# Patient Record
Sex: Male | Born: 1965
Health system: Southern US, Community
[De-identification: ages and names within clinical notes are randomized; demographics above are authoritative.]

## PROBLEM LIST (undated history)

## (undated) DIAGNOSIS — C7A8 Other malignant neuroendocrine tumors: Secondary | ICD-10-CM

## (undated) DIAGNOSIS — I219 Acute myocardial infarction, unspecified: Secondary | ICD-10-CM

## (undated) DIAGNOSIS — I2510 Atherosclerotic heart disease of native coronary artery without angina pectoris: Secondary | ICD-10-CM

## (undated) DIAGNOSIS — I10 Essential (primary) hypertension: Secondary | ICD-10-CM

## (undated) DIAGNOSIS — E785 Hyperlipidemia, unspecified: Secondary | ICD-10-CM

## (undated) HISTORY — PX: WISDOM TOOTH EXTRACTION: SHX21

## (undated) HISTORY — PX: TONSILLECTOMY: SUR1361

---
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
## 2007-06-05 HISTORY — PX: KNEE SURGERY: SHX244

## 2018-05-12 DIAGNOSIS — D485 Neoplasm of uncertain behavior of skin: Secondary | ICD-10-CM | POA: Diagnosis not present

## 2018-05-21 DIAGNOSIS — R198 Other specified symptoms and signs involving the digestive system and abdomen: Secondary | ICD-10-CM | POA: Diagnosis not present

## 2018-05-21 DIAGNOSIS — R140 Abdominal distension (gaseous): Secondary | ICD-10-CM | POA: Diagnosis not present

## 2018-06-05 ENCOUNTER — Emergency Department (HOSPITAL_COMMUNITY): Payer: Federal, State, Local not specified - PPO

## 2018-06-05 ENCOUNTER — Encounter (HOSPITAL_COMMUNITY): Payer: Self-pay | Admitting: Cardiology

## 2018-06-05 ENCOUNTER — Other Ambulatory Visit: Payer: Self-pay

## 2018-06-05 ENCOUNTER — Inpatient Hospital Stay (HOSPITAL_COMMUNITY)
Admission: EM | Admit: 2018-06-05 | Discharge: 2018-06-10 | DRG: 246 | Disposition: A | Discharge status: Home / Self Care | Payer: Federal, State, Local not specified - PPO | Attending: Cardiology | Admitting: Cardiology

## 2018-06-05 DIAGNOSIS — Z9861 Coronary angioplasty status: Secondary | ICD-10-CM

## 2018-06-05 DIAGNOSIS — I25118 Atherosclerotic heart disease of native coronary artery with other forms of angina pectoris: Secondary | ICD-10-CM | POA: Diagnosis present

## 2018-06-05 DIAGNOSIS — N183 Chronic kidney disease, stage 3 (moderate): Secondary | ICD-10-CM | POA: Diagnosis not present

## 2018-06-05 DIAGNOSIS — Z8249 Family history of ischemic heart disease and other diseases of the circulatory system: Secondary | ICD-10-CM | POA: Diagnosis not present

## 2018-06-05 DIAGNOSIS — Z833 Family history of diabetes mellitus: Secondary | ICD-10-CM

## 2018-06-05 DIAGNOSIS — E785 Hyperlipidemia, unspecified: Secondary | ICD-10-CM | POA: Diagnosis present

## 2018-06-05 DIAGNOSIS — I10 Essential (primary) hypertension: Secondary | ICD-10-CM | POA: Diagnosis present

## 2018-06-05 DIAGNOSIS — I214 Non-ST elevation (NSTEMI) myocardial infarction: Secondary | ICD-10-CM

## 2018-06-05 DIAGNOSIS — Z880 Allergy status to penicillin: Secondary | ICD-10-CM | POA: Diagnosis not present

## 2018-06-05 DIAGNOSIS — Z79899 Other long term (current) drug therapy: Secondary | ICD-10-CM

## 2018-06-05 DIAGNOSIS — R740 Nonspecific elevation of levels of transaminase and lactic acid dehydrogenase [LDH]: Secondary | ICD-10-CM | POA: Diagnosis not present

## 2018-06-05 DIAGNOSIS — I129 Hypertensive chronic kidney disease with stage 1 through stage 4 chronic kidney disease, or unspecified chronic kidney disease: Secondary | ICD-10-CM | POA: Diagnosis present

## 2018-06-05 DIAGNOSIS — N179 Acute kidney failure, unspecified: Secondary | ICD-10-CM | POA: Diagnosis not present

## 2018-06-05 DIAGNOSIS — R748 Abnormal levels of other serum enzymes: Secondary | ICD-10-CM | POA: Diagnosis not present

## 2018-06-05 DIAGNOSIS — Z955 Presence of coronary angioplasty implant and graft: Secondary | ICD-10-CM

## 2018-06-05 DIAGNOSIS — R945 Abnormal results of liver function studies: Secondary | ICD-10-CM | POA: Diagnosis not present

## 2018-06-05 DIAGNOSIS — I2510 Atherosclerotic heart disease of native coronary artery without angina pectoris: Secondary | ICD-10-CM | POA: Diagnosis not present

## 2018-06-05 DIAGNOSIS — R918 Other nonspecific abnormal finding of lung field: Secondary | ICD-10-CM | POA: Diagnosis not present

## 2018-06-05 HISTORY — DX: Acute myocardial infarction, unspecified: I21.9

## 2018-06-05 HISTORY — DX: Hyperlipidemia, unspecified: E78.5

## 2018-06-05 HISTORY — DX: Essential (primary) hypertension: I10

## 2018-06-05 HISTORY — DX: Atherosclerotic heart disease of native coronary artery without angina pectoris: I25.10

## 2018-06-05 LAB — CBC
HCT: 45.7 % (ref 39.0–52.0)
Hemoglobin: 15.7 g/dL (ref 13.0–17.0)
MCH: 31.2 pg (ref 26.0–34.0)
MCHC: 34.4 g/dL (ref 30.0–36.0)
MCV: 90.9 fL (ref 80.0–100.0)
Platelets: 237 10*3/uL (ref 150–400)
RBC: 5.03 MIL/uL (ref 4.22–5.81)
RDW: 12.6 % (ref 11.5–15.5)
WBC: 8.3 10*3/uL (ref 4.0–10.5)
nRBC: 0 % (ref 0.0–0.2)

## 2018-06-05 LAB — HEPATIC FUNCTION PANEL
ALT: 48 U/L — ABNORMAL HIGH (ref 0–44)
AST: 62 U/L — ABNORMAL HIGH (ref 15–41)
Albumin: 4.7 g/dL (ref 3.5–5.0)
Alkaline Phosphatase: 44 U/L (ref 38–126)
Bilirubin, Direct: 0.2 mg/dL (ref 0.0–0.2)
Indirect Bilirubin: 0.5 mg/dL (ref 0.3–0.9)
Total Bilirubin: 0.7 mg/dL (ref 0.3–1.2)
Total Protein: 8.3 g/dL — ABNORMAL HIGH (ref 6.5–8.1)

## 2018-06-05 LAB — BASIC METABOLIC PANEL
Anion gap: 12 (ref 5–15)
BUN: 19 mg/dL (ref 6–20)
CO2: 24 mmol/L (ref 22–32)
Calcium: 9.6 mg/dL (ref 8.9–10.3)
Chloride: 101 mmol/L (ref 98–111)
Creatinine, Ser: 1.51 mg/dL — ABNORMAL HIGH (ref 0.61–1.24)
GFR calc Af Amer: 60 mL/min — ABNORMAL LOW (ref >60)
GFR calc non Af Amer: 52 mL/min — ABNORMAL LOW (ref >60)
Glucose, Bld: 96 mg/dL (ref 70–99)
Potassium: 4.1 mmol/L (ref 3.5–5.1)
SODIUM: 137 mmol/L (ref 135–145)

## 2018-06-05 LAB — APTT: aPTT: 35 seconds (ref 24–36)

## 2018-06-05 LAB — LIPASE, BLOOD: Lipase: 64 U/L — ABNORMAL HIGH (ref 11–51)

## 2018-06-05 LAB — I-STAT TROPONIN, ED: Troponin i, poc: 0.11 ng/mL (ref 0.00–0.08)

## 2018-06-05 LAB — PROTIME-INR
INR: 0.84
Prothrombin Time: 11.4 seconds (ref 11.4–15.2)

## 2018-06-05 LAB — HEPARIN LEVEL (UNFRACTIONATED): Heparin Unfractionated: 0.32 IU/mL (ref 0.30–0.70)

## 2018-06-05 IMAGING — CR DG CHEST 2V
2 series · 2 of 2 positions shown · non-contrast
Comparison: None.

CLINICAL DATA: Onset of mid sternal chest discomfort 5 6 days ago
associated with decreased oral intake. Symptoms are made worse with
exertion. There is no shortness of breath.

EXAM:
CHEST - 2 VIEW

[w chest pa]
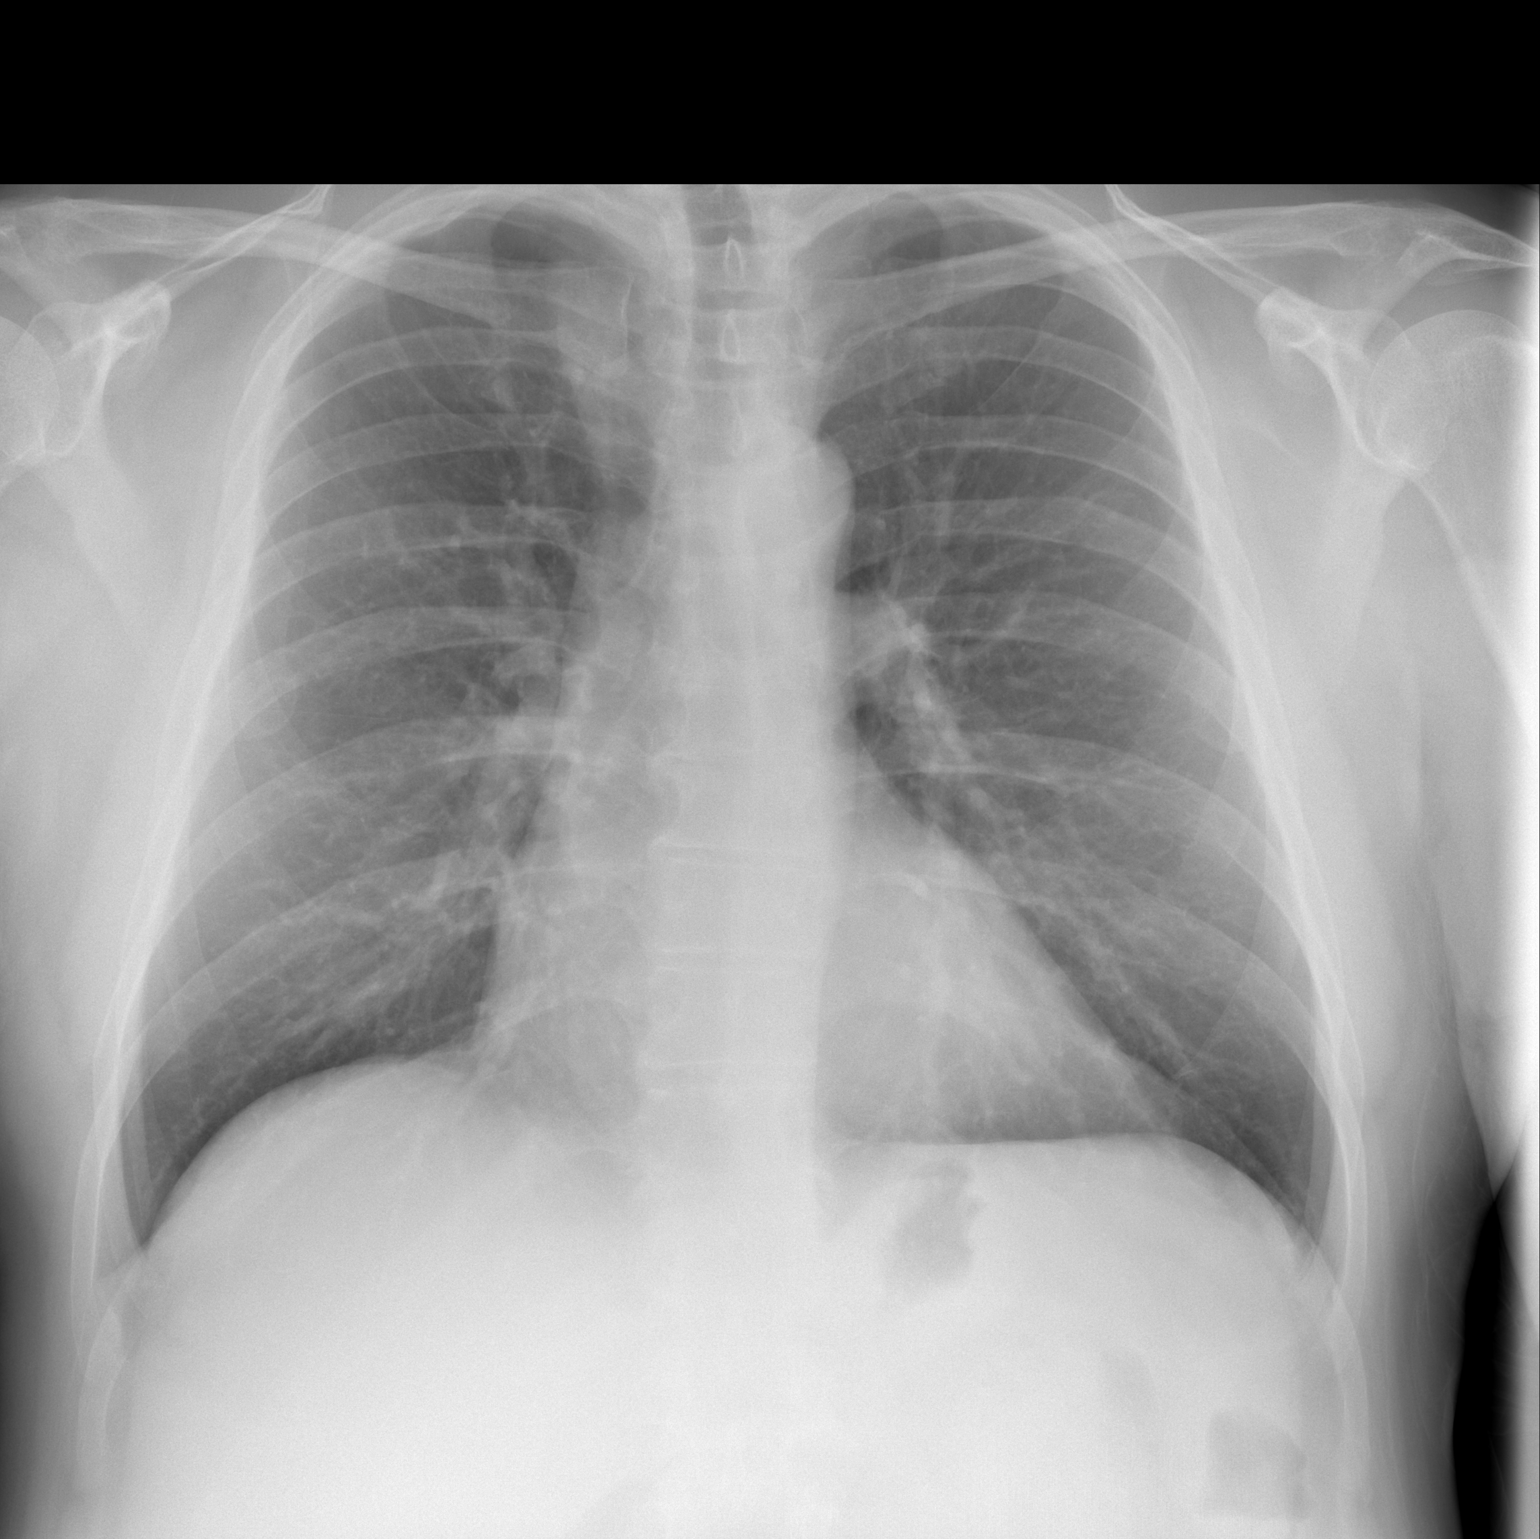

[w chest lat]
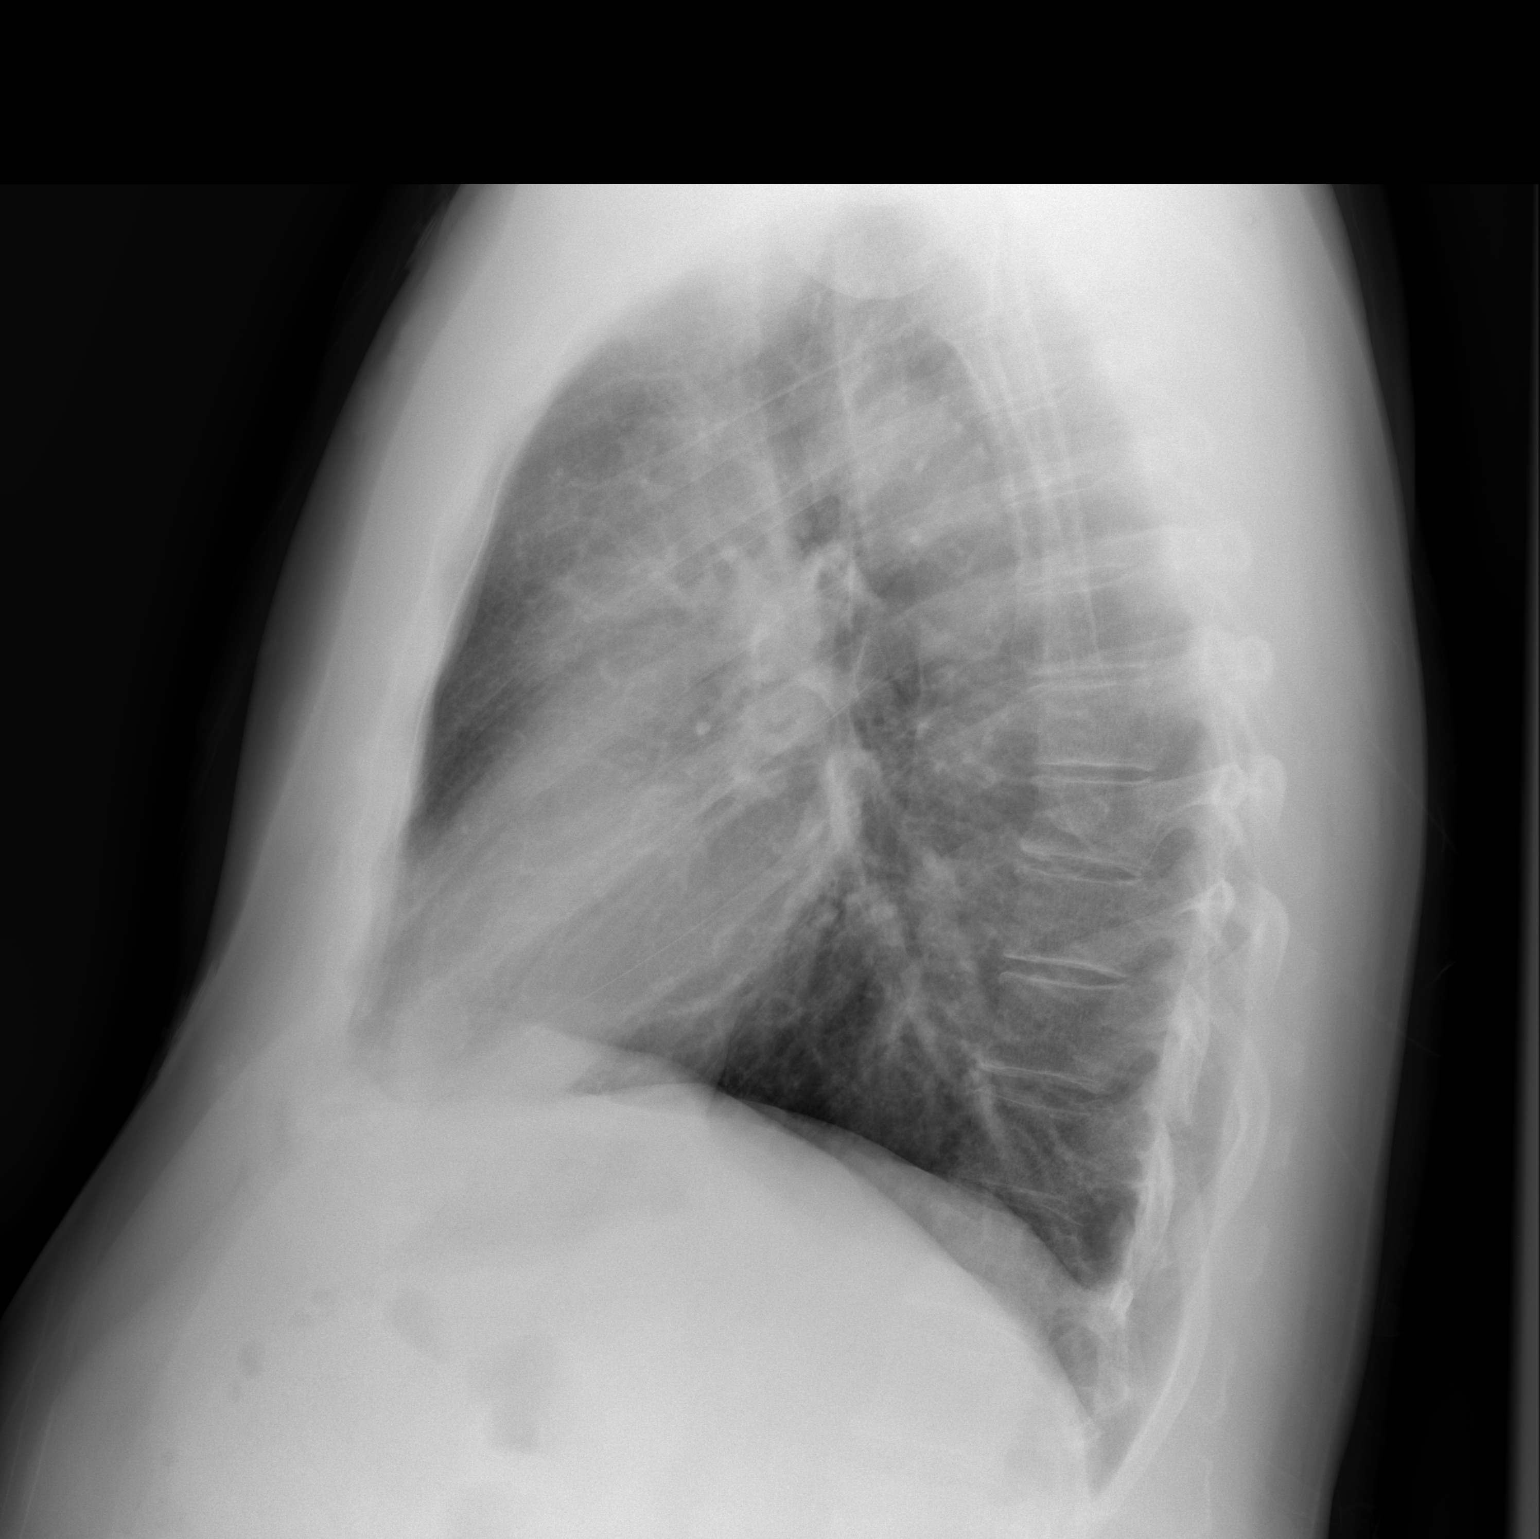

[2 of 2 positions shown; findings below may reference images not displayed]

FINDINGS: The lungs are well-expanded. There is no focal infiltrate. There is
no pleural effusion. The heart and pulmonary vascularity are normal.
The mediastinum is normal in width. The bony thorax exhibits no
acute abnormality.
IMPRESSION: Mild hyperinflation consistent with reactive airway disease or COPD.
No alveolar pneumonia nor CHF.

## 2018-06-05 MED ORDER — TICAGRELOR 90 MG PO TABS
180.0000 mg | ORAL_TABLET | Freq: Once | ORAL | Status: AC
  Administered 2018-06-05: 180 mg via ORAL
  Filled 2018-06-05: qty 2

## 2018-06-05 MED ORDER — SODIUM CHLORIDE 0.9 % IV SOLN: 250.0000 mL | INTRAVENOUS | Status: DC | PRN

## 2018-06-05 MED ORDER — HEPARIN (PORCINE) 25000 UT/250ML-% IV SOLN
1200.0000 [IU]/h | INTRAVENOUS | Status: DC
  Administered 2018-06-05 – 2018-06-06 (×2): 1200 [IU]/h via INTRAVENOUS
  Filled 2018-06-05 (×2): qty 250

## 2018-06-05 MED ORDER — HEPARIN BOLUS VIA INFUSION
4000.0000 [IU] | Freq: Once | INTRAVENOUS | Status: AC
  Administered 2018-06-05: 4000 [IU] via INTRAVENOUS
  Filled 2018-06-05: qty 4000

## 2018-06-05 MED ORDER — TICAGRELOR 90 MG PO TABS
90.0000 mg | ORAL_TABLET | Freq: Two times a day (BID) | ORAL | Status: DC
  Administered 2018-06-06 – 2018-06-10 (×8): 90 mg via ORAL
  Filled 2018-06-05 (×8): qty 1

## 2018-06-05 MED ORDER — ASPIRIN 81 MG PO CHEW
81.0000 mg | CHEWABLE_TABLET | Freq: Every day | ORAL | Status: DC
  Administered 2018-06-06 – 2018-06-10 (×5): 81 mg via ORAL
  Filled 2018-06-05 (×5): qty 1

## 2018-06-05 MED ORDER — NITROGLYCERIN 0.4 MG SL SUBL
0.4000 mg | SUBLINGUAL_TABLET | SUBLINGUAL | Status: AC | PRN
  Administered 2018-06-05 (×3): 0.4 mg via SUBLINGUAL
  Filled 2018-06-05: qty 1

## 2018-06-05 MED ORDER — ONDANSETRON HCL 4 MG/2ML IJ SOLN: 4.0000 mg | Freq: Four times a day (QID) | INTRAMUSCULAR | Status: DC | PRN

## 2018-06-05 MED ORDER — ASPIRIN 81 MG PO CHEW
81.0000 mg | CHEWABLE_TABLET | ORAL | Status: AC
  Administered 2018-06-06: 81 mg via ORAL
  Filled 2018-06-05: qty 1

## 2018-06-05 MED ORDER — AMLODIPINE BESYLATE 5 MG PO TABS
5.0000 mg | ORAL_TABLET | Freq: Every day | ORAL | Status: DC
  Administered 2018-06-05 – 2018-06-10 (×6): 5 mg via ORAL
  Filled 2018-06-05 (×6): qty 1

## 2018-06-05 MED ORDER — NITROGLYCERIN 0.4 MG SL SUBL: 0.4000 mg | SUBLINGUAL_TABLET | SUBLINGUAL | Status: DC | PRN

## 2018-06-05 MED ORDER — METOPROLOL TARTRATE 25 MG PO TABS
25.0000 mg | ORAL_TABLET | Freq: Two times a day (BID) | ORAL | Status: DC
  Administered 2018-06-05 – 2018-06-09 (×9): 25 mg via ORAL
  Filled 2018-06-05 (×9): qty 1

## 2018-06-05 MED ORDER — SODIUM CHLORIDE 0.9 % IV SOLN: INTRAVENOUS | Status: DC

## 2018-06-05 MED ORDER — ASPIRIN 325 MG PO TABS
325.0000 mg | ORAL_TABLET | Freq: Once | ORAL | Status: AC
  Administered 2018-06-05: 325 mg via ORAL
  Filled 2018-06-05: qty 1

## 2018-06-05 MED ORDER — SODIUM CHLORIDE 0.9% FLUSH: 3.0000 mL | INTRAVENOUS | Status: DC | PRN

## 2018-06-05 MED ORDER — SODIUM CHLORIDE 0.9 % IV SOLN
INTRAVENOUS | Status: AC
  Administered 2018-06-05 – 2018-06-06 (×3): via INTRAVENOUS

## 2018-06-05 MED ORDER — ACETAMINOPHEN 325 MG PO TABS: 650.0000 mg | ORAL_TABLET | ORAL | Status: DC | PRN

## 2018-06-05 MED ORDER — ATORVASTATIN CALCIUM 40 MG PO TABS
80.0000 mg | ORAL_TABLET | Freq: Every day | ORAL | Status: DC
  Administered 2018-06-05 – 2018-06-09 (×4): 80 mg via ORAL
  Filled 2018-06-05 (×5): qty 2

## 2018-06-05 MED ORDER — SODIUM CHLORIDE 0.9% FLUSH
3.0000 mL | Freq: Two times a day (BID) | INTRAVENOUS | Status: DC
  Administered 2018-06-05 – 2018-06-06 (×2): 3 mL via INTRAVENOUS

## 2018-06-05 MED ORDER — TICAGRELOR 90 MG PO TABS
180.0000 mg | ORAL_TABLET | Freq: Once | ORAL | Status: DC
  Filled 2018-06-05: qty 2

## 2018-06-05 MED ORDER — METOPROLOL TARTRATE 25 MG PO TABS: 12.5000 mg | ORAL_TABLET | Freq: Two times a day (BID) | ORAL | Status: DC

## 2018-06-05 NOTE — H&P (Addendum)
William Hancock is an 53 y.o. male.   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  Patient has been having progressive exertional angina for loss few days, worsened today enough to present to the emergency room.  Pain improves with rest.  There is no associated shortness of breath, palpitations, presyncope or syncope.  Also denies orthopnea, PND, leg edema.  Patient is on amlodipine/benazepril for hypertension, not on any lipid-lowering therapy.  There is recent history of dyspepsia and diarrhea for which he was seen by GI and recommended a course of metronidazole.  GI symptoms are currently resolved.  No history of melena.  Patient used exercise regularly up until 8 months ago.  Patient recently moved from [facility] and has not been regularly exercising in the recent past.  He is here today with his wife and the.  He has 2 children 19, 22.   Past Medical History:  Diagnosis Date  . Hyperlipidemia   . Hypertension       Family History  Problem Relation Age of Onset  . Diabetes Father   . Coronary artery disease Paternal Grandfather        Died in 30s   Social History:  has no history on file for tobacco, alcohol, and drug.  Allergies:  Allergies  Allergen Reactions  . Penicillins     DID THE REACTION INVOLVE: Swelling of the face/tongue/throat, SOB, or low BP? N Sudden or severe rash/hives, skin peeling, or the inside of the mouth or nose? N Did it require medical treatment? N When did it last happen? If all above answers are "NO", may proceed with cephalosporin use.     Medications Prior to Admission  Medication Sig Dispense Refill  . amLODipine-benazepril (LOTREL) 5-10 MG capsule Take 1 capsule by mouth daily.      Results for orders placed or performed during the hospital encounter of 06/05/18 (from the  past 48 hour(s))  Basic metabolic panel     Status: Abnormal   Collection Time: 06/05/18  3:53 PM  Result Value Ref Range   Sodium 137 135 - 145 mmol/L   Potassium 4.1 3.5 - 5.1 mmol/L   Chloride 101 98 - 111 mmol/L   CO2 24 22 - 32 mmol/L   Glucose, Bld 96 70 - 99 mg/dL   BUN 19 6 - 20 mg/dL   Creatinine, Ser 1.51 (H) 0.61 - 1.24 mg/dL   Calcium 9.6 8.9 - 10.3 mg/dL   GFR calc non Af Amer 52 (L) >60 mL/min   GFR calc Af Amer 60 (L) >60 mL/min   Anion gap 12 5 - 15    Comment: Performed at [facility], [place] [address], [place], [place] [address]  CBC     Status: None   Collection Time: 06/05/18  3:53 PM  Result Value Ref Range   WBC 8.3 4.0 - 10.5 K/uL   RBC 5.03 4.22 - 5.81 MIL/uL   Hemoglobin 15.7 13.0 - 17.0 g/dL   HCT 45.7 39.0 - 52.0 %   MCV 90.9 80.0 - 100.0 fL   MCH 31.2 26.0 - 34.0 pg   MCHC 34.4 30.0 - 36.0 g/dL   RDW 12.6 11.5 - 15.5 %   Platelets 237 150 - 400 K/uL   nRBC 0.0 0.0 - 0.2 %    Comment: Performed at [facility], [place] [address]  [place], [place] [address]  Hepatic function panel     Status: Abnormal   Collection Time: 06/05/18  3:53 PM  Result Value Ref Range   Total Protein 8.3 (H) 6.5 - 8.1 g/dL   Albumin 4.7 3.5 - 5.0 g/dL   AST 62 (H) 15 - 41 U/L   ALT 48 (H) 0 - 44 U/L   Alkaline Phosphatase 44 38 - 126 U/L   Total Bilirubin 0.7 0.3 - 1.2 mg/dL   Bilirubin, Direct 0.2 0.0 - 0.2 mg/dL   Indirect Bilirubin 0.5 0.3 - 0.9 mg/dL    Comment: Performed at [facility], [place] [address] [place], [place] [address]  Lipase, blood     Status: Abnormal   Collection Time: 06/05/18  3:53 PM  Result Value Ref Range   Lipase 64 (H) 11 - 51 U/L    Comment: Performed at [facility], [place] [address] [place], [place] [address]  I-stat troponin, ED     Status: Abnormal   Collection Time: 06/05/18  3:58 PM  Result Value Ref Range   Troponin i, poc 0.11 (HH) 0.00 - 0.08 ng/mL   Comment  NOTIFIED PHYSICIAN    Comment 3            Comment: Due to the release kinetics of cTnI, a negative result within the first hours of the onset of symptoms does not rule out myocardial infarction with certainty. If myocardial infarction is still suspected, repeat the test at appropriate intervals.   APTT     Status: None   Collection Time: 06/05/18  5:00 PM  Result Value Ref Range   aPTT 35 24 - 36 seconds    Comment: Performed at [facility], [place] [address], [place], [place] [address]  Protime-INR     Status: None   Collection Time: 06/05/18  5:00 PM  Result Value Ref Range   Prothrombin Time 11.4 11.4 - 15.2 seconds   INR 0.84     Comment: Performed at [facility], [place] [address] [place], [place] [address]   Dg Chest 2 View  Result Date: 06/05/2018 CLINICAL DATA:  Onset of mid sternal chest discomfort 5 6 days ago associated with decreased oral intake. Symptoms are made worse with exertion. There is no shortness of breath. EXAM: CHEST - 2 VIEW COMPARISON:  None. FINDINGS: The lungs are well-expanded. There is no focal infiltrate. There is no pleural effusion. The heart and pulmonary vascularity are normal. The mediastinum is normal in width. The bony thorax exhibits no acute abnormality. IMPRESSION: Mild hyperinflation consistent with reactive airway disease or COPD. No alveolar pneumonia nor CHF. Electronically Signed   By: David  Martinique M.D.   On: 06/05/2018 16:30    Review of Systems  Constitutional: Negative.   Respiratory: Negative for shortness of breath.   Cardiovascular: Positive for chest pain. Negative for palpitations, orthopnea, claudication, leg swelling and PND.  Gastrointestinal: Negative for diarrhea, melena, nausea and vomiting.  Genitourinary: Negative.   Musculoskeletal: Negative.   Skin: Negative.   Neurological: Negative.  Negative for loss of consciousness.  Endo/Heme/Allergies: Does not bruise/bleed easily.   Psychiatric/Behavioral: Negative.   All other systems reviewed and are negative.   Blood pressure (!) 152/110, pulse 65, temperature 97.6 F (36.4 C), temperature source Oral, resp. rate (!) 9, height 5\' 11"  (1.803 m), weight 99.8 kg, SpO2 100 %. Physical Exam  Nursing note and vitals reviewed. Constitutional: He is oriented to person, place, and time.  He appears well-developed and well-nourished.  HENT:  Head: Normocephalic and atraumatic.  Eyes: Pupils are equal, round, and reactive to light. Conjunctivae are normal.  Neck: No JVD present.  Cardiovascular: Normal rate, regular rhythm, normal heart sounds and intact distal pulses.  No murmur heard. Respiratory: Effort normal and breath sounds normal. No respiratory distress. He has no wheezes.  GI: Soft. Bowel sounds are normal. He exhibits no distension. There is no abdominal tenderness.  Musculoskeletal:        General: No edema.  Lymphadenopathy:    He has no cervical adenopathy.  Neurological: He is alert and oriented to person, place, and time.  Skin: Skin is warm and dry.  Psychiatric: He has a normal mood and affect.    Cardiac studies: EKG 06/05/2018: Sinus rhythm 68 bpm.  Normal axis.  Normal conduction.  Nonspecific ST-T changes inferior leads.  Low-voltage precordial leads.  Poor R-wave progression.  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  Non-STEMI: No acute ischemic changes.  Hemodynamically and electrically stable.  Stat troponin 0.11 ng/mL Admitted to telemetry.  Recommend dual antiplatelet therapy with aspirin & Brilinta, IV heparin. Lipitor 80 mg, metoprolol 25 mg twice daily. Hold amlodipine-benazepril given CKD 3.  Recommend amlodipine 5 mg daily. Plan for cath 01/03 afternoon. Okay to eat breakfast, NPO after that.  Discussed revascularization options with the patient.   Patient would prefer percutaneous revascularization unless anatomy not suitable or high syntax score. Check lipid panel and hemoglobin A1C in am Echocardiogram in am.  ?AKI/CKD: Baseline creatinine not known.  Recommend overnight IV hydration.   Nigel Mormon, MD 06/05/2018, 9:29 PM  Moira Umholtz Esther Hardy, MD [facility]. [place] Pager: [phone] Office: [phone] If no answer Cell [phone]

## 2018-06-05 NOTE — ED Notes (Signed)
Bed: MV36 Expected date:  Expected time:  Means of arrival:  Comments: Hold for [facility]

## 2018-06-05 NOTE — ED Notes (Signed)
RN aware of pts elevated BP 

## 2018-06-05 NOTE — ED Triage Notes (Signed)
Onset of mid-sternal, non-radiating chest pain that started 5-6 days ago. + decrease in PO intake. Denies any shortness of breath, cough, n/v however states that the pain worsens with exertion. Pain currently 3/10.

## 2018-06-05 NOTE — Progress Notes (Signed)
ANTICOAGULATION CONSULT NOTE - Initial Consult  Pharmacy Consult for heparin Indication: chest pain/ACS  Allergies  Allergen Reactions  . Penicillins     DID THE REACTION INVOLVE: Swelling of the face/tongue/throat, SOB, or low BP? N Sudden or severe rash/hives, skin peeling, or the inside of the mouth or nose? N Did it require medical treatment? N When did it last happen? If all above answers are "NO", may proceed with cephalosporin use.     Patient Measurements: Height: 5\' 11"  (180.3 cm) Weight: 220 lb (99.8 kg) IBW/kg (Calculated) : 75.3 Heparin Dosing Weight: 95.8 kg  Vital Signs: Temp: 97.7 F (36.5 C) (01/02 1532) Temp Source: Oral (01/02 1532) BP: 149/110 (01/02 1709) Pulse Rate: 62 (01/02 1709)  Labs: Recent Labs    06/05/18 1553  HGB 15.7  HCT 45.7  PLT 237  CREATININE 1.51*    Estimated Creatinine Clearance: 67.3 mL/min (A) (by C-G formula based on SCr of 1.51 mg/dL (H)).   Medical History: No past medical history on file.  Medications:  Not on any anticoagulants PTA. Confirmed with patient.  Assessment: Pt is a 53 year old male presenting with chest pain, nausea. Pharmacy has been consulted to dose/monitor heparin drip for ACS.  Baseline Hgb: 15.7 Baseline Plt: 237  Today, 06/05/18  CBC - WNL  Baseline aPTT and protime/INR ordered STAT  SCr 1.5, CrCl ~67 mL/min  Troponin 0.11  Goal of Therapy:  Heparin level 0.3-0.7 units/ml Monitor platelets by anticoagulation protocol: Yes   Plan:   Give 4000 units bolus x 1  Start heparin infusion at 1200 units/hr (~12.5 units/kg/hr)  Check HL level in 6 hours  CBC and HL daily  Continue to monitor H&H and platelets  Monitor for any signs/symptoms of bleeding  Lenis Noon, PharmD Clinical Pharmacist 06/05/2018,5:12 PM

## 2018-06-05 NOTE — ED Provider Notes (Signed)
[facility] DEPT Provider Note   CSN: [id number] Arrival date & time: 06/05/18  1515     History   Chief Complaint Chief Complaint  Patient presents with  . Chest Pain    HPI William Hancock is a 53 y.o. male otherwise healthy here with chest pain, nausea, reflux symptoms. Patient states that he had reflux symptoms for several months. Saw Dr. Watt Climes from GI several weeks ago and finished a course of flagyl about a week ago.  Patient states that for the last week, he had worsening reflux symptoms.  He describes it as some chest pressure that is worsening when he exerts himself.  For example, when he walks the dog he feels very short of breath.  Today he was shopping at [facility] and felt very short of breath and has substernal chest pain so came to the ED for evaluation.  Patient has no known CAD.  States that several years ago he had some chest pain and went to a hospital in [place] and had a negative stress test.  He has no cardiologist currently.  States that he has a history of hypertension and is compliant with his blood pressure medicines.  The history is provided by the patient.    No past medical history on file.  Patient Active Problem List   Diagnosis Date Noted  . NSTEMI (non-ST elevated myocardial infarction) ([place] [facility]) 06/05/2018      Home Medications    Prior to Admission medications   Medication Sig Start Date End Date Taking? Authorizing Provider  amLODipine-benazepril (LOTREL) 5-10 MG capsule Take 1 capsule by mouth daily.   Yes [provider]    Family History No family history on file.  Social History Social History   Tobacco Use  . Smoking status: Not on file  Substance Use Topics  . Alcohol use: Not on file  . Drug use: Not on file     Allergies   Penicillins   Review of Systems Review of Systems  Cardiovascular: Positive for chest pain.  All other systems reviewed and are negative.    Physical  Exam Updated Vital Signs BP (!) 141/94   Pulse 80   Temp 97.7 F (36.5 C) (Oral)   Resp 13   Ht 5\' 11"  (1.803 m)   Wt 99.8 kg   SpO2 99%   BMI 30.68 kg/m   Physical Exam Vitals signs and nursing note reviewed.  Constitutional:      Appearance: He is well-developed.  HENT:     Head: Normocephalic.  Eyes:     Extraocular Movements: Extraocular movements intact.     Pupils: Pupils are equal, round, and reactive to light.  Neck:     Musculoskeletal: Normal range of motion and neck supple.  Cardiovascular:     Rate and Rhythm: Normal rate and regular rhythm.     Heart sounds: Normal heart sounds.  Pulmonary:     Breath sounds: Normal breath sounds.  Chest:     Chest wall: No deformity.  Abdominal:     General: Bowel sounds are normal.     Palpations: Abdomen is soft.  Musculoskeletal: Normal range of motion.  Skin:    General: Skin is warm.     Capillary Refill: Capillary refill takes less than 2 seconds.  Neurological:     General: No focal deficit present.     Mental Status: He is alert.  Psychiatric:        Mood and Affect: Mood normal.  Behavior: Behavior normal.      ED Treatments / Results  Labs (all labs ordered are listed, but only abnormal results are displayed) Labs Reviewed  BASIC METABOLIC PANEL - Abnormal; Notable for the following components:      Result Value   Creatinine, Ser 1.51 (*)    GFR calc non Af Amer 52 (*)    GFR calc Af Amer 60 (*)    All other components within normal limits  HEPATIC FUNCTION PANEL - Abnormal; Notable for the following components:   Total Protein 8.3 (*)    AST 62 (*)    ALT 48 (*)    All other components within normal limits  LIPASE, BLOOD - Abnormal; Notable for the following components:   Lipase 64 (*)    All other components within normal limits  I-STAT TROPONIN, ED - Abnormal; Notable for the following components:   Troponin i, poc 0.11 (*)    All other components within normal limits  CBC  APTT   PROTIME-INR  HEPARIN LEVEL (UNFRACTIONATED)  CBC    EKG EKG Interpretation  Date/Time:  Thursday June 05 2018 16:32:28 EST Ventricular Rate:  68 PR Interval:    QRS Duration: 100 QT Interval:  410 QTC Calculation: 436 R Axis:   24 Text Interpretation:  Sinus rhythm Low voltage, precordial leads No significant change since last tracing Confirmed by Wandra Arthurs [phone]) on 06/05/2018 4:35:13 PM   Radiology Dg Chest 2 View  Result Date: 06/05/2018 CLINICAL DATA:  Onset of mid sternal chest discomfort 5 6 days ago associated with decreased oral intake. Symptoms are made worse with exertion. There is no shortness of breath. EXAM: CHEST - 2 VIEW COMPARISON:  None. FINDINGS: The lungs are well-expanded. There is no focal infiltrate. There is no pleural effusion. The heart and pulmonary vascularity are normal. The mediastinum is normal in width. The bony thorax exhibits no acute abnormality. IMPRESSION: Mild hyperinflation consistent with reactive airway disease or COPD. No alveolar pneumonia nor CHF. Electronically Signed   By: Tishia Maestre  Martinique M.D.   On: 06/05/2018 16:30    Procedures Procedures (including critical care time)  CRITICAL CARE Performed by: Wandra Arthurs   Total critical care time: 30 minutes  Critical care time was exclusive of separately billable procedures and treating other patients.  Critical care was necessary to treat or prevent imminent or life-threatening deterioration.  Critical care was time spent personally by me on the following activities: development of treatment plan with patient and/or surrogate as well as nursing, discussions with consultants, evaluation of patient's response to treatment, examination of patient, obtaining history from patient or surrogate, ordering and performing treatments and interventions, ordering and review of laboratory studies, ordering and review of radiographic studies, pulse oximetry and re-evaluation of patient's  condition.   Medications Ordered in ED Medications  nitroGLYCERIN (NITROSTAT) SL tablet 0.4 mg (0.4 mg Sublingual Given 06/05/18 1729)  metoprolol tartrate (LOPRESSOR) tablet 12.5 mg (has no administration in time range)  heparin ADULT infusion 100 units/mL (25000 units/264mL sodium chloride 0.45%) (1,200 Units/hr Intravenous New Bag/Given 06/05/18 1747)  aspirin tablet 325 mg (325 mg Oral Given 06/05/18 1712)  heparin bolus via infusion 4,000 Units (4,000 Units Intravenous Bolus from Bag 06/05/18 1745)     Initial Impression / Assessment and Plan / ED Course  I have reviewed the triage vital signs and the nursing notes.  Pertinent labs & imaging results that were available during my care of the patient were reviewed by me  and considered in my medical decision making (see chart for details).     William Hancock is a 53 y.o. male here with chest pain. He states that it started as reflux symptoms but became exertional for the last week. He has Q waves already and I am concerned for possible NSTEMI. Will get labs, trop, CXR. I doubt PE or dissection.   5 pm Trop positive at 0.11. Given ASA, nitro, pain free. I talked to Dr. Virgina Jock, cardiologist on call. He recommend heparin drip, transfer to [facility]. He will see patient at [facility] and schedule for cardiac catheterization tomorrow. Will admit for NSTEMI.    Final Clinical Impressions(s) / ED Diagnoses   Final diagnoses:  NSTEMI (non-ST elevated myocardial infarction) [facility])    ED Discharge Orders    None       Drenda Freeze, MD 06/05/18 1757

## 2018-06-05 NOTE — ED Notes (Signed)
[facility] called for patient transport to [facility]. Report called to RN on [place]. Patient and wife updated on POC.

## 2018-06-06 ENCOUNTER — Inpatient Hospital Stay (HOSPITAL_COMMUNITY): Payer: Federal, State, Local not specified - PPO

## 2018-06-06 ENCOUNTER — Other Ambulatory Visit: Payer: Self-pay

## 2018-06-06 ENCOUNTER — Inpatient Hospital Stay (HOSPITAL_COMMUNITY)
Admission: EM | Disposition: A | Discharge status: Home / Self Care | Payer: Self-pay | Source: Home / Self Care | Attending: Cardiology

## 2018-06-06 HISTORY — PX: CORONARY ULTRASOUND/IVUS: CATH118244

## 2018-06-06 HISTORY — PX: CORONARY STENT INTERVENTION: CATH118234

## 2018-06-06 HISTORY — PX: LEFT HEART CATH AND CORONARY ANGIOGRAPHY: CATH118249

## 2018-06-06 LAB — BASIC METABOLIC PANEL
Anion gap: 11 (ref 5–15)
Anion gap: 7 (ref 5–15)
BUN: 16 mg/dL (ref 6–20)
BUN: 15 mg/dL (ref 6–20)
CHLORIDE: 108 mmol/L (ref 98–111)
CO2: 27 mmol/L (ref 22–32)
CO2: 24 mmol/L (ref 22–32)
Calcium: 9.7 mg/dL (ref 8.9–10.3)
Calcium: 8.9 mg/dL (ref 8.9–10.3)
Chloride: 100 mmol/L (ref 98–111)
Creatinine, Ser: 1.65 mg/dL — ABNORMAL HIGH (ref 0.61–1.24)
Creatinine, Ser: 1.56 mg/dL — ABNORMAL HIGH (ref 0.61–1.24)
GFR calc Af Amer: 54 mL/min — ABNORMAL LOW (ref >60)
GFR calc Af Amer: 58 mL/min — ABNORMAL LOW (ref >60)
GFR calc non Af Amer: 46 mL/min — ABNORMAL LOW (ref >60)
GFR calc non Af Amer: 50 mL/min — ABNORMAL LOW (ref >60)
Glucose, Bld: 109 mg/dL — ABNORMAL HIGH (ref 70–99)
Glucose, Bld: 108 mg/dL — ABNORMAL HIGH (ref 70–99)
Potassium: 3.9 mmol/L (ref 3.5–5.1)
Potassium: 3.7 mmol/L (ref 3.5–5.1)
SODIUM: 139 mmol/L (ref 135–145)
Sodium: 138 mmol/L (ref 135–145)

## 2018-06-06 LAB — ECHOCARDIOGRAM COMPLETE
Height: 71 in
Weight: 3486.4 oz

## 2018-06-06 LAB — HIV ANTIBODY (ROUTINE TESTING W REFLEX): HIV Screen 4th Generation wRfx: NONREACTIVE

## 2018-06-06 LAB — CBC
HEMATOCRIT: 43.5 % (ref 39.0–52.0)
Hemoglobin: 14.9 g/dL (ref 13.0–17.0)
MCH: 31.1 pg (ref 26.0–34.0)
MCHC: 34.3 g/dL (ref 30.0–36.0)
MCV: 90.8 fL (ref 80.0–100.0)
Platelets: 229 10*3/uL (ref 150–400)
RBC: 4.79 MIL/uL (ref 4.22–5.81)
RDW: 12.4 % (ref 11.5–15.5)
WBC: 8.2 10*3/uL (ref 4.0–10.5)
nRBC: 0 % (ref 0.0–0.2)

## 2018-06-06 LAB — LIPID PANEL
CHOL/HDL RATIO: 4.7 ratio
Cholesterol: 235 mg/dL — ABNORMAL HIGH (ref 0–200)
HDL: 50 mg/dL (ref >40)
LDL Cholesterol: 158 mg/dL — ABNORMAL HIGH (ref 0–99)
Triglycerides: 133 mg/dL (ref <150)
VLDL: 27 mg/dL (ref 0–40)

## 2018-06-06 LAB — POCT ACTIVATED CLOTTING TIME
ACTIVATED CLOTTING TIME: 241 s
ACTIVATED CLOTTING TIME: 362 s
Activated Clotting Time: 681 seconds
Activated Clotting Time: 285 seconds

## 2018-06-06 LAB — HEPARIN LEVEL (UNFRACTIONATED): Heparin Unfractionated: 0.45 IU/mL (ref 0.30–0.70)

## 2018-06-06 LAB — HEMOGLOBIN A1C
Hgb A1c MFr Bld: 5.1 % (ref 4.8–5.6)
Mean Plasma Glucose: 99.67 mg/dL

## 2018-06-06 LAB — TROPONIN I: Troponin I: 5.16 ng/mL (ref <0.03)

## 2018-06-06 LAB — MRSA PCR SCREENING: MRSA by PCR: NEGATIVE

## 2018-06-06 SURGERY — LEFT HEART CATH AND CORONARY ANGIOGRAPHY
Anesthesia: LOCAL

## 2018-06-06 MED ORDER — FENTANYL CITRATE (PF) 100 MCG/2ML IJ SOLN
INTRAMUSCULAR | Status: AC
  Filled 2018-06-06: qty 2

## 2018-06-06 MED ORDER — HYDRALAZINE HCL 20 MG/ML IJ SOLN: 5.0000 mg | INTRAMUSCULAR | Status: AC | PRN

## 2018-06-06 MED ORDER — THE SENSUOUS HEART BOOK
Freq: Once | Status: AC
  Administered 2018-06-06: 21:00:00 1
  Filled 2018-06-06: qty 1

## 2018-06-06 MED ORDER — SODIUM CHLORIDE 0.9 % IV SOLN: INTRAVENOUS | Status: AC

## 2018-06-06 MED ORDER — IOHEXOL 350 MG/ML SOLN
INTRAVENOUS | Status: DC | PRN
  Administered 2018-06-06: 140 mL via INTRACARDIAC

## 2018-06-06 MED ORDER — TIROFIBAN (AGGRASTAT) BOLUS VIA INFUSION
INTRAVENOUS | Status: DC | PRN
  Administered 2018-06-06: 2470 ug via INTRAVENOUS

## 2018-06-06 MED ORDER — HEPARIN (PORCINE) IN NACL 1000-0.9 UT/500ML-% IV SOLN
INTRAVENOUS | Status: AC
  Filled 2018-06-06: qty 1000

## 2018-06-06 MED ORDER — NITROGLYCERIN 1 MG/10 ML FOR IR/CATH LAB
INTRA_ARTERIAL | Status: DC | PRN
  Administered 2018-06-06: 200 ug via INTRACORONARY
  Administered 2018-06-06: 200 mL via INTRACORONARY
  Administered 2018-06-06: 200 ug via INTRACORONARY

## 2018-06-06 MED ORDER — SODIUM CHLORIDE 0.9% FLUSH: 3.0000 mL | INTRAVENOUS | Status: DC | PRN

## 2018-06-06 MED ORDER — TIROFIBAN HCL IN NACL 5-0.9 MG/100ML-% IV SOLN
INTRAVENOUS | Status: AC
  Filled 2018-06-06: qty 100

## 2018-06-06 MED ORDER — SODIUM CHLORIDE 0.9 % IV SOLN: 250.0000 mL | INTRAVENOUS | Status: DC | PRN

## 2018-06-06 MED ORDER — ACETAMINOPHEN 325 MG PO TABS: 650.0000 mg | ORAL_TABLET | ORAL | Status: DC | PRN

## 2018-06-06 MED ORDER — HEPARIN SODIUM (PORCINE) 1000 UNIT/ML IJ SOLN
INTRAMUSCULAR | Status: DC | PRN
  Administered 2018-06-06: 3000 [IU] via INTRAVENOUS
  Administered 2018-06-06: 5000 [IU] via INTRAVENOUS
  Administered 2018-06-06: 2000 [IU] via INTRAVENOUS
  Administered 2018-06-06: 5000 [IU] via INTRAVENOUS

## 2018-06-06 MED ORDER — VERAPAMIL HCL 2.5 MG/ML IV SOLN
INTRAVENOUS | Status: DC | PRN
  Administered 2018-06-06 (×2): 5 mL via INTRA_ARTERIAL

## 2018-06-06 MED ORDER — LABETALOL HCL 5 MG/ML IV SOLN: 10.0000 mg | INTRAVENOUS | Status: AC | PRN

## 2018-06-06 MED ORDER — TIROFIBAN HCL IN NACL 5-0.9 MG/100ML-% IV SOLN
INTRAVENOUS | Status: DC | PRN
  Administered 2018-06-06: 0.15 ug/kg/min via INTRAVENOUS

## 2018-06-06 MED ORDER — HEPARIN (PORCINE) IN NACL 1000-0.9 UT/500ML-% IV SOLN
INTRAVENOUS | Status: AC
  Filled 2018-06-06: qty 500

## 2018-06-06 MED ORDER — LIDOCAINE HCL (PF) 1 % IJ SOLN
INTRAMUSCULAR | Status: AC
  Filled 2018-06-06: qty 30

## 2018-06-06 MED ORDER — HEPARIN SODIUM (PORCINE) 1000 UNIT/ML IJ SOLN
INTRAMUSCULAR | Status: AC
  Filled 2018-06-06: qty 1

## 2018-06-06 MED ORDER — SODIUM CHLORIDE 0.9 % IV SOLN
INTRAVENOUS | Status: AC | PRN
  Administered 2018-06-06: 250 mL via INTRAVENOUS

## 2018-06-06 MED ORDER — SODIUM CHLORIDE 0.9% FLUSH
3.0000 mL | Freq: Two times a day (BID) | INTRAVENOUS | Status: DC
  Administered 2018-06-07 – 2018-06-08 (×3): 3 mL via INTRAVENOUS

## 2018-06-06 MED ORDER — ONDANSETRON HCL 4 MG/2ML IJ SOLN: 4.0000 mg | Freq: Four times a day (QID) | INTRAMUSCULAR | Status: DC | PRN

## 2018-06-06 MED ORDER — ANGIOPLASTY BOOK
Freq: Once | Status: AC
  Administered 2018-06-06: 1
  Filled 2018-06-06: qty 1

## 2018-06-06 MED ORDER — HEART ATTACK BOUNCING BOOK
Freq: Once | Status: AC
  Administered 2018-06-06: 1
  Filled 2018-06-06: qty 1

## 2018-06-06 MED ORDER — VERAPAMIL HCL 2.5 MG/ML IV SOLN
INTRAVENOUS | Status: AC
  Filled 2018-06-06: qty 2

## 2018-06-06 MED ORDER — MIDAZOLAM HCL 2 MG/2ML IJ SOLN
INTRAMUSCULAR | Status: DC | PRN
  Administered 2018-06-06 (×2): 1 mg via INTRAVENOUS

## 2018-06-06 MED ORDER — FENTANYL CITRATE (PF) 100 MCG/2ML IJ SOLN
INTRAMUSCULAR | Status: DC | PRN
  Administered 2018-06-06 (×2): 50 ug via INTRAVENOUS

## 2018-06-06 MED ORDER — MIDAZOLAM HCL 2 MG/2ML IJ SOLN
INTRAMUSCULAR | Status: AC
  Filled 2018-06-06: qty 2

## 2018-06-06 MED ORDER — LIDOCAINE HCL (PF) 1 % IJ SOLN
INTRAMUSCULAR | Status: DC | PRN
  Administered 2018-06-06: 2 mL

## 2018-06-06 MED ORDER — HEPARIN (PORCINE) IN NACL 1000-0.9 UT/500ML-% IV SOLN
INTRAVENOUS | Status: DC | PRN
  Administered 2018-06-06 (×2): 500 mL

## 2018-06-06 SURGICAL SUPPLY — 33 items
BALLN EMERGE MR 2.5X8 (BALLOONS) ×2
BALLN EUPHORA RX 1.5X6 (BALLOONS) ×2
BALLN SAPPHIRE 2.5X15 (BALLOONS) ×2
BALLN WOLVERINE 2.50X6 (BALLOONS) ×2
BALLN ~~LOC~~ EMERGE MR 3.5X6 (BALLOONS) ×2
BALLN ~~LOC~~ EMERGE MR 4.0X6 (BALLOONS) ×2
BALLOON EMERGE MR 2.5X8 (BALLOONS) ×1 IMPLANT
BALLOON EUPHORA RX 1.5X6 (BALLOONS) ×1 IMPLANT
BALLOON SAPPHIRE 2.5X15 (BALLOONS) ×1 IMPLANT
BALLOON WOLVERINE 2.50X6 (BALLOONS) ×1 IMPLANT
BALLOON ~~LOC~~ EMERGE MR 3.5X6 (BALLOONS) ×1 IMPLANT
BALLOON ~~LOC~~ EMERGE MR 4.0X6 (BALLOONS) ×1 IMPLANT
CATH 5FR JL3.5 JR4 ANG PIG MP (CATHETERS) ×2 IMPLANT
CATH LAUNCHER 6FR 3DRC (CATHETERS) ×1 IMPLANT
CATH LAUNCHER 6FR AL.75 (CATHETERS) ×2 IMPLANT
CATH OPTICROSS HD (CATHETERS) ×2 IMPLANT
CATHETER LAUNCHER 6FR 3DRC (CATHETERS) ×2
DEVICE RAD COMP TR BAND LRG (VASCULAR PRODUCTS) ×2 IMPLANT
GLIDESHEATH SLEND A-KIT 6F 22G (SHEATH) ×2 IMPLANT
GUIDELINER 6F (CATHETERS) ×2 IMPLANT
GUIDEWIRE INQWIRE 1.5J.035X260 (WIRE) ×1 IMPLANT
INQWIRE 1.5J .035X260CM (WIRE) ×2
KIT ENCORE 26 ADVANTAGE (KITS) ×2 IMPLANT
KIT HEART LEFT (KITS) ×2 IMPLANT
KIT HEMO VALVE WATCHDOG (MISCELLANEOUS) ×2 IMPLANT
PACK CARDIAC CATHETERIZATION (CUSTOM PROCEDURE TRAY) ×2 IMPLANT
SHEATH PROBE COVER 6X72 (BAG) ×2 IMPLANT
SLED PULL BACK IVUS (MISCELLANEOUS) ×2 IMPLANT
STENT ORSIRO 3.0X26 (Permanent Stent) ×2 IMPLANT
STENT ORSIRO 3.5X9 (Permanent Stent) ×2 IMPLANT
TRANSDUCER W/STOPCOCK (MISCELLANEOUS) ×2 IMPLANT
TUBING CIL FLEX 10 FLL-RA (TUBING) ×2 IMPLANT
WIRE MINAMO 190 (WIRE) ×2 IMPLANT

## 2018-06-06 NOTE — Progress Notes (Signed)
Subjective:  Chest pain-free.  Troponin 5.1.  Mildly elevated AST, ALT, lipase Creatinine increased to 1.65  Objective:  Vital Signs in the last 24 hours: Temp:  [97.6 F (36.4 C)-98.6 F (37 C)] 98.6 F (37 C) (01/03 0532) Pulse Rate:  [59-84] 59 (01/03 0532) Resp:  [9-22] 9 (01/02 1900) BP: (125-156)/(77-116) 132/95 (01/03 0532) SpO2:  [97 %-100 %] 98 % (01/03 0532) Weight:  [98.8 kg-99.8 kg] 98.8 kg (01/03 0532)  Intake/Output from previous day: 01/02 0701 - 01/03 0700 In: 122.8 [I.V.:122.8] Out: 425 [Urine:425] Intake/Output from this shift: Total I/O In: 240 [P.O.:240] Out: 200 [Urine:200]  Physical Exam: Nursing note and vitals reviewed. Constitutional: He is oriented to person, place, and time. He appears well-developed and well-nourished.  HENT:  Head: Normocephalic and atraumatic.  Eyes: Pupils are equal, round, and reactive to light. Conjunctivae are normal.  Neck: No JVD seen.  Cardiovascular: Normal rate, regular rhythm, normal heart sounds and intact distal pulses.  No murmur heard. Respiratory: Effort normal and breath sounds normal. No respiratory distress. He has no wheezes.  GI: Soft. Bowel sounds are normal. He exhibits no distension. There is no abdominal tenderness.  Musculoskeletal:        General: No edema.  Lymphadenopathy:    He has no cervical adenopathy.  Neurological: He is alert and oriented to person, place, and time.  Skin: Skin is warm and dry.  Psychiatric: He has a normal mood and affect.     Lab Results: Recent Labs    06/05/18 1553 06/06/18 0222  WBC 8.3 8.2  HGB 15.7 14.9  PLT 237 229   Recent Labs    06/05/18 1553 06/06/18 0222  NA 137 138  K 4.1 3.9  CL 101 100  CO2 24 27  GLUCOSE 96 109*  BUN 19 16  CREATININE 1.51* 1.65*   Recent Labs    06/06/18 0510  TROPONINI 5.16*   Hepatic Function Panel Recent Labs    06/05/18 1553  PROT 8.3*  ALBUMIN 4.7  AST 62*  ALT 48*  ALKPHOS 44  BILITOT 0.7  BILIDIR  0.2  IBILI 0.5   Recent Labs    06/06/18 0222  CHOL 235*   Cardiac studies: EKG 06/05/2018: Sinus rhythm 68 bpm.  Normal axis.  Normal conduction.  Nonspecific ST-T changes inferior leads.  Low-voltage precordial leads.  Poor R-wave progression.  Hospital echocardiogram 06/06/2018: - Left ventricle: The cavity size was normal. There was mild   concentric hypertrophy. Systolic function was normal. The   estimated ejection fraction was in the range of 55% to 60%. Wall   motion was normal; there were no regional wall motion   abnormalities. Doppler parameters are consistent with abnormal   left ventricular relaxation (grade 1 diastolic dysfunction). - No significant valvular abnormality.  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  Non-STEMI: No acute ischemic changes.  Hemodynamically and electrically stable.  Trop 5.1 ng/mL.Admitted to telemetry.   Continue aspirin, Proventil, IV heparin, Lipitor 80 mg, Toprol 25 mg twice daily, amlodipine 5 mg daily.  Plan for coronary angiography later this afternoon subject to improvement in creatinine.  Hyperlipidemia: Started on lipitor 80 mg daily. Will need monitoring of AST, ALT  ?AKI/CKD: Baseline creatinine not known.  Recommend aggressive IV hydration.  Elevated AST, ALT, lipase: No abdominal symptoms at this time.  I discussed this with [facility] gastroenterologist Dr. Therisa Doyne. Recommend outpatient follow up.  LOS: 1 day    [place] 06/06/2018, 9:55 AM  [place], MD [facility] Cardiovascular. [place] Pager: [phone] Office: [phone] If no answer Cell [phone]

## 2018-06-06 NOTE — Interval H&P Note (Signed)
History and Physical Interval Note:  06/06/2018 3:55 PM  William Hancock  has presented today for surgery, with the diagnosis of Nstemi  The various methods of treatment have been discussed with the patient and family. After consideration of risks, benefits and other options for treatment, the patient has consented to  Procedure(s): LEFT HEART CATH AND CORONARY ANGIOGRAPHY (N/A) as a surgical intervention .  The patient's history has been reviewed, patient examined, no change in status, stable for surgery.  I have reviewed the patient's chart and labs.  Questions were answered to the patient's satisfaction.    2016 Appropriate Use Criteria for Coronary Revascularization in Patients With Acute Coronary Syndrome NSTEMI/UA High Risk (TIMI Score 5-7) NSTEMI/Unstable angina, stabilized patient at high risk Link Here: sistemancia.com Indication:  Revascularization by PCI or CABG of 1 or more arteries in a patient with NSTEMI or unstable angina with Stabilization after presentation High risk for clinical events  A (7) Indication: 16; Score 7     [place]

## 2018-06-06 NOTE — Progress Notes (Signed)
CRITICAL VALUE ALERT  Critical Value:  Troponin 5.16  Date & Time Notied:  06/06/2018  6:32 am  Provider Notified: Vernell Leep, MD  Orders Received/Actions taken: No new orders received. May have breakfast this am. NPO at 10 am and [facility] Cath at 4 pm.

## 2018-06-06 NOTE — Progress Notes (Signed)
[place] for Heparin Indication: chest pain/ACS  Allergies  Allergen Reactions  . Penicillins     DID THE REACTION INVOLVE: Swelling of the face/tongue/throat, SOB, or low BP? N Sudden or severe rash/hives, skin peeling, or the inside of the mouth or nose? N Did it require medical treatment? N When did it last happen? If all above answers are "NO", may proceed with cephalosporin use.     Patient Measurements: Height: 5\' 11"  (180.3 cm) Weight: 218 lb 6.4 oz (99.1 kg) IBW/kg (Calculated) : 75.3 Heparin Dosing Weight: 95.8 kg  Vital Signs: Temp: 97.6 F (36.4 C) (01/02 2018) Temp Source: Oral (01/02 2018) BP: 152/110 (01/02 2018) Pulse Rate: 65 (01/02 2018)  Labs: Recent Labs    06/05/18 1553 06/05/18 1700 06/05/18 2303  HGB 15.7  --   --   HCT 45.7  --   --   PLT 237  --   --   APTT  --  35  --   LABPROT  --  11.4  --   INR  --  0.84  --   HEPARINUNFRC  --   --  0.32  CREATININE 1.51*  --   --     Estimated Creatinine Clearance: 67.1 mL/min (A) (by C-G formula based on SCr of 1.51 mg/dL (H)).   Medical History: Past Medical History:  Diagnosis Date  . Hyperlipidemia   . Hypertension     Medications:  Not on any anticoagulants PTA. Confirmed with patient.  Assessment: Pt is a 53 year old male presenting with chest pain, nausea. Pharmacy has been consulted to dose/monitor heparin drip for ACS.  Baseline Hgb: 15.7 Baseline Plt: 237  Today, 06/06/18  Initial heparin level is therapeutic   Goal of Therapy:  Heparin level 0.3-0.7 units/ml Monitor platelets by anticoagulation protocol: Yes   Plan:   Cont heparin at 1200 units/hr  Confirmatory heparin level with AM labs  Cath 1/3   Narda Bonds, PharmD, [place] Pharmacist Phone: [phone]

## 2018-06-06 NOTE — Progress Notes (Signed)
Hematoma forming proximal to TR band. Band repositioned x 2, with no further expansion of hematoma.  Spo2 98% as measured in right thumb, on arrival to 6c.

## 2018-06-06 NOTE — Progress Notes (Signed)
[place] for Heparin Indication: chest pain/ACS  Allergies  Allergen Reactions  . Penicillins     DID THE REACTION INVOLVE: Swelling of the face/tongue/throat, SOB, or low BP? N Sudden or severe rash/hives, skin peeling, or the inside of the mouth or nose? N Did it require medical treatment? N When did it last happen? If all above answers are "NO", may proceed with cephalosporin use.     Patient Measurements: Height: 5\' 11"  (180.3 cm) Weight: 217 lb 14.4 oz (98.8 kg) IBW/kg (Calculated) : 75.3 Heparin Dosing Weight: 95.8 kg  Vital Signs: Temp: 98.6 F (37 C) (01/03 0532) Temp Source: Oral (01/03 0532) BP: 132/95 (01/03 0532) Pulse Rate: 59 (01/03 0532)  Labs: Recent Labs    06/05/18 1553 06/05/18 1700 06/05/18 2303 06/06/18 0222 06/06/18 0510  HGB 15.7  --   --  14.9  --   HCT 45.7  --   --  43.5  --   PLT 237  --   --  229  --   APTT  --  35  --   --   --   LABPROT  --  11.4  --   --   --   INR  --  0.84  --   --   --   HEPARINUNFRC  --   --  0.32  --  0.45  CREATININE 1.51*  --   --  1.65*  --   TROPONINI  --   --   --   --  5.16*    Estimated Creatinine Clearance: 61.3 mL/min (A) (by C-G formula based on SCr of 1.65 mg/dL (H)).   Assessment: 53 year old male on heparin drip for ACS. Heparin level 0.45 (therapeutic) on gtt at 1200 units/hr. CBC stable. Plan for cath this afternoon.  Goal of Therapy:  Heparin level 0.3-0.7 units/ml Monitor platelets by anticoagulation protocol: Yes   Plan:   Cont heparin at 1200 units/hr  Daily CBC/HL  F/u post cath  Sherlon Handing, PharmD, BCPS Clinical pharmacist  **Pharmacist phone directory can now be found on [email or web address] ([email or web address]).  Listed under [place]. 06/06/2018 11:07 AM

## 2018-06-06 NOTE — Progress Notes (Signed)
Complex PCI. Prox 3.5 X 9 cm Orsero, DES, post dilatation 4 X 6 mm [place] Mid 3.0 X 26 mm Orsero DES, post dilatation 4 X 6 mm [place].  Full note to follow.  Nigel Mormon, MD [facility]. [place] Pager: [phone] Office: [phone] If no answer Cell [phone]

## 2018-06-06 NOTE — Progress Notes (Signed)
[place] for Heparin Indication: chest pain/ACS  Allergies  Allergen Reactions  . Penicillins     DID THE REACTION INVOLVE: Swelling of the face/tongue/throat, SOB, or low BP? N Sudden or severe rash/hives, skin peeling, or the inside of the mouth or nose? N Did it require medical treatment? N When did it last happen? If all above answers are "NO", may proceed with cephalosporin use.     Patient Measurements: Height: 5\' 11"  (180.3 cm) Weight: 218 lb 6.4 oz (99.1 kg) IBW/kg (Calculated) : 75.3 Heparin Dosing Weight: 95.8 kg  Vital Signs: Temp: 98.6 F (37 C) (01/03 0532) Temp Source: Oral (01/03 0532) BP: 132/95 (01/03 0532) Pulse Rate: 59 (01/03 0532)  Labs: Recent Labs    06/05/18 1553 06/05/18 1700 06/05/18 2303 06/06/18 0222 06/06/18 0510  HGB 15.7  --   --  14.9  --   HCT 45.7  --   --  43.5  --   PLT 237  --   --  229  --   APTT  --  35  --   --   --   LABPROT  --  11.4  --   --   --   INR  --  0.84  --   --   --   HEPARINUNFRC  --   --  0.32  --  0.45  CREATININE 1.51*  --   --  1.65*  --     Estimated Creatinine Clearance: 61.4 mL/min (A) (by C-G formula based on SCr of 1.65 mg/dL (H)).   Medical History: Past Medical History:  Diagnosis Date  . Hyperlipidemia   . Hypertension     Medications:  Not on any anticoagulants PTA. Confirmed with patient.  Assessment: Pt is a 53 year old male presenting with chest pain, nausea. Pharmacy has been consulted to dose/monitor heparin drip for ACS.  Baseline Hgb: 15.7 Baseline Plt: 237  Today, 06/06/18 6:03 AM  Heparin level therapeutic x 2  Goal of Therapy:  Heparin level 0.3-0.7 units/ml Monitor platelets by anticoagulation protocol: Yes   Plan:   Cont heparin at 1200 units/hr  Daily CBC/HL  Cath 1/3  Narda Bonds, PharmD, BCPS Clinical Pharmacist Phone: [phone]

## 2018-06-07 LAB — CBC
HCT: 40.8 % (ref 39.0–52.0)
Hemoglobin: 13.9 g/dL (ref 13.0–17.0)
MCH: 31.3 pg (ref 26.0–34.0)
MCHC: 34.1 g/dL (ref 30.0–36.0)
MCV: 91.9 fL (ref 80.0–100.0)
Platelets: 196 10*3/uL (ref 150–400)
RBC: 4.44 MIL/uL (ref 4.22–5.81)
RDW: 12.8 % (ref 11.5–15.5)
WBC: 6.2 10*3/uL (ref 4.0–10.5)
nRBC: 0 % (ref 0.0–0.2)

## 2018-06-07 LAB — BASIC METABOLIC PANEL
Anion gap: 8 (ref 5–15)
BUN: 13 mg/dL (ref 6–20)
CO2: 21 mmol/L — ABNORMAL LOW (ref 22–32)
Calcium: 8.5 mg/dL — ABNORMAL LOW (ref 8.9–10.3)
Chloride: 108 mmol/L (ref 98–111)
Creatinine, Ser: 1.49 mg/dL — ABNORMAL HIGH (ref 0.61–1.24)
GFR calc Af Amer: >60 mL/min (ref >60)
GFR calc non Af Amer: 52 mL/min — ABNORMAL LOW (ref >60)
Glucose, Bld: 84 mg/dL (ref 70–99)
Potassium: 3.6 mmol/L (ref 3.5–5.1)
Sodium: 137 mmol/L (ref 135–145)

## 2018-06-07 LAB — HEPARIN LEVEL (UNFRACTIONATED)

## 2018-06-07 NOTE — Progress Notes (Signed)
TR BAND REMOVAL  LOCATION:    right radial  DEFLATED PER PROTOCOL:    Yes.    TIME BAND OFF / DRESSING APPLIED:    23:45   SITE UPON ARRIVAL:    Level 1 (small hematoma)  SITE AFTER BAND REMOVAL:    Level 1 (Bruise)  CIRCULATION SENSATION AND MOVEMENT:    Within Normal Limits   Yes.    COMMENTS:   Small hematoma resolved. Post TR band instructions given. Pt tolerated well.

## 2018-06-07 NOTE — Progress Notes (Signed)
Subjective:  Chest pain-free.  S/p cath on 06/06/2017.  Objective:  Vital Signs in the last 24 hours: Temp:  [97.6 F (36.4 C)-98.3 F (36.8 C)] 97.6 F (36.4 C) (01/04 1012) Pulse Rate:  [54-77] 70 (01/04 1012) Resp:  [4-19] 12 (01/04 0448) BP: (111-149)/(84-106) 139/96 (01/04 1012) SpO2:  [0 %-100 %] 97 % (01/04 1012) Weight:  [98.8 kg] 98.8 kg (01/04 0448)  Intake/Output from previous day: 01/03 0701 - 01/04 0700 In: 4098 [P.O.:720; I.V.:750] Out: 1800 [Urine:1800] Intake/Output from this shift: Total I/O In: 240 [P.O.:240] Out: -   Physical Exam: Nursing note and vitals reviewed. Constitutional: He is oriented to person, place, and time. He appears well-developed and well-nourished.  HENT:  Head: Normocephalic and atraumatic.  Eyes: Pupils are equal, round, and reactive to light. Conjunctivae are normal.  Neck: No JVD seen.  Cardiovascular: Normal rate, regular rhythm, normal heart sounds and intact distal pulses.  No murmur heard. Respiratory: Effort normal and breath sounds normal. No respiratory distress. He has no wheezes.  GI: Soft. Bowel sounds are normal. He exhibits no distension. There is no abdominal tenderness.  Musculoskeletal:        General: No edema.  Lymphadenopathy:    He has no cervical adenopathy.  Neurological: He is alert and oriented to person, place, and time.  Skin: Skin is warm and dry.  Psychiatric: He has a normal mood and affect.     Lab Results: Recent Labs    06/06/18 0222 06/07/18 0445  WBC 8.2 6.2  HGB 14.9 13.9  PLT 229 196   Recent Labs    06/06/18 1156 06/07/18 0445  NA 139 137  K 3.7 3.6  CL 108 108  CO2 24 21*  GLUCOSE 108* 84  BUN 15 13  CREATININE 1.56* 1.49*   Recent Labs    06/06/18 0510  TROPONINI 5.16*   Hepatic Function Panel Recent Labs    06/05/18 1553  PROT 8.3*  ALBUMIN 4.7  AST 62*  ALT 48*  ALKPHOS 44  BILITOT 0.7  BILIDIR 0.2  IBILI 0.5   Recent Labs    06/06/18 0222  CHOL  235*   Cardiac studies: Cath 06/07/2018: LM: Normal LAD: Prox-mid 80% calcific stenosis-->Will have staged intervention Ramus: Prox 40% disease LCx; Mild luminal irregularities RCA: Large, severely calcified vessel, distally undefiled.        Severe mid 99% stenosis--->Orsero 3.5 X 26 mm Orsero DES        Post dilatation with 4.0 mm [place] balloon        Severe prox 80% stenosis--->Orsero 3.5 X 9 mm DES        Post dilatation with 4.0 mm  balloon        Diffuse distal RCA, PDA moderate-severe disease, best treated medically Normal LVEDP  Recommendation: DAPT for 1 year Aggressive risk factor modification Staged LAD PCI with IVUS use and possible atherectomy.  EKG 06/06/2018: Sinus rhythm. Normal axis.  Normal conduction. Inferolateral T wave inversions.  Hospital echocardiogram 06/06/2018: - Left ventricle: The cavity size was normal. There was mild   concentric hypertrophy. Systolic function was normal. The   estimated ejection fraction was in the range of 55% to 60%. Wall   motion was normal; there were no regional wall motion   abnormalities. Doppler parameters are consistent with abnormal   left ventricular relaxation (grade 1 diastolic dysfunction). - No significant valvular abnormality.  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  Non-STEMI: Severe multivessel CAD S/p Prox and mid RCA PCI 06/06/2018 Plan for staged PCI to mid LAD on 06/09/2018. Continue aspirin, Brilinta, ;ipitor 80 mg, metoprolol 25 mg twice daily, amlodipine 5 mg daily.  Cardiac rehab after discharge.  Hyperlipidemia: Started on lipitor 80 mg daily. Will need monitoring of AST, ALT.  ?AKI/CKD: Improving. Continue liberal oral hydration. Will need IV fluids on 01/05 to prepare for cath on 01/06.  Elevated AST, ALT, lipase: No abdominal symptoms at this  time.  I discussed this with [facility] gastroenterologist Dr. Therisa Doyne. Recommend outpatient follow up.     LOS: 2 days     J  06/07/2018, 1:37 PM   Esther Hardy, MD [facility]. [place] Pager: [phone] Office: [phone] If no answer Cell [phone]

## 2018-06-07 NOTE — Progress Notes (Signed)
CARDIAC REHAB PHASE I   PRE:  Rate/Rhythm: 66 SR  BP:  Supine:   Sitting: 131/88  Standing:    SaO2:   MODE:  Ambulation: 500 ft   POST:  Rate/Rhythm: 73 SR  BP:  Supine:   Sitting: 144/104, 129/97  Standing:    SaO2:  2080-2233 Pt walked 500 ft on RA with steady gait and tolerated well. No CP. MI education completed with pt who voiced understanding. Stressed importance of brililnta with stent. Will need brilinta card and to be seen by case manager prior to discharge. Reviewed MI restrictions, NTG use, heart healthy food choices, ex ed and CRP 2, risk factors. Referred to [place] CRP 2.   Graylon Good, RN BSN  06/07/2018 8:36 AM

## 2018-06-08 LAB — CBC
HCT: 42 % (ref 39.0–52.0)
Hemoglobin: 14.4 g/dL (ref 13.0–17.0)
MCH: 31.3 pg (ref 26.0–34.0)
MCHC: 34.3 g/dL (ref 30.0–36.0)
MCV: 91.3 fL (ref 80.0–100.0)
Platelets: 201 10*3/uL (ref 150–400)
RBC: 4.6 MIL/uL (ref 4.22–5.81)
RDW: 12.8 % (ref 11.5–15.5)
WBC: 7.3 10*3/uL (ref 4.0–10.5)
nRBC: 0 % (ref 0.0–0.2)

## 2018-06-08 LAB — BASIC METABOLIC PANEL
Anion gap: 6 (ref 5–15)
BUN: 15 mg/dL (ref 6–20)
CO2: 25 mmol/L (ref 22–32)
Calcium: 9.4 mg/dL (ref 8.9–10.3)
Chloride: 106 mmol/L (ref 98–111)
Creatinine, Ser: 1.49 mg/dL — ABNORMAL HIGH (ref 0.61–1.24)
GFR calc Af Amer: >60 mL/min (ref >60)
GFR calc non Af Amer: 52 mL/min — ABNORMAL LOW (ref >60)
Glucose, Bld: 87 mg/dL (ref 70–99)
Potassium: 4.1 mmol/L (ref 3.5–5.1)
Sodium: 137 mmol/L (ref 135–145)

## 2018-06-08 LAB — HEPARIN LEVEL (UNFRACTIONATED): Heparin Unfractionated: <0.1 IU/mL — ABNORMAL LOW (ref 0.30–0.70)

## 2018-06-08 MED ORDER — SODIUM CHLORIDE 0.9 % IV SOLN
INTRAVENOUS | Status: DC
  Administered 2018-06-08: 14:00:00 via INTRAVENOUS

## 2018-06-08 MED ORDER — SODIUM CHLORIDE 0.9% FLUSH: 3.0000 mL | INTRAVENOUS | Status: DC | PRN

## 2018-06-08 MED ORDER — SODIUM CHLORIDE 0.9% FLUSH
3.0000 mL | Freq: Two times a day (BID) | INTRAVENOUS | Status: DC
  Administered 2018-06-08 (×2): 3 mL via INTRAVENOUS

## 2018-06-08 MED ORDER — SODIUM CHLORIDE 0.9 % IV SOLN: 250.0000 mL | INTRAVENOUS | Status: DC | PRN

## 2018-06-08 NOTE — Progress Notes (Signed)
Subjective:  Chest pain-free.  S/p cath on 06/06/2017.  Objective:  Vital Signs in the last 24 hours: Temp:  [97.9 F (36.6 C)-98.7 F (37.1 C)] 97.9 F (36.6 C) (01/05 0600) Pulse Rate:  [61-75] 61 (01/05 0600) BP: (121)/(84-90) 121/84 (01/05 0600) SpO2:  [96 %-97 %] 97 % (01/05 0600) Weight:  [98.2 kg] 98.2 kg (01/05 0600)  Intake/Output from previous day: 01/04 0701 - 01/05 0700 In: 521.3 [P.O.:300; I.V.:221.3] Out: -  Intake/Output from this shift: Total I/O In: 60 [P.O.:60] Out: 800 [Urine:800]  Physical Exam: Nursing note and vitals reviewed. Constitutional: He is oriented to person, place, and time. He appears well-developed and well-nourished.  HENT:  Head: Normocephalic and atraumatic.  Eyes: Pupils are equal, round, and reactive to light. Conjunctivae are normal.  Neck: No JVD seen.  Cardiovascular: Normal rate, regular rhythm, normal heart sounds and intact distal pulses.  No murmur heard. Respiratory: Effort normal and breath sounds normal. No respiratory distress. He has no wheezes.  GI: Soft. Bowel sounds are normal. He exhibits no distension. There is no abdominal tenderness.  Musculoskeletal:        General: No edema.  Lymphadenopathy:    He has no cervical adenopathy.  Neurological: He is alert and oriented to person, place, and time.  Skin: Skin is warm and dry.  Psychiatric: He has a normal mood and affect.     Lab Results: Recent Labs    06/07/18 0445 06/08/18 0436  WBC 6.2 7.3  HGB 13.9 14.4  PLT 196 201   Recent Labs    06/07/18 0445 06/08/18 0942  NA 137 137  K 3.6 4.1  CL 108 106  CO2 21* 25  GLUCOSE 84 87  BUN 13 15  CREATININE 1.49* 1.49*   Recent Labs    06/06/18 0510  TROPONINI 5.16*   Hepatic Function Panel Recent Labs    06/05/18 1553  PROT 8.3*  ALBUMIN 4.7  AST 62*  ALT 48*  ALKPHOS 44  BILITOT 0.7  BILIDIR 0.2  IBILI 0.5   Recent Labs    06/06/18 0222  CHOL 235*   Cardiac studies: Cath  06/07/2018: LM: Normal LAD: Prox-mid 80% calcific stenosis-->Will have staged intervention Ramus: Prox 40% disease LCx; Mild luminal irregularities RCA: Large, severely calcified vessel, distally undefiled.        Severe mid 99% stenosis--->Orsero 3.5 X 26 mm Orsero DES        Post dilatation with 4.0 mm [place] balloon        Severe prox 80% stenosis--->Orsero 3.5 X 9 mm DES        Post dilatation with 4.0 mm [place] balloon        Diffuse distal RCA, PDA moderate-severe disease, best treated medically Normal LVEDP  Recommendation: DAPT for 1 year Aggressive risk factor modification Staged LAD PCI with IVUS use and possible atherectomy.  EKG 06/06/2018: Sinus rhythm. Normal axis.  Normal conduction. Inferolateral T wave inversions.  Hospital echocardiogram 06/06/2018: - Left ventricle: The cavity size was normal. There was mild   concentric hypertrophy. Systolic function was normal. The   estimated ejection fraction was in the range of 55% to 60%. Wall   motion was normal; there were no regional wall motion   abnormalities. Doppler parameters are consistent with abnormal   left ventricular relaxation (grade 1 diastolic dysfunction). - No significant valvular abnormality.  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  Non-STEMI: Severe multivessel CAD S/p Prox and mid RCA PCI 06/06/2018 Plan for staged PCI to mid LAD on 06/09/2018. Continue aspirin, Brilinta, Lipitor 80 mg, metoprolol 25 mg twice daily, amlodipine 5 mg daily.  Cardiac rehab after discharge.  Hyperlipidemia: Started on lipitor 80 mg daily. Will need monitoring of AST, ALT.  ?AKI/CKD: Resolved. Continue liberal oral hydration.  IV fluids starting today to prepare for cath on 01/06.  Elevated AST, ALT, lipase: No abdominal symptoms at this time.  I discussed this with [facility]  gastroenterologist Dr. Therisa Doyne. Recommend outpatient follow up.     LOS: 3 days     J  06/08/2018, 1:16 PM  [place], MD [facility]. [place] Pager: [phone] Office: [phone] If no answer Cell [phone]

## 2018-06-08 NOTE — H&P (View-Only) (Signed)
Subjective:  Chest pain-free.  S/p cath on 06/06/2017.  Objective:  Vital Signs in the last 24 hours: Temp:  [97.9 F (36.6 C)-98.7 F (37.1 C)] 97.9 F (36.6 C) (01/05 0600) Pulse Rate:  [61-75] 61 (01/05 0600) BP: (121)/(84-90) 121/84 (01/05 0600) SpO2:  [96 %-97 %] 97 % (01/05 0600) Weight:  [98.2 kg] 98.2 kg (01/05 0600)  Intake/Output from previous day: 01/04 0701 - 01/05 0700 In: 521.3 [P.O.:300; I.V.:221.3] Out: -  Intake/Output from this shift: Total I/O In: 60 [P.O.:60] Out: 800 [Urine:800]  Physical Exam: Nursing note and vitals reviewed. Constitutional: He is oriented to person, place, and time. He appears well-developed and well-nourished.  HENT:  Head: Normocephalic and atraumatic.  Eyes: Pupils are equal, round, and reactive to light. Conjunctivae are normal.  Neck: No JVD seen.  Cardiovascular: Normal rate, regular rhythm, normal heart sounds and intact distal pulses.  No murmur heard. Respiratory: Effort normal and breath sounds normal. No respiratory distress. He has no wheezes.  GI: Soft. Bowel sounds are normal. He exhibits no distension. There is no abdominal tenderness.  Musculoskeletal:        General: No edema.  Lymphadenopathy:    He has no cervical adenopathy.  Neurological: He is alert and oriented to person, place, and time.  Skin: Skin is warm and dry.  Psychiatric: He has a normal mood and affect.     Lab Results: Recent Labs    06/07/18 0445 06/08/18 0436  WBC 6.2 7.3  HGB 13.9 14.4  PLT 196 201   Recent Labs    06/07/18 0445 06/08/18 0942  NA 137 137  K 3.6 4.1  CL 108 106  CO2 21* 25  GLUCOSE 84 87  BUN 13 15  CREATININE 1.49* 1.49*   Recent Labs    06/06/18 0510  TROPONINI 5.16*   Hepatic Function Panel Recent Labs    06/05/18 1553  PROT 8.3*  ALBUMIN 4.7  AST 62*  ALT 48*  ALKPHOS 44  BILITOT 0.7  BILIDIR 0.2  IBILI 0.5   Recent Labs    06/06/18 0222  CHOL 235*   Cardiac studies: Cath  06/07/2018: LM: Normal LAD: Prox-mid 80% calcific stenosis-->Will have staged intervention Ramus: Prox 40% disease LCx; Mild luminal irregularities RCA: Large, severely calcified vessel, distally undefiled.        Severe mid 99% stenosis--->Orsero 3.5 X 26 mm Orsero DES        Post dilatation with 4.0 mm [place] balloon        Severe prox 80% stenosis--->Orsero 3.5 X 9 mm DES        Post dilatation with 4.0 mm [place] balloon        Diffuse distal RCA, PDA moderate-severe disease, best treated medically Normal LVEDP  Recommendation: DAPT for 1 year Aggressive risk factor modification Staged LAD PCI with IVUS use and possible atherectomy.  EKG 06/06/2018: Sinus rhythm. Normal axis.  Normal conduction. Inferolateral T wave inversions.  Hospital echocardiogram 06/06/2018: - Left ventricle: The cavity size was normal. There was mild   concentric hypertrophy. Systolic function was normal. The   estimated ejection fraction was in the range of 55% to 60%. Wall   motion was normal; there were no regional wall motion   abnormalities. Doppler parameters are consistent with abnormal   left ventricular relaxation (grade 1 diastolic dysfunction). - No significant valvular abnormality.  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  Non-STEMI: Severe multivessel CAD S/p Prox and mid RCA PCI 06/06/2018 Plan for staged PCI to mid LAD on 06/09/2018. Continue aspirin, Brilinta, Lipitor 80 mg, metoprolol 25 mg twice daily, amlodipine 5 mg daily.  Cardiac rehab after discharge.  Hyperlipidemia: Started on lipitor 80 mg daily. Will need monitoring of AST, ALT.  ?AKI/CKD: Resolved. Continue liberal oral hydration.  IV fluids starting today to prepare for cath on 01/06.  Elevated AST, ALT, lipase: No abdominal symptoms at this time.  I discussed this with [facility]  gastroenterologist Dr. Therisa Doyne. Recommend outpatient follow up.     LOS: 3 days    Dwayne Begay J [place] 06/08/2018, 1:16 PM  [place], MD [facility] [place] Pager: [phone] Office: [phone] If no answer Cell [phone]

## 2018-06-08 NOTE — Plan of Care (Signed)
  Problem: Education: Goal: Knowledge of General Education information will improve Description Including pain rating scale, medication(s)/side effects and non-pharmacologic comfort measures Outcome: Completed/Met   Problem: Activity: Goal: Ability to tolerate increased activity will improve Outcome: Completed/Met

## 2018-06-09 ENCOUNTER — Other Ambulatory Visit: Payer: Self-pay

## 2018-06-09 ENCOUNTER — Encounter (HOSPITAL_COMMUNITY): Payer: Self-pay | Admitting: Cardiology

## 2018-06-09 ENCOUNTER — Inpatient Hospital Stay (HOSPITAL_COMMUNITY)
Admission: EM | Disposition: A | Discharge status: Home / Self Care | Payer: Self-pay | Source: Home / Self Care | Attending: Cardiology

## 2018-06-09 DIAGNOSIS — N179 Acute kidney failure, unspecified: Secondary | ICD-10-CM | POA: Diagnosis not present

## 2018-06-09 DIAGNOSIS — I10 Essential (primary) hypertension: Secondary | ICD-10-CM | POA: Diagnosis present

## 2018-06-09 DIAGNOSIS — I129 Hypertensive chronic kidney disease with stage 1 through stage 4 chronic kidney disease, or unspecified chronic kidney disease: Secondary | ICD-10-CM | POA: Diagnosis not present

## 2018-06-09 DIAGNOSIS — R748 Abnormal levels of other serum enzymes: Secondary | ICD-10-CM

## 2018-06-09 DIAGNOSIS — I214 Non-ST elevation (NSTEMI) myocardial infarction: Secondary | ICD-10-CM | POA: Diagnosis not present

## 2018-06-09 DIAGNOSIS — E785 Hyperlipidemia, unspecified: Secondary | ICD-10-CM | POA: Diagnosis present

## 2018-06-09 DIAGNOSIS — I25118 Atherosclerotic heart disease of native coronary artery with other forms of angina pectoris: Secondary | ICD-10-CM | POA: Diagnosis not present

## 2018-06-09 HISTORY — PX: CORONARY STENT INTERVENTION: CATH118234

## 2018-06-09 HISTORY — PX: CORONARY ULTRASOUND/IVUS: CATH118244

## 2018-06-09 HISTORY — DX: Abnormal levels of other serum enzymes: R74.8

## 2018-06-09 HISTORY — PX: CORONARY ATHERECTOMY: CATH118238

## 2018-06-09 HISTORY — PX: CARDIAC CATHETERIZATION: SHX172

## 2018-06-09 LAB — COMPREHENSIVE METABOLIC PANEL
ALT: 40 U/L (ref 0–44)
AST: 44 U/L — ABNORMAL HIGH (ref 15–41)
Albumin: 3.7 g/dL (ref 3.5–5.0)
Alkaline Phosphatase: 45 U/L (ref 38–126)
Anion gap: 6 (ref 5–15)
BILIRUBIN TOTAL: 1.4 mg/dL — AB (ref 0.3–1.2)
BUN: 13 mg/dL (ref 6–20)
CO2: 20 mmol/L — ABNORMAL LOW (ref 22–32)
Calcium: 9 mg/dL (ref 8.9–10.3)
Chloride: 110 mmol/L (ref 98–111)
Creatinine, Ser: 1.41 mg/dL — ABNORMAL HIGH (ref 0.61–1.24)
GFR calc Af Amer: >60 mL/min (ref >60)
GFR calc non Af Amer: 56 mL/min — ABNORMAL LOW (ref >60)
Glucose, Bld: 87 mg/dL (ref 70–99)
Potassium: 3.9 mmol/L (ref 3.5–5.1)
Sodium: 136 mmol/L (ref 135–145)
Total Protein: 7.1 g/dL (ref 6.5–8.1)

## 2018-06-09 LAB — POCT I-STAT, CHEM 8
BUN: 15 mg/dL (ref 6–20)
Calcium, Ion: 1.24 mmol/L (ref 1.15–1.40)
Chloride: 107 mmol/L (ref 98–111)
Creatinine, Ser: 1.4 mg/dL — ABNORMAL HIGH (ref 0.61–1.24)
Glucose, Bld: 80 mg/dL (ref 70–99)
HCT: 42 % (ref 39.0–52.0)
HEMOGLOBIN: 14.3 g/dL (ref 13.0–17.0)
Potassium: 4.3 mmol/L (ref 3.5–5.1)
Sodium: 140 mmol/L (ref 135–145)
TCO2: 24 mmol/L (ref 22–32)

## 2018-06-09 LAB — CBC
HCT: 40.7 % (ref 39.0–52.0)
Hemoglobin: 14 g/dL (ref 13.0–17.0)
MCH: 31.1 pg (ref 26.0–34.0)
MCHC: 34.4 g/dL (ref 30.0–36.0)
MCV: 90.4 fL (ref 80.0–100.0)
NRBC: 0 % (ref 0.0–0.2)
PLATELETS: 187 10*3/uL (ref 150–400)
RBC: 4.5 MIL/uL (ref 4.22–5.81)
RDW: 12.5 % (ref 11.5–15.5)
WBC: 6.6 10*3/uL (ref 4.0–10.5)

## 2018-06-09 LAB — POCT ACTIVATED CLOTTING TIME: Activated Clotting Time: 367 seconds

## 2018-06-09 SURGERY — CORONARY STENT INTERVENTION
Anesthesia: LOCAL

## 2018-06-09 MED ORDER — FENTANYL CITRATE (PF) 100 MCG/2ML IJ SOLN
INTRAMUSCULAR | Status: DC | PRN
  Administered 2018-06-09: 50 ug via INTRAVENOUS

## 2018-06-09 MED ORDER — FENTANYL CITRATE (PF) 100 MCG/2ML IJ SOLN
INTRAMUSCULAR | Status: AC
  Filled 2018-06-09: qty 2

## 2018-06-09 MED ORDER — BIVALIRUDIN TRIFLUOROACETATE 250 MG IV SOLR
INTRAVENOUS | Status: AC
  Filled 2018-06-09: qty 250

## 2018-06-09 MED ORDER — MIDAZOLAM HCL 2 MG/2ML IJ SOLN
INTRAMUSCULAR | Status: DC | PRN
  Administered 2018-06-09: 2 mg via INTRAVENOUS

## 2018-06-09 MED ORDER — ALPRAZOLAM 0.25 MG PO TABS
0.2500 mg | ORAL_TABLET | Freq: Four times a day (QID) | ORAL | Status: DC | PRN
  Administered 2018-06-09: 15:00:00 0.25 mg via ORAL
  Filled 2018-06-09: qty 1

## 2018-06-09 MED ORDER — SODIUM CHLORIDE 0.9 % IV SOLN
INTRAVENOUS | Status: AC | PRN
  Administered 2018-06-09: 1.75 mg/kg/h via INTRAVENOUS
  Administered 2018-06-09: 1.75 mg/kg/h
  Administered 2018-06-09: 1.75 mg/kg/h via INTRAVENOUS

## 2018-06-09 MED ORDER — VERAPAMIL HCL 2.5 MG/ML IV SOLN
INTRAVENOUS | Status: DC | PRN
  Administered 2018-06-09: 10 mL via INTRA_ARTERIAL

## 2018-06-09 MED ORDER — MIDAZOLAM HCL 2 MG/2ML IJ SOLN
INTRAMUSCULAR | Status: AC
  Filled 2018-06-09: qty 2

## 2018-06-09 MED ORDER — IOHEXOL 350 MG/ML SOLN
INTRAVENOUS | Status: DC | PRN
  Administered 2018-06-09: 305 mL via INTRA_ARTERIAL

## 2018-06-09 MED ORDER — SODIUM CHLORIDE 0.9 % IV SOLN
INTRAVENOUS | Status: AC
  Administered 2018-06-09 (×2): via INTRAVENOUS

## 2018-06-09 MED ORDER — HEPARIN (PORCINE) IN NACL 1000-0.9 UT/500ML-% IV SOLN
INTRAVENOUS | Status: AC
  Filled 2018-06-09: qty 1000

## 2018-06-09 MED ORDER — LIDOCAINE HCL (PF) 1 % IJ SOLN
INTRAMUSCULAR | Status: DC | PRN
  Administered 2018-06-09: 2 mL

## 2018-06-09 MED ORDER — NITROGLYCERIN 1 MG/10 ML FOR IR/CATH LAB
INTRA_ARTERIAL | Status: AC
  Filled 2018-06-09: qty 10

## 2018-06-09 MED ORDER — HEPARIN (PORCINE) IN NACL 1000-0.9 UT/500ML-% IV SOLN
INTRAVENOUS | Status: DC | PRN
  Administered 2018-06-09 (×3): 500 mL

## 2018-06-09 MED ORDER — VERAPAMIL HCL 2.5 MG/ML IV SOLN
INTRAVENOUS | Status: AC
  Filled 2018-06-09: qty 2

## 2018-06-09 MED ORDER — TICAGRELOR 90 MG PO TABS
ORAL_TABLET | ORAL | Status: DC | PRN
  Administered 2018-06-09: 90 mg via ORAL

## 2018-06-09 MED ORDER — NITROGLYCERIN 1 MG/10 ML FOR IR/CATH LAB
INTRA_ARTERIAL | Status: DC | PRN
  Administered 2018-06-09 (×6): 200 ug via INTRACORONARY

## 2018-06-09 MED ORDER — TICAGRELOR 90 MG PO TABS
ORAL_TABLET | ORAL | Status: AC
  Filled 2018-06-09: qty 1

## 2018-06-09 MED ORDER — LIDOCAINE HCL (PF) 1 % IJ SOLN
INTRAMUSCULAR | Status: AC
  Filled 2018-06-09: qty 30

## 2018-06-09 MED ORDER — BIVALIRUDIN BOLUS VIA INFUSION - CUPID
INTRAVENOUS | Status: DC | PRN
  Administered 2018-06-09: 74.25 mg via INTRAVENOUS

## 2018-06-09 MED ORDER — NITROGLYCERIN 0.4 MG SL SUBL
SUBLINGUAL_TABLET | SUBLINGUAL | Status: AC
  Filled 2018-06-09: qty 1

## 2018-06-09 MED ORDER — HEPARIN (PORCINE) IN NACL 1000-0.9 UT/500ML-% IV SOLN
INTRAVENOUS | Status: AC
  Filled 2018-06-09: qty 500

## 2018-06-09 MED ORDER — NITROGLYCERIN IN D5W 200-5 MCG/ML-% IV SOLN
10.0000 ug/min | INTRAVENOUS | Status: DC
  Administered 2018-06-09: 15:00:00 10 ug/min via INTRAVENOUS
  Filled 2018-06-09: qty 250

## 2018-06-09 MED FILL — Heparin Sod (Porcine)-NaCl IV Soln 1000 Unit/500ML-0.9%: INTRAVENOUS | Qty: 500 | Status: AC

## 2018-06-09 SURGICAL SUPPLY — 34 items
BAG SNAP BAND KOVER 36X36 (MISCELLANEOUS) ×1 IMPLANT
BALLN EMERGE MR 2.75X12 (BALLOONS) ×2
BALLN SAPPHIRE 2.5X15 (BALLOONS) ×2
BALLN SAPPHIRE 3.0X15 (BALLOONS) ×2
BALLN SAPPHIRE ~~LOC~~ 2.75X12 (BALLOONS) ×1 IMPLANT
BALLN SAPPHIRE ~~LOC~~ 3.0X12 (BALLOONS) ×1 IMPLANT
BALLOON EMERGE MR 2.75X12 (BALLOONS) IMPLANT
BALLOON SAPPHIRE 2.5X15 (BALLOONS) IMPLANT
BALLOON SAPPHIRE 3.0X15 (BALLOONS) IMPLANT
CATH OPTICROSS 40MHZ (CATHETERS) ×1 IMPLANT
CATH TELEPORT (CATHETERS) ×1 IMPLANT
CATH VISTA GUIDE 6FR XBLAD3.5 (CATHETERS) ×1 IMPLANT
CROWN DIAMONDBACK CLASSIC 1.25 (BURR) ×1 IMPLANT
DEVICE RAD COMP TR BAND LRG (VASCULAR PRODUCTS) ×1 IMPLANT
ELECT DEFIB PAD ADLT CADENCE (PAD) ×1 IMPLANT
GLIDESHEATH SLEND A-KIT 6F 22G (SHEATH) ×1 IMPLANT
GLIDESHEATH SLEND SS 6F .021 (SHEATH) ×1 IMPLANT
GUIDEWIRE INQWIRE 1.5J.035X260 (WIRE) IMPLANT
INQWIRE 1.5J .035X260CM (WIRE) ×2
KIT ENCORE 26 ADVANTAGE (KITS) ×2 IMPLANT
KIT HEART LEFT (KITS) ×2 IMPLANT
KIT HEMO VALVE WATCHDOG (MISCELLANEOUS) ×1 IMPLANT
LUBRICANT VIPERSLIDE CORONARY (MISCELLANEOUS) ×1 IMPLANT
PACK CARDIAC CATHETERIZATION (CUSTOM PROCEDURE TRAY) ×2 IMPLANT
SHEATH PROBE COVER 6X72 (BAG) ×1 IMPLANT
SLED PULL BACK IVUS (MISCELLANEOUS) ×1 IMPLANT
STENT SYNERGY DES 2.75X16 (Permanent Stent) ×2 IMPLANT
STENT SYNERGY DES 3X38 (Permanent Stent) ×1 IMPLANT
TRANSDUCER W/STOPCOCK (MISCELLANEOUS) ×2 IMPLANT
TUBING CIL FLEX 10 FLL-RA (TUBING) ×2 IMPLANT
WIRE GUIDE ASAHI EXTENSION 165 (WIRE) ×1 IMPLANT
WIRE MINAMO 190 (WIRE) ×2 IMPLANT
WIRE SION BLUE 180 (WIRE) ×1 IMPLANT
WIRE VIPERWIRE COR FLEX .012 (WIRE) ×1 IMPLANT

## 2018-06-09 NOTE — CV Procedure (Signed)
Complex LAD/Diag bifurcation stenting. Full report to follow.  Nigel Mormon, MD [facility]. [place] Pager: [phone] Office: [phone] If no answer Cell [phone]

## 2018-06-09 NOTE — Progress Notes (Signed)
Subjective:  Doing well. Chest pain free. S/p staged PCI 06/09/2018.   Objective:  Vital Signs in the last 24 hours: Temp:  [98.1 F (36.7 C)-98.6 F (37 C)] 98.2 F (36.8 C) (01/06 2019) Pulse Rate:  [0-144] 73 (01/06 2019) Resp:  [10-64] 13 (01/06 2019) BP: (123-151)/(85-100) 134/92 (01/06 2019) SpO2:  [0 %-100 %] 97 % (01/06 2019) Weight:  [99 kg] 99 kg (01/06 0609)  Intake/Output from previous day: 01/05 0701 - 01/06 0700 In: 1869.1 [P.O.:540; I.V.:1329.1] Out: 1425 [Urine:1425] Intake/Output from this shift: Total I/O In: 240 [P.O.:240] Out: -   Physical Exam: Nursing note and vitals reviewed. Constitutional: He is oriented to person, place, and time. He appears well-developed and well-nourished.  HENT:  Head: Normocephalic and atraumatic.  Eyes: Pupils are equal, round, and reactive to light. Conjunctivae are normal.  Neck: No JVD seen.  Cardiovascular: Normal rate, regular rhythm, normal heart sounds and intact distal pulses. Small rt wrist hematoma.  No murmur heard. Respiratory: Effort normal and breath sounds normal. No respiratory distress. He has no wheezes.  GI: Soft. Bowel sounds are normal. He exhibits no distension. There is no abdominal tenderness.  Musculoskeletal:        General: No edema.  Lymphadenopathy:    He has no cervical adenopathy.  Neurological: He is alert and oriented to person, place, and time.  Skin: Skin is warm and dry.  Psychiatric: He has a normal mood and affect.     Lab Results: Recent Labs    06/08/18 0436 06/09/18 0722 06/09/18 0804  WBC 7.3  --  6.6  HGB 14.4 14.3 14.0  PLT 201  --  187   Recent Labs    06/08/18 0942 06/09/18 0722 06/09/18 0804  NA 137 140 136  K 4.1 4.3 3.9  CL 106 107 110  CO2 25  --  20*  GLUCOSE 87 80 87  BUN 15 15 13   CREATININE 1.49* 1.40* 1.41*   No results for input(s): TROPONINI in the last 72 hours.  Invalid input(s): CK, MB Hepatic Function Panel Recent Labs    06/09/18 0804   PROT 7.1  ALBUMIN 3.7  AST 44*  ALT 40  ALKPHOS 45  BILITOT 1.4*   No results for input(s): CHOL in the last 72 hours. Cardiac studies: Cath 06/07/2018: LM: Normal LAD: Prox-mid 80% calcific stenosis-->Will have staged intervention Ramus: Prox 40% disease LCx; Mild luminal irregularities RCA: Large, severely calcified vessel, distally undefiled.        Severe mid 99% stenosis--->Orsero 3.5 X 26 mm Orsero DES        Post dilatation with 4.0 mm [place] balloon        Severe prox 80% stenosis--->Orsero 3.5 X 9 mm DES        Post dilatation with 4.0 mm [place] balloon        Diffuse distal RCA, PDA moderate-severe disease, best treated medically Normal LVEDP  Recommendation: DAPT for 1 year Aggressive risk factor modification Staged LAD PCI with IVUS use and possible atherectomy.  EKG 06/06/2018: Sinus rhythm. Normal axis.  Normal conduction. Inferolateral T wave inversions.  Hospital echocardiogram 06/06/2018: - Left ventricle: The cavity size was normal. There was mild   concentric hypertrophy. Systolic function was normal. The   estimated ejection fraction was in the range of 55% to 60%. Wall   motion was normal; there were no regional wall motion   abnormalities. Doppler parameters are consistent with abnormal   left ventricular relaxation (grade 1 diastolic  dysfunction). - No significant valvular abnormality.  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  Non-STEMI: Severe multivessel CAD S/p Prox and mid RCA PCI 06/06/2018 & LAD?Diag bifurcation PCI 06/09/2018. Plan for staged PCI to mid LAD on 06/09/2018. Continue aspirin, Brilinta, Lipitor 80 mg, metoprolol 25 mg twice daily, amlodipine 5 mg daily.  Cardiac rehab after discharge.  Hyperlipidemia: Started on lipitor 80 mg daily. Will need monitoring of AST, ALT.  ?AKI/CKD: Resolved/  Continue overnight hydration in light of contrast use today.  Elevated AST, ALT, lipase: No abdominal symptoms at this time.  I discussed this with [facility] gastroenterologist Dr. Therisa Doyne. Recommend outpatient follow up.     LOS: 4 days    Arseniy Toomey J Krishana Lutze 06/09/2018, 8:21 PM  Oluwadara Gorman Esther Hardy, MD [facility]. [place] Pager: [phone] Office: [phone] If no answer Cell [phone]

## 2018-06-09 NOTE — Progress Notes (Signed)
TRBAND REMOVAL  LOCATION:    right radial  DEFLATED PER PROTOCOL:    Yes.    TIME BAND OFF / DRESSING APPLIED:    1530   SITE UPON ARRIVAL:    Level 0  SITE AFTER BAND REMOVAL:    Level 0  CIRCULATION SENSATION AND MOVEMENT:    Within Normal Limits   Yes.    COMMENTS:   Tolerated  Procedure well

## 2018-06-09 NOTE — Interval H&P Note (Signed)
History and Physical Interval Note:  06/09/2018 7:40 AM  William Hancock  has presented today for surgery, with the diagnosis of cad  The various methods of treatment have been discussed with the patient and family. After consideration of risks, benefits and other options for treatment, the patient has consented to  Procedure(s): CORONARY STENT INTERVENTION (N/A) as a surgical intervention .  The patient's history has been reviewed, patient examined, no change in status, stable for surgery.  I have reviewed the patient's chart and labs.  Questions were answered to the patient's satisfaction.    2016 Appropriate Use Criteria for Coronary Revascularization in Patients With Acute Coronary Syndrome NSTEMI/UA High Risk (TIMI Score 5-7) NSTEMI/Unstable angina, stabilized patient at high risk Link Here: sistemancia.com Indication:  Revascularization by PCI or CABG of 1 or more arteries in a patient with NSTEMI or unstable angina with Stabilization after presentation High risk for clinical events  A (7) Indication: 16; Score 7     [place]

## 2018-06-10 ENCOUNTER — Encounter (HOSPITAL_COMMUNITY): Payer: Self-pay | Admitting: Cardiology

## 2018-06-10 DIAGNOSIS — I214 Non-ST elevation (NSTEMI) myocardial infarction: Secondary | ICD-10-CM | POA: Diagnosis not present

## 2018-06-10 DIAGNOSIS — Z9861 Coronary angioplasty status: Secondary | ICD-10-CM

## 2018-06-10 DIAGNOSIS — I129 Hypertensive chronic kidney disease with stage 1 through stage 4 chronic kidney disease, or unspecified chronic kidney disease: Secondary | ICD-10-CM | POA: Diagnosis not present

## 2018-06-10 DIAGNOSIS — I25118 Atherosclerotic heart disease of native coronary artery with other forms of angina pectoris: Secondary | ICD-10-CM | POA: Diagnosis not present

## 2018-06-10 DIAGNOSIS — N179 Acute kidney failure, unspecified: Secondary | ICD-10-CM | POA: Diagnosis not present

## 2018-06-10 LAB — BASIC METABOLIC PANEL
Anion gap: 9 (ref 5–15)
BUN: 10 mg/dL (ref 6–20)
CHLORIDE: 107 mmol/L (ref 98–111)
CO2: 20 mmol/L — ABNORMAL LOW (ref 22–32)
Calcium: 9.1 mg/dL (ref 8.9–10.3)
Creatinine, Ser: 1.46 mg/dL — ABNORMAL HIGH (ref 0.61–1.24)
GFR calc Af Amer: >60 mL/min (ref >60)
GFR calc non Af Amer: 54 mL/min — ABNORMAL LOW (ref >60)
Glucose, Bld: 100 mg/dL — ABNORMAL HIGH (ref 70–99)
Potassium: 3.9 mmol/L (ref 3.5–5.1)
Sodium: 136 mmol/L (ref 135–145)

## 2018-06-10 MED ORDER — NITROGLYCERIN 0.4 MG SL SUBL: 0.4000 mg | SUBLINGUAL_TABLET | SUBLINGUAL | 0 refills | Status: DC | PRN

## 2018-06-10 MED ORDER — TICAGRELOR 90 MG PO TABS: 90.0000 mg | ORAL_TABLET | Freq: Two times a day (BID) | ORAL | 0 refills | Status: DC

## 2018-06-10 MED ORDER — METOPROLOL SUCCINATE ER 50 MG PO TB24: 50.0000 mg | ORAL_TABLET | Freq: Every day | ORAL | 0 refills | Status: DC

## 2018-06-10 MED ORDER — ASPIRIN 81 MG PO CHEW: 81.0000 mg | CHEWABLE_TABLET | Freq: Every day | ORAL | 0 refills | Status: DC

## 2018-06-10 MED ORDER — METOPROLOL SUCCINATE ER 50 MG PO TB24
50.0000 mg | ORAL_TABLET | Freq: Every day | ORAL | Status: DC
  Administered 2018-06-10: 08:00:00 50 mg via ORAL
  Filled 2018-06-10: qty 1

## 2018-06-10 MED ORDER — ATORVASTATIN CALCIUM 80 MG PO TABS: 80.0000 mg | ORAL_TABLET | Freq: Every day | ORAL | 3 refills | Status: DC

## 2018-06-10 MED ORDER — AMLODIPINE BESYLATE 5 MG PO TABS: 5.0000 mg | ORAL_TABLET | Freq: Every day | ORAL | 0 refills | Status: DC

## 2018-06-10 MED FILL — Nitroglycerin IV Soln 100 MCG/ML in D5W: INTRA_ARTERIAL | Qty: 10 | Status: AC

## 2018-06-10 MED FILL — ATORVASTATIN CALCIUM 80 MG: 80 | 90 days supply | Qty: 90 | Fill #0 | Status: TO

## 2018-06-10 MED FILL — METOPROLOL SUCCINATE ER 50: 50 | 30 days supply | Qty: 30 | Fill #0

## 2018-06-10 MED FILL — BRILINTA 90 MG TABLET: 90 | 30 days supply | Qty: 60 | Fill #0

## 2018-06-10 MED FILL — NITROGLYCERIN 0.4 MG TAB SL: 0.4 | 8 days supply | Qty: 25 | Fill #0

## 2018-06-10 MED FILL — AMLODIPINE BESYLATE 5 MG TA: 5 | 30 days supply | Qty: 30 | Fill #0

## 2018-06-10 MED FILL — ASPIRIN LOW DOSE 81 MG CHEW: 81 | 30 days supply | Qty: 30 | Fill #0

## 2018-06-10 NOTE — Progress Notes (Signed)
CARDIAC REHAB PHASE I   PRE:  Rate/Rhythm: 74 SR  BP:  Supine: 131/103  Sitting:   Standing:    SaO2:   MODE:  Ambulation: 600 ft   POST:  Rate/Rhythm: 78 SR  BP:  Supine:   Sitting: 142/105  Standing:    SaO2:  0830-0845 Pt walked 600 ft on RA. Tolerated well with no CP.  BP elevated this morning.   Graylon Good, RN BSN  06/10/2018 8:42 AM

## 2018-06-10 NOTE — Discharge Summary (Signed)
Physician Discharge Summary  Patient ID: William Hancock MRN: [id number] DOB/AGE: 1966-04-28 53 y.o.  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 EF preserved on echocardiogram. Patient udnerwent complex PCI to RCA on 06/06/2018 and LAD/Diag bifurcation on 06/10/2018. Patient ambulated without chest pain on 06/10/2018. He was discharged home on DAPT, lipitor, metoprolol succinate, and amlodipine. He was seen by cardiac rehab. He will be seen for transition of care visit on 06/16/2018.   Consults: None  Significant Diagnostic Studies:  Results for ALESSIO, BOGAN (MRN [id number]) as of 06/10/2018 11:18  Ref. Range 06/08/2018 09:42 06/09/2018 07:22 06/09/2018 08:04 06/10/2018 93:81  BASIC METABOLIC PANEL Unknown Rpt (A)   Rpt (A)  COMPREHENSIVE METABOLIC PANEL Unknown   Rpt (A)   Sodium Latest Ref Range: 135 - 145 mmol/L 137 140 136 136  Potassium Latest Ref Range: 3.5 - 5.1 mmol/L 4.1 4.3 3.9 3.9  Chloride Latest Ref Range: 98 - 111 mmol/L 106 107 110 107  CO2 Latest Ref Range: 22 - 32 mmol/L 25  20 (L) 20 (L)  Glucose Latest Ref Range: 70 - 99 mg/dL 87 80 87 100 (H)  BUN Latest Ref Range: 6 - 20 mg/dL 15 15 13 10   Creatinine Latest Ref Range: 0.61 - 1.24 mg/dL 1.49 (H) 1.40 (H) 1.41 (H) 1.46 (H)  Calcium Latest Ref Range: 8.9 - 10.3 mg/dL 9.4  9.0 9.1  Anion gap Latest Ref Range: 5 - 15  6  6 9   Calcium Ionized Latest Ref Range: 1.15 - 1.40 mmol/L  1.24    Alkaline Phosphatase Latest Ref Range: 38 - 126 U/L   45   Albumin Latest Ref Range: 3.5 - 5.0 g/dL   3.7   AST Latest Ref  Range: 15 - 41 U/L   44 (H)   ALT Latest Ref Range: 0 - 44 U/L   40   Total Protein Latest Ref Range: 6.5 - 8.1 g/dL   7.1   Total Bilirubin Latest Ref Range: 0.3 - 1.2 mg/dL   1.4 (H)   GFR, Est Non African American Latest Ref Range: >60 mL/min 52 (L)  56 (L) 54 (L)  GFR, Est African American Latest Ref Range: >60 mL/min >60  >60 >60   Results for GRANVEL, PROUDFOOT (MRN [id number]) as of 06/10/2018 11:18  Ref. Range 06/06/2018 05:10  Troponin I Latest Ref Range: <0.03 ng/mL 5.16 [facility])    Hospital echocardiogram 06/06/2018: - Left ventricle: The cavity size was normal. There was mild   concentric hypertrophy. Systolic function was normal. The   estimated ejection fraction was in the range of 55% to 60%. Wall   motion was normal; there were no regional wall motion   abnormalities. Doppler parameters are consistent with abnormal   left ventricular relaxation (grade 1 diastolic dysfunction). - No significant valvular abnormality.  Treatments: LM: Normal  LAD: Prox to mid LAD/Diag1 bifurcation severe calcific 80% stenosis        Diag 1 mid 75% stenosis        Atherectomy and prox LAD into Diag         Synergy DES 2.75 X 16 mm DES  Overlapping Stent into prox LAD Synergy DES 3.0 X 38 mm        Provisional stent mid LAD with minicrush technique Synergy DES 2.75 X 16 mm DES Ramus; 50 % proximal disease LCx: Mild luminal irregularities RCA: Not engaged today. Stenting performed 06/06/2018        Severe mid 99% stenosis--->Orsero 3.5 X 26 mm Orsero DES  Post dilatation with 4.0 mm [place] balloon Severe prox 80% stenosis--->Orsero 3.5 X 9 mm DES Post dilatation with 4.0 mm [place] balloon  Discharge Exam: Blood pressure 125/74, pulse (!) 58, temperature 97.6 F (36.4 C), temperature source Oral, resp. rate 16, height 5\' 11"  (1.803 m), weight 98 kg, SpO2 96 %. Nursing noteand vitalsreviewed. Constitutional: He isoriented to person, place, and time. He  appearswell-developedand well-nourished.  HENT:  Head:Normocephalicand atraumatic.  Eyes:Pupils are equal, round, and reactive to light.Conjunctivaeare normal.  Neck:No JVD seen.  Cardiovascular:Normal rate,regular rhythm,normal heart soundsand intact distal pulses.Small rt wrist hematoma.  No murmurheard. Respiratory:Effort normaland breath sounds normal. Norespiratory distress. He hasno wheezes.  KZ:SWFU.Bowel sounds are normal. He exhibitsno distension. There isno abdominal tenderness.  Musculoskeletal:  General: No edema.  Lymphadenopathy:  He has no cervical adenopathy.  Neurological: He isalertand oriented to person, place, and time.  Skin: Skin iswarmand dry.  Psychiatric: He has anormal mood and affect.   Disposition: Discharge disposition: 01-Home or Self Care       Discharge Instructions    AMB Referral to Cardiac Rehabilitation - Phase II   Complete by:  As directed    Diagnosis:  Coronary Stents   Amb Referral to Cardiac Rehabilitation   Complete by:  As directed    Diagnosis:   NSTEMI Coronary Stents     Diet - low sodium heart healthy   Complete by:  As directed    Increase activity slowly   Complete by:  As directed      Allergies as of 06/10/2018      Reactions   Penicillins    DID THE REACTION INVOLVE: Swelling of the face/tongue/throat, SOB, or low BP? N Sudden or severe rash/hives, skin peeling, or the inside of the mouth or nose? N Did it require medical treatment? N When did it last happen? If all above answers are "NO", may proceed with cephalosporin use.      Medication List    STOP taking these medications   LOTREL 5-10 MG capsule Generic drug:  amLODipine-benazepril     TAKE these medications   amLODipine 5 MG tablet Commonly known as:  NORVASC Take 1 tablet (5 mg total) by mouth daily.   aspirin 81 MG chewable tablet Chew 1 tablet (81 mg total) by mouth daily.   atorvastatin 80 MG  tablet Commonly known as:  LIPITOR Take 1 tablet (80 mg total) by mouth daily at 6 PM.   metoprolol succinate 50 MG 24 hr tablet Commonly known as:  TOPROL-XL Take 1 tablet (50 mg total) by mouth daily. Take with or immediately following a meal.   nitroGLYCERIN 0.4 MG SL tablet Commonly known as:  NITROSTAT Place 1 tablet (0.4 mg total) under the tongue every 5 (five) minutes x 3 doses as needed for chest pain.   ticagrelor 90 MG Tabs tablet Commonly known as:  BRILINTA Take 1 tablet (90 mg total) by mouth 2 (two) times daily.      Follow-up Information    Nigel Mormon, MD Follow up on 06/16/2018.   Specialty:  Cardiology Why:  10:15 AM Contact  information: [place] [address] [phone]           Signed: Nigel Mormon 06/10/2018, 11:10 AM  Nigel Mormon, MD [facility]. [place] Pager: [phone] Office: [phone] If no answer Cell [phone]

## 2018-06-16 DIAGNOSIS — I10 Essential (primary) hypertension: Secondary | ICD-10-CM | POA: Diagnosis not present

## 2018-06-16 DIAGNOSIS — I2510 Atherosclerotic heart disease of native coronary artery without angina pectoris: Secondary | ICD-10-CM | POA: Diagnosis not present

## 2018-06-16 DIAGNOSIS — E782 Mixed hyperlipidemia: Secondary | ICD-10-CM | POA: Diagnosis not present

## 2018-06-16 DIAGNOSIS — I252 Old myocardial infarction: Secondary | ICD-10-CM | POA: Diagnosis not present

## 2018-06-17 ENCOUNTER — Telehealth (HOSPITAL_COMMUNITY): Payer: Self-pay

## 2018-06-17 NOTE — Telephone Encounter (Signed)
Attempted to call patient in regards to Cardiac Rehab - LM on VM 

## 2018-06-17 NOTE — Telephone Encounter (Signed)
Pt insurance is active and benefits verified through [place]. Co-pay $0.00, DED $350.00/$350.00 met, out of pocket $5,000.00/$384.19 met, co-insurance 35%. No pre-authorization required. Passport, 06/17/2018 @ 10:42AM, REF# [id number]  Will contact patient to see if he is interested in the Cardiac Rehab Program. If interested, patient will need to complete follow up appt. Once completed, patient will be contacted for scheduling upon review by the RN Navigator

## 2018-06-23 NOTE — Progress Notes (Signed)
Transitions of Care Follow Up Call Note  William Hancock is an 53 y.o. male who presented to [facility] on 06/05/2018.  The patient had the following prescriptions filled at [facility]: Aspirin 81mg , amlodipine, atorvastatin, metoprolol, nitrostat, Brilinta  Patient was called by pharmacist and HIPAA identifiers were verified. The following questions were asked about the prescriptions filled at [facility]:  Has the patient been experiencing any side effects to the medications prescribed? no Understanding of regimen: excellent Understanding of indications: excellent Potential of compliance: excellent  Pharmacist comments: Patient doing well. Adherent to all medications.    [x]  Patient's prescriptions with refills filled at the [facility] Transitions of Care Pharmacy were transferred to the following pharmacy: Kristopher Oppenheim at [facility]  []  Patient unable to be reached after calling three times and prescriptions filled at the [facility] Transitions of Care Pharmacy were transferred to preferred pharmacy found within their chart.   ANNA K LOVE 06/23/2018, 6:08 PM Transitions of Care Pharmacy Hours: Monday - Friday 8:30am to 5:00 PM  Phone - [phone]

## 2018-06-24 ENCOUNTER — Telehealth (HOSPITAL_COMMUNITY): Payer: Self-pay

## 2018-06-24 NOTE — Telephone Encounter (Signed)
Attempted to call patient in regards to Cardiac Rehab - LM on VM 

## 2018-06-26 DIAGNOSIS — Z23 Encounter for immunization: Secondary | ICD-10-CM | POA: Diagnosis not present

## 2018-06-26 DIAGNOSIS — I10 Essential (primary) hypertension: Secondary | ICD-10-CM | POA: Diagnosis not present

## 2018-06-26 DIAGNOSIS — E7800 Pure hypercholesterolemia, unspecified: Secondary | ICD-10-CM | POA: Diagnosis not present

## 2018-06-26 DIAGNOSIS — I2510 Atherosclerotic heart disease of native coronary artery without angina pectoris: Secondary | ICD-10-CM | POA: Diagnosis not present

## 2018-07-07 NOTE — Telephone Encounter (Signed)
Pt returned CR phone call and stated he would like to participate in our program. Patient will come in for orientation on 08/05/2018 @ 730AM and will attend the 645AM exercise class. went over insurance, patient verbalized understanding.   Mailed homework package.

## 2018-07-09 DIAGNOSIS — R748 Abnormal levels of other serum enzymes: Secondary | ICD-10-CM | POA: Diagnosis not present

## 2018-07-09 DIAGNOSIS — R198 Other specified symptoms and signs involving the digestive system and abdomen: Secondary | ICD-10-CM | POA: Diagnosis not present

## 2018-07-29 ENCOUNTER — Telehealth (HOSPITAL_COMMUNITY): Payer: Self-pay | Admitting: Pharmacist

## 2018-07-31 NOTE — Telephone Encounter (Signed)
Cardiac Rehab - Pharmacy Resident Documentation   Patient unable to be reached after three call attempts. Please complete allergy verification and medication review during patient's cardiac rehab appointment.    Gwenlyn Found, Sherian Rein D PGY1 Pharmacy Resident  Phone [phone] 07/31/2018   1:42 PM

## 2018-08-01 NOTE — Progress Notes (Addendum)
William Hancock 53 y.o. male DOB [birth year] MRN [id number]       Nutrition Screen Note  No diagnosis found. Past Medical History:  Diagnosis Date  . Coronary artery disease   . Hyperlipidemia   . Hypertension   . Myocardial infarction [facility])    Meds reviewed.     Current Outpatient Medications (Cardiovascular):  .  amLODipine (NORVASC) 5 MG tablet, Take 1 tablet (5 mg total) by mouth daily. Marland Kitchen  atorvastatin (LIPITOR) 80 MG tablet, Take 1 tablet (80 mg total) by mouth daily at 6 PM. .  metoprolol succinate (TOPROL-XL) 50 MG 24 hr tablet, Take 1 tablet (50 mg total) by mouth daily. Take with or immediately following a meal. .  nitroGLYCERIN (NITROSTAT) 0.4 MG SL tablet, Place 1 tablet (0.4 mg total) under the tongue every 5 (five) minutes x 3 doses as needed for chest pain.   Current Outpatient Medications (Analgesics):  .  aspirin 81 MG chewable tablet, Chew 1 tablet (81 mg total) by mouth daily.  Current Outpatient Medications (Hematological):  .  ticagrelor (BRILINTA) 90 MG TABS tablet, Take 1 tablet (90 mg total) by mouth 2 (two) times daily.    HT: Ht Readings from Last 1 Encounters:  06/09/18 5\' 11"  (1.803 m)    WT: Wt Readings from Last 5 Encounters:  06/10/18 216 lb 0.8 oz (98 kg)     BMI = 30.13   (06/10/18)   Current tobacco use? No       Labs:  Lipid Panel     Component Value Date/Time   CHOL 235 (H) 06/06/2018 0222   TRIG 133 06/06/2018 0222   HDL 50 06/06/2018 0222   CHOLHDL 4.7 06/06/2018 0222   VLDL 27 06/06/2018 0222   LDLCALC 158 (H) 06/06/2018 0222    Lab Results  Component Value Date   HGBA1C 5.1 06/06/2018   CBG (last 3)  No results for input(s): GLUCAP in the last 72 hours.  Nutrition Diagnosis ? Food-and nutrition-related knowledge deficit related to lack of exposure to information as related to diagnosis of: ? CVD  ? Obese  I = 30-34.9 related to excessive energy intake as evidenced by a 30.13  Nutrition Goal(s):  ? Pt to build a  healthy plate, including non-starchy vegetables, fruit, lean protein, low fat dairy, and whole grains. ? Pt to weight and measure portions for accuracy   Plan:  Pt to attend nutrition classes ? Nutrition I ? Nutrition II ? Portion Distortion Will provide client-centered nutrition education as part of interdisciplinary care.   Monitor and evaluate progress toward nutrition goal with team.  Laurina Bustle, MS, RD, LDN 08/01/2018 11:32 AM

## 2018-08-05 ENCOUNTER — Encounter (HOSPITAL_COMMUNITY): Payer: Self-pay

## 2018-08-05 ENCOUNTER — Encounter (HOSPITAL_COMMUNITY)
Admission: RE | Admit: 2018-08-05 | Discharge: 2018-08-05 | Disposition: A | Discharge status: Home / Self Care | Payer: Federal, State, Local not specified - PPO | Source: Ambulatory Visit | Attending: Cardiology | Admitting: Cardiology

## 2018-08-05 VITALS — Ht 71.0 in | Wt 222.0 lb

## 2018-08-05 DIAGNOSIS — I214 Non-ST elevation (NSTEMI) myocardial infarction: Secondary | ICD-10-CM | POA: Insufficient documentation

## 2018-08-05 DIAGNOSIS — Z955 Presence of coronary angioplasty implant and graft: Secondary | ICD-10-CM | POA: Insufficient documentation

## 2018-08-05 NOTE — Progress Notes (Signed)
Cardiac Rehab Medication Review by a Registered Nurse   Does the patient  feel that his/her medications are working for him/her?  yes  Has the patient been experiencing any side effects to the medications prescribed?  no  Does the patient measure his/her own blood pressure or blood glucose at home?  yes   Does the patient have any problems obtaining medications due to transportation or finances?   no  Understanding of regimen: good Understanding of indications: good Potential of compliance: good    RN comments: pt demonstrates good compliance and understanding of medication regimen. Pt does express concern about feeling "brain fog" since starting new medications. Pt eager to start aerobic exercise program in hopes to improve this symptom. Pt encouraged to participate in Cardiac Drugs Pharmacy education offerings at cardiac rehab. Pt  Interested in attending.  Understanding verbalized. William Hence, RN, BSN Cardiac Pulmonary Rehab     [name] 08/05/2018 9:24 AM

## 2018-08-05 NOTE — Progress Notes (Signed)
Cardiac Individual Treatment Plan  Patient Details  Name: William Hancock MRN: [id number] Date of Birth: [birth year] Referring Provider:   Flowsheet Row CARDIAC REHAB PHASE II ORIENTATION from 08/05/2018 in [place]  Referring Provider  Dr. Virgina Jock      Initial Encounter Date:  Flowsheet Row CARDIAC REHAB PHASE II ORIENTATION from 08/05/2018 in [place]  Date  08/05/18      Visit Diagnosis: 06/05/18 NSTEMI  06/05/18 DES RCA, 06/09/18 DES LAD/Diag bifurcation  Patient's Home Medications on Admission:  Current Outpatient Medications:  .  amLODipine (NORVASC) 5 MG tablet, Take 1 tablet (5 mg total) by mouth daily., Disp: 30 tablet, Rfl: 0 .  aspirin 81 MG chewable tablet, Chew 1 tablet (81 mg total) by mouth daily., Disp: 30 tablet, Rfl: 0 .  atorvastatin (LIPITOR) 80 MG tablet, Take 1 tablet (80 mg total) by mouth daily at 6 PM., Disp: 90 tablet, Rfl: 3 .  metoprolol succinate (TOPROL-XL) 50 MG 24 hr tablet, Take 1 tablet (50 mg total) by mouth daily. Take with or immediately following a meal., Disp: 30 tablet, Rfl: 0 .  nitroGLYCERIN (NITROSTAT) 0.4 MG SL tablet, Place 1 tablet (0.4 mg total) under the tongue every 5 (five) minutes x 3 doses as needed for chest pain., Disp: 30 tablet, Rfl: 0 .  ticagrelor (BRILINTA) 90 MG TABS tablet, Take 1 tablet (90 mg total) by mouth 2 (two) times daily., Disp: 60 tablet, Rfl: 0  Past Medical History: Past Medical History:  Diagnosis Date  . Coronary artery disease   . Hyperlipidemia   . Hypertension   . Myocardial infarction ([place])     Tobacco Use: Social History   Tobacco Use  Smoking Status Former Smoker  . Last attempt to quit: 06/05/1998  . Years since quitting: 20.1  Smokeless Tobacco Never Used    Labs: Recent Review Scientist, physiological    Labs for ITP Cardiac and Pulmonary Rehab Latest Ref Rng & Units 06/06/2018 06/09/2018   Cholestrol 0 - 200 mg/dL 235(H) -   LDLCALC 0 - 99  mg/dL 158(H) -   HDL >40 mg/dL 50 -   Trlycerides <150 mg/dL 133 -   Hemoglobin A1c 4.8 - 5.6 % 5.1 -   TCO2 22 - 32 mmol/L - 24      Capillary Blood Glucose: No results found for: GLUCAP   Exercise Target Goals: Exercise Program Goal: Individual exercise prescription set using results from initial 6 min walk test and THRR while considering  patient's activity barriers and safety.   Exercise Prescription Goal: Initial exercise prescription builds to 30-45 minutes a day of aerobic activity, 2-3 days per week.  Home exercise guidelines will be given to patient during program as part of exercise prescription that the participant will acknowledge.  Activity Barriers & Risk Stratification: Activity Barriers & Cardiac Risk Stratification - 08/05/18 0856    Activity Barriers & Cardiac Risk Stratification          Activity Barriers  None    Cardiac Risk Stratification  High           6 Minute Walk: 6 Minute Walk    6 Minute Walk    Row Name 08/05/18 0743   Phase  Initial   Distance  1851 feet   Walk Time  6 minutes   # of Rest Breaks  0   MPH  3.51   METS  4.31   RPE  11   Perceived  Dyspnea   0   VO2 Peak  15.07   Symptoms  No   Resting HR  79 bpm   Resting BP  120/70   Resting Oxygen Saturation   98 %   Exercise Oxygen Saturation  during 6 min walk  98 %   Max Ex. HR  80 bpm   Max Ex. BP  128/64   2 Minute Post BP  130/80          Oxygen Initial Assessment:   Oxygen Re-Evaluation:   Oxygen Discharge (Final Oxygen Re-Evaluation):   Initial Exercise Prescription: Initial Exercise Prescription - 08/05/18 0900    Date of Initial Exercise RX and Referring Provider          Date  08/05/18    Referring Provider  Dr. Virgina Jock    Expected Discharge Date  11/10/18        Treadmill          MPH  4    Grade  0    Minutes  10        Bike          Level  1.6    Minutes  10    METs  4.02        Rower          Level  3    Watts  60    Minutes  10         Prescription Details          Frequency (times per week)  3    Duration  Progress to 30 minutes of continuous aerobic without signs/symptoms of physical distress        Intensity          THRR 40-80% of Max Heartrate  66-132    Ratings of Perceived Exertion  11-13        Progression          Progression  Continue to progress workloads to maintain intensity without signs/symptoms of physical distress.        Resistance Training          Training Prescription  Yes    Weight  6 lbs.     Reps  10-15           Perform Capillary Blood Glucose checks as needed.  Exercise Prescription Changes:   Exercise Comments:   Exercise Goals and Review: Exercise Goals    Exercise Goals    Row Name 08/05/18 0844   Increase Physical Activity  Yes   Intervention  Provide advice, education, support and counseling about physical activity/exercise needs.;Develop an individualized exercise prescription for aerobic and resistive training based on initial evaluation findings, risk stratification, comorbidities and participant's personal goals.   Expected Outcomes  Short Term: Attend rehab on a regular basis to increase amount of physical activity.;Long Term: Add in home exercise to make exercise part of routine and to increase amount of physical activity.;Long Term: Exercising regularly at least 3-5 days a week.   Increase Strength and Stamina  Yes   Intervention  Provide advice, education, support and counseling about physical activity/exercise needs.;Develop an individualized exercise prescription for aerobic and resistive training based on initial evaluation findings, risk stratification, comorbidities and participant's personal goals.   Expected Outcomes  Short Term: Increase workloads from initial exercise prescription for resistance, speed, and METs.;Long Term: Improve cardiorespiratory fitness, muscular endurance and strength as measured by increased METs and functional capacity  (6MWT)   Able to  understand and use rate of perceived exertion (RPE) scale  Yes   Intervention  Provide education and explanation on how to use RPE scale   Expected Outcomes  Short Term: Able to use RPE daily in rehab to express subjective intensity level;Long Term:  Able to use RPE to guide intensity level when exercising independently   Knowledge and understanding of Target Heart Rate Range (THRR)  Yes   Intervention  Provide education and explanation of THRR including how the numbers were predicted and where they are located for reference   Expected Outcomes  Short Term: Able to state/look up THRR;Long Term: Able to use THRR to govern intensity when exercising independently;Short Term: Able to use daily as guideline for intensity in rehab   Able to check pulse independently  Yes   Intervention  Provide education and demonstration on how to check pulse in carotid and radial arteries.;Review the importance of being able to check your own pulse for safety during independent exercise   Expected Outcomes  Short Term: Able to explain why pulse checking is important during independent exercise;Long Term: Able to check pulse independently and accurately   Understanding of Exercise Prescription  Yes   Intervention  Provide education, explanation, and written materials on patient's individual exercise prescription   Expected Outcomes  Short Term: Able to explain program exercise prescription;Long Term: Able to explain home exercise prescription to exercise independently          Exercise Goals Re-Evaluation :   Discharge Exercise Prescription (Final Exercise Prescription Changes):   Nutrition:  Target Goals: Understanding of nutrition guidelines, daily intake of sodium 1500mg , cholesterol 200mg , calories 30% from fat and 7% or less from saturated fats, daily to have 5 or more servings of fruits and vegetables.  Biometrics: Pre Biometrics - 08/05/18 0856    Pre Biometrics          Height  5'  11" (1.803 m)    Weight  100.7 kg    Waist Circumference  42 inches    Hip Circumference  42 inches    Waist to Hip Ratio  1 %    BMI (Calculated)  30.98    Triceps Skinfold  26 mm    % Body Fat  31.2 %    Grip Strength  44 kg    Flexibility  10.5 in    Single Leg Stand  17.93 seconds            Nutrition Therapy Plan and Nutrition Goals:   Nutrition Assessments:   Nutrition Goals Re-Evaluation:   Nutrition Goals Re-Evaluation:   Nutrition Goals Discharge (Final Nutrition Goals Re-Evaluation):   Psychosocial: Target Goals: Acknowledge presence or absence of significant depression and/or stress, maximize coping skills, provide positive support system. Participant is able to verbalize types and ability to use techniques and skills needed for reducing stress and depression.  Initial Review & Psychosocial Screening: Initial Psych Review & Screening - 08/05/18 0941    Initial Review          Current issues with  None Identified        Family Dynamics          Good Support System?  Yes   wife, children, family and friends        Barriers          Psychosocial barriers to participate in program  There are no identifiable barriers or psychosocial needs.        Screening Interventions  Interventions  Encouraged to exercise           Quality of Life Scores: Quality of Life - 08/05/18 0837    Quality of Life          Select  Quality of Life        Quality of Life Scores          Health/Function Pre  25.6 %    Socioeconomic Pre  28.29 %    Psych/Spiritual Pre  29.14 %    Family Pre  28.8 %    GLOBAL Pre  27.35 %          Scores of 19 and below usually indicate a poorer quality of life in these areas.  A difference of  2-3 points is a clinically meaningful difference.  A difference of 2-3 points in the total score of the Quality of Life Index has been associated with significant improvement in overall quality of life, self-image, physical  symptoms, and general health in studies assessing change in quality of life.  PHQ-9: Recent Review Flowsheet Data    There is no flowsheet data to display.     Interpretation of Total Score  Total Score Depression Severity:  1-4 = Minimal depression, 5-9 = Mild depression, 10-14 = Moderate depression, 15-19 = Moderately severe depression, 20-27 = Severe depression   Psychosocial Evaluation and Intervention:   Psychosocial Re-Evaluation:   Psychosocial Discharge (Final Psychosocial Re-Evaluation):   Vocational Rehabilitation: Provide vocational rehab assistance to qualifying candidates.   Vocational Rehab Evaluation & Intervention: Vocational Rehab - 08/05/18 0943    Initial Vocational Rehab Evaluation & Intervention          Assessment shows need for Vocational Rehabilitation  No   [employer] [place] [employer]          Education: Education Goals: Education classes will be provided on a weekly basis, covering required topics. Participant will state understanding/return demonstration of topics presented.  Learning Barriers/Preferences: Learning Barriers/Preferences - 08/05/18 0857    Learning Barriers/Preferences          Learning Barriers  Sight    Learning Preferences  Skilled Demonstration           Education Topics: Count Your Pulse:  -Group instruction provided by verbal instruction, demonstration, patient participation and written materials to support subject.  Instructors address importance of being able to find your pulse and how to count your pulse when at home without a heart monitor.  Patients get hands on experience counting their pulse with staff help and individually.   Heart Attack, Angina, and Risk Factor Modification:  -Group instruction provided by verbal instruction, video, and written materials to support subject.  Instructors address signs and symptoms of angina and heart attacks.    Also discuss risk factors for heart disease and how to make changes to  improve heart health risk factors.   Functional Fitness:  -Group instruction provided by verbal instruction, demonstration, patient participation, and written materials to support subject.  Instructors address safety measures for doing things around the house.  Discuss how to get up and down off the floor, how to pick things up properly, how to safely get out of a chair without assistance, and balance training.   Meditation and Mindfulness:  -Group instruction provided by verbal instruction, patient participation, and written materials to support subject.  Instructor addresses importance of mindfulness and meditation practice to help reduce stress and improve awareness.  Instructor also leads participants through a meditation exercise.  Stretching for Flexibility and Mobility:  -Group instruction provided by verbal instruction, patient participation, and written materials to support subject.  Instructors lead participants through series of stretches that are designed to increase flexibility thus improving mobility.  These stretches are additional exercise for major muscle groups that are typically performed during regular warm up and cool down.   Hands Only CPR:  -Group verbal, video, and participation provides a basic overview of AHA guidelines for community CPR. Role-play of emergencies allow participants the opportunity to practice calling for help and chest compression technique with discussion of AED use.   Hypertension: -Group verbal and written instruction that provides a basic overview of hypertension including the most recent diagnostic guidelines, risk factor reduction with self-care instructions and medication management.    Nutrition I class: Heart Healthy Eating:  -Group instruction provided by PowerPoint slides, verbal discussion, and written materials to support subject matter. The instructor gives an explanation and review of the Therapeutic Lifestyle Changes diet  recommendations, which includes a discussion on lipid goals, dietary fat, sodium, fiber, plant stanol/sterol esters, sugar, and the components of a well-balanced, healthy diet.   Nutrition II class: Lifestyle Skills:  -Group instruction provided by PowerPoint slides, verbal discussion, and written materials to support subject matter. The instructor gives an explanation and review of label reading, grocery shopping for heart health, heart healthy recipe modifications, and ways to make healthier choices when eating out.   Diabetes Question & Answer:  -Group instruction provided by PowerPoint slides, verbal discussion, and written materials to support subject matter. The instructor gives an explanation and review of diabetes co-morbidities, pre- and post-prandial blood glucose goals, pre-exercise blood glucose goals, signs, symptoms, and treatment of hypoglycemia and hyperglycemia, and foot care basics.   Diabetes Blitz:  -Group instruction provided by PowerPoint slides, verbal discussion, and written materials to support subject matter. The instructor gives an explanation and review of the physiology behind type 1 and type 2 diabetes, diabetes medications and rational behind using different medications, pre- and post-prandial blood glucose recommendations and Hemoglobin A1c goals, diabetes diet, and exercise including blood glucose guidelines for exercising safely.    Portion Distortion:  -Group instruction provided by PowerPoint slides, verbal discussion, written materials, and food models to support subject matter. The instructor gives an explanation of serving size versus portion size, changes in portions sizes over the last 20 years, and what consists of a serving from each food group.   Stress Management:  -Group instruction provided by verbal instruction, video, and written materials to support subject matter.  Instructors review role of stress in heart disease and how to cope with stress  positively.     Exercising on Your Own:  -Group instruction provided by verbal instruction, power point, and written materials to support subject.  Instructors discuss benefits of exercise, components of exercise, frequency and intensity of exercise, and end points for exercise.  Also discuss use of nitroglycerin and activating EMS.  Review options of places to exercise outside of rehab.  Review guidelines for sex with heart disease.   Cardiac Drugs I:  -Group instruction provided by verbal instruction and written materials to support subject.  Instructor reviews cardiac drug classes: antiplatelets, anticoagulants, beta blockers, and statins.  Instructor discusses reasons, side effects, and lifestyle considerations for each drug class.   Cardiac Drugs II:  -Group instruction provided by verbal instruction and written materials to support subject.  Instructor reviews cardiac drug classes: angiotensin converting enzyme inhibitors (ACE-I), angiotensin II receptor blockers (ARBs), nitrates,  and calcium channel blockers.  Instructor discusses reasons, side effects, and lifestyle considerations for each drug class.   Anatomy and Physiology of the Circulatory System:  Group verbal and written instruction and models provide basic cardiac anatomy and physiology, with the coronary electrical and arterial systems. Review of: AMI, Angina, Valve disease, Heart Failure, Peripheral Artery Disease, Cardiac Arrhythmia, Pacemakers, and the ICD.   Other Education:  -Group or individual verbal, written, or video instructions that support the educational goals of the cardiac rehab program.   Holiday Eating Survival Tips:  -Group instruction provided by PowerPoint slides, verbal discussion, and written materials to support subject matter. The instructor gives patients tips, tricks, and techniques to help them not only survive but enjoy the holidays despite the onslaught of food that accompanies the  holidays.   Knowledge Questionnaire Score: Knowledge Questionnaire Score - 08/05/18 0837    Knowledge Questionnaire Score          Pre Score  22/24           Core Components/Risk Factors/Patient Goals at Admission: Personal Goals and Risk Factors at Admission - 08/05/18 0843    Core Components/Risk Factors/Patient Goals on Admission           Weight Management  Yes;Obesity    Intervention  Weight Management: Develop a combined nutrition and exercise program designed to reach desired caloric intake, while maintaining appropriate intake of nutrient and fiber, sodium and fats, and appropriate energy expenditure required for the weight goal.;Weight Management: Provide education and appropriate resources to help participant work on and attain dietary goals.;Weight Management/Obesity: Establish reasonable short term and long term weight goals.;Obesity: Provide education and appropriate resources to help participant work on and attain dietary goals.    Admit Weight  222 lb 0.1 oz (100.7 kg)    Expected Outcomes  Short Term: Continue to assess and modify interventions until short term weight is achieved;Long Term: Adherence to nutrition and physical activity/exercise program aimed toward attainment of established weight goal;Weight Loss: Understanding of general recommendations for a balanced deficit meal plan, which promotes 1-2 lb weight loss per week and includes a negative energy balance of [id number] kcal/d;Understanding recommendations for meals to include 15-35% energy as protein, 25-35% energy from fat, 35-60% energy from carbohydrates, less than 200mg  of dietary cholesterol, 20-35 gm of total fiber daily;Understanding of distribution of calorie intake throughout the day with the consumption of 4-5 meals/snacks    Hypertension  Yes    Intervention  Provide education on lifestyle modifcations including regular physical activity/exercise, weight management, moderate sodium restriction and increased  consumption of fresh fruit, vegetables, and low fat dairy, alcohol moderation, and smoking cessation.;Monitor prescription use compliance.    Expected Outcomes  Short Term: Continued assessment and intervention until BP is < 140/18mm HG in hypertensive participants. < 130/62mm HG in hypertensive participants with diabetes, heart failure or chronic kidney disease.;Long Term: Maintenance of blood pressure at goal levels.    Lipids  Yes    Intervention  Provide education and support for participant on nutrition & aerobic/resistive exercise along with prescribed medications to achieve LDL 70mg , HDL >40mg .    Expected Outcomes  Short Term: Participant states understanding of desired cholesterol values and is compliant with medications prescribed. Participant is following exercise prescription and nutrition guidelines.;Long Term: Cholesterol controlled with medications as prescribed, with individualized exercise RX and with personalized nutrition plan. Value goals: LDL < 70mg , HDL > 40 mg.           Core Components/Risk Factors/Patient  Goals Review:    Core Components/Risk Factors/Patient Goals at Discharge (Final Review):    ITP Comments: ITP Comments    Row Name 08/05/18 0742   ITP Comments  Medical Director- Dr. Fransico Him, MD      Comments: Patient attended orientation from 0740 to [phone]  to review rules and guidelines for program. Completed 6 minute walk test, Intitial ITP, and exercise prescription.  VSS. Telemetry-sinus rhythm.  Asymptomatic.  Andi Hence, RN, BSN Cardiac Pulmonary Rehab 08/05/18 10:36 AM

## 2018-08-11 ENCOUNTER — Encounter (HOSPITAL_COMMUNITY)
Admission: RE | Admit: 2018-08-11 | Discharge: 2018-08-11 | Disposition: A | Discharge status: Home / Self Care | Payer: Federal, State, Local not specified - PPO | Source: Ambulatory Visit | Attending: Cardiology | Admitting: Cardiology

## 2018-08-11 DIAGNOSIS — Z955 Presence of coronary angioplasty implant and graft: Secondary | ICD-10-CM | POA: Diagnosis not present

## 2018-08-11 DIAGNOSIS — I214 Non-ST elevation (NSTEMI) myocardial infarction: Secondary | ICD-10-CM | POA: Diagnosis not present

## 2018-08-13 ENCOUNTER — Other Ambulatory Visit: Payer: Self-pay

## 2018-08-13 ENCOUNTER — Encounter (HOSPITAL_COMMUNITY)
Admission: RE | Admit: 2018-08-13 | Discharge: 2018-08-13 | Disposition: A | Discharge status: Home / Self Care | Payer: Federal, State, Local not specified - PPO | Source: Ambulatory Visit | Attending: Cardiology | Admitting: Cardiology

## 2018-08-13 DIAGNOSIS — I214 Non-ST elevation (NSTEMI) myocardial infarction: Secondary | ICD-10-CM | POA: Diagnosis not present

## 2018-08-13 DIAGNOSIS — Z955 Presence of coronary angioplasty implant and graft: Secondary | ICD-10-CM | POA: Diagnosis not present

## 2018-08-15 ENCOUNTER — Other Ambulatory Visit: Payer: Self-pay

## 2018-08-15 ENCOUNTER — Encounter (HOSPITAL_COMMUNITY)
Admission: RE | Admit: 2018-08-15 | Discharge: 2018-08-15 | Disposition: A | Discharge status: Home / Self Care | Payer: Federal, State, Local not specified - PPO | Source: Ambulatory Visit | Attending: Cardiology | Admitting: Cardiology

## 2018-08-15 DIAGNOSIS — I214 Non-ST elevation (NSTEMI) myocardial infarction: Secondary | ICD-10-CM | POA: Diagnosis not present

## 2018-08-15 DIAGNOSIS — Z955 Presence of coronary angioplasty implant and graft: Secondary | ICD-10-CM | POA: Diagnosis not present

## 2018-08-18 ENCOUNTER — Encounter (HOSPITAL_COMMUNITY): Payer: Federal, State, Local not specified - PPO

## 2018-08-18 ENCOUNTER — Telehealth (HOSPITAL_COMMUNITY): Payer: Self-pay | Admitting: *Deleted

## 2018-08-18 NOTE — Telephone Encounter (Signed)
Pt called about closure of cardiac rehab for two weeks.  No answer. VM left.

## 2018-08-20 ENCOUNTER — Encounter (HOSPITAL_COMMUNITY): Payer: Federal, State, Local not specified - PPO

## 2018-08-22 ENCOUNTER — Encounter (HOSPITAL_COMMUNITY): Payer: Federal, State, Local not specified - PPO

## 2018-08-25 ENCOUNTER — Encounter (HOSPITAL_COMMUNITY): Payer: Federal, State, Local not specified - PPO

## 2018-08-26 ENCOUNTER — Telehealth (HOSPITAL_COMMUNITY): Payer: Self-pay

## 2018-08-26 NOTE — Telephone Encounter (Signed)
Called pt to conduct nutrition education and counseling on heart healthy eating. Pt was not available. Left message with contact information and requested pt call dietitian back.

## 2018-08-26 NOTE — Telephone Encounter (Signed)
Called pt for nutrition education and counseling on heart healthy diet. Pt was not available, left message on voicemail requesting pt call the dietitian back. Distributed contact information.

## 2018-08-27 ENCOUNTER — Telehealth (HOSPITAL_COMMUNITY): Payer: Self-pay | Admitting: *Deleted

## 2018-08-27 ENCOUNTER — Encounter (HOSPITAL_COMMUNITY): Payer: Federal, State, Local not specified - PPO

## 2018-08-27 NOTE — Telephone Encounter (Signed)
Pt called and updated about extended closure of cardiac rehab for four weeks d/t CoVID-19.  Tentative reopen date of 09/22/2018.  No answer. VM left for patient.

## 2018-08-27 NOTE — Progress Notes (Signed)
Cardiac Individual Treatment Plan  Patient Details  Name: William Hancock MRN: [id number] Date of Birth: Jun 13, 1965 Referring Provider:     CARDIAC REHAB PHASE II ORIENTATION from 08/05/2018 in [facility]  Referring Provider  Dr. Virgina Jock      Initial Encounter Date:    CARDIAC REHAB PHASE II ORIENTATION from 08/05/2018 in [facility]  Date  08/05/18      Visit Diagnosis: 06/05/18 NSTEMI  06/05/18 DES RCA, 06/09/18 DES LAD/Diag bifurcation  Patient's Home Medications on Admission:  Current Outpatient Medications:  .  amLODipine (NORVASC) 5 MG tablet, Take 1 tablet (5 mg total) by mouth daily., Disp: 30 tablet, Rfl: 0 .  aspirin 81 MG chewable tablet, Chew 1 tablet (81 mg total) by mouth daily., Disp: 30 tablet, Rfl: 0 .  atorvastatin (LIPITOR) 80 MG tablet, Take 1 tablet (80 mg total) by mouth daily at 6 PM., Disp: 90 tablet, Rfl: 3 .  metoprolol succinate (TOPROL-XL) 50 MG 24 hr tablet, Take 1 tablet (50 mg total) by mouth daily. Take with or immediately following a meal., Disp: 30 tablet, Rfl: 0 .  nitroGLYCERIN (NITROSTAT) 0.4 MG SL tablet, Place 1 tablet (0.4 mg total) under the tongue every 5 (five) minutes x 3 doses as needed for chest pain., Disp: 30 tablet, Rfl: 0 .  ticagrelor (BRILINTA) 90 MG TABS tablet, Take 1 tablet (90 mg total) by mouth 2 (two) times daily., Disp: 60 tablet, Rfl: 0  Past Medical History: Past Medical History:  Diagnosis Date  . Coronary artery disease   . Hyperlipidemia   . Hypertension   . Myocardial infarction ([place])     Tobacco Use: Social History   Tobacco Use  Smoking Status Former Smoker  . Last attempt to quit: 06/05/1998  . Years since quitting: 20.2  Smokeless Tobacco Never Used    Labs: Recent Review Scientist, physiological    Labs for ITP Cardiac and Pulmonary Rehab Latest Ref Rng & Units 06/06/2018 06/09/2018   Cholestrol 0 - 200 mg/dL 235(H) -   LDLCALC 0 - 99 mg/dL 158(H) -   HDL  >40 mg/dL 50 -   Trlycerides <150 mg/dL 133 -   Hemoglobin A1c 4.8 - 5.6 % 5.1 -   TCO2 22 - 32 mmol/L - 24      Capillary Blood Glucose: No results found for: GLUCAP   Exercise Target Goals: Exercise Program Goal: Individual exercise prescription set using results from initial 6 min walk test and THRR while considering  patient's activity barriers and safety.   Exercise Prescription Goal: Initial exercise prescription builds to 30-45 minutes a day of aerobic activity, 2-3 days per week.  Home exercise guidelines will be given to patient during program as part of exercise prescription that the participant will acknowledge.  Activity Barriers & Risk Stratification: Activity Barriers & Cardiac Risk Stratification - 08/05/18 0856      Activity Barriers & Cardiac Risk Stratification   Activity Barriers  None    Cardiac Risk Stratification  High       6 Minute Walk: 6 Minute Walk    Row Name 08/05/18 0743         6 Minute Walk   Phase  Initial     Distance  1851 feet     Walk Time  6 minutes     # of Rest Breaks  0     MPH  3.51     METS  4.31  RPE  11     Perceived Dyspnea   0     VO2 Peak  15.07     Symptoms  No     Resting HR  79 bpm     Resting BP  120/70     Resting Oxygen Saturation   98 %     Exercise Oxygen Saturation  during 6 min walk  98 %     Max Ex. HR  80 bpm     Max Ex. BP  128/64     2 Minute Post BP  130/80        Oxygen Initial Assessment:   Oxygen Re-Evaluation:   Oxygen Discharge (Final Oxygen Re-Evaluation):   Initial Exercise Prescription: Initial Exercise Prescription - 08/05/18 0900      Date of Initial Exercise RX and Referring Provider   Date  08/05/18    Referring Provider  Dr. Virgina Jock    Expected Discharge Date  11/10/18      Treadmill   MPH  4    Grade  0    Minutes  10      Bike   Level  1.6    Minutes  10    METs  4.02      Rower   Level  3    Watts  60    Minutes  10      Prescription Details    Frequency (times per week)  3    Duration  Progress to 30 minutes of continuous aerobic without signs/symptoms of physical distress      Intensity   THRR 40-80% of Max Heartrate  66-132    Ratings of Perceived Exertion  11-13      Progression   Progression  Continue to progress workloads to maintain intensity without signs/symptoms of physical distress.      Resistance Training   Training Prescription  Yes    Weight  6 lbs.     Reps  10-15       Perform Capillary Blood Glucose checks as needed.  Exercise Prescription Changes: Exercise Prescription Changes    Row Name 08/11/18 0900 08/15/18 1409           Response to Exercise   Blood Pressure (Admit)  100/78  122/78      Blood Pressure (Exercise)  120/70  130/70      Blood Pressure (Exit)  102/60  104/60      Heart Rate (Admit)  73 bpm  78 bpm      Heart Rate (Exercise)  105 bpm  110 bpm      Heart Rate (Exit)  71 bpm  78 bpm      Rating of Perceived Exertion (Exercise)  12  12      Perceived Dyspnea (Exercise)  0  0      Symptoms  None  None      Comments  Pt oriented to exercise equipment  None      Duration  Progress to 30 minutes of  aerobic without signs/symptoms of physical distress  Progress to 30 minutes of  aerobic without signs/symptoms of physical distress      Intensity  THRR unchanged  THRR unchanged        Progression   Progression  Continue to progress workloads to maintain intensity without signs/symptoms of physical distress.  Continue to progress workloads to maintain intensity without signs/symptoms of physical distress.      Average METs  4.53  4.88  Resistance Training   Training Prescription  Yes  No      Weight  7lbs  -      Reps  10-15  -      Time  10 Minutes  -        Treadmill   MPH  4  3.8      Grade  0  0      Minutes  10  15      METs  4.06  4.06        Bike   Level  1.6  -      Minutes  10  -      METs  4.02  -        Rower   Level  4  4      Watts  55  60       Minutes  10  15      METs  5.5  5.7         Exercise Comments: Exercise Comments    Row Name 08/11/18 0931           Exercise Comments  Pt's first day of rehab. Pt responded well to exercise workloads. Talked with pt regarding goals of program. Will continue to monitor and progress pt as tolerated.           Exercise Goals and Review: Exercise Goals    Row Name 08/05/18 0844             Exercise Goals   Increase Physical Activity  Yes       Intervention  Provide advice, education, support and counseling about physical activity/exercise needs.;Develop an individualized exercise prescription for aerobic and resistive training based on initial evaluation findings, risk stratification, comorbidities and participant's personal goals.       Expected Outcomes  Short Term: Attend rehab on a regular basis to increase amount of physical activity.;Long Term: Add in home exercise to make exercise part of routine and to increase amount of physical activity.;Long Term: Exercising regularly at least 3-5 days a week.       Increase Strength and Stamina  Yes       Intervention  Provide advice, education, support and counseling about physical activity/exercise needs.;Develop an individualized exercise prescription for aerobic and resistive training based on initial evaluation findings, risk stratification, comorbidities and participant's personal goals.       Expected Outcomes  Short Term: Increase workloads from initial exercise prescription for resistance, speed, and METs.;Long Term: Improve cardiorespiratory fitness, muscular endurance and strength as measured by increased METs and functional capacity (6MWT)       Able to understand and use rate of perceived exertion (RPE) scale  Yes       Intervention  Provide education and explanation on how to use RPE scale       Expected Outcomes  Short Term: Able to use RPE daily in rehab to express subjective intensity level;Long Term:  Able to use RPE to  guide intensity level when exercising independently       Knowledge and understanding of Target Heart Rate Range (THRR)  Yes       Intervention  Provide education and explanation of THRR including how the numbers were predicted and where they are located for reference       Expected Outcomes  Short Term: Able to state/look up THRR;Long Term: Able to use THRR to govern intensity when exercising independently;Short Term: Able to use daily as  guideline for intensity in rehab       Able to check pulse independently  Yes       Intervention  Provide education and demonstration on how to check pulse in carotid and radial arteries.;Review the importance of being able to check your own pulse for safety during independent exercise       Expected Outcomes  Short Term: Able to explain why pulse checking is important during independent exercise;Long Term: Able to check pulse independently and accurately       Understanding of Exercise Prescription  Yes       Intervention  Provide education, explanation, and written materials on patient's individual exercise prescription       Expected Outcomes  Short Term: Able to explain program exercise prescription;Long Term: Able to explain home exercise prescription to exercise independently          Exercise Goals Re-Evaluation :   Discharge Exercise Prescription (Final Exercise Prescription Changes): Exercise Prescription Changes - 08/15/18 1409      Response to Exercise   Blood Pressure (Admit)  122/78    Blood Pressure (Exercise)  130/70    Blood Pressure (Exit)  104/60    Heart Rate (Admit)  78 bpm    Heart Rate (Exercise)  110 bpm    Heart Rate (Exit)  78 bpm    Rating of Perceived Exertion (Exercise)  12    Perceived Dyspnea (Exercise)  0    Symptoms  None    Comments  None    Duration  Progress to 30 minutes of  aerobic without signs/symptoms of physical distress    Intensity  THRR unchanged      Progression   Progression  Continue to progress  workloads to maintain intensity without signs/symptoms of physical distress.    Average METs  4.88      Resistance Training   Training Prescription  No    Weight  --    Reps  --    Time  --      Treadmill   MPH  3.8    Grade  0    Minutes  15    METs  4.06      Bike   Level  --    Minutes  --    METs  --      Rower   Level  4    Watts  60    Minutes  15    METs  5.7       Nutrition:  Target Goals: Understanding of nutrition guidelines, daily intake of sodium 1500mg , cholesterol 200mg , calories 30% from fat and 7% or less from saturated fats, daily to have 5 or more servings of fruits and vegetables.  Biometrics: Pre Biometrics - 08/05/18 0856      Pre Biometrics   Height  5\' 11"  (1.803 m)    Weight  100.7 kg    Waist Circumference  42 inches    Hip Circumference  42 inches    Waist to Hip Ratio  1 %    BMI (Calculated)  30.98    Triceps Skinfold  26 mm    % Body Fat  31.2 %    Grip Strength  44 kg    Flexibility  10.5 in    Single Leg Stand  17.93 seconds        Nutrition Therapy Plan and Nutrition Goals: Nutrition Therapy & Goals - 08/18/18 1427      Nutrition Therapy  Diet  heart healthy      Personal Nutrition Goals   Nutrition Goal  Pt to build a healthy plate, including non-starchy vegetables, fruit, lean protein, low fat dairy and whole grains    Personal Goal #2  pt to weigh and measure portions for accuracy       Intervention Plan   Intervention  Prescribe, educate and counsel regarding individualized specific dietary modifications aiming towards targeted core components such as weight, hypertension, lipid management, diabetes, heart failure and other comorbidities.    Expected Outcomes  Short Term Goal: Understand basic principles of dietary content, such as calories, fat, sodium, cholesterol and nutrients.;Long Term Goal: Adherence to prescribed nutrition plan.       Nutrition Assessments: Nutrition Assessments - 08/18/18 1428       MEDFICTS Scores   Pre Score  53       Nutrition Goals Re-Evaluation:   Nutrition Goals Re-Evaluation:   Nutrition Goals Discharge (Final Nutrition Goals Re-Evaluation):   Psychosocial: Target Goals: Acknowledge presence or absence of significant depression and/or stress, maximize coping skills, provide positive support system. Participant is able to verbalize types and ability to use techniques and skills needed for reducing stress and depression.  Initial Review & Psychosocial Screening: Initial Psych Review & Screening - 08/05/18 0941      Initial Review   Current issues with  None Identified      Family Dynamics   Good Support System?  Yes   wife, children, family and friends      Barriers   Psychosocial barriers to participate in program  There are no identifiable barriers or psychosocial needs.      Screening Interventions   Interventions  Encouraged to exercise       Quality of Life Scores: Quality of Life - 08/05/18 0837      Quality of Life   Select  Quality of Life      Quality of Life Scores   Health/Function Pre  25.6 %    Socioeconomic Pre  28.29 %    Psych/Spiritual Pre  29.14 %    Family Pre  28.8 %    GLOBAL Pre  27.35 %      Scores of 19 and below usually indicate a poorer quality of life in these areas.  A difference of  2-3 points is a clinically meaningful difference.  A difference of 2-3 points in the total score of the Quality of Life Index has been associated with significant improvement in overall quality of life, self-image, physical symptoms, and general health in studies assessing change in quality of life.  PHQ-9: Recent Review Flowsheet Data    There is no flowsheet data to display.     Interpretation of Total Score  Total Score Depression Severity:  1-4 = Minimal depression, 5-9 = Mild depression, 10-14 = Moderate depression, 15-19 = Moderately severe depression, 20-27 = Severe depression   Psychosocial Evaluation and  Intervention: Psychosocial Evaluation - 08/20/18 1402      Psychosocial Evaluation & Interventions   Interventions  Encouraged to exercise with the program and follow exercise prescription    Comments  No psychosocial interventions necessary. Amery enjoys playing golf.     Expected Outcomes  Alie will continue to exhibit a positive outlook with good coping skills.     Continue Psychosocial Services   No Follow up required       Psychosocial Re-Evaluation: Psychosocial Re-Evaluation    [place] Name 08/20/18 1410  Psychosocial Re-Evaluation   Current issues with  None Identified       Comments  No psychosocial interventions.        Expected Outcomes  Teague will maintain a positive outlook with good coping skills.       Interventions  Encouraged to attend Cardiac Rehabilitation for the exercise       Continue Psychosocial Services   No Follow up required          Psychosocial Discharge (Final Psychosocial Re-Evaluation): Psychosocial Re-Evaluation - 08/20/18 1410      Psychosocial Re-Evaluation   Current issues with  None Identified    Comments  No psychosocial interventions.     Expected Outcomes  Garnett will maintain a positive outlook with good coping skills.    Interventions  Encouraged to attend Cardiac Rehabilitation for the exercise    Continue Psychosocial Services   No Follow up required       Vocational Rehabilitation: Provide vocational rehab assistance to qualifying candidates.   Vocational Rehab Evaluation & Intervention: Vocational Rehab - 08/05/18 0943      Initial Vocational Rehab Evaluation & Intervention   Assessment shows need for Vocational Rehabilitation  No   [employer] [place] [employer]      Education: Education Goals: Education classes will be provided on a weekly basis, covering required topics. Participant will state understanding/return demonstration of topics presented.  Learning Barriers/Preferences: Learning Barriers/Preferences -  08/05/18 0857      Learning Barriers/Preferences   Learning Barriers  Sight    Learning Preferences  Skilled Demonstration       Education Topics: Count Your Pulse:  -Group instruction provided by verbal instruction, demonstration, patient participation and written materials to support subject.  Instructors address importance of being able to find your pulse and how to count your pulse when at home without a heart monitor.  Patients get hands on experience counting their pulse with staff help and individually.   Heart Attack, Angina, and Risk Factor Modification:  -Group instruction provided by verbal instruction, video, and written materials to support subject.  Instructors address signs and symptoms of angina and heart attacks.    Also discuss risk factors for heart disease and how to make changes to improve heart health risk factors.   Functional Fitness:  -Group instruction provided by verbal instruction, demonstration, patient participation, and written materials to support subject.  Instructors address safety measures for doing things around the house.  Discuss how to get up and down off the floor, how to pick things up properly, how to safely get out of a chair without assistance, and balance training.   Meditation and Mindfulness:  -Group instruction provided by verbal instruction, patient participation, and written materials to support subject.  Instructor addresses importance of mindfulness and meditation practice to help reduce stress and improve awareness.  Instructor also leads participants through a meditation exercise.    Stretching for Flexibility and Mobility:  -Group instruction provided by verbal instruction, patient participation, and written materials to support subject.  Instructors lead participants through series of stretches that are designed to increase flexibility thus improving mobility.  These stretches are additional exercise for major muscle groups that are  typically performed during regular warm up and cool down.   Hands Only CPR:  -Group verbal, video, and participation provides a basic overview of AHA guidelines for community CPR. Role-play of emergencies allow participants the opportunity to practice calling for help and chest compression technique with discussion of AED use.   Hypertension: -  Group verbal and written instruction that provides a basic overview of hypertension including the most recent diagnostic guidelines, risk factor reduction with self-care instructions and medication management.    Nutrition I class: Heart Healthy Eating:  -Group instruction provided by PowerPoint slides, verbal discussion, and written materials to support subject matter. The instructor gives an explanation and review of the Therapeutic Lifestyle Changes diet recommendations, which includes a discussion on lipid goals, dietary fat, sodium, fiber, plant stanol/sterol esters, sugar, and the components of a well-balanced, healthy diet.   Nutrition II class: Lifestyle Skills:  -Group instruction provided by PowerPoint slides, verbal discussion, and written materials to support subject matter. The instructor gives an explanation and review of label reading, grocery shopping for heart health, heart healthy recipe modifications, and ways to make healthier choices when eating out.   Diabetes Question & Answer:  -Group instruction provided by PowerPoint slides, verbal discussion, and written materials to support subject matter. The instructor gives an explanation and review of diabetes co-morbidities, pre- and post-prandial blood glucose goals, pre-exercise blood glucose goals, signs, symptoms, and treatment of hypoglycemia and hyperglycemia, and foot care basics.   Diabetes Blitz:  -Group instruction provided by PowerPoint slides, verbal discussion, and written materials to support subject matter. The instructor gives an explanation and review of the physiology  behind type 1 and type 2 diabetes, diabetes medications and rational behind using different medications, pre- and post-prandial blood glucose recommendations and Hemoglobin A1c goals, diabetes diet, and exercise including blood glucose guidelines for exercising safely.    Portion Distortion:  -Group instruction provided by PowerPoint slides, verbal discussion, written materials, and food models to support subject matter. The instructor gives an explanation of serving size versus portion size, changes in portions sizes over the last 20 years, and what consists of a serving from each food group.   Stress Management:  -Group instruction provided by verbal instruction, video, and written materials to support subject matter.  Instructors review role of stress in heart disease and how to cope with stress positively.     Exercising on Your Own:  -Group instruction provided by verbal instruction, power point, and written materials to support subject.  Instructors discuss benefits of exercise, components of exercise, frequency and intensity of exercise, and end points for exercise.  Also discuss use of nitroglycerin and activating EMS.  Review options of places to exercise outside of rehab.  Review guidelines for sex with heart disease.   Cardiac Drugs I:  -Group instruction provided by verbal instruction and written materials to support subject.  Instructor reviews cardiac drug classes: antiplatelets, anticoagulants, beta blockers, and statins.  Instructor discusses reasons, side effects, and lifestyle considerations for each drug class.   Cardiac Drugs II:  -Group instruction provided by verbal instruction and written materials to support subject.  Instructor reviews cardiac drug classes: angiotensin converting enzyme inhibitors (ACE-I), angiotensin II receptor blockers (ARBs), nitrates, and calcium channel blockers.  Instructor discusses reasons, side effects, and lifestyle considerations for each drug  class.   Anatomy and Physiology of the Circulatory System:  Group verbal and written instruction and models provide basic cardiac anatomy and physiology, with the coronary electrical and arterial systems. Review of: AMI, Angina, Valve disease, Heart Failure, Peripheral Artery Disease, Cardiac Arrhythmia, Pacemakers, and the ICD.   Other Education:  -Group or individual verbal, written, or video instructions that support the educational goals of the cardiac rehab program.   Holiday Eating Survival Tips:  -Group instruction provided by PowerPoint slides, verbal discussion, and  written materials to support subject matter. The instructor gives patients tips, tricks, and techniques to help them not only survive but enjoy the holidays despite the onslaught of food that accompanies the holidays.   Knowledge Questionnaire Score: Knowledge Questionnaire Score - 08/05/18 0837      Knowledge Questionnaire Score   Pre Score  22/24       Core Components/Risk Factors/Patient Goals at Admission: Personal Goals and Risk Factors at Admission - 08/05/18 0843      Core Components/Risk Factors/Patient Goals on Admission    Weight Management  Yes;Obesity    Intervention  Weight Management: Develop a combined nutrition and exercise program designed to reach desired caloric intake, while maintaining appropriate intake of nutrient and fiber, sodium and fats, and appropriate energy expenditure required for the weight goal.;Weight Management: Provide education and appropriate resources to help participant work on and attain dietary goals.;Weight Management/Obesity: Establish reasonable short term and long term weight goals.;Obesity: Provide education and appropriate resources to help participant work on and attain dietary goals.    Admit Weight  222 lb 0.1 oz (100.7 kg)    Expected Outcomes  Short Term: Continue to assess and modify interventions until short term weight is achieved;Long Term: Adherence to  nutrition and physical activity/exercise program aimed toward attainment of established weight goal;Weight Loss: Understanding of general recommendations for a balanced deficit meal plan, which promotes 1-2 lb weight loss per week and includes a negative energy balance of [phone] kcal/d;Understanding recommendations for meals to include 15-35% energy as protein, 25-35% energy from fat, 35-60% energy from carbohydrates, less than 200mg  of dietary cholesterol, 20-35 gm of total fiber daily;Understanding of distribution of calorie intake throughout the day with the consumption of 4-5 meals/snacks    Hypertension  Yes    Intervention  Provide education on lifestyle modifcations including regular physical activity/exercise, weight management, moderate sodium restriction and increased consumption of fresh fruit, vegetables, and low fat dairy, alcohol moderation, and smoking cessation.;Monitor prescription use compliance.    Expected Outcomes  Short Term: Continued assessment and intervention until BP is < 140/59mm HG in hypertensive participants. < 130/56mm HG in hypertensive participants with diabetes, heart failure or chronic kidney disease.;Long Term: Maintenance of blood pressure at goal levels.    Lipids  Yes    Intervention  Provide education and support for participant on nutrition & aerobic/resistive exercise along with prescribed medications to achieve LDL 70mg , HDL >40mg .    Expected Outcomes  Short Term: Participant states understanding of desired cholesterol values and is compliant with medications prescribed. Participant is following exercise prescription and nutrition guidelines.;Long Term: Cholesterol controlled with medications as prescribed, with individualized exercise RX and with personalized nutrition plan. Value goals: LDL < 70mg , HDL > 40 mg.       Core Components/Risk Factors/Patient Goals Review:  Goals and Risk Factor Review    Row Name 08/20/18 1403             Core  Components/Risk Factors/Patient Goals Review   Personal Goals Review  Weight Management/Obesity;Lipids;Hypertension       Review  Pt with multiple CAD RFs willing to participate in CR. Pt would like to get back to weights and live beyond 80 years. Pt continues to tolerate exercise well with his recent start to CR.  Exercise currently on hold as department is closed per recommended guidelines from federal government to prevent spread of COVID-19.        Expected Outcomes  Pt will continue to participate in CR exercise,  nutrition, and lifestyle modification opportunities.           Core Components/Risk Factors/Patient Goals at Discharge (Final Review):  Goals and Risk Factor Review - 08/20/18 1403      Core Components/Risk Factors/Patient Goals Review   Personal Goals Review  Weight Management/Obesity;Lipids;Hypertension    Review  Pt with multiple CAD RFs willing to participate in CR. Pt would like to get back to weights and live beyond 80 years. Pt continues to tolerate exercise well with his recent start to CR.  Exercise currently on hold as department is closed per recommended guidelines from federal government to prevent spread of COVID-19.     Expected Outcomes  Pt will continue to participate in CR exercise, nutrition, and lifestyle modification opportunities.        ITP Comments: ITP Comments    Row Name 08/05/18 0742 08/20/18 1400         ITP Comments  Medical Director- Dr. Fransico Him, MD  30 Day ITP Review. Pt continues to tolerate exercise well.  Exercise currently on hold as department is closed per recommended guidelines from federal government to prevent spread of COVID-19.          Comments: See ITP Comments.

## 2018-08-29 ENCOUNTER — Encounter (HOSPITAL_COMMUNITY): Payer: Federal, State, Local not specified - PPO

## 2018-09-01 ENCOUNTER — Other Ambulatory Visit (HOSPITAL_COMMUNITY): Payer: Self-pay | Admitting: Cardiology

## 2018-09-01 ENCOUNTER — Encounter (HOSPITAL_COMMUNITY): Payer: Federal, State, Local not specified - PPO

## 2018-09-01 DIAGNOSIS — E782 Mixed hyperlipidemia: Secondary | ICD-10-CM | POA: Diagnosis not present

## 2018-09-01 DIAGNOSIS — I2510 Atherosclerotic heart disease of native coronary artery without angina pectoris: Secondary | ICD-10-CM | POA: Diagnosis not present

## 2018-09-02 LAB — COMPREHENSIVE METABOLIC PANEL
ALT: 87 IU/L — AB (ref 0–44)
AST: 96 IU/L — ABNORMAL HIGH (ref 0–40)
Albumin/Globulin Ratio: 1.7 (ref 1.2–2.2)
Albumin: 4.7 g/dL (ref 3.8–4.9)
Alkaline Phosphatase: 83 IU/L (ref 39–117)
BUN/Creatinine Ratio: 10 (ref 9–20)
BUN: 17 mg/dL (ref 6–24)
Bilirubin Total: 0.9 mg/dL (ref 0.0–1.2)
CO2: 23 mmol/L (ref 20–29)
Calcium: 9.4 mg/dL (ref 8.7–10.2)
Chloride: 105 mmol/L (ref 96–106)
Creatinine, Ser: 1.7 mg/dL — ABNORMAL HIGH (ref 0.76–1.27)
GFR calc Af Amer: 51 mL/min/{1.73_m2} — ABNORMAL LOW (ref >59)
GFR calc non Af Amer: 44 mL/min/{1.73_m2} — ABNORMAL LOW (ref >59)
Globulin, Total: 2.8 g/dL (ref 1.5–4.5)
Glucose: 80 mg/dL (ref 65–99)
Potassium: 5 mmol/L (ref 3.5–5.2)
Sodium: 143 mmol/L (ref 134–144)
Total Protein: 7.5 g/dL (ref 6.0–8.5)

## 2018-09-02 LAB — LIPID PANEL W/O CHOL/HDL RATIO
Cholesterol, Total: 92 mg/dL — ABNORMAL LOW (ref 100–199)
HDL: 42 mg/dL (ref >39)
LDL Calculated: 37 mg/dL (ref 0–99)
TRIGLYCERIDES: 66 mg/dL (ref 0–149)
VLDL Cholesterol Cal: 13 mg/dL (ref 5–40)

## 2018-09-03 ENCOUNTER — Encounter (HOSPITAL_COMMUNITY): Payer: Federal, State, Local not specified - PPO

## 2018-09-05 ENCOUNTER — Encounter (HOSPITAL_COMMUNITY): Payer: Federal, State, Local not specified - PPO

## 2018-09-07 NOTE — Progress Notes (Signed)
Virtual Visit via Video Note   Subjective:   William Hancock, male    DOB: 14-Nov-1965, 53 y.o.   MRN: [id number]   I connected with the patient on 09/08/18 by a video enabled telemedicine application and verified that I am speaking with the correct person using two identifiers.     I discussed the limitations of evaluation and management by telemedicine and the availability of in person appointments. The patient expressed understanding and agreed to proceed.   This visit type was conducted due to national recommendations for restrictions regarding the COVID-19 Pandemic (e.g. social distancing).  This format is felt to be most appropriate for this patient at this time.  All issues noted in this document were discussed and addressed.  No physical exam was performed (except for noted visual exam findings with Tele health visits).  The patient has consented to conduct a Tele health visit and understands insurance will be billed.     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  This is  his 3 month follow visit. Since his last visit in January 2020, he has been doing well with cardiac rehabilitation therapy with increasing exercise tolerance.  Unfortunately, currently his cardiac rehabilitation is on hold due to [place]. he recently underwent CMP and lipid panel testing.  His lipid panel is remarkably improved, with nearly 70% reduction in LDL. However, AST and ALT is now increased, still less than 100.  While he does not have any angina symptoms, he reports feeling tired and fatigued. He wonders if this is due to metoprolol. On a separate note, he endorses snoring every night. He also endorses daytime sleepiness.  Past Medical History:  Diagnosis Date   . Coronary artery disease   . Hyperlipidemia   . Hypertension   . Myocardial infarction [facility])      Past Surgical History:  Procedure Laterality Date  . CARDIAC CATHETERIZATION  06/09/2018  . CORONARY ATHERECTOMY N/A 06/09/2018   Procedure: CORONARY ATHERECTOMY;  Surgeon: Nigel Mormon, MD;  Location: [place] [facility];  Service: Cardiovascular;  Laterality: N/A;  . CORONARY STENT INTERVENTION N/A 06/06/2018   Procedure: CORONARY STENT INTERVENTION;  Surgeon: Nigel Mormon, MD;  Location: [place] [facility];  Service: Cardiovascular;  Laterality: N/A;  . CORONARY STENT INTERVENTION  06/09/2018  . CORONARY STENT INTERVENTION N/A 06/09/2018   Procedure: CORONARY STENT INTERVENTION;  Surgeon: Nigel Mormon, MD;  Location: [place] [facility];  Service: Cardiovascular;  Laterality: N/A;  . INTRAVASCULAR ULTRASOUND/IVUS N/A 06/06/2018   Procedure: Intravascular Ultrasound/IVUS;  Surgeon: Nigel Mormon, MD;  Location: [place] [facility];  Service: Cardiovascular;  Laterality: N/A;  . INTRAVASCULAR ULTRASOUND/IVUS N/A 06/09/2018   Procedure: Intravascular Ultrasound/IVUS;  Surgeon: Nigel Mormon, MD;  Location: [place] [facility];  Service: Cardiovascular;  Laterality: N/A;  . KNEE SURGERY Left 2009  . LEFT HEART CATH AND CORONARY ANGIOGRAPHY N/A 06/06/2018   Procedure: LEFT HEART CATH AND CORONARY ANGIOGRAPHY;  Surgeon: Nigel Mormon, MD;  Location: [place] [facility];  Service: Cardiovascular;  Laterality: N/A;  . TONSILLECTOMY    . WISDOM TOOTH EXTRACTION       Social History   Socioeconomic History  . Marital status: Married    Spouse name: Not on file  . Number of  children: Not on file  . Years of education: Not on file  . Highest education level: Not on file  Occupational History  . Occupation: Curator  Social Needs  . Financial resource strain: Not on file  . Food insecurity:    Worry: Not on file    Inability: Not on file  .  Transportation needs:    Medical: Not on file    Non-medical: Not on file  Tobacco Use  . Smoking status: Former Smoker    Last attempt to quit: 06/05/1998    Years since quitting: 20.2  . Smokeless tobacco: Never Used  Substance and Sexual Activity  . Alcohol use: Yes    Comment: 2 drinks/month  . Drug use: Never  . Sexual activity: Not on file  Lifestyle  . Physical activity:    Days per week: Not on file    Minutes per session: Not on file  . Stress: Not on file  Relationships  . Social connections:    Talks on phone: Not on file    Gets together: Not on file    Attends religious service: Not on file    Active member of club or organization: Not on file    Attends meetings of clubs or organizations: Not on file    Relationship status: Not on file  . Intimate partner violence:    Fear of current or ex partner: Not on file    Emotionally abused: Not on file    Physically abused: Not on file    Forced sexual activity: Not on file  Other Topics Concern  . Not on file  Social History Narrative  . Not on file     Current Outpatient Medications on File Prior to Visit  Medication Sig Dispense Refill  . amLODipine (NORVASC) 5 MG tablet Take 1 tablet (5 mg total) by mouth daily. 30 tablet 0  . aspirin 81 MG chewable tablet Chew 1 tablet (81 mg total) by mouth daily. 30 tablet 0  . metoprolol succinate (TOPROL-XL) 50 MG 24 hr tablet Take 1 tablet (50 mg total) by mouth daily. Take with or immediately following a meal. 30 tablet 0  . nitroGLYCERIN (NITROSTAT) 0.4 MG SL tablet Place 1 tablet (0.4 mg total) under the tongue every 5 (five) minutes x 3 doses as needed for chest pain. 30 tablet 0  . ticagrelor (BRILINTA) 90 MG TABS tablet Take 1 tablet (90 mg total) by mouth 2 (two) times daily. 60 tablet 0  . vitamin B-12 (CYANOCOBALAMIN) 500 MCG tablet Take 1,000 mcg by mouth daily.     No current facility-administered medications on file prior to visit.     Cardiovascular  studies:  Cath 06/09/2018: LM: Normal  LAD: Prox to mid LAD/Diag1 bifurcation severe calcific 80% stenosis        Diag 1 mid 75% stenosis        Atherectomy and prox LAD into Diag         Synergy DES 2.75 X 16 mm DES        Overlapping Stent into prox LAD Synergy DES 3.0 X 38 mm        Provisional stent mid LAD with minicrush technique Synergy DES 2.75 X 16 mm DES Ramus; 50 % proximal disease LCx: Mild luminal irregularities RCA: Stenting performed 06/06/2018        Severe mid 99% stenosis--->Orsero 3.5 X 26 mm Orsero DES         Post dilatation with 4.0 mm  [place] balloon        Severe prox 80% stenosis--->Orsero 3.5 X 9 mm DES        Post dilatation with 4.0 mm [place] balloon   Recommendation: DAPT with Aspirin and brilinta for 1 year Aggressive risk factor modification  Hospital echocardiogram 06/06/2018: - Left ventricle: The cavity size was normal. There was mild   concentric hypertrophy. Systolic function was normal. The   estimated ejection fraction was in the range of 55% to 60%. Wall   motion was normal; there were no regional wall motion   abnormalities. Doppler parameters are consistent with abnormal   left ventricular relaxation (grade 1 diastolic dysfunction). - No significant valvular abnormality.  Recent labs: Results for HYUN, MARSALIS (MRN [id number]) as of 09/07/2018 16:56  Ref. Range 09/01/2018 08:10  COMPREHENSIVE METABOLIC PANEL Unknown Rpt (A)  Sodium Latest Ref Range: 134 - 144 mmol/L 143  Potassium Latest Ref Range: 3.5 - 5.2 mmol/L 5.0  Chloride Latest Ref Range: 96 - 106 mmol/L 105  CO2 Latest Ref Range: 20 - 29 mmol/L 23  Glucose Latest Ref Range: 65 - 99 mg/dL 80  BUN Latest Ref Range: 6 - 24 mg/dL 17  Creatinine Latest Ref Range: 0.76 - 1.27 mg/dL 1.70 (H)  Calcium Latest Ref Range: 8.7 - 10.2 mg/dL 9.4  BUN/Creatinine Ratio Latest Ref Range: 9 - 20  10  Alkaline Phosphatase Latest Ref Range: 39 - 117 IU/L 83  Albumin Latest Ref Range: 3.8 - 4.9 g/dL 4.7   Albumin/Globulin Ratio Latest Ref Range: 1.2 - 2.2  1.7  AST Latest Ref Range: 0 - 40 IU/L 96 (H)  ALT Latest Ref Range: 0 - 44 IU/L 87 (H)  Total Protein Latest Ref Range: 6.0 - 8.5 g/dL 7.5  Total Bilirubin Latest Ref Range: 0.0 - 1.2 mg/dL 0.9  GFR, Est Non African American Latest Ref Range: >59 mL/min/1.73 44 (L)  GFR, Est African American Latest Ref Range: >59 mL/min/1.73 51 (L)   Results for RUSHAWN, CAPSHAW (MRN [id number]) as of 09/07/2018 16:56  Ref. Range 09/01/2018 08:10  Cholesterol, Total Latest Ref Range: 100 - 199 mg/dL 92 (L)  HDL Cholesterol Latest Ref Range: >39 mg/dL 42  LDL (calc) Latest Ref Range: 0 - 99 mg/dL 37  Triglycerides Latest Ref Range: 0 - 149 mg/dL 66  VLDL Cholesterol Cal Latest Ref Range: 5 - 40 mg/dL 13   Results for JADARIUS, COMMONS (MRN [id number]) as of 09/07/2018 16:56  Ref. Range 06/06/2018 02:22  Total CHOL/HDL Ratio Latest Units: RATIO 4.7  Cholesterol Latest Ref Range: 0 - 200 mg/dL 235 (H)  HDL Cholesterol Latest Ref Range: >40 mg/dL 50  LDL (calc) Latest Ref Range: 0 - 99 mg/dL 158 (H)  Triglycerides Latest Ref Range: <150 mg/dL 133  VLDL Latest Ref Range: 0 - 40 mg/dL 27    06/10/2018: Glucose 100. BUN/Cr 10/1.46. eGFR 54. Na/K 136/3.9 H/H 14/40. MCV 90. Platelets 187.   Review of Systems  Constitution: Negative for decreased appetite, malaise/fatigue, weight gain and weight loss.  HENT: Negative for congestion.   Eyes: Negative for visual disturbance.  Cardiovascular: Negative for chest pain, claudication, dyspnea on exertion, leg swelling, palpitations and syncope.  Respiratory: Negative for shortness of breath.   Endocrine: Negative for cold intolerance.  Hematologic/Lymphatic: Does not bruise/bleed easily.  Skin: Negative for itching and rash.  Musculoskeletal: Negative for myalgias.  Gastrointestinal: Negative for abdominal pain, nausea and vomiting.  Genitourinary: Negative for dysuria.  Neurological: Negative for dizziness and  weakness.  Psychiatric/Behavioral: The patient is not nervous/anxious.   All other systems reviewed and are negative.        Vitals:   09/08/18 0941  BP: 117/78  Pulse: 60   (Measured by the patient using a home BP monitor)   Observation/findings during video visit   Objective:   Physical Exam  Constitutional: He is oriented to person, place, and time. He appears well-developed and well-nourished. No distress.  Neck: No JVD present.  Pulmonary/Chest: Effort normal.  Neurological: He is alert and oriented to person, place, and time.  Psychiatric: He has a normal mood and affect.  Nursing note and vitals reviewed.         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 Given his fatigue symptoms, I has reduced metoprolol succinate to 25 mg daily.  I do suspect though, that OSA is the likely etiology for his symptoms. Will obtain sleep study after COVID outbreak is over.  Continue metoprolol succinate, amlodipine, lipitor-see below.  Hypertension: Very well controlled.  Hyperlipidemia: On Lipitor 80 mg daily. While LDL has remarkably reduced, his LFT"s have gove up. Will reduce Lipitor to 40 mg daily. Repeat LDL and CMP in 12/2018.  Elevated liver enzymes: Follow up wuth Dr. Watt Climes. Reduced Lipitor dose as above  I have discussed with patient regarding the safety during COVID Pandemic and steps and precautions to be taken including social distancing, frequent hand wash and use of detergent soap, gels with the patient. I asked the patient to avoid touching mouth, nose, eyes, ears with her hands. I encouraged heart healthy diet, regular exercise- as long  as social distancing can be maintained.  Follow up in 12/2018 after CMP, lipid panel.    Nigel Mormon, MD [facility] Pager: [phone] Office: [phone] If no answer Cell [phone]

## 2018-09-08 ENCOUNTER — Encounter: Payer: Self-pay | Admitting: Cardiology

## 2018-09-08 ENCOUNTER — Ambulatory Visit: Payer: Federal, State, Local not specified - PPO | Admitting: Cardiology

## 2018-09-08 ENCOUNTER — Other Ambulatory Visit: Payer: Self-pay

## 2018-09-08 ENCOUNTER — Encounter (HOSPITAL_COMMUNITY): Payer: Federal, State, Local not specified - PPO

## 2018-09-08 VITALS — BP 117/78 | HR 60 | Ht 71.0 in | Wt 220.0 lb

## 2018-09-08 DIAGNOSIS — I2510 Atherosclerotic heart disease of native coronary artery without angina pectoris: Secondary | ICD-10-CM | POA: Diagnosis not present

## 2018-09-08 DIAGNOSIS — N183 Chronic kidney disease, stage 3 unspecified: Secondary | ICD-10-CM

## 2018-09-08 DIAGNOSIS — R748 Abnormal levels of other serum enzymes: Secondary | ICD-10-CM | POA: Diagnosis not present

## 2018-09-08 DIAGNOSIS — R5383 Other fatigue: Secondary | ICD-10-CM

## 2018-09-08 MED ORDER — ATORVASTATIN CALCIUM 40 MG PO TABS: 40.0000 mg | ORAL_TABLET | Freq: Every day | ORAL | 3 refills | Status: DC

## 2018-09-08 MED ORDER — METOPROLOL SUCCINATE ER 25 MG PO TB24: 50.0000 mg | ORAL_TABLET | Freq: Every day | ORAL | 3 refills | Status: DC

## 2018-09-10 ENCOUNTER — Telehealth (HOSPITAL_COMMUNITY): Payer: Self-pay | Admitting: Cardiac Rehabilitation

## 2018-09-10 ENCOUNTER — Encounter (HOSPITAL_COMMUNITY): Payer: Federal, State, Local not specified - PPO

## 2018-09-10 NOTE — Telephone Encounter (Signed)
Pt phone call to inform of continued Outpatient Cardiac Rehab departmental closure for COVID 19 precautions. Future opening date to be determined.  Pt instructed to continue exercising on his own following home exercise guidelines. Pt advised to contact cardiology or PCP PRN symptoms, questions or concerns. Understanding verbalized.   Pt denies food insecurity or other needs at this time.  Pt offered emotional support and understanding. Pt expressed gratitude for the  call and is interested in resuming  CRPII when available.  Andi Hence, RN, BSN Cardiac Pulmonary Rehab

## 2018-09-12 ENCOUNTER — Encounter (HOSPITAL_COMMUNITY): Payer: Federal, State, Local not specified - PPO

## 2018-09-15 ENCOUNTER — Encounter (HOSPITAL_COMMUNITY): Payer: Federal, State, Local not specified - PPO

## 2018-09-17 ENCOUNTER — Encounter (HOSPITAL_COMMUNITY): Payer: Federal, State, Local not specified - PPO

## 2018-09-19 ENCOUNTER — Encounter (HOSPITAL_COMMUNITY): Payer: Federal, State, Local not specified - PPO

## 2018-09-22 ENCOUNTER — Encounter (HOSPITAL_COMMUNITY): Payer: Federal, State, Local not specified - PPO

## 2018-09-24 ENCOUNTER — Encounter (HOSPITAL_COMMUNITY): Payer: Federal, State, Local not specified - PPO

## 2018-09-26 ENCOUNTER — Encounter (HOSPITAL_COMMUNITY): Payer: Federal, State, Local not specified - PPO

## 2018-09-29 ENCOUNTER — Encounter (HOSPITAL_COMMUNITY): Payer: Federal, State, Local not specified - PPO

## 2018-10-01 ENCOUNTER — Encounter (HOSPITAL_COMMUNITY): Payer: Federal, State, Local not specified - PPO

## 2018-10-02 DIAGNOSIS — R748 Abnormal levels of other serum enzymes: Secondary | ICD-10-CM | POA: Diagnosis not present

## 2018-10-03 ENCOUNTER — Encounter (HOSPITAL_COMMUNITY): Payer: Federal, State, Local not specified - PPO

## 2018-10-06 ENCOUNTER — Encounter (HOSPITAL_COMMUNITY): Payer: Federal, State, Local not specified - PPO

## 2018-10-08 ENCOUNTER — Encounter (HOSPITAL_COMMUNITY): Payer: Federal, State, Local not specified - PPO

## 2018-10-10 ENCOUNTER — Encounter (HOSPITAL_COMMUNITY): Payer: Federal, State, Local not specified - PPO

## 2018-10-13 ENCOUNTER — Encounter (HOSPITAL_COMMUNITY): Payer: Federal, State, Local not specified - PPO

## 2018-10-15 ENCOUNTER — Encounter (HOSPITAL_COMMUNITY): Payer: Federal, State, Local not specified - PPO

## 2018-10-17 ENCOUNTER — Encounter (HOSPITAL_COMMUNITY): Payer: Federal, State, Local not specified - PPO

## 2018-10-20 ENCOUNTER — Other Ambulatory Visit: Payer: Self-pay | Admitting: Gastroenterology

## 2018-10-20 ENCOUNTER — Encounter (HOSPITAL_COMMUNITY): Payer: Federal, State, Local not specified - PPO

## 2018-10-20 DIAGNOSIS — R748 Abnormal levels of other serum enzymes: Secondary | ICD-10-CM

## 2018-10-22 ENCOUNTER — Encounter (HOSPITAL_COMMUNITY): Payer: Federal, State, Local not specified - PPO

## 2018-10-24 ENCOUNTER — Encounter (HOSPITAL_COMMUNITY): Payer: Federal, State, Local not specified - PPO

## 2018-10-28 ENCOUNTER — Telehealth (HOSPITAL_COMMUNITY): Payer: Self-pay | Admitting: *Deleted

## 2018-10-28 NOTE — Telephone Encounter (Signed)
PC to patient regarding virtual cardiac rehab option while face to face cardiac rehab is closed.  Explained program to patient and pt expresses interest in joining program. Virtual Cardiac Rehab will be set up with patient.

## 2018-10-29 ENCOUNTER — Encounter (HOSPITAL_COMMUNITY): Payer: Self-pay

## 2018-10-29 ENCOUNTER — Encounter (HOSPITAL_COMMUNITY): Payer: Federal, State, Local not specified - PPO

## 2018-10-29 NOTE — Progress Notes (Signed)
Transitioning to virtual cardiac rehab   Dr.  Virgina Jock   As you are aware our department remains closed to patients due to Covid-19.  We are excited to be able to offer an alternative to traditional onsite Cardiac Rehab while your patient continues to follow Re-Open guidelines.  This is a notification that your patient has been contacted and is very interested in participating in Virtual Cardiac Rehab.  Thank you for your continued support in helping [place] meet the health care needs of our patients.  Tedra [place]. Support Rep II   Cardiac Rehab staff

## 2018-10-31 ENCOUNTER — Other Ambulatory Visit: Payer: Self-pay | Admitting: Gastroenterology

## 2018-10-31 ENCOUNTER — Ambulatory Visit
Admission: RE | Admit: 2018-10-31 | Discharge: 2018-10-31 | Disposition: A | Discharge status: Home / Self Care | Payer: Federal, State, Local not specified - PPO | Source: Ambulatory Visit | Attending: Gastroenterology | Admitting: Gastroenterology

## 2018-10-31 ENCOUNTER — Encounter (HOSPITAL_COMMUNITY): Payer: Federal, State, Local not specified - PPO

## 2018-10-31 DIAGNOSIS — R9389 Abnormal findings on diagnostic imaging of other specified body structures: Secondary | ICD-10-CM

## 2018-10-31 DIAGNOSIS — R748 Abnormal levels of other serum enzymes: Secondary | ICD-10-CM

## 2018-10-31 DIAGNOSIS — R160 Hepatomegaly, not elsewhere classified: Secondary | ICD-10-CM | POA: Diagnosis not present

## 2018-10-31 IMAGING — US ULTRASOUND ABDOMEN COMPLETE
1 series · 14 of 25 positions shown · non-contrast
Comparison: None.

CLINICAL DATA: Abnormal AST and ALT

EXAM:
ABDOMEN ULTRASOUND COMPLETE

[Series 1: ultrasound abdomen complete · 0.14mm/px · 14 of 105 slices shown]
[im 1/105]
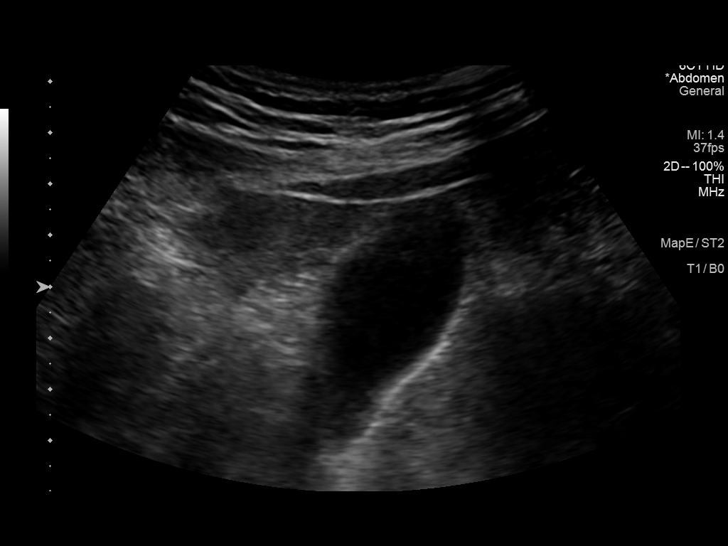
[im 9/105]
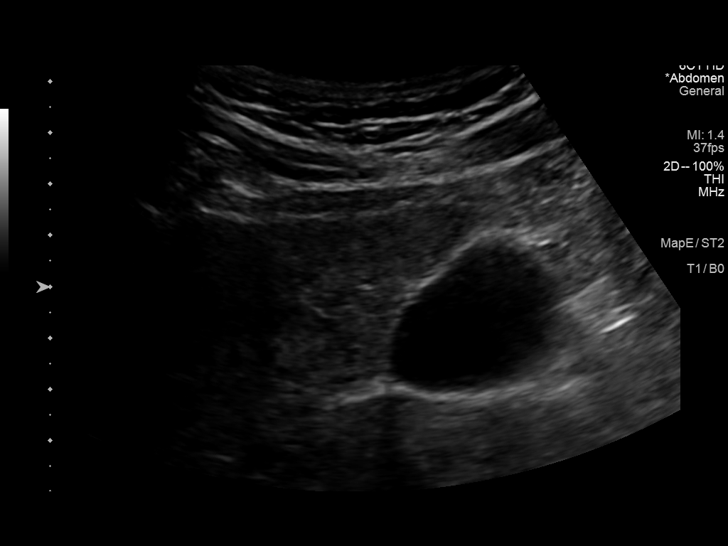
[im 18/105]
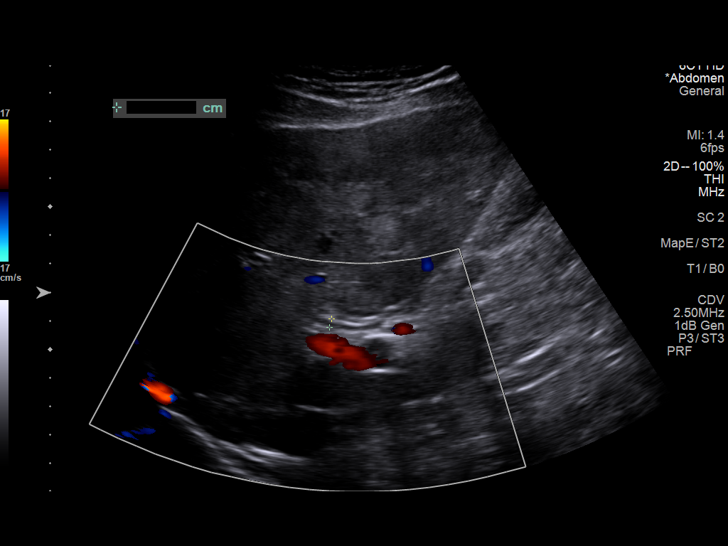
[im 27/105]
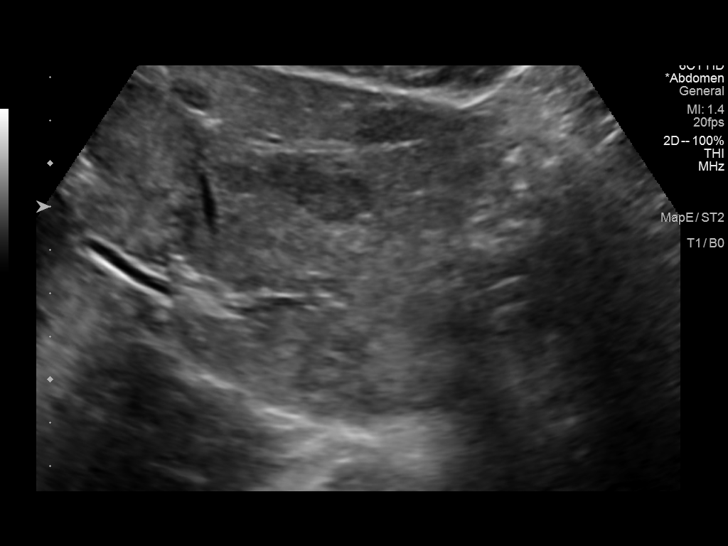
[im 35/105]
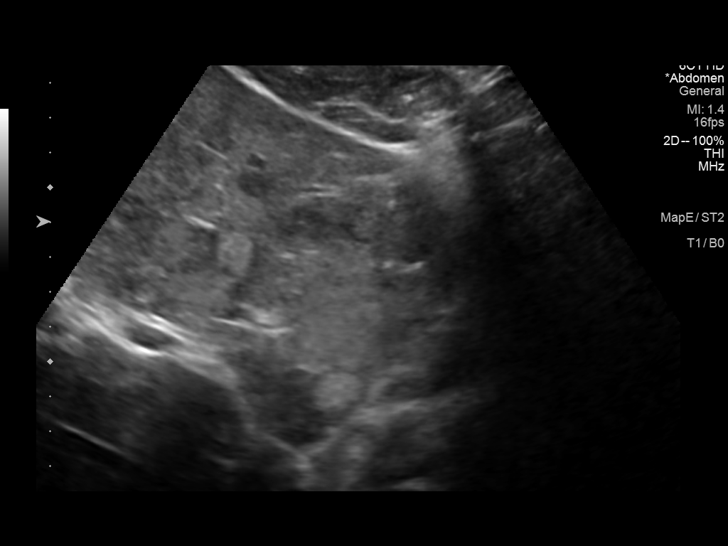
[im 40/105]
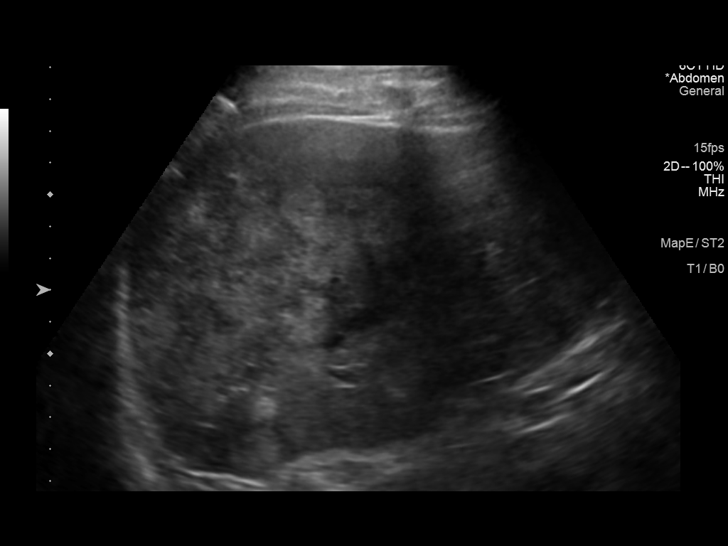
[im 48/105]
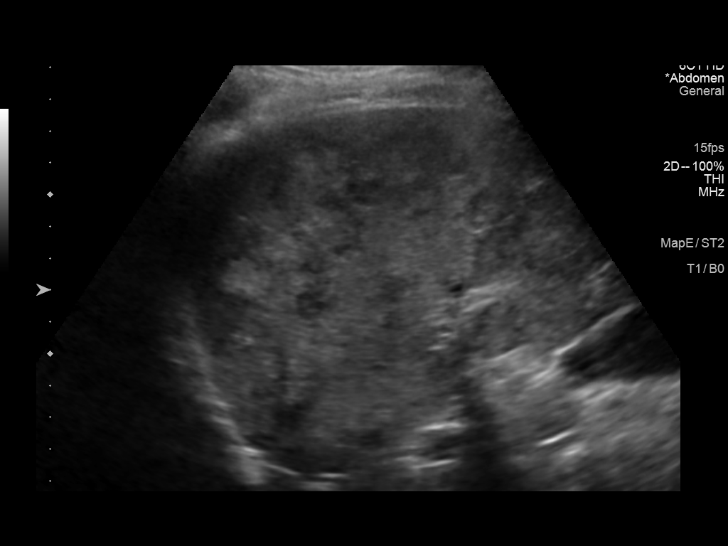
[im 57/105]
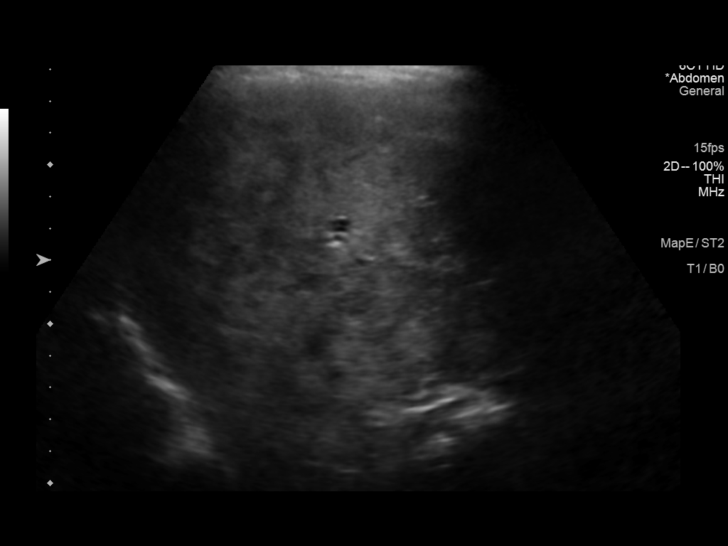
[im 66/105]
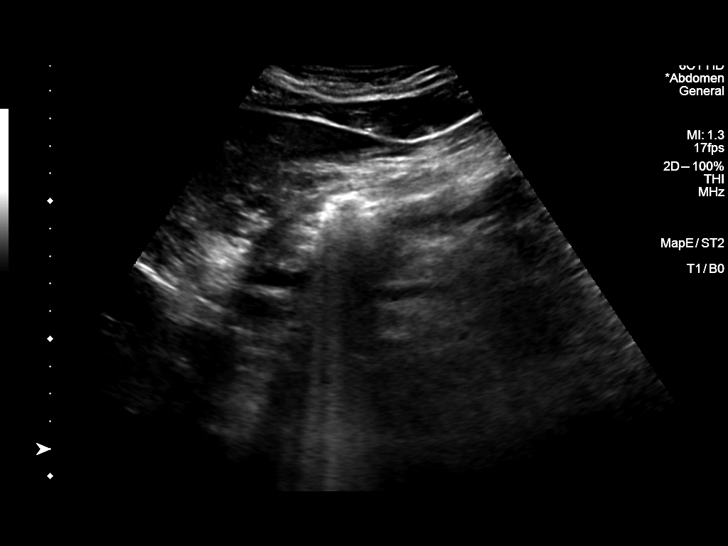
[im 70/105]
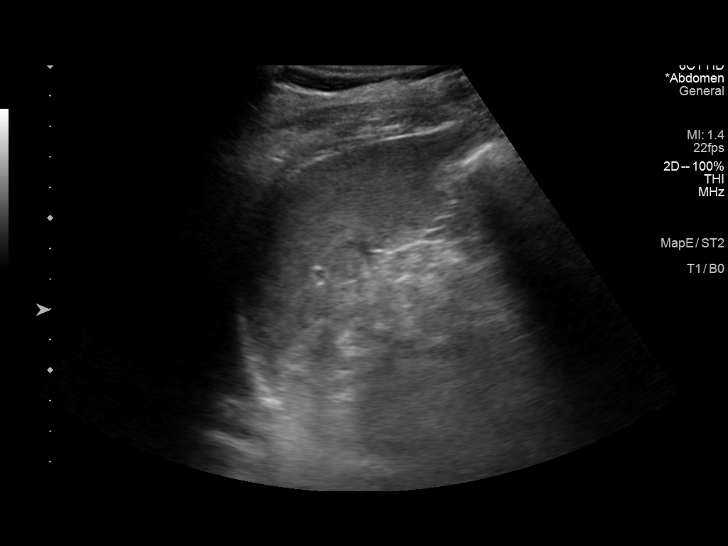
[im 79/105]
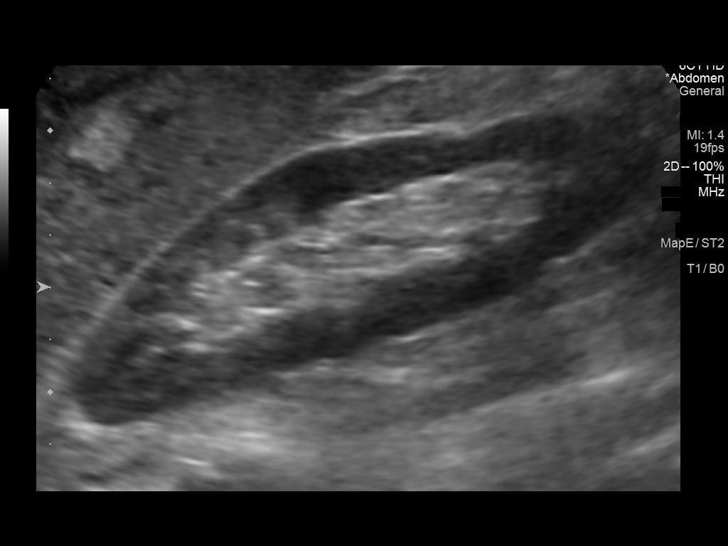
[im 87/105]
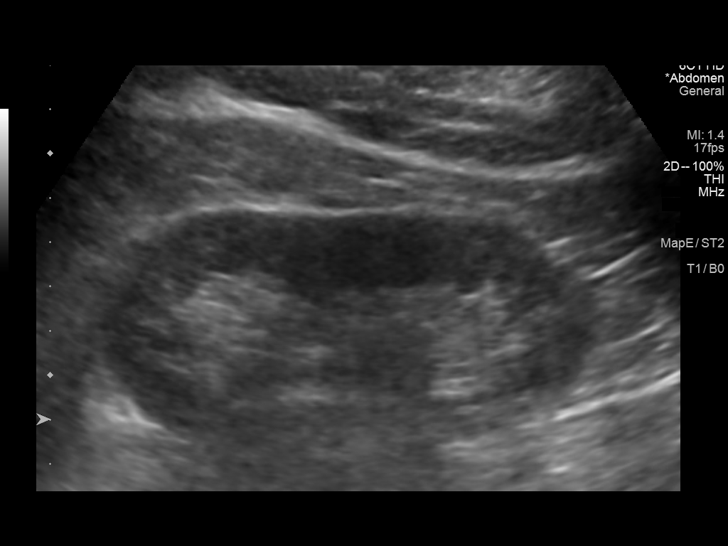
[im 96/105]
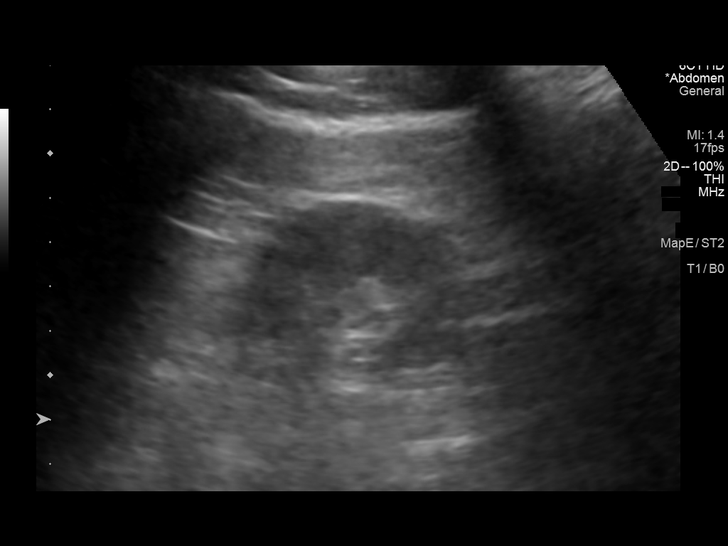
[im 105/105]
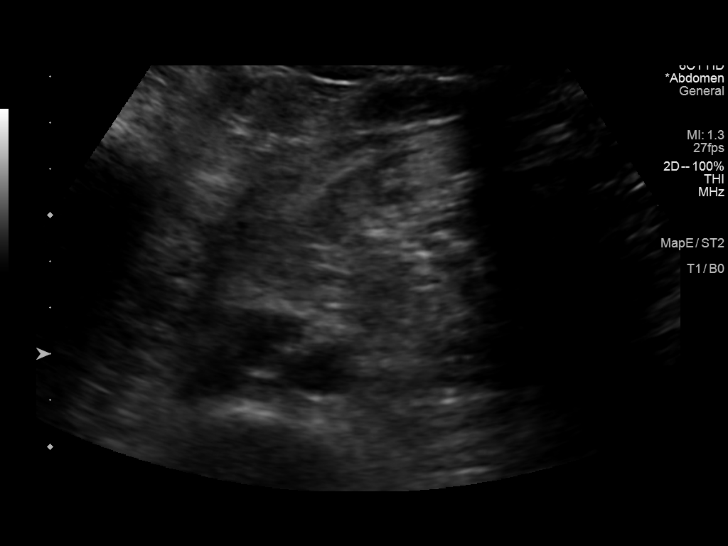

[14 of 25 positions shown; findings below may reference images not displayed]

FINDINGS: Gallbladder: No gallstones or wall thickening visualized. No
sonographic Murphy sign noted by sonographer.

Common bile duct: Diameter: 3 mm

Liver: Heterogeneous liver due to numerous nearly confluent
heterogeneous masses which measure up to 3.5 cm where discretely
visible. Portal vein is patent on color Doppler imaging with normal
direction of blood flow towards the liver.

IVC: No abnormality visualized.

Pancreas: Obscured by bowel gas

Spleen: Size and appearance within normal limits.

Right Kidney: Length: 11.7 cm. Echogenicity within normal limits. No
mass or hydronephrosis visualized.

Left Kidney: Length: 11.4 cm. Echogenicity within normal limits. No
mass or hydronephrosis visualized.

Abdominal aorta: No aneurysm visualized.

Other: These results will be called to the ordering clinician or
representative by the Radiologist Assistant, and communication
documented in the PACS or zVision Dashboard.
IMPRESSION: Innumerable liver masses, most likely metastatic disease. Recommend
enhanced CT.

## 2018-11-03 ENCOUNTER — Telehealth (HOSPITAL_COMMUNITY): Payer: Self-pay

## 2018-11-03 ENCOUNTER — Encounter (HOSPITAL_COMMUNITY): Payer: Federal, State, Local not specified - PPO

## 2018-11-03 NOTE — Telephone Encounter (Signed)
° °        Confirm Consent - In the setting of the current Covid19 crisis, you are scheduled for a phone visit with your Cardiac or Pulmonary team member.  Just as we do with many in-gym visits, in order for you to participate in this visit, we must obtain consent.  If you'd like, I can send this to your mychart (if signed up) or email for you to review.  Otherwise, I can obtain your verbal consent now.  By agreeing to a telephone visit, we'd like you to understand that the technology does not allow for your Cardiac or Pulmonary Rehab team member to perform a physical assessment, and thus may limit their ability to fully assess your ability to perform exercise programs. If your provider identifies any concerns that need to be evaluated in person, we will make arrangements to do so.  Finally, though the technology is pretty good, we cannot assure that it will always work on either your or our end and we cannot ensure that we have a secure connection.  Cardiac and Pulmonary Rehab Telehealth visits and At Home cardiac and pulmonary rehab are provided at no cost to you.               Are you willing to proceed?" STAFF: Did the patient verbally acknowledge consent to telehealth visit? Document YES/NO here: Yes      Jessica C.   Cardiac and Pulmonary Rehab Staff   D6/06/2018 T10:06AM

## 2018-11-04 ENCOUNTER — Other Ambulatory Visit: Payer: Self-pay

## 2018-11-04 ENCOUNTER — Telehealth (HOSPITAL_COMMUNITY): Payer: Self-pay | Admitting: *Deleted

## 2018-11-04 ENCOUNTER — Encounter (HOSPITAL_COMMUNITY): Payer: Self-pay

## 2018-11-04 ENCOUNTER — Ambulatory Visit
Admission: RE | Admit: 2018-11-04 | Discharge: 2018-11-04 | Disposition: A | Discharge status: Home / Self Care | Payer: Federal, State, Local not specified - PPO | Source: Ambulatory Visit | Attending: Gastroenterology | Admitting: Gastroenterology

## 2018-11-04 DIAGNOSIS — R160 Hepatomegaly, not elsewhere classified: Secondary | ICD-10-CM | POA: Diagnosis not present

## 2018-11-04 DIAGNOSIS — K7689 Other specified diseases of liver: Secondary | ICD-10-CM | POA: Diagnosis not present

## 2018-11-04 DIAGNOSIS — R9389 Abnormal findings on diagnostic imaging of other specified body structures: Secondary | ICD-10-CM

## 2018-11-04 DIAGNOSIS — N200 Calculus of kidney: Secondary | ICD-10-CM | POA: Diagnosis not present

## 2018-11-04 IMAGING — CT CT ABDOMEN AND PELVIS WITH CONTRAST
1 of 3 series · 12 of 32 positions shown, 18 images · IV contrast (APPLIED)
Comparison: Abdominal ultrasound [DATE].

CLINICAL DATA: Abnormal ultrasound with multiple liver masses.
Evaluate for metastatic disease. Elevated liver function studies.

Creatinine was obtained on site at [HOSPITAL] at [HOSPITAL].
Results: Creatinine 1.8 mg/dL.
EXAM:
CT ABDOMEN AND PELVIS WITH CONTRAST
TECHNIQUE: Multidetector CT imaging of the abdomen and pelvis was performed
using the standard protocol following bolus administration of
intravenous contrast.
CONTRAST:  80mL [XW] IOPAMIDOL ([XW]) INJECTION 61%

[Series 2: abd/pelvis w/cm · axial · 0.88mm/px · z∈[-516,-96]mm · 12 of 100 slices shown, 18 images]
[im 8/100  soft-tissue]
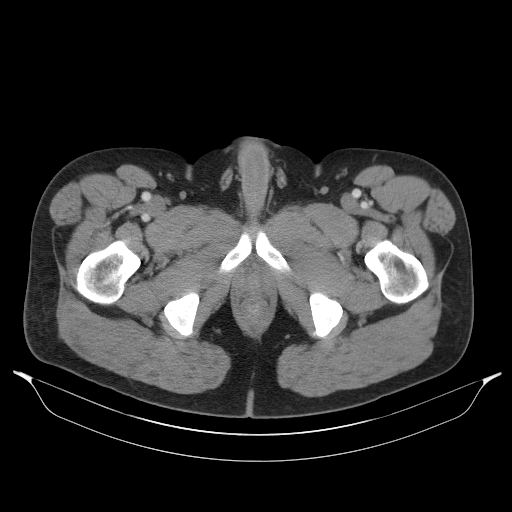
[im 8/100  bone]
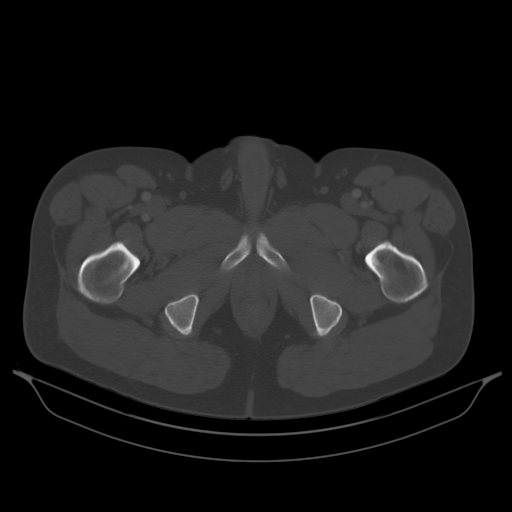
[im 15/100  soft-tissue]
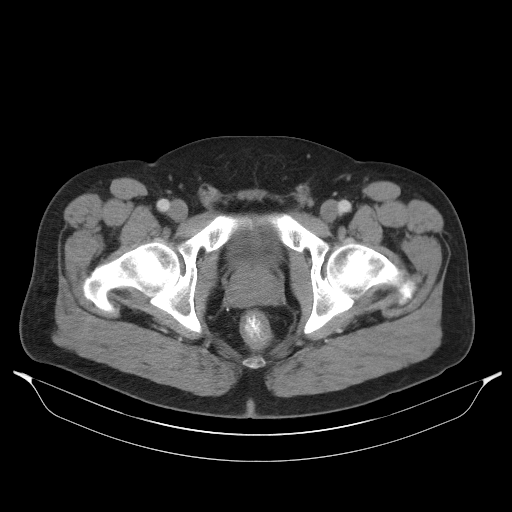
[im 22/100  soft-tissue]
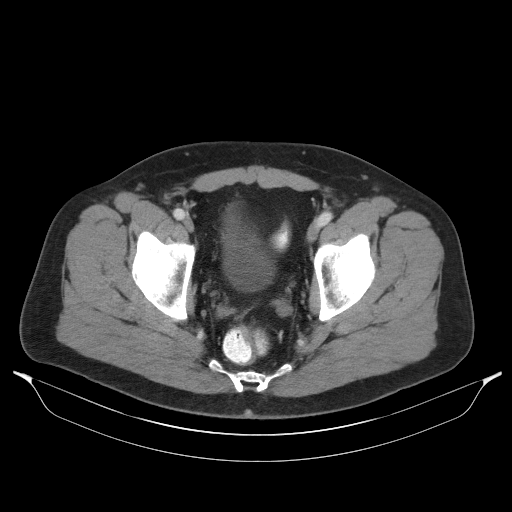
[im 29/100  soft-tissue]
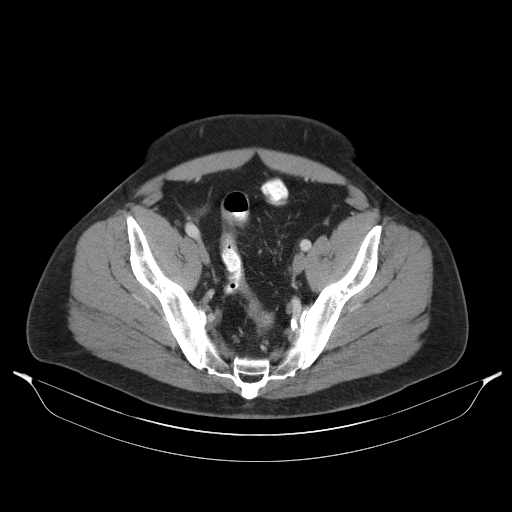
[im 36/100  soft-tissue]
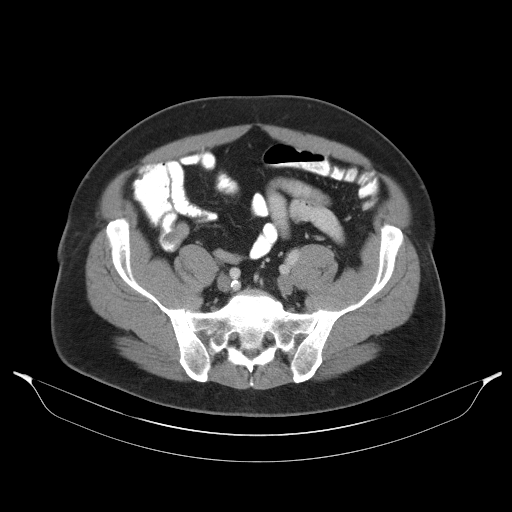
[im 43/100  soft-tissue]
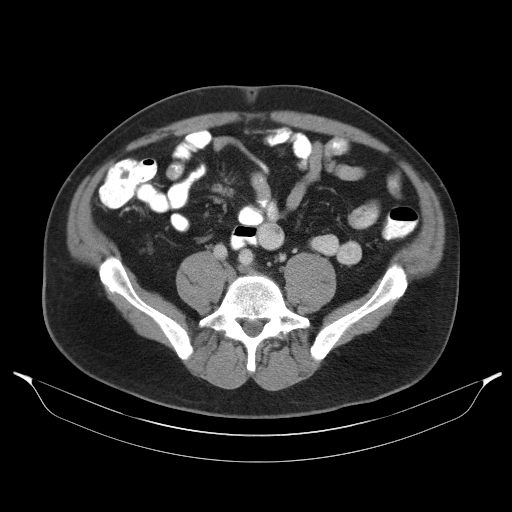
[im 57/100  soft-tissue]
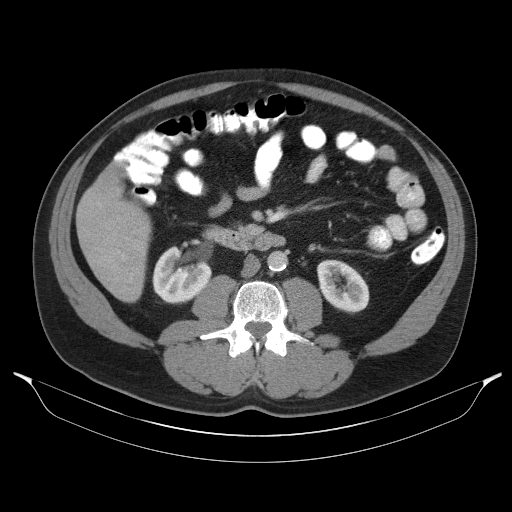
[im 64/100  soft-tissue]
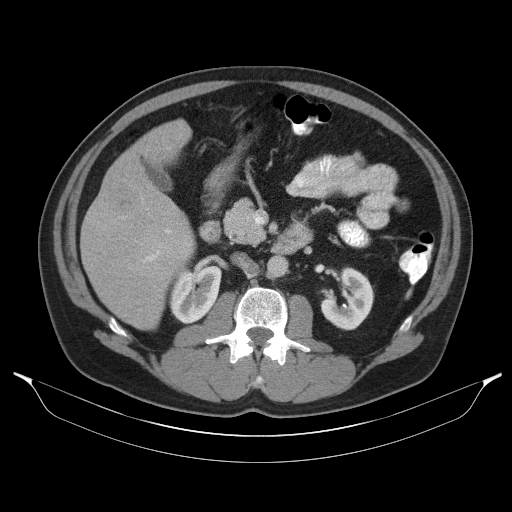
[im 71/100  soft-tissue]
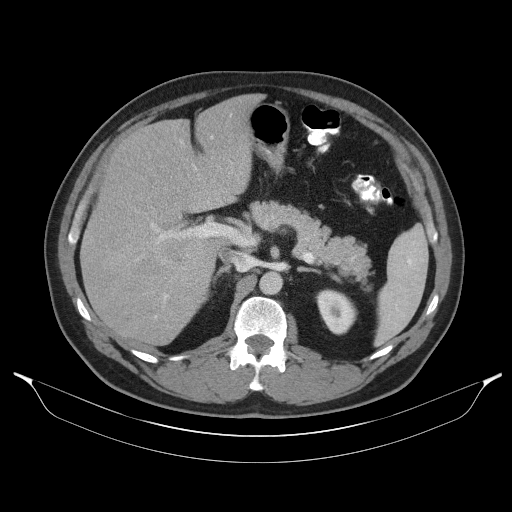
[im 71/100  lung]
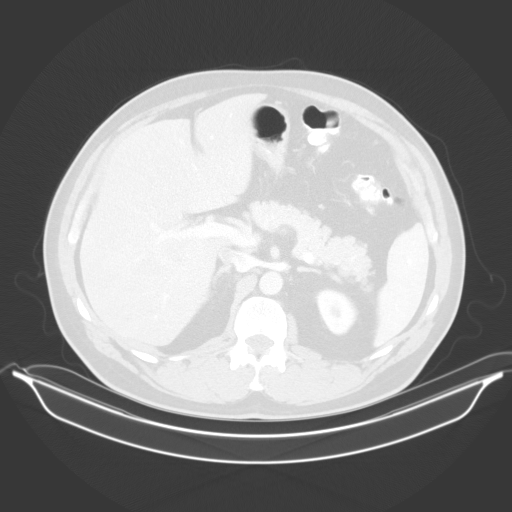
[im 71/100  bone]
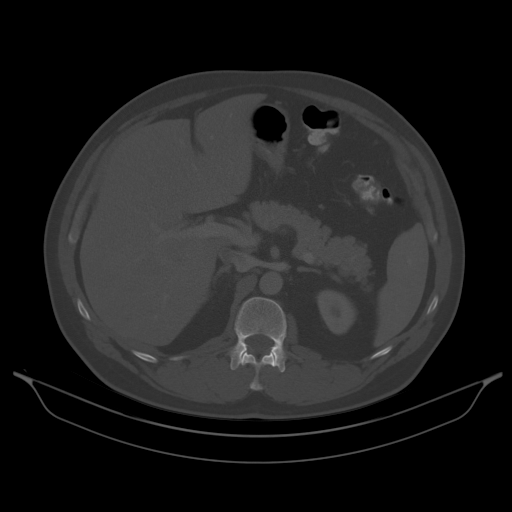
[im 78/100  soft-tissue]
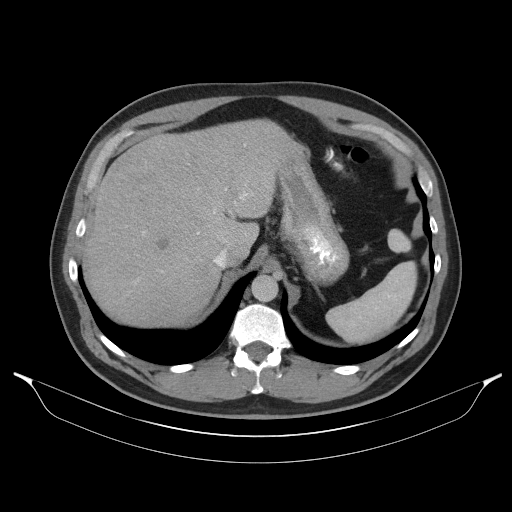
[im 78/100  lung]
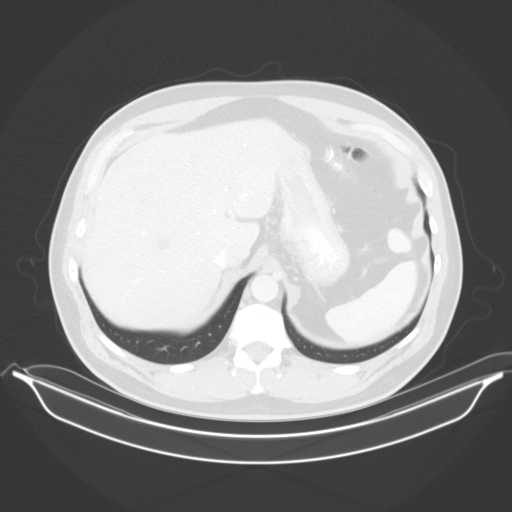
[im 85/100  soft-tissue]
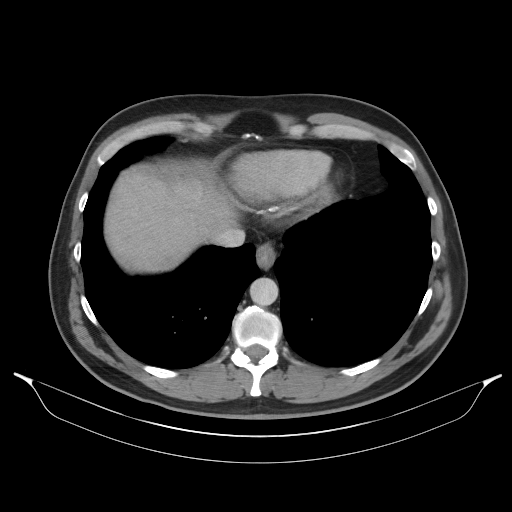
[im 85/100  lung]
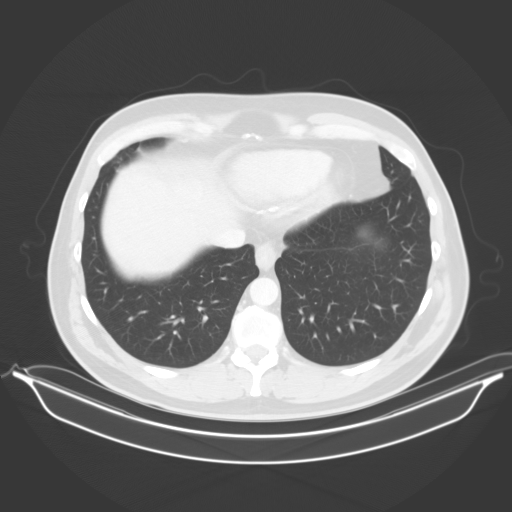
[im 92/100  soft-tissue]
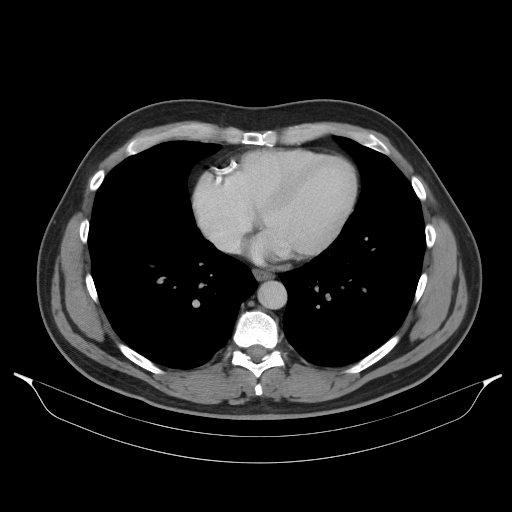
[im 92/100  lung]
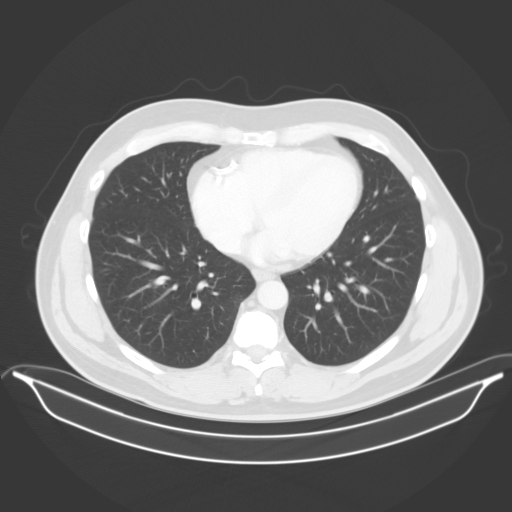

[12 of 32 positions shown; findings below may reference images not displayed]

FINDINGS: Lower chest: Clear lung bases. No significant pleural or pericardial
effusion. There are prominent coronary artery calcifications and
mild wall thickening of the distal esophagus.

Hepatobiliary: The liver is very heterogeneous in density with
suspicion of multiple nearly isodense enhancing hepatic lesions.
There is a lesion inferiorly in the right lobe measuring up to
cm on image 36/2. There are lower density components more superiorly
in the right lobe measuring 14 mm on image [DATE] and 17 mm on image
[DATE]. These lesions are much more obvious on ultrasound than CT. No
evidence of gallstones, gallbladder wall thickening or biliary
dilatation.

Pancreas: Unremarkable. No pancreatic ductal dilatation or
surrounding inflammatory changes.

Spleen: Normal in size without focal abnormality.

Adrenals/Urinary Tract: Both adrenal glands appear normal. There is
a small nonobstructing calculus centrally in the left kidney. No
evidence of ureteral calculus or hydronephrosis. There is no renal
mass. The bladder appears normal.

Stomach/Bowel: No evidence of bowel wall thickening, distention or
surrounding inflammatory change. The appendix appears normal. No
obvious mucosal lesion of the small or large bowel. There is a short
segment of distal small bowel in the right lower quadrant which is
not opacified with contrast, but shows no definite mass lesion or
upstream dilatation.

Vascular/Lymphatic: There is a central partially calcified
spiculated mesenteric mass measuring 3.4 x 3.1 cm on image 56/2,
highly suspicious for nodal disease from carcinoid tumor. This may
involve and partially obstruct the superior mesenteric vein
peripheral branches. No other mesenteric masses or enlarged
abdominal lymph nodes are identified. The portal vein and central
portions of the superior mesenteric and splenic veins are patent.
Mild aortic and branch vessel atherosclerosis.

Reproductive: Mild enlargement of the prostate gland. The seminal
vesicles appear normal. Left scrotal vasectomy clip noted.

Other: No ascites or peritoneal nodularity.

Musculoskeletal: No acute or significant osseous findings.
IMPRESSION: 1. Findings are highly suspicious for metastatic carcinoid tumor
with multiple heterogeneous liver masses and a partially calcified
central mesenteric mass. No obvious primary small bowel tumor or
bowel obstruction. The hepatic lesions are much more obvious on
ultrasound and CT. Biopsy under ultrasound guidance should be
possible. For future follow-up, MRI or multiphasic CT recommended.
2. No other evidence of metastatic disease.
3. Nonobstructing left renal calculus.
4. Coronary and Aortic Atherosclerosis ([XW]-[XW]).

## 2018-11-04 MED ORDER — IOPAMIDOL (ISOVUE-300) INJECTION 61%
100.0000 mL | Freq: Once | INTRAVENOUS | Status: AC | PRN
  Administered 2018-11-04: 80 mL via INTRAVENOUS

## 2018-11-04 NOTE — Telephone Encounter (Signed)
Called patient to help set up virtual cardiac rehab app.  No answer. VM left for patient to callback CR.

## 2018-11-05 ENCOUNTER — Other Ambulatory Visit: Payer: Self-pay

## 2018-11-05 ENCOUNTER — Encounter (HOSPITAL_COMMUNITY): Payer: Federal, State, Local not specified - PPO

## 2018-11-06 ENCOUNTER — Encounter: Payer: Self-pay | Admitting: Cardiology

## 2018-11-06 ENCOUNTER — Other Ambulatory Visit (HOSPITAL_COMMUNITY): Payer: Self-pay | Admitting: Gastroenterology

## 2018-11-06 DIAGNOSIS — R9389 Abnormal findings on diagnostic imaging of other specified body structures: Secondary | ICD-10-CM

## 2018-11-06 DIAGNOSIS — C7B00 Secondary carcinoid tumors, unspecified site: Secondary | ICD-10-CM

## 2018-11-06 MED ORDER — ASPIRIN 81 MG PO CHEW: 81.0000 mg | CHEWABLE_TABLET | Freq: Every day | ORAL | 0 refills | Status: DC

## 2018-11-06 MED ORDER — NITROGLYCERIN 0.4 MG SL SUBL: 0.4000 mg | SUBLINGUAL_TABLET | SUBLINGUAL | 1 refills | Status: DC | PRN

## 2018-11-06 MED ORDER — TICAGRELOR 90 MG PO TABS: 90.0000 mg | ORAL_TABLET | Freq: Two times a day (BID) | ORAL | 3 refills | Status: DC

## 2018-11-06 MED ORDER — AMLODIPINE BESYLATE 5 MG PO TABS: 5.0000 mg | ORAL_TABLET | Freq: Every day | ORAL | 0 refills | Status: DC

## 2018-11-07 ENCOUNTER — Encounter (HOSPITAL_COMMUNITY): Payer: Federal, State, Local not specified - PPO

## 2018-11-07 DIAGNOSIS — C7A8 Other malignant neuroendocrine tumors: Secondary | ICD-10-CM

## 2018-11-07 HISTORY — DX: Other malignant neuroendocrine tumors: C7A.8

## 2018-11-10 ENCOUNTER — Encounter (HOSPITAL_COMMUNITY): Payer: Federal, State, Local not specified - PPO

## 2018-11-10 DIAGNOSIS — R932 Abnormal findings on diagnostic imaging of liver and biliary tract: Secondary | ICD-10-CM | POA: Diagnosis not present

## 2018-11-10 DIAGNOSIS — R748 Abnormal levels of other serum enzymes: Secondary | ICD-10-CM | POA: Diagnosis not present

## 2018-11-10 NOTE — Telephone Encounter (Signed)
Forwarded message

## 2018-11-12 ENCOUNTER — Encounter (HOSPITAL_COMMUNITY): Payer: Federal, State, Local not specified - PPO

## 2018-11-12 ENCOUNTER — Other Ambulatory Visit: Payer: Self-pay | Admitting: Student

## 2018-11-12 ENCOUNTER — Other Ambulatory Visit: Payer: Self-pay | Admitting: Radiology

## 2018-11-13 ENCOUNTER — Encounter (HOSPITAL_COMMUNITY): Payer: Self-pay

## 2018-11-13 ENCOUNTER — Other Ambulatory Visit: Payer: Self-pay

## 2018-11-13 ENCOUNTER — Ambulatory Visit (HOSPITAL_COMMUNITY)
Admission: RE | Admit: 2018-11-13 | Discharge: 2018-11-13 | Disposition: A | Discharge status: Home / Self Care | Payer: Federal, State, Local not specified - PPO | Source: Ambulatory Visit | Attending: Gastroenterology | Admitting: Gastroenterology

## 2018-11-13 DIAGNOSIS — C7B8 Other secondary neuroendocrine tumors: Secondary | ICD-10-CM | POA: Insufficient documentation

## 2018-11-13 DIAGNOSIS — I10 Essential (primary) hypertension: Secondary | ICD-10-CM | POA: Diagnosis not present

## 2018-11-13 DIAGNOSIS — C801 Malignant (primary) neoplasm, unspecified: Secondary | ICD-10-CM | POA: Diagnosis not present

## 2018-11-13 DIAGNOSIS — Z880 Allergy status to penicillin: Secondary | ICD-10-CM | POA: Diagnosis not present

## 2018-11-13 DIAGNOSIS — E785 Hyperlipidemia, unspecified: Secondary | ICD-10-CM | POA: Diagnosis not present

## 2018-11-13 DIAGNOSIS — Z79899 Other long term (current) drug therapy: Secondary | ICD-10-CM | POA: Insufficient documentation

## 2018-11-13 DIAGNOSIS — Z7982 Long term (current) use of aspirin: Secondary | ICD-10-CM | POA: Diagnosis not present

## 2018-11-13 DIAGNOSIS — I252 Old myocardial infarction: Secondary | ICD-10-CM | POA: Diagnosis not present

## 2018-11-13 DIAGNOSIS — Z87891 Personal history of nicotine dependence: Secondary | ICD-10-CM | POA: Insufficient documentation

## 2018-11-13 DIAGNOSIS — Z841 Family history of disorders of kidney and ureter: Secondary | ICD-10-CM | POA: Insufficient documentation

## 2018-11-13 DIAGNOSIS — Z8249 Family history of ischemic heart disease and other diseases of the circulatory system: Secondary | ICD-10-CM | POA: Diagnosis not present

## 2018-11-13 DIAGNOSIS — Z803 Family history of malignant neoplasm of breast: Secondary | ICD-10-CM | POA: Diagnosis not present

## 2018-11-13 DIAGNOSIS — Z955 Presence of coronary angioplasty implant and graft: Secondary | ICD-10-CM | POA: Diagnosis not present

## 2018-11-13 DIAGNOSIS — K7689 Other specified diseases of liver: Secondary | ICD-10-CM | POA: Diagnosis not present

## 2018-11-13 DIAGNOSIS — C7A8 Other malignant neuroendocrine tumors: Secondary | ICD-10-CM | POA: Diagnosis not present

## 2018-11-13 DIAGNOSIS — R9389 Abnormal findings on diagnostic imaging of other specified body structures: Secondary | ICD-10-CM

## 2018-11-13 DIAGNOSIS — Z7902 Long term (current) use of antithrombotics/antiplatelets: Secondary | ICD-10-CM | POA: Diagnosis not present

## 2018-11-13 DIAGNOSIS — I2510 Atherosclerotic heart disease of native coronary artery without angina pectoris: Secondary | ICD-10-CM | POA: Diagnosis not present

## 2018-11-13 DIAGNOSIS — Z833 Family history of diabetes mellitus: Secondary | ICD-10-CM | POA: Diagnosis not present

## 2018-11-13 DIAGNOSIS — C787 Secondary malignant neoplasm of liver and intrahepatic bile duct: Secondary | ICD-10-CM | POA: Diagnosis not present

## 2018-11-13 DIAGNOSIS — K769 Liver disease, unspecified: Secondary | ICD-10-CM | POA: Diagnosis present

## 2018-11-13 DIAGNOSIS — C7B00 Secondary carcinoid tumors, unspecified site: Secondary | ICD-10-CM

## 2018-11-13 LAB — COMPREHENSIVE METABOLIC PANEL
ALT: 64 U/L — ABNORMAL HIGH (ref 0–44)
AST: 78 U/L — ABNORMAL HIGH (ref 15–41)
Albumin: 4.2 g/dL (ref 3.5–5.0)
Alkaline Phosphatase: 49 U/L (ref 38–126)
Anion gap: 8 (ref 5–15)
BUN: 18 mg/dL (ref 6–20)
CO2: 25 mmol/L (ref 22–32)
Calcium: 9.3 mg/dL (ref 8.9–10.3)
Chloride: 108 mmol/L (ref 98–111)
Creatinine, Ser: 1.67 mg/dL — ABNORMAL HIGH (ref 0.61–1.24)
GFR calc Af Amer: 53 mL/min — ABNORMAL LOW (ref >60)
GFR calc non Af Amer: 45 mL/min — ABNORMAL LOW (ref >60)
Glucose, Bld: 88 mg/dL (ref 70–99)
Potassium: 4.2 mmol/L (ref 3.5–5.1)
Sodium: 141 mmol/L (ref 135–145)
Total Bilirubin: 0.8 mg/dL (ref 0.3–1.2)
Total Protein: 6.9 g/dL (ref 6.5–8.1)

## 2018-11-13 LAB — CBC WITH DIFFERENTIAL/PLATELET
Abs Immature Granulocytes: 0.03 10*3/uL (ref 0.00–0.07)
Basophils Absolute: 0.2 10*3/uL — ABNORMAL HIGH (ref 0.0–0.1)
Basophils Relative: 2 %
Eosinophils Absolute: 0.9 10*3/uL — ABNORMAL HIGH (ref 0.0–0.5)
Eosinophils Relative: 11 %
HCT: 43.2 % (ref 39.0–52.0)
Hemoglobin: 14.7 g/dL (ref 13.0–17.0)
Immature Granulocytes: 0 %
Lymphocytes Relative: 35 %
Lymphs Abs: 2.7 10*3/uL (ref 0.7–4.0)
MCH: 31.3 pg (ref 26.0–34.0)
MCHC: 34 g/dL (ref 30.0–36.0)
MCV: 92.1 fL (ref 80.0–100.0)
Monocytes Absolute: 0.6 10*3/uL (ref 0.1–1.0)
Monocytes Relative: 8 %
Neutro Abs: 3.5 10*3/uL (ref 1.7–7.7)
Neutrophils Relative %: 44 %
Platelets: 154 10*3/uL (ref 150–400)
RBC: 4.69 MIL/uL (ref 4.22–5.81)
RDW: 13 % (ref 11.5–15.5)
WBC: 7.8 10*3/uL (ref 4.0–10.5)
nRBC: 0 % (ref 0.0–0.2)

## 2018-11-13 LAB — PROTIME-INR
INR: 1 (ref 0.8–1.2)
Prothrombin Time: 13 seconds (ref 11.4–15.2)

## 2018-11-13 IMAGING — US ULTRASOUND BIOPSY CORE LIVER
1 series · 13 of 13 positions shown · non-contrast
Comparison: none

INDICATION: Liver metastases, suspect metastatic carcinoid

[Series 1: ultrasound biopsy core liver · 13 of 13 slices shown]
[im 1/13]
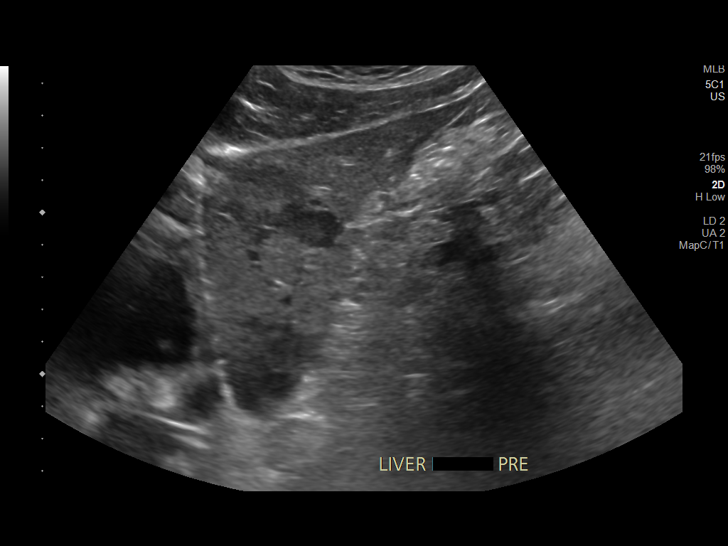
[im 2/13]
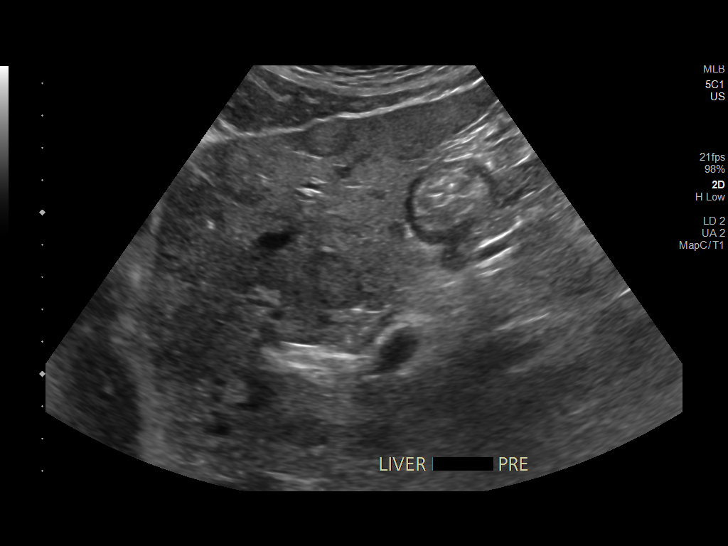
[im 3/13]
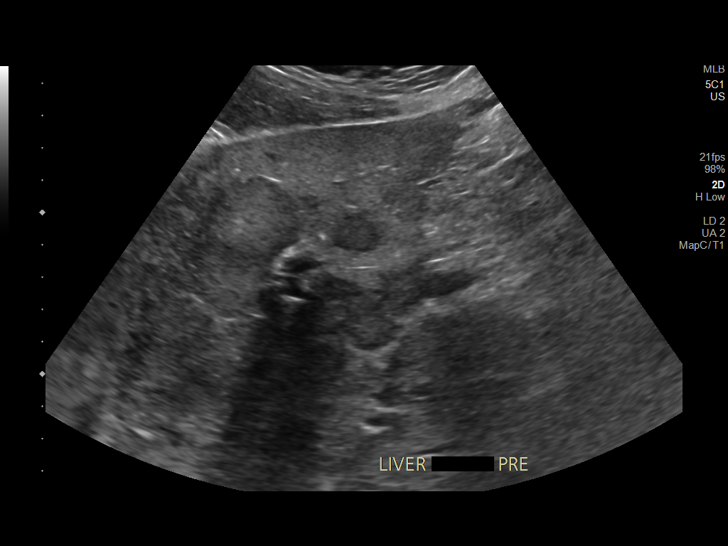
[im 4/13]
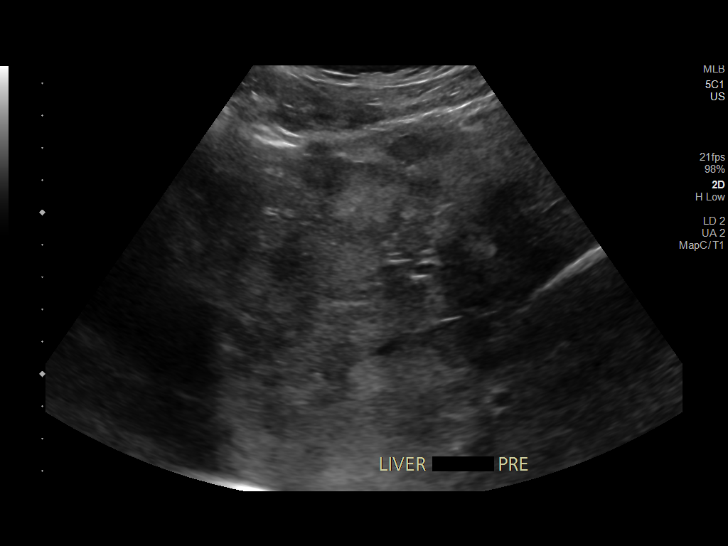
[im 5/13]
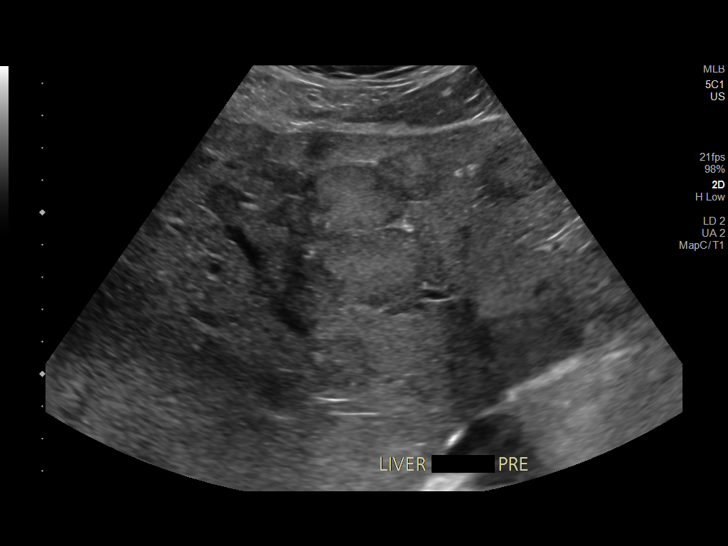
[im 6/13]
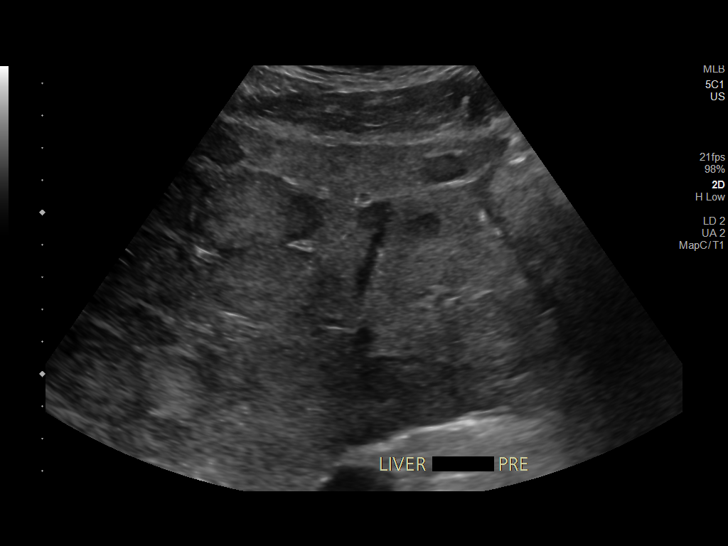
[im 7/13]
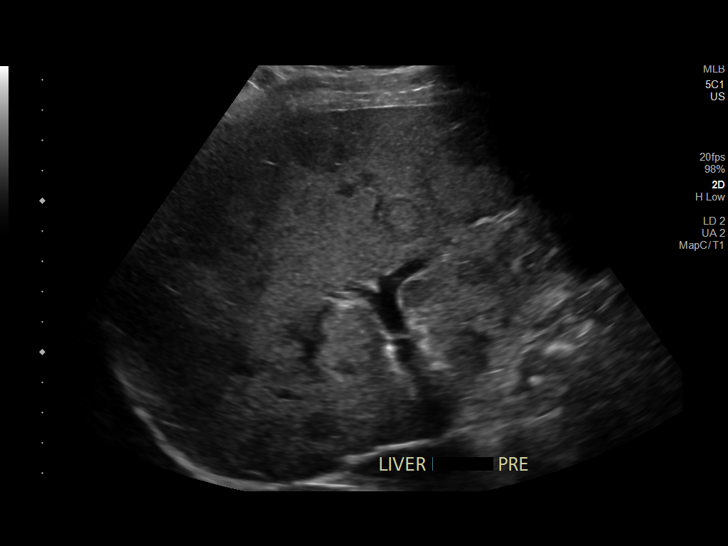
[im 8/13]
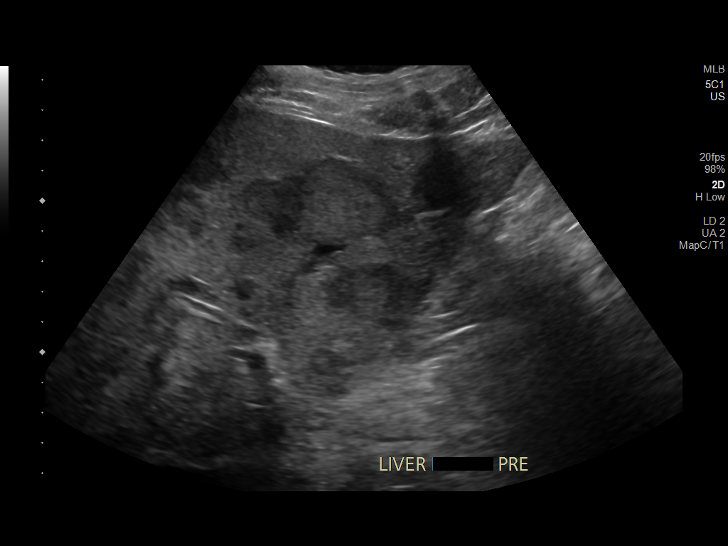
[im 9/13]
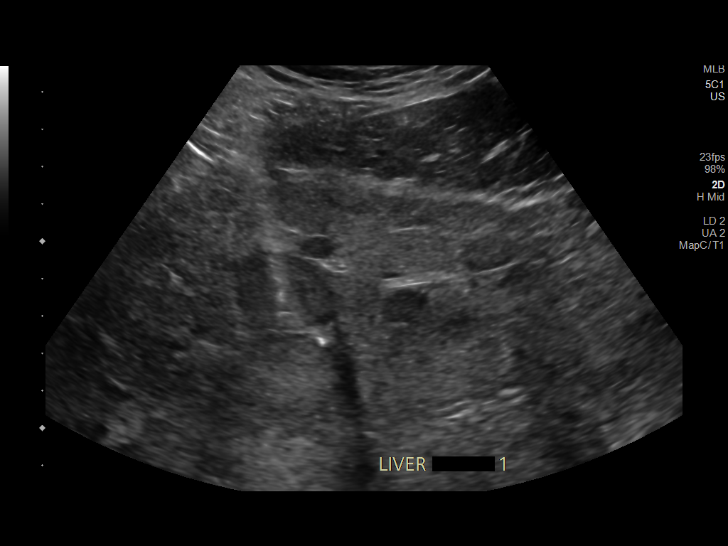
[im 10/13]
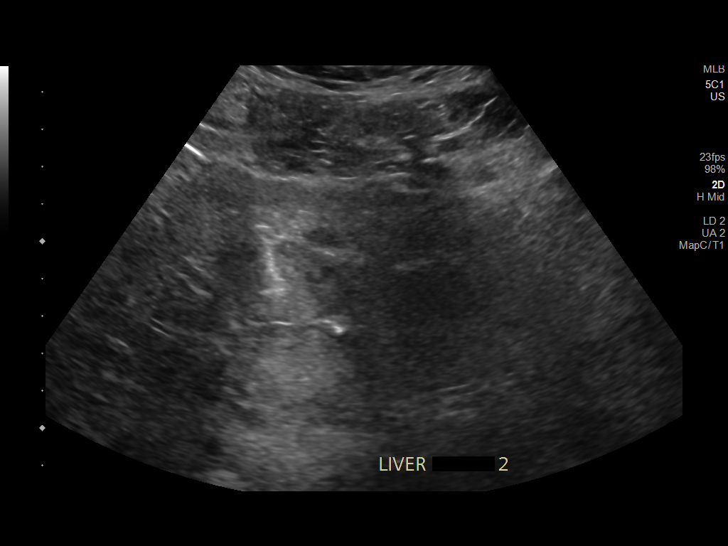
[im 11/13]
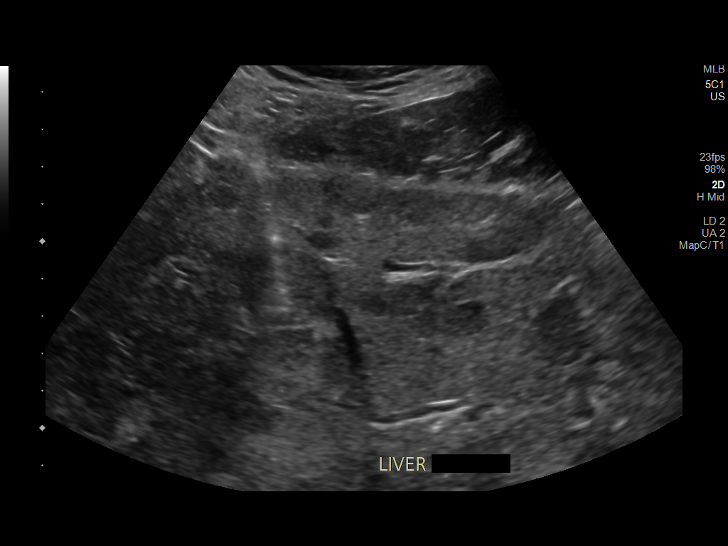
[im 12/13]
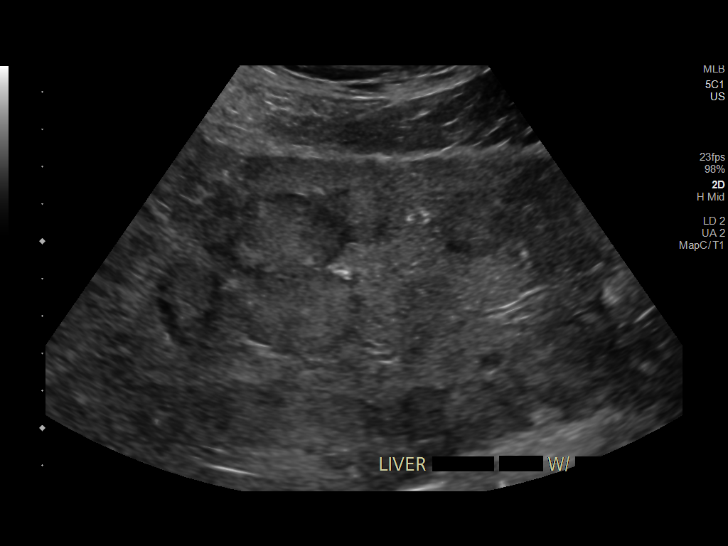
[im 13/13]
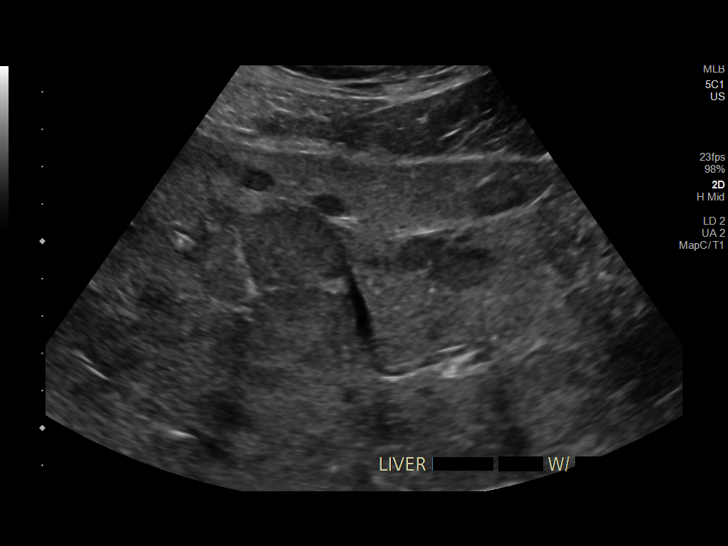

[13 of 13 positions shown; findings below may reference images not displayed]

EXAM:
ULTRASOUND LEFT LIVER METASTASIS CORE BIOPSY

MEDICATIONS:
% LIDOCAINE LOCAL

ANESTHESIA/SEDATION:
Moderate (conscious) sedation was employed during this procedure. A
total of Versed 2.0 mg and Fentanyl 100 mcg was administered
intravenously.

Moderate Sedation Time: 10 minutes. The patient's level of
consciousness and vital signs were monitored continuously by
radiology nursing throughout the procedure under my direct
supervision.

FLUOROSCOPY TIME:  Fluoroscopy Time: NONE.

COMPLICATIONS:
None immediate.

PROCEDURE:
Informed written consent was obtained from the patient after a
thorough discussion of the procedural risks, benefits and
alternatives. All questions were addressed. Maximal Sterile Barrier
Technique was utilized including caps, mask, sterile gowns, sterile
gloves, sterile drape, hand hygiene and skin antiseptic. A timeout
was performed prior to the initiation of the procedure.

Previous imaging reviewed. Preliminary ultrasound performed. A left
hepatic lesion was localized in the subxiphoid region. Overlying
skin marked.

Under sterile conditions and local anesthesia, a 17 gauge 6.8 cm
access was advanced percutaneously into a left hepatic lesion.
Needle position confirmed with ultrasound. Images obtained for
documentation. 3 18 gauge core biopsies obtained under direct
ultrasound. Images obtained during the biopsies. Needle tract
occluded with Gel-Foam. Postprocedure imaging demonstrates no
hemorrhage or hematoma. Patient tolerated biopsy well.
IMPRESSION: Successful ultrasound left hepatic metastasis 18 gauge core biopsies

## 2018-11-13 MED ORDER — MIDAZOLAM HCL 2 MG/2ML IJ SOLN
INTRAMUSCULAR | Status: AC | PRN
  Administered 2018-11-13: 0.5 mg via INTRAVENOUS
  Administered 2018-11-13: 1 mg via INTRAVENOUS
  Administered 2018-11-13: 0.5 mg via INTRAVENOUS

## 2018-11-13 MED ORDER — SODIUM CHLORIDE 0.9 % IV SOLN
INTRAVENOUS | Status: AC | PRN
  Administered 2018-11-13: 10 mL/h via INTRAVENOUS

## 2018-11-13 MED ORDER — SODIUM CHLORIDE 0.9 % IV SOLN: INTRAVENOUS | Status: DC

## 2018-11-13 MED ORDER — FENTANYL CITRATE (PF) 100 MCG/2ML IJ SOLN
INTRAMUSCULAR | Status: AC | PRN
  Administered 2018-11-13: 50 ug via INTRAVENOUS
  Administered 2018-11-13 (×2): 25 ug via INTRAVENOUS

## 2018-11-13 MED ORDER — MIDAZOLAM HCL 2 MG/2ML IJ SOLN
INTRAMUSCULAR | Status: AC
  Filled 2018-11-13: qty 2

## 2018-11-13 MED ORDER — GELATIN ABSORBABLE 12-7 MM EX MISC
CUTANEOUS | Status: AC
  Filled 2018-11-13: qty 1

## 2018-11-13 MED ORDER — FENTANYL CITRATE (PF) 100 MCG/2ML IJ SOLN
INTRAMUSCULAR | Status: AC
  Filled 2018-11-13: qty 2

## 2018-11-13 MED ORDER — LIDOCAINE HCL (PF) 1 % IJ SOLN
INTRAMUSCULAR | Status: AC
  Filled 2018-11-13: qty 30

## 2018-11-13 NOTE — Sedation Documentation (Signed)
Called to give report. Nurse unavailable. Will call back 

## 2018-11-13 NOTE — Procedures (Signed)
Liver mets, suspect met carcinoid  S/p [place] LEFT LIVER MET BX  No comp Stable ebl min Path pending Full report in pacs

## 2018-11-13 NOTE — H&P (Signed)
Chief Complaint: Patient was seen in consultation today for liver lesion  Referring Physician(s): Magod,Marc  Supervising Physician: Daryll Brod  Patient Status: [name] - Out-pt  History of Present Illness: William Hancock is a 53 y.o. male with history of CAD s/p stent intervention on Brilinta, HLD, HTN who presents to [facility] Radiology today for liver biopsy.  Patient is followed by GI for chronic diarrhea. At recent follow-up US imaging was obtained and showed possible metastatic disease.   CT Abdomen Pelvis 6/2 showed: 1. Findings are highly suspicious for metastatic carcinoid tumor with multiple heterogeneous liver masses and a partially calcified central mesenteric mass. No obvious primary small bowel tumor or bowel obstruction. The hepatic lesions are much more obvious on ultrasound and CT. Biopsy under ultrasound guidance should be possible. For future follow-up, MRI or multiphasic CT recommended. 2. No other evidence of metastatic disease. 3. Nonobstructing left renal calculus. 4. Coronary and Aortic Atherosclerosis (ICD10-I70.0).  IR consulted for liver lesion biospy at there request of Dr. Watt Climes.  Patient presents in his usual state of health for procedure today.  Denies complains.  He has been NPO. He has appropriately held his Brilinta.  Past Medical History:  Diagnosis Date  . Coronary artery disease   . Hyperlipidemia   . Hypertension   . Myocardial infarction [facility])     Past Surgical History:  Procedure Laterality Date  . CARDIAC CATHETERIZATION  06/09/2018  . CORONARY ATHERECTOMY N/A 06/09/2018   Procedure: CORONARY ATHERECTOMY;  Surgeon: Nigel Mormon, MD;  Location: [place] CV LAB;  Service: Cardiovascular;  Laterality: N/A;  . CORONARY STENT INTERVENTION N/A 06/06/2018   Procedure: CORONARY STENT INTERVENTION;  Surgeon: Nigel Mormon, MD;  Location: [place] [facility] LAB;  Service: Cardiovascular;  Laterality: N/A;  . CORONARY STENT INTERVENTION   06/09/2018  . CORONARY STENT INTERVENTION N/A 06/09/2018   Procedure: CORONARY STENT INTERVENTION;  Surgeon: Nigel Mormon, MD;  Location: [place] [facility] LAB;  Service: Cardiovascular;  Laterality: N/A;  . INTRAVASCULAR ULTRASOUND/IVUS N/A 06/06/2018   Procedure: Intravascular Ultrasound/IVUS;  Surgeon: Nigel Mormon, MD;  Location: [place] [facility];  Service: Cardiovascular;  Laterality: N/A;  . INTRAVASCULAR ULTRASOUND/IVUS N/A 06/09/2018   Procedure: Intravascular Ultrasound/IVUS;  Surgeon: Nigel Mormon, MD;  Location: [place] [facility] LAB;  Service: Cardiovascular;  Laterality: N/A;  . KNEE SURGERY Left 2009  . LEFT HEART CATH AND CORONARY ANGIOGRAPHY N/A 06/06/2018   Procedure: LEFT HEART CATH AND CORONARY ANGIOGRAPHY;  Surgeon: Nigel Mormon, MD;  Location: [place] [facility] LAB;  Service: Cardiovascular;  Laterality: N/A;  . TONSILLECTOMY    . WISDOM TOOTH EXTRACTION      Allergies: Penicillins  Medications: Prior to Admission medications   Medication Sig Start Date End Date Taking? Authorizing Provider  amLODipine (NORVASC) 5 MG tablet Take 1 tablet (5 mg total) by mouth daily. 11/06/18  Yes Patwardhan, Reynold Bowen, MD  aspirin 81 MG chewable tablet Chew 1 tablet (81 mg total) by mouth daily. 11/06/18  Yes Patwardhan, Manish J, MD  atorvastatin (LIPITOR) 40 MG tablet Take 1 tablet (40 mg total) by mouth daily at 6 PM. 09/08/18  Yes Patwardhan, Manish J, MD  betamethasone dipropionate (DIPROLENE) 0.05 % cream Apply 1 application topically daily.   Yes [provider]  metoprolol succinate (TOPROL-XL) 25 MG 24 hr tablet Take 2 tablets (50 mg total) by mouth daily. Take with or immediately following a meal. Patient taking differently: Take 25 mg by mouth 2 (two) times  a day. Take with or immediately following a meal. 09/08/18  Yes Patwardhan, Manish J, MD  nitroGLYCERIN (NITROSTAT) 0.4 MG SL tablet Place 1 tablet (0.4 mg total) under the tongue every 5 (five) minutes x  3 doses as needed for chest pain. 11/06/18  Yes Patwardhan, Manish J, MD  vitamin B-12 (CYANOCOBALAMIN) 500 MCG tablet Take 1,000 mcg by mouth daily.   Yes [provider]  ticagrelor (BRILINTA) 90 MG TABS tablet Take 1 tablet (90 mg total) by mouth 2 (two) times daily. 11/06/18   Patwardhan, Reynold Bowen, MD     Family History  Problem Relation Age of Onset  . Diabetes Father   . Kidney failure Father   . Coronary artery disease Paternal Grandfather        Died in 57s  . Breast cancer Mother   . Breast cancer Sister   . Kidney disease Sister     Social History   Socioeconomic History  . Marital status: Married    Spouse name: Not on file  . Number of children: Not on file  . Years of education: Not on file  . Highest education level: Not on file  Occupational History  . Occupation: Curator  Social Needs  . Financial resource strain: Not on file  . Food insecurity    Worry: Not on file    Inability: Not on file  . Transportation needs    Medical: Not on file    Non-medical: Not on file  Tobacco Use  . Smoking status: Former Smoker    Quit date: 06/05/1998    Years since quitting: 20.4  . Smokeless tobacco: Never Used  Substance and Sexual Activity  . Alcohol use: Yes    Comment: 2 drinks/month  . Drug use: Never  . Sexual activity: Not on file  Lifestyle  . Physical activity    Days per week: Not on file    Minutes per session: Not on file  . Stress: Not on file  Relationships  . Social Herbalist on phone: Not on file    Gets together: Not on file    Attends religious service: Not on file    Active member of club or organization: Not on file    Attends meetings of clubs or organizations: Not on file    Relationship status: Not on file  Other Topics Concern  . Not on file  Social History Narrative  . Not on file     Review of Systems: A 12 point ROS discussed and pertinent positives are indicated in the HPI above.  All other  systems are negative.  Review of Systems  Constitutional: Negative for fatigue and fever.  Respiratory: Negative for cough and shortness of breath.   Cardiovascular: Negative for chest pain.  Gastrointestinal: Positive for diarrhea. Negative for abdominal pain, nausea and vomiting.  Musculoskeletal: Negative for back pain.  Psychiatric/Behavioral: Negative for behavioral problems and confusion.    Vital Signs: BP 124/90 (BP Location: Left Arm)   Pulse (!) 50   Temp 97.8 F (36.6 C) (Oral)   Resp 12   Ht 5\' 11"  (1.803 m)   Wt 220 lb (99.8 kg)   SpO2 100%   BMI 30.68 kg/m   Physical Exam Vitals signs and nursing note reviewed.  Constitutional:      Appearance: Normal appearance.  HENT:     Mouth/Throat:     Mouth: Mucous membranes are moist.     Pharynx: Oropharynx is  clear.  Cardiovascular:     Rate and Rhythm: Normal rate and regular rhythm.  Pulmonary:     Effort: Pulmonary effort is normal.     Breath sounds: Normal breath sounds.  Abdominal:     General: Abdomen is flat. There is no distension.     Palpations: Abdomen is soft.  Skin:    General: Skin is warm and dry.  Neurological:     General: No focal deficit present.     Mental Status: He is alert and oriented to person, place, and time. Mental status is at baseline.  Psychiatric:        Mood and Affect: Mood normal.        Behavior: Behavior normal.        Thought Content: Thought content normal.        Judgment: Judgment normal.      MD Evaluation Airway: WNL Heart: WNL Abdomen: WNL Chest/ Lungs: WNL ASA  Classification: 3 Mallampati/Airway Score: One   Imaging: US Abdomen Complete  Result Date: 10/31/2018 CLINICAL DATA:  Abnormal AST and ALT EXAM: ABDOMEN ULTRASOUND COMPLETE COMPARISON:  None. FINDINGS: Gallbladder: No gallstones or wall thickening visualized. No sonographic Murphy sign noted by sonographer. Common bile duct: Diameter: 3 mm Liver: Heterogeneous liver due to numerous nearly  confluent heterogeneous masses which measure up to 3.5 cm where discretely visible. Portal vein is patent on color Doppler imaging with normal direction of blood flow towards the liver. IVC: No abnormality visualized. Pancreas: Obscured by bowel gas Spleen: Size and appearance within normal limits. Right Kidney: Length: 11.7 cm. Echogenicity within normal limits. No mass or hydronephrosis visualized. Left Kidney: Length: 11.4 cm. Echogenicity within normal limits. No mass or hydronephrosis visualized. Abdominal aorta: No aneurysm visualized. Other: These results will be called to the ordering clinician or representative by the Radiologist Assistant, and communication documented in the PACS or zVision Dashboard. IMPRESSION: Innumerable liver masses, most likely metastatic disease. Recommend enhanced CT. Electronically Signed   By: Monte Fantasia M.D.   On: 10/31/2018 08:33   Ct Abdomen Pelvis W Contrast  Result Date: 11/04/2018 CLINICAL DATA:  Abnormal ultrasound with multiple liver masses. Evaluate for metastatic disease. Elevated liver function studies. Creatinine was obtained on site at [place] at [address] Results: Creatinine 1.8 mg/dL. EXAM: CT ABDOMEN AND PELVIS WITH CONTRAST TECHNIQUE: Multidetector CT imaging of the abdomen and pelvis was performed using the standard protocol following bolus administration of intravenous contrast. CONTRAST:  4mL ISOVUE-300 IOPAMIDOL (ISOVUE-300) INJECTION 61% COMPARISON:  Abdominal ultrasound 10/31/2018. FINDINGS: Lower chest: Clear lung bases. No significant pleural or pericardial effusion. There are prominent coronary artery calcifications and mild wall thickening of the distal esophagus. Hepatobiliary: The liver is very heterogeneous in density with suspicion of multiple nearly isodense enhancing hepatic lesions. There is a lesion inferiorly in the right lobe measuring up to 3.4 cm on image 36/2. There are lower density components more superiorly  in the right lobe measuring 14 mm on image 22/2 and 17 mm on image 26/2. These lesions are much more obvious on ultrasound than CT. No evidence of gallstones, gallbladder wall thickening or biliary dilatation. Pancreas: Unremarkable. No pancreatic ductal dilatation or surrounding inflammatory changes. Spleen: Normal in size without focal abnormality. Adrenals/Urinary Tract: Both adrenal glands appear normal. There is a small nonobstructing calculus centrally in the left kidney. No evidence of ureteral calculus or hydronephrosis. There is no renal mass. The bladder appears normal. Stomach/Bowel: No evidence of bowel wall thickening, distention or  surrounding inflammatory change. The appendix appears normal. No obvious mucosal lesion of the small or large bowel. There is a short segment of distal small bowel in the right lower quadrant which is not opacified with contrast, but shows no definite mass lesion or upstream dilatation. Vascular/Lymphatic: There is a central partially calcified spiculated mesenteric mass measuring 3.4 x 3.1 cm on image 56/2, highly suspicious for nodal disease from carcinoid tumor. This may involve and partially obstruct the superior mesenteric vein peripheral branches. No other mesenteric masses or enlarged abdominal lymph nodes are identified. The portal vein and central portions of the superior mesenteric and splenic veins are patent. Mild aortic and branch vessel atherosclerosis. Reproductive: Mild enlargement of the prostate gland. The seminal vesicles appear normal. Left scrotal vasectomy clip noted. Other: No ascites or peritoneal nodularity. Musculoskeletal: No acute or significant osseous findings. IMPRESSION: 1. Findings are highly suspicious for metastatic carcinoid tumor with multiple heterogeneous liver masses and a partially calcified central mesenteric mass. No obvious primary small bowel tumor or bowel obstruction. The hepatic lesions are much more obvious on ultrasound and  CT. Biopsy under ultrasound guidance should be possible. For future follow-up, MRI or multiphasic CT recommended. 2. No other evidence of metastatic disease. 3. Nonobstructing left renal calculus. 4. Coronary and Aortic Atherosclerosis (ICD10-I70.0). Electronically Signed   By: Richardean Sale M.D.   On: 11/04/2018 17:14    Labs:  CBC: Recent Labs    06/07/18 0445 06/08/18 0436 06/09/18 0722 06/09/18 0804 11/13/18 0630  WBC 6.2 7.3  --  6.6 7.8  HGB 13.9 14.4 14.3 14.0 14.7  HCT 40.8 42.0 42.0 40.7 43.2  PLT 196 201  --  187 154    COAGS: Recent Labs    06/05/18 1700 11/13/18 0630  INR 0.84 1.0  APTT 35  --     BMP: Recent Labs    06/09/18 0804 06/10/18 0539 09/01/18 0810 11/13/18 0630  NA 136 136 143 141  K 3.9 3.9 5.0 4.2  CL 110 107 105 108  CO2 20* 20* 23 25  GLUCOSE 87 100* 80 88  BUN 13 10 17 18   CALCIUM 9.0 9.1 9.4 9.3  CREATININE 1.41* 1.46* 1.70* 1.67*  GFRNONAA 56* 54* 44* 45*  GFRAA >60 >60 51* 53*    LIVER FUNCTION TESTS: Recent Labs    06/05/18 1553 06/09/18 0804 09/01/18 0810 11/13/18 0630  BILITOT 0.7 1.4* 0.9 0.8  AST 62* 44* 96* 78*  ALT 48* 40 87* 64*  ALKPHOS 44 45 83 49  PROT 8.3* 7.1 7.5 6.9  ALBUMIN 4.7 3.7 4.7 4.2    TUMOR MARKERS: No results for input(s): AFPTM, CEA, CA199, CHROMGRNA in the last 8760 hours.  Assessment and Plan: Patient with past medical history of MI, HTN, CAD s/p catheterization with stents presents with complaint of newly found metastatic disease.  IR consulted for liver lesion biopsy at the request of Dr. Watt Climes. Case reviewed by Dr. Annamaria Boots who approves patient for procedure.  Patient presents today in their usual state of health.  He has been NPO and is not currently on blood thinners as he has held his Brilinta since Saturday.    Risks and benefits of biopsy was discussed with the patient and/or patient's family including, but not limited to bleeding, infection, damage to adjacent structures or low  yield requiring additional tests.  All of the questions were answered and there is agreement to proceed.  Consent signed and in chart.  Thank you for this interesting consult.  I greatly enjoyed meeting William Hancock and look forward to participating in their care.  A copy of this report was sent to the requesting provider on this date.  Electronically Signed: Docia Barrier, PA 11/13/2018, 8:13 AM   I spent a total of  30 Minutes   in face to face in clinical consultation, greater than 50% of which was counseling/coordinating care for liver lesion.

## 2018-11-13 NOTE — Discharge Instructions (Signed)
Liver Biopsy ° °The liver is a large organ in the upper right side of the abdomen. A liver biopsy is a procedure in which a tissue sample is taken from the liver and examined under a microscope. °There are three types of liver biopsies: °· Percutaneous. A needle is used to remove a sample through an incision in your abdomen. °· Laparoscopic. Several incisions are made in the abdomen. A sample is removed with the help of a tiny camera. °· Transjugular. An incision is made in your neck in the area of the jugular vein. A sample is removed through a small flexible tube that is passed down the blood vessel and into your liver. °Tell a health care provider about: °· Any allergies you have. °· All medicines you are taking, including vitamins, herbs, eye drops, creams, and over-the-counter medicines. °· Any problems you or family members have had with anesthetic medicines. °· Any blood disorders you have. °· Any surgeries you have had. °· Any medical conditions you have. °· Whether you are pregnant or may be pregnant. °What are the risks? °Generally, this is a safe procedure. However, problems can occur and include: °· Bleeding. °· Infection. °· Bruising. °· Pain. °· Injury to nearby organs or tissues, such as nerves, gallbladder, liver, or lungs. °What happens before the procedure? °Eating and drinking restrictions °· You may be asked not to drink or eat for 6-8 hours before the liver biopsy. You may be allowed to eat a light breakfast. Talk to your health care provider about when you should stop eating and drinking. °Medicines °Ask your health care provider about: °· Changing or stopping your regular medicines. This is especially important if you are taking diabetes medicines or blood thinners. °· Taking medicines such as aspirin and ibuprofen. These medicines can thin your blood. Do not take these medicines unless your health care provider tells you to take them. °· Taking over-the-counter medicines, vitamins, herbs, and  supplements. °General instructions °· Do not use any products that contain nicotine or tobacco, such as cigarettes and e-cigarettes. If you need help quitting, ask your health care provider. °· Plan to have someone take you home from the hospital or clinic. °· Plan to have a responsible adult care for you for at least 24 hours after you leave the hospital or clinic. This is important. °· You may have blood or urine tests. °· Ask your health care provider what steps will be taken to prevent infection. These may include: °? Removing hair at the surgery site. °? Washing skin with a germ-killing soap. °? Taking antibiotic medicine. °What happens during the procedure? °· An IV will be inserted into one of your veins. °? You will be given one or more of the following: °? A medicine to help you relax (sedative). °? A medicine to numb the area (local anesthetic). °? A medicine to make you fall asleep (general anesthetic). °· Your health care provider will use one of the following procedures to remove samples from your liver. These procedures may vary among health care providers and hospitals. °Percutaneous liver biopsy °· You will lie on your back, with your right hand over your head. °· A health care provider will locate your liver by tapping and pressing on the right side of your abdomen, or by using an ultrasound or CT scan. °· A local anesthetic will be used to numb an area at the bottom of your last right rib. °· A small incision will be made in the numbed area. °·   A biopsy needle will be inserted into the incision.  Several samples of liver tissue will be taken. You will be asked to hold your breath as each sample is taken.  The incision will be closed with stitches (sutures).  A bandage (dressing) may be placed over the incision. Laparoscopic liver biopsy  You will lie on your back.  Several small incisions will be made in your abdomen.  Your health care provider will pass a tiny camera through one  incision. The camera will allow the liver to be viewed on a TV monitor in the operating room.  Tools will be passed through the other incision or incisions.  Samples of the liver will be removed using the tools.  The incisions will be closed with stitches (sutures).  A bandage (dressing) may be placed over the incisions. Transjugular liver biopsy  You will lie on your back on an X-ray table, with your head turned to your left.  An area on your neck, just over your jugular vein, will be numbed.  An incision will be made in the numbed area.  A tiny tube will be inserted through the incision. The tube will be passed into the jugular vein to a blood vessel in the liver called the hepatic vein.  A dye will be injected through the tube.  X-rays will be taken. The dye will make the blood vessels in the liver light up on the X-rays.  The biopsy needle will be placed through the tube until it reaches the liver.  Samples of liver tissue will be taken with the biopsy needle.  The needle and the tube will be removed.  The incision will be closed with stitches (sutures).  A bandage (dressing) may be placed over the incision. What happens after the procedure?  Your blood pressure, heart rate, breathing rate, and blood oxygen level will be monitored until you leave the hospital or clinic.  You will be asked to rest quietly for 2-4 hours or longer.  You will be closely monitored for bleeding from the biopsy site.  You may be allowed to go home when the medicines have worn off and you can walk, drink, eat, and use the bathroom. Summary  A liver biopsy is a procedure in which a tissue sample is taken from the liver and examined under a microscope.  This is a safe procedure, but problems can occur, including bleeding, infection, pain, or injury to nearby organs or tissues.  Ask your health care provider about changing or stopping your regular medicines.  Plan to have someone take you  home from the hospital or clinic and to be with you for 24 hours after the procedure. This information is not intended to replace advice given to you by your health care provider. Make sure you discuss any questions you have with your health care provider. Document Released: 08/11/2003 Document Revised: 05/31/2017 Document Reviewed: 05/31/2017 Elsevier Interactive Patient Education  2019 [place]. Moderate Conscious Sedation, Adult, Care After These instructions provide you with information about caring for yourself after your procedure. Your health care provider may also give you more specific instructions. Your treatment has been planned according to current medical practices, but problems sometimes occur. Call your health care provider if you have any problems or questions after your procedure. What can I expect after the procedure? After your procedure, it is common:  To feel sleepy for several hours.  To feel clumsy and have poor balance for several hours.  To have poor judgment  for several hours.  To vomit if you eat too soon. Follow these instructions at home: For at least 24 hours after the procedure:   Do not: ? Participate in activities where you could fall or become injured. ? Drive. ? Use heavy machinery. ? Drink alcohol. ? Take sleeping pills or medicines that cause drowsiness. ? Make important decisions or sign legal documents. ? Take care of children on your own.  Rest. Eating and drinking  Follow the diet recommended by your health care provider.  If you vomit: ? Drink water, juice, or soup when you can drink without vomiting. ? Make sure you have little or no nausea before eating solid foods. General instructions  Have a responsible adult stay with you until you are awake and alert.  Take over-the-counter and prescription medicines only as told by your health care provider.  If you smoke, do not smoke without supervision.  Keep all follow-up visits  as told by your health care provider. This is important. Contact a health care provider if:  You keep feeling nauseous or you keep vomiting.  You feel light-headed.  You develop a rash.  You have a fever. Get help right away if:  You have trouble breathing. This information is not intended to replace advice given to you by your health care provider. Make sure you discuss any questions you have with your health care provider. Document Released: 03/11/2013 Document Revised: 10/24/2015 Document Reviewed: 09/10/2015 Elsevier Interactive Patient Education  2019 [employer].

## 2018-11-17 ENCOUNTER — Telehealth (HOSPITAL_COMMUNITY): Payer: Self-pay | Admitting: *Deleted

## 2018-11-17 NOTE — Telephone Encounter (Signed)
Pt has not responded to message left on 11/04/18.  Pt sent please contact letter for virtual cardiac rehab.  If no response by 12/01/18 will close this referral. Maurice Small RN, BSN Cardiac and Pulmonary Rehab Nurse Navigator

## 2018-11-18 ENCOUNTER — Telehealth: Payer: Self-pay | Admitting: Nurse Practitioner

## 2018-11-18 ENCOUNTER — Telehealth: Payer: Self-pay

## 2018-11-18 NOTE — Telephone Encounter (Signed)
Called patient to introduce myself and the role of GI navigator. Patient advised that we had received a referral from Dr. 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 I explained that  we would be contacting him shortly with T/D/L of apt with Dr. Benay Spice and he noted that his schedule is quite flexible and would like to be see as soon as possible. I provided my direct phone number for questions or concerns.

## 2018-11-18 NOTE — Telephone Encounter (Signed)
Received a new patient appt from Dr. Watt Climes for metastatic neuroendocrine tumor. Pt has been cld and scheduled to see Ned Card, NP on 6/30 at 9:15am. Aware to arrive 15-30 minutes early. Pt voiced understanding.

## 2018-12-02 ENCOUNTER — Inpatient Hospital Stay: Payer: Federal, State, Local not specified - PPO

## 2018-12-02 ENCOUNTER — Inpatient Hospital Stay: Payer: Federal, State, Local not specified - PPO | Attending: Nurse Practitioner | Admitting: Nurse Practitioner

## 2018-12-02 ENCOUNTER — Other Ambulatory Visit: Payer: Self-pay

## 2018-12-02 ENCOUNTER — Encounter: Payer: Self-pay | Admitting: Nurse Practitioner

## 2018-12-02 VITALS — BP 120/77 | HR 64 | Temp 98.9°F | Resp 18 | Ht 71.0 in | Wt 221.9 lb

## 2018-12-02 DIAGNOSIS — C7A00 Malignant carcinoid tumor of unspecified site: Secondary | ICD-10-CM | POA: Insufficient documentation

## 2018-12-02 DIAGNOSIS — C7A8 Other malignant neuroendocrine tumors: Secondary | ICD-10-CM | POA: Diagnosis not present

## 2018-12-02 DIAGNOSIS — C7B02 Secondary carcinoid tumors of liver: Secondary | ICD-10-CM

## 2018-12-02 DIAGNOSIS — I880 Nonspecific mesenteric lymphadenitis: Secondary | ICD-10-CM | POA: Diagnosis not present

## 2018-12-02 NOTE — Addendum Note (Signed)
Encounter addended by: Noel Christmas, RN on: 12/02/2018 4:33 PM  Actions taken: Clinical Note Signed

## 2018-12-02 NOTE — Progress Notes (Signed)
New Hematology/Oncology Consult   Requesting MD: Dr. Watt Climes  [phone]      Reason for Consult: Metastatic neuroendocrine tumor  HPI: William Hancock is a 53 year old man found to have multiple liver lesions while undergoing evaluation of LFT abnormalities.  Abdominal ultrasound on 10/31/2018 showed numerous nearly confluent heterogeneous masses measuring up to 3.5 cm.  CT abdomen/pelvis on 11/04/2018 showed multiple enhancing hepatic lesions and a central partially calcified spiculated mesenteric mass measuring 3.1 x 3.1 cm.  He underwent biopsy of a liver lesion on 11/13/2018 with pathology showing metastatic well-differentiated neuroendocrine tumor positive for CKAE1-AE3, synaptophysin, chromogranin, CD56 and CDX-2; Ki-67 labeling index less than 3%; TTF-1 negative.     Past Medical History:  Diagnosis Date  . Coronary artery disease   . Hyperlipidemia   . Hypertension   . Myocardial infarction [facility])   :   Past Surgical History:  Procedure Laterality Date  . CARDIAC CATHETERIZATION  06/09/2018  . CORONARY ATHERECTOMY N/A 06/09/2018   Procedure: CORONARY ATHERECTOMY;  Surgeon: Nigel Mormon, MD;  Location: [place] [facility];  Service: Cardiovascular;  Laterality: N/A;  . CORONARY STENT INTERVENTION N/A 06/06/2018   Procedure: CORONARY STENT INTERVENTION;  Surgeon: Nigel Mormon, MD;  Location: [place] [facility];  Service: Cardiovascular;  Laterality: N/A;  . CORONARY STENT INTERVENTION  06/09/2018  . CORONARY STENT INTERVENTION N/A 06/09/2018   Procedure: CORONARY STENT INTERVENTION;  Surgeon: Nigel Mormon, MD;  Location: [place] [facility];  Service: Cardiovascular;  Laterality: N/A;  . INTRAVASCULAR ULTRASOUND/IVUS N/A 06/06/2018   Procedure: Intravascular Ultrasound/IVUS;  Surgeon: Nigel Mormon, MD;  Location: [place] [facility];  Service: Cardiovascular;  Laterality: N/A;  . INTRAVASCULAR ULTRASOUND/IVUS N/A 06/09/2018   Procedure: Intravascular  Ultrasound/IVUS;  Surgeon: Nigel Mormon, MD;  Location: [place] [facility];  Service: Cardiovascular;  Laterality: N/A;  . KNEE SURGERY Left 2009  . LEFT HEART CATH AND CORONARY ANGIOGRAPHY N/A 06/06/2018   Procedure: LEFT HEART CATH AND CORONARY ANGIOGRAPHY;  Surgeon: Nigel Mormon, MD;  Location: [place] [facility];  Service: Cardiovascular;  Laterality: N/A;  . TONSILLECTOMY    . WISDOM TOOTH EXTRACTION    :   Current Outpatient Medications:  .  amLODipine (NORVASC) 5 MG tablet, Take 1 tablet (5 mg total) by mouth daily., Disp: 90 tablet, Rfl: 0 .  aspirin 81 MG chewable tablet, Chew 1 tablet (81 mg total) by mouth daily., Disp: 90 tablet, Rfl: 0 .  atorvastatin (LIPITOR) 40 MG tablet, Take 1 tablet (40 mg total) by mouth daily at 6 PM., Disp: 60 tablet, Rfl: 3 .  betamethasone dipropionate (DIPROLENE) 0.05 % cream, Apply 1 application topically daily., Disp: , Rfl:  .  metoprolol succinate (TOPROL-XL) 25 MG 24 hr tablet, Take 2 tablets (50 mg total) by mouth daily. Take with or immediately following a meal. (Patient taking differently: Take 25 mg by mouth 2 (two) times a day. Take with or immediately following a meal.), Disp: 60 tablet, Rfl: 3 .  ticagrelor (BRILINTA) 90 MG TABS tablet, Take 1 tablet (90 mg total) by mouth 2 (two) times daily., Disp: 60 tablet, Rfl: 3 .  vitamin B-12 (CYANOCOBALAMIN) 500 MCG tablet, Take 1,000 mcg by mouth daily., Disp: , Rfl:  .  nitroGLYCERIN (NITROSTAT) 0.4 MG SL tablet, Place 1 tablet (0.4 mg total) under the tongue every 5 (five) minutes x 3 doses as needed for chest pain. (Patient not taking: Reported on 12/02/2018), Disp: 25 tablet, Rfl: 1:  :  Allergies  Allergen Reactions  . Penicillins Other (See Comments)    DID THE REACTION INVOLVE: Swelling of the face/tongue/throat, SOB, or low BP? N Sudden or severe rash/hives, skin peeling, or the inside of the mouth or nose? N Did it require medical treatment? N When did it last  happen?childhood If all above answers are "NO", may proceed with cephalosporin use.   :  FH: Mother with breast cancer; father deceased with kidney problems at age 71; sister with breast cancer; half-sister with congenital absence of a kidney  SOCIAL HISTORY: He lives in [place].  He is married.  He has 2 children.  He is employed in [employer], production.  Quit smoking in 2000 at 1 pack/day for 20 years.  Estimates EtOH intake at 3 drinks per month.  He has never had a blood transfusion.  Review of Systems: He denies fever and sweats.  No flushing episodes.  No anorexia or weight loss.  He denies pain.  No unusual headaches.  No vision change.  No cough or shortness of breath.  No chest pain.  No leg swelling or calf pain.  About 18 months ago he developed diarrhea after taking an antibiotic.  He was prescribed something that started with the letter "M" in December.  He took this for 10 days and noted significant improvement.  He continues to have approximately 1 loose stool a day.  He wonders if this is connected to his diet.  No urinary symptoms.  No numbness or tingling in the hands or feet.  He reports a long history of the creatinine being elevated.  Physical Exam:  Blood pressure 120/77, pulse 64, temperature 98.9 F (37.2 C), temperature source Oral, resp. rate 18, height _0  (1.803 m), weight 221 lb 14.4 oz (100.7 kg), SpO2 97 %.  HEENT: No thrush or ulcers.  Neck without mass. Abdomen: Abdomen soft and nontender.  No hepatomegaly.  No splenomegaly.  No apparent ascites. Vascular: No leg edema. Lymph nodes: No palpable cervical, supraclavicular, axillary or inguinal lymph nodes. Neurologic: Alert and oriented.  Follows commands. Skin: No rash.  LABS:  RADIOLOGY:  Ct Abdomen Pelvis W Contrast  Result Date: 11/04/2018 CLINICAL DATA:  Abnormal ultrasound with multiple liver masses. Evaluate for metastatic disease. Elevated liver function studies. Creatinine was  obtained on site at [facility] at [address] Results: Creatinine 1.8 mg/dL. EXAM: CT ABDOMEN AND PELVIS WITH CONTRAST TECHNIQUE: Multidetector CT imaging of the abdomen and pelvis was performed using the standard protocol following bolus administration of intravenous contrast. CONTRAST:  4m ISOVUE-300 IOPAMIDOL (ISOVUE-300) INJECTION 61% COMPARISON:  Abdominal ultrasound 10/31/2018. FINDINGS: Lower chest: Clear lung bases. No significant pleural or pericardial effusion. There are prominent coronary artery calcifications and mild wall thickening of the distal esophagus. Hepatobiliary: The liver is very heterogeneous in density with suspicion of multiple nearly isodense enhancing hepatic lesions. There is a lesion inferiorly in the right lobe measuring up to 3.4 cm on image 36/2. There are lower density components more superiorly in the right lobe measuring 14 mm on image 22/2 and 17 mm on image 26/2. These lesions are much more obvious on ultrasound than CT. No evidence of gallstones, gallbladder wall thickening or biliary dilatation. Pancreas: Unremarkable. No pancreatic ductal dilatation or surrounding inflammatory changes. Spleen: Normal in size without focal abnormality. Adrenals/Urinary Tract: Both adrenal glands appear normal. There is a small nonobstructing calculus centrally in the left kidney. No evidence of ureteral calculus or hydronephrosis. There is no renal mass.  The bladder appears normal. Stomach/Bowel: No evidence of bowel wall thickening, distention or surrounding inflammatory change. The appendix appears normal. No obvious mucosal lesion of the small or large bowel. There is a short segment of distal small bowel in the right lower quadrant which is not opacified with contrast, but shows no definite mass lesion or upstream dilatation. Vascular/Lymphatic: There is a central partially calcified spiculated mesenteric mass measuring 3.4 x 3.1 cm on image 56/2, highly suspicious for  nodal disease from carcinoid tumor. This may involve and partially obstruct the superior mesenteric vein peripheral branches. No other mesenteric masses or enlarged abdominal lymph nodes are identified. The portal vein and central portions of the superior mesenteric and splenic veins are patent. Mild aortic and branch vessel atherosclerosis. Reproductive: Mild enlargement of the prostate gland. The seminal vesicles appear normal. Left scrotal vasectomy clip noted. Other: No ascites or peritoneal nodularity. Musculoskeletal: No acute or significant osseous findings. IMPRESSION: 1. Findings are highly suspicious for metastatic carcinoid tumor with multiple heterogeneous liver masses and a partially calcified central mesenteric mass. No obvious primary small bowel tumor or bowel obstruction. The hepatic lesions are much more obvious on ultrasound and CT. Biopsy under ultrasound guidance should be possible. For future follow-up, MRI or multiphasic CT recommended. 2. No other evidence of metastatic disease. 3. Nonobstructing left renal calculus. 4. Coronary and Aortic Atherosclerosis (ICD10-I70.0). Electronically Signed   By: Richardean Sale M.D.   On: 11/04/2018 17:14   US Biopsy (liver)  Result Date: 11/13/2018 INDICATION: Liver metastases, suspect metastatic carcinoid EXAM: ULTRASOUND LEFT LIVER METASTASIS CORE BIOPSY MEDICATIONS: % LIDOCAINE LOCAL ANESTHESIA/SEDATION: Moderate (conscious) sedation was employed during this procedure. A total of Versed 2.0 mg and Fentanyl 100 mcg was administered intravenously. Moderate Sedation Time: 10 minutes. The patient's level of consciousness and vital signs were monitored continuously by radiology nursing throughout the procedure under my direct supervision. FLUOROSCOPY TIME:  Fluoroscopy Time: NONE. COMPLICATIONS: None immediate. PROCEDURE: Informed written consent was obtained from the patient after a thorough discussion of the procedural risks, benefits and alternatives.  All questions were addressed. Maximal Sterile Barrier Technique was utilized including caps, mask, sterile gowns, sterile gloves, sterile drape, hand hygiene and skin antiseptic. A timeout was performed prior to the initiation of the procedure. Previous imaging reviewed. Preliminary ultrasound performed. A left hepatic lesion was localized in the subxiphoid region. Overlying skin marked. Under sterile conditions and local anesthesia, a 17 gauge 6.8 cm access was advanced percutaneously into a left hepatic lesion. Needle position confirmed with ultrasound. Images obtained for documentation. 3 18 gauge core biopsies obtained under direct ultrasound. Images obtained during the biopsies. Needle tract occluded with Gel-Foam. Postprocedure imaging demonstrates no hemorrhage or hematoma. Patient tolerated biopsy well. IMPRESSION: Successful ultrasound left hepatic metastasis 18 gauge core biopsies Electronically Signed   By: Jerilynn Mages.  Shick M.D.   On: 11/13/2018 09:37    Assessment and Plan:   1. Metastatic neuroendocrine tumor, well-differentiated  10/31/2018 Abdominal ultrasound-numerous nearly confluent heterogeneous masses measuring up to 3.5 cm.    11/04/2018 CT abdomen/pelvis-multiple enhancing hepatic lesions and a central partially calcified spiculated mesenteric mass measuring 3.1 x 3.1 cm.    11/13/2018 biopsy liver lesion-metastatic well-differentiated neuroendocrine tumor positive for CKAE1-AE3, synaptophysin, chromogranin, CD56 and CDX-2; Ki-67 labeling index less than 3%; TTF-1 negative 2. History of MI/stent placement 3. CAD 4. Hypertension 5. Renal insufficiency   William Hancock has been diagnosed with metastatic carcinoid tumor involving the liver.  Dr. Benay Spice reviewed the diagnosis, prognosis and treatment options  with him at today's visit.  His wife was on the phone.  He appears asymptomatic.  He will return to the lab today for a chromogranin A level.  We are referring him for a staging Netspot  study.  Dr. Benay Spice had preliminary discussion with him regarding observation, Sandostatin, Lutathera.  William Hancock has an appointment with Dr. Leamon Arnt in approximately 2 weeks.  He will return for a follow-up visit here on 12/17/2018 for additional discussion.  He will contact the office in the interim with any problems.  Patient seen with Dr. Benay Spice.   Ned Card, NP 12/02/2018, 10:01 AM   This was a shared visit with Ned Card.  William Hancock was interviewed and examined.  He has been diagnosed with metastatic carcinoid tumor involving the liver.  He most likely has a small bowel primary.  He will be referred for a staging Netspot.  I discussed the diagnosis and prognosis with William Hancock.  His wife was present for today's visit by telephone.  We reviewed the CT abdomen images.  He understands no therapy will be curative.  We discussed treatment options including observation, a trial of a somatostatin analog, and Lutathera.  He has arranged for a second opinion appointment with Dr. Leamon Arnt.  I will see him a few days for this appointment.  He appears asymptomatic from the carcinoid disease at present. Julieanne Manson, MD

## 2018-12-02 NOTE — Progress Notes (Signed)
Discharge Progress Report  Patient Details  Name: William Hancock MRN: [id number] Date of Birth: April 18, 1966 Referring Provider:     CARDIAC REHAB PHASE II ORIENTATION from 08/05/2018 in [facility]  Referring Provider  Dr. Virgina Jock       Number of Visits: 4  Reason for Discharge:  Early Exit:  Lack of attendance  Smoking History:  Social History   Tobacco Use  Smoking Status Former Smoker  . Quit date: 06/05/1998  . 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 Virgina Jock    Expected Discharge Date  11/10/18      Treadmill   MPH  4    Grade  0    Minutes  10      Bike   Level  1.6    Minutes  10    METs  4.02      Rower   Level  3    Watts  60    Minutes  10      Prescription Details   Frequency (times per week)  3    Duration  Progress to 30 minutes of continuous aerobic without signs/symptoms of physical distress      Intensity   THRR 40-80% of Max Heartrate  66-132    Ratings of Perceived Exertion  11-13      Progression   Progression  Continue to progress workloads to maintain intensity without signs/symptoms of physical distress.      Resistance Training   Training Prescription  Yes    Weight  6 lbs.     Reps  10-15       Discharge Exercise Prescription (Final Exercise Prescription Changes): Exercise Prescription Changes - 08/15/18 1409      Response to Exercise   Blood Pressure (Admit)  122/78    Blood Pressure (Exercise)  130/70    Blood Pressure (Exit)  104/60    Heart Rate (Admit)  78 bpm    Heart Rate (Exercise)  110 bpm    Heart Rate (Exit)  78 bpm    Rating of Perceived Exertion (Exercise)  12    Perceived Dyspnea  (Exercise)  0    Symptoms  None    Comments  None    Duration  Progress to 30 minutes of  aerobic without signs/symptoms of physical distress    Intensity  THRR unchanged      Progression   Progression  Continue to progress workloads to maintain intensity without signs/symptoms of physical distress.    Average METs  4.88      Resistance Training   Training Prescription  No    Weight  --    Reps  --    Time  --      Treadmill   MPH  3.8    Grade  0    Minutes  15    METs  4.06      Bike   Level  --    Minutes  --    METs  --      Rower   Level  4    Watts  60    Minutes  15  METs  5.7       Functional Capacity: 6 Minute Walk    Row Name 08/05/18 0743         6 Minute Walk   Phase  Initial     Distance  1851 feet     Walk Time  6 minutes     # of Rest Breaks  0     MPH  3.51     METS  4.31     RPE  11     Perceived Dyspnea   0     VO2 Peak  15.07     Symptoms  No     Resting HR  79 bpm     Resting BP  120/70     Resting Oxygen Saturation   98 %     Exercise Oxygen Saturation  during 6 min walk  98 %     Max Ex. HR  80 bpm     Max Ex. BP  128/64     2 Minute Post BP  130/80        Psychological, QOL, Others - Outcomes: PHQ 2/9: No flowsheet data found.  Quality of Life: Quality of Life - 08/05/18 0837      Quality of Life   Select  Quality of Life      Quality of Life Scores   Health/Function Pre  25.6 %    Socioeconomic Pre  28.29 %    Psych/Spiritual Pre  29.14 %    Family Pre  28.8 %    GLOBAL Pre  27.35 %       Personal Goals: Goals established at orientation with interventions provided to work toward goal. Personal Goals and Risk Factors at Admission - 08/05/18 0843      Core Components/Risk Factors/Patient Goals on Admission    Weight Management  Yes;Obesity    Intervention  Weight Management: Develop a combined nutrition and exercise program designed to reach desired caloric intake, while maintaining appropriate intake of  nutrient and fiber, sodium and fats, and appropriate energy expenditure required for the weight goal.;Weight Management: Provide education and appropriate resources to help participant work on and attain dietary goals.;Weight Management/Obesity: Establish reasonable short term and long term weight goals.;Obesity: Provide education and appropriate resources to help participant work on and attain dietary goals.    Admit Weight  222 lb 0.1 oz (100.7 kg)    Expected Outcomes  Short Term: Continue to assess and modify interventions until short term weight is achieved;Long Term: Adherence to nutrition and physical activity/exercise program aimed toward attainment of established weight goal;Weight Loss: Understanding of general recommendations for a balanced deficit meal plan, which promotes 1-2 lb weight loss per week and includes a negative energy balance of [phone] kcal/d;Understanding recommendations for meals to include 15-35% energy as protein, 25-35% energy from fat, 35-60% energy from carbohydrates, less than 200mg  of dietary cholesterol, 20-35 gm of total fiber daily;Understanding of distribution of calorie intake throughout the day with the consumption of 4-5 meals/snacks    Hypertension  Yes    Intervention  Provide education on lifestyle modifcations including regular physical activity/exercise, weight management, moderate sodium restriction and increased consumption of fresh fruit, vegetables, and low fat dairy, alcohol moderation, and smoking cessation.;Monitor prescription use compliance.    Expected Outcomes  Short Term: Continued assessment and intervention until BP is < 140/42mm HG in hypertensive participants. < 130/19mm HG in hypertensive participants with diabetes, heart failure or chronic kidney disease.;Long Term: Maintenance of blood  pressure at goal levels.    Lipids  Yes    Intervention  Provide education and support for participant on nutrition & aerobic/resistive exercise along with  prescribed medications to achieve LDL 70mg , HDL >40mg .    Expected Outcomes  Short Term: Participant states understanding of desired cholesterol values and is compliant with medications prescribed. Participant is following exercise prescription and nutrition guidelines.;Long Term: Cholesterol controlled with medications as prescribed, with individualized exercise RX and with personalized nutrition plan. Value goals: LDL < 70mg , HDL > 40 mg.        Personal Goals Discharge: Goals and Risk Factor Review    Row Name 08/20/18 1403             Core Components/Risk Factors/Patient Goals Review   Personal Goals Review  Weight Management/Obesity;Lipids;Hypertension       Review  Pt with multiple CAD RFs willing to participate in CR. Pt would like to get back to weights and live beyond 80 years. Pt continues to tolerate exercise well with his recent start to CR.  Exercise currently on hold as department is closed per recommended guidelines from federal government to prevent spread of COVID-19.        Expected Outcomes  Pt will continue to participate in CR exercise, nutrition, and lifestyle modification opportunities.           Exercise Goals and Review: Exercise Goals    Row Name 08/05/18 0844             Exercise Goals   Increase Physical Activity  Yes       Intervention  Provide advice, education, support and counseling about physical activity/exercise needs.;Develop an individualized exercise prescription for aerobic and resistive training based on initial evaluation findings, risk stratification, comorbidities and participant's personal goals.       Expected Outcomes  Short Term: Attend rehab on a regular basis to increase amount of physical activity.;Long Term: Add in home exercise to make exercise part of routine and to increase amount of physical activity.;Long Term: Exercising regularly at least 3-5 days a week.       Increase Strength and Stamina  Yes       Intervention  Provide  advice, education, support and counseling about physical activity/exercise needs.;Develop an individualized exercise prescription for aerobic and resistive training based on initial evaluation findings, risk stratification, comorbidities and participant's personal goals.       Expected Outcomes  Short Term: Increase workloads from initial exercise prescription for resistance, speed, and METs.;Long Term: Improve cardiorespiratory fitness, muscular endurance and strength as measured by increased METs and functional capacity (6MWT)       Able to understand and use rate of perceived exertion (RPE) scale  Yes       Intervention  Provide education and explanation on how to use RPE scale       Expected Outcomes  Short Term: Able to use RPE daily in rehab to express subjective intensity level;Long Term:  Able to use RPE to guide intensity level when exercising independently       Knowledge and understanding of Target Heart Rate Range (THRR)  Yes       Intervention  Provide education and explanation of THRR including how the numbers were predicted and where they are located for reference       Expected Outcomes  Short Term: Able to state/look up THRR;Long Term: Able to use THRR to govern intensity when exercising independently;Short Term: Able to use daily as guideline for  intensity in rehab       Able to check pulse independently  Yes       Intervention  Provide education and demonstration on how to check pulse in carotid and radial arteries.;Review the importance of being able to check your own pulse for safety during independent exercise       Expected Outcomes  Short Term: Able to explain why pulse checking is important during independent exercise;Long Term: Able to check pulse independently and accurately       Understanding of Exercise Prescription  Yes       Intervention  Provide education, explanation, and written materials on patient's individual exercise prescription       Expected Outcomes  Short  Term: Able to explain program exercise prescription;Long Term: Able to explain home exercise prescription to exercise independently          Exercise Goals Re-Evaluation:   Nutrition & Weight - Outcomes: Pre Biometrics - 08/05/18 0856      Pre Biometrics   Height  5\' 11"  (1.803 m)    Weight  100.7 kg    Waist Circumference  42 inches    Hip Circumference  42 inches    Waist to Hip Ratio  1 %    BMI (Calculated)  30.98    Triceps Skinfold  26 mm    % Body Fat  31.2 %    Grip Strength  44 kg    Flexibility  10.5 in    Single Leg Stand  17.93 seconds        Nutrition: Nutrition Therapy & Goals - 08/18/18 1427      Nutrition Therapy   Diet  heart healthy      Personal Nutrition Goals   Nutrition Goal  Pt to build a healthy plate, including non-starchy vegetables, fruit, lean protein, low fat dairy and whole grains    Personal Goal #2  pt to weigh and measure portions for accuracy       Intervention Plan   Intervention  Prescribe, educate and counsel regarding individualized specific dietary modifications aiming towards targeted core components such as weight, hypertension, lipid management, diabetes, heart failure and other comorbidities.    Expected Outcomes  Short Term Goal: Understand basic principles of dietary content, such as calories, fat, sodium, cholesterol and nutrients.;Long Term Goal: Adherence to prescribed nutrition plan.       Nutrition Discharge: Nutrition Assessments - 08/18/18 1428      MEDFICTS Scores   Pre Score  53       Education Questionnaire Score: Knowledge Questionnaire Score - 08/05/18 0837      Knowledge Questionnaire Score   Pre Score  22/24       Goals reviewed with patient; copy given to patient.

## 2018-12-03 ENCOUNTER — Telehealth: Payer: Self-pay | Admitting: Nurse Practitioner

## 2018-12-03 LAB — CHROMOGRANIN A: Chromogranin A (ng/mL): 1288 ng/mL — ABNORMAL HIGH (ref 0.0–101.8)

## 2018-12-03 NOTE — Telephone Encounter (Signed)
Called and spoke with patient. Confirmed date and time  °

## 2018-12-05 ENCOUNTER — Other Ambulatory Visit: Payer: Self-pay | Admitting: Cardiology

## 2018-12-05 DIAGNOSIS — I2510 Atherosclerotic heart disease of native coronary artery without angina pectoris: Secondary | ICD-10-CM

## 2018-12-09 ENCOUNTER — Telehealth: Payer: Self-pay

## 2018-12-09 NOTE — Telephone Encounter (Signed)
Patient called inquiring how long it takes for Dotatate scan scheduled on 12/12/18 to be read and results reflected in chart. Called IR, results should be available in 24-48 hours. Patient has second opinion with Dr. Leamon Arnt on 12/15/18. Advised patient to call if he needs additional information for apt on 7/13.

## 2018-12-12 ENCOUNTER — Other Ambulatory Visit: Payer: Self-pay

## 2018-12-12 ENCOUNTER — Encounter (HOSPITAL_COMMUNITY)
Admission: RE | Admit: 2018-12-12 | Discharge: 2018-12-12 | Disposition: A | Discharge status: Home / Self Care | Payer: Federal, State, Local not specified - PPO | Source: Ambulatory Visit | Attending: Nurse Practitioner | Admitting: Nurse Practitioner

## 2018-12-12 DIAGNOSIS — C801 Malignant (primary) neoplasm, unspecified: Secondary | ICD-10-CM | POA: Diagnosis not present

## 2018-12-12 DIAGNOSIS — C7B02 Secondary carcinoid tumors of liver: Secondary | ICD-10-CM | POA: Diagnosis not present

## 2018-12-12 IMAGING — PT NUCLEAR MEDICINE  NOPR SKULL BASE TO THIGH
1 of 10 series · 2 of 16 positions shown, 3 images · non-contrast
Comparison: None.

CLINICAL DATA: Initial staging of metastatic carcinoid tumor to the
liver.

EXAM:
NUCLEAR MEDICINE PET SKULL BASE TO THIGH
TECHNIQUE: 4.87 mCi Ga 68 DOTATATE was injected intravenously. Full-ring PET
imaging was performed from the skull base to thigh after the
radiotracer. CT data was obtained and used for attenuation
correction and anatomic localization.

[Series 4: ct sk_thigh 5.0 b31f · axial · 0.98mm/px · z∈[-1410,-418]mm · 2 of 249 slices shown, 3 images]
[im 1/249  soft-tissue]
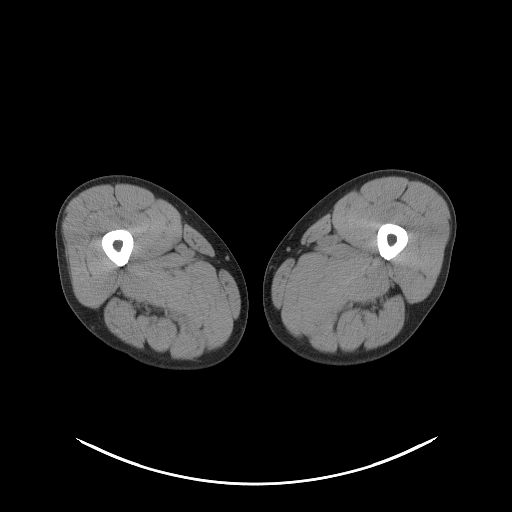
[im 1/249  bone]
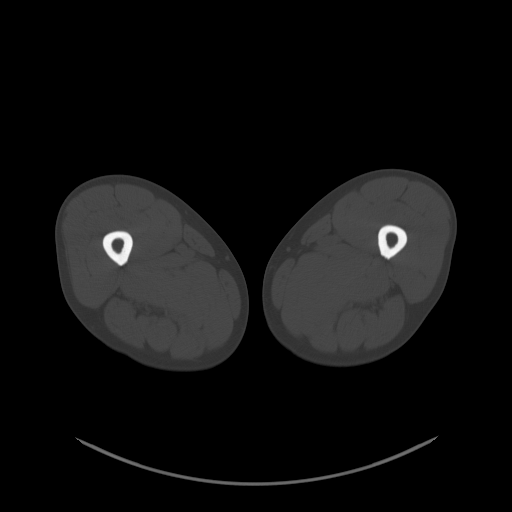
[im 249/249  soft-tissue]
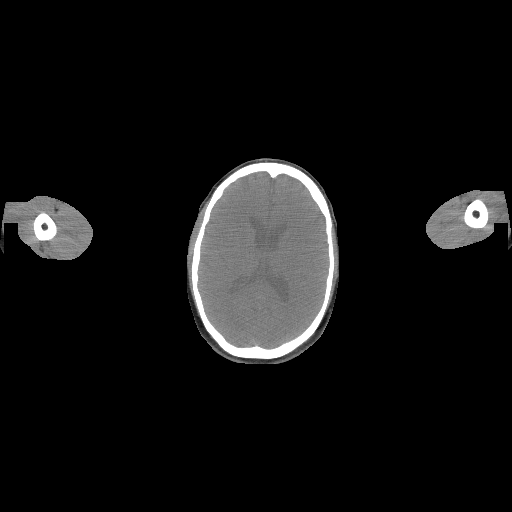

[2 of 16 positions shown; findings below may reference images not displayed]

FINDINGS: NECK

No radiotracer activity in neck lymph nodes.

Incidental CT findings: Intense activity within the thyroid gland is
favored goiter versus thyroiditis. Thyroid carcinoma is not favored.

CHEST

No radiotracer accumulation within mediastinal or hilar lymph nodes.
No suspicious pulmonary nodules on the CT scan.

Incidental CT finding:Coronary artery calcification and aortic
atherosclerotic calcification.

ABDOMEN/PELVIS

Within the central mesentery there is a lobular mass with mild
internal calcifications measuring 2.7 x 3.2 cm (image 158/4). This
mesenteric mass has intense radiotracer activity with SUV max equal
18.3.

Within the small bowel, medial to the mass (midline), there is a
focus of radiotracer activity with SUV max equal 14.7 (image 161/4).
There is no CT correlation at this level; however, on comparison
diagnostic CT of [DATE], there is subtle bowel wall thickening
in retrospect (image 58/2).

No additional bowel lesions are present. No additional metastatic
mesenteric lesions, lymph nodes, or peritoneal implants.

Intense radiotracer activity are associated with the multifocal
hepatic lesions in LEFT and RIGHT hepatic lobe. These lesions are
intense with SUV max greater than 30.

Example lesion in the LEFT hepatic lobe measures 3.5 cm with SUV max
equal 32.

Lesion in the central LEFT hepatic lobe measuring 3.6 cm SUV max
equal 30.

Lesion inferior RIGHT hepatic lobe measuring 2.3 cm with SUV max
equal 25.

There approximately 12 lesions in each lobe. Lesions are well
depicted on noncontrast CT as hypodense lesions.

Physiologic activity noted in the liver, spleen, adrenal glands and
kidneys.

Incidental CT findings:Atherosclerotic calcification of the aorta.
Nonobstructing LEFT renal calculus. Prostate normal.

SKELETON

No focal activity to suggest skeletal metastasis.

Incidental CT findings:None
IMPRESSION: 1. Intense radiotracer activity associated with the lobular
mesenteric mass consistent well differentiated neuroendocrine tumor
metastasis.
2. Focal radiotracer activity within the small bowel just medial to
the mass with corresponding thickening on comparison diagnostic CT
is favored PRIMARY SMALL BOWEL NEOPLASM.
3. Multifocal hepatic metastasis consistent well differentiated
neuroendocrine tumor.

## 2018-12-12 MED ORDER — GALLIUM GA 68 DOTATATE IV KIT
4.8700 | PACK | Freq: Once | INTRAVENOUS | Status: AC | PRN
  Administered 2018-12-12: 4.87 via INTRAVENOUS

## 2018-12-15 ENCOUNTER — Ambulatory Visit: Payer: Federal, State, Local not specified - PPO | Admitting: Cardiology

## 2018-12-15 DIAGNOSIS — C7B8 Other secondary neuroendocrine tumors: Secondary | ICD-10-CM | POA: Diagnosis not present

## 2018-12-15 DIAGNOSIS — C7A8 Other malignant neuroendocrine tumors: Secondary | ICD-10-CM | POA: Insufficient documentation

## 2018-12-16 DIAGNOSIS — C787 Secondary malignant neoplasm of liver and intrahepatic bile duct: Secondary | ICD-10-CM | POA: Diagnosis not present

## 2018-12-16 DIAGNOSIS — C801 Malignant (primary) neoplasm, unspecified: Secondary | ICD-10-CM | POA: Diagnosis not present

## 2018-12-17 ENCOUNTER — Inpatient Hospital Stay: Payer: Federal, State, Local not specified - PPO | Attending: Nurse Practitioner | Admitting: Oncology

## 2018-12-17 ENCOUNTER — Other Ambulatory Visit: Payer: Self-pay

## 2018-12-17 ENCOUNTER — Encounter: Payer: Self-pay | Admitting: *Deleted

## 2018-12-17 ENCOUNTER — Inpatient Hospital Stay: Payer: Federal, State, Local not specified - PPO

## 2018-12-17 ENCOUNTER — Telehealth: Payer: Self-pay | Admitting: Oncology

## 2018-12-17 DIAGNOSIS — N289 Disorder of kidney and ureter, unspecified: Secondary | ICD-10-CM | POA: Diagnosis not present

## 2018-12-17 DIAGNOSIS — D3A098 Benign carcinoid tumors of other sites: Secondary | ICD-10-CM | POA: Diagnosis not present

## 2018-12-17 DIAGNOSIS — I2510 Atherosclerotic heart disease of native coronary artery without angina pectoris: Secondary | ICD-10-CM | POA: Insufficient documentation

## 2018-12-17 DIAGNOSIS — I119 Hypertensive heart disease without heart failure: Secondary | ICD-10-CM

## 2018-12-17 DIAGNOSIS — C7A8 Other malignant neuroendocrine tumors: Secondary | ICD-10-CM

## 2018-12-17 NOTE — Progress Notes (Signed)
  [facility] OFFICE PROGRESS NOTE   Diagnosis: Metastatic carcinoid tumor  INTERVAL HISTORY:   William Hancock returns as scheduled.  He reports feeling well.  He has approximately 2 loose stools per day.  He had a few days of increased diarrhea last weekend.  No other complaint. He saw Dr. Leamon Arnt last week.  Dr. Leamon Arnt recommends beginning the metastatic therapy with lanreotide.  Objective:  Vital signs in last 24 hours:  Blood pressure 128/87, pulse (!) 57, temperature 98.7 F (37.1 C), temperature source Oral, resp. rate 19, height _0  (1.803 m), weight 222 lb 8 oz (100.9 kg), SpO2 100 %.    Physical examination not performed today secondary to distancing with the coded pandemic  Lab Results:  Lab Results  Component Value Date   WBC 7.8 11/13/2018   HGB 14.7 11/13/2018   HCT 43.2 11/13/2018   MCV 92.1 11/13/2018   PLT 154 11/13/2018   NEUTROABS 3.5 11/13/2018    CMP  Lab Results  Component Value Date   NA 141 11/13/2018   K 4.2 11/13/2018   CL 108 11/13/2018   CO2 25 11/13/2018   GLUCOSE 88 11/13/2018   BUN 18 11/13/2018   CREATININE 1.67 (H) 11/13/2018   CALCIUM 9.3 11/13/2018   PROT 6.9 11/13/2018   ALBUMIN 4.2 11/13/2018   AST 78 (H) 11/13/2018   ALT 64 (H) 11/13/2018   ALKPHOS 49 11/13/2018   BILITOT 0.8 11/13/2018   GFRNONAA 45 (L) 11/13/2018   GFRAA 53 (L) 11/13/2018      Imaging:  Netspot from 12/12/2018-images reviewed with William Hancock Medications: I have reviewed the patient's current medications.   Assessment/Plan: 1. Metastatic neuroendocrine tumor, well-differentiated  10/31/2018 Abdominal ultrasound-numerous nearly confluent heterogeneous masses measuring up to 3.5 cm.    11/04/2018 CT abdomen/pelvis-multiple enhancing hepatic lesions and a central partially calcified spiculated mesenteric mass measuring 3.1 x 3.1 cm.    11/13/2018 biopsy liver lesion-metastatic well-differentiated neuroendocrine tumor positive for CKAE1-AE3,  synaptophysin, chromogranin, CD56 and CDX-2; Ki-67 labeling index less than 3%; TTF-1 negative  Netspot 12/12/2018- increased activity associated a lobular mesenteric mass, small bowel medial to the mesenteric mass, and multiple liver lesions  Elevated chromogranin A level 2. History of MI/stent placement 3. CAD 4. Hypertension 5. Renal insufficiency     Disposition: William Hancock has been diagnosed with a metastatic carcinoid tumor.  He appears to have a primary tumor in the small bowel with adjacent mesenteric adenopathy and extensive liver metastases.  I discussed treatment options with William Hancock.  His wife was present for the visit by telephone.  He will begin treatment with lanreotide.  We reviewed potential toxicities associated with lanreotide.  He understands no therapy will be curative.  The goal of lanreotide is to slow disease progression.  We will consider treatment with Lutathera when he develops disease progression.  He will return a 24-hour urine 5 HIAA next week.  The plan is to begin lanreotide on 12/23/2018.  William Coder, MD  12/17/2018  10:31 AM

## 2018-12-17 NOTE — Telephone Encounter (Signed)
Scheduled appt per 7/15 los. ° °Printed calendar and avs. ° ° °

## 2018-12-17 NOTE — Patient Instructions (Signed)
Managed care is working on the prior authorization for this treatment. Depending on your insurance, it can take up to 5 business days to get this approved.   Lanreotide injection What is this medicine? LANREOTIDE (lan REE oh tide) is used to reduce blood levels of growth hormone in patients with a condition called acromegaly. It also works to slow or stop tumor growth in patients with neuroendocrine tumors and treat carcinoid syndrome. This medicine may be used for other purposes; ask your health care provider or pharmacist if you have questions. COMMON BRAND NAME(S): Somatuline [place] What should I tell my health care provider before I take this medicine? They need to know if you have any of these conditions:  diabetes  gallbladder disease  heart disease  kidney disease  liver disease  thyroid disease  an unusual or allergic reaction to lanreotide, other medicines, foods, dyes, or preservatives  pregnant or trying to get pregnant  breast-feeding How should I use this medicine? This medicine is for injection under the skin. It is given by a health care professional in a hospital or clinic setting. Contact your pediatrician or health care professional regarding the use of this medicine in children. Special care may be needed. Overdosage: If you think you have taken too much of this medicine contact a poison control center or emergency room at once. NOTE: This medicine is only for you. Do not share this medicine with others. What if I miss a dose? It is important not to miss your dose. Call your doctor or health care professional if you are unable to keep an appointment. What may interact with this medicine? This medicine may interact with the following medications:  bromocriptine  cyclosporine  certain medicines for blood pressure, heart disease, irregular heart beat  certain medicines for diabetes  quinidine  terfenadine This list may not describe all possible  interactions. Give your health care provider a list of all the medicines, herbs, non-prescription drugs, or dietary supplements you use. Also tell them if you smoke, drink alcohol, or use illegal drugs. Some items may interact with your medicine. What should I watch for while using this medicine? Tell your doctor or healthcare professional if your symptoms do not start to get better or if they get worse. Visit your doctor or health care professional for regular checks on your progress. Your condition will be monitored carefully while you are receiving this medicine. This medicine may increase blood sugar. Ask your healthcare provider if changes in diet or medicines are needed if you have diabetes. You may need blood work done while you are taking this medicine. Women should inform their doctor if they wish to become pregnant or think they might be pregnant. There is a potential for serious side effects to an unborn child. Talk to your health care professional or pharmacist for more information. Do not breast-feed an infant while taking this medicine or for 6 months after stopping it. This medicine has caused ovarian failure in some women. This medicine may interfere with the ability to have a child. Talk with your doctor or health care professional if you are concerned about your fertility. What side effects may I notice from receiving this medicine? Side effects that you should report to your doctor or health care professional as soon as possible:  allergic reactions like skin rash, itching or hives, swelling of the face, lips, or tongue  increased blood pressure  severe stomach pain  signs and symptoms of hgh blood sugar such as  being more thirsty or hungry or having to urinate more than normal. You may also feel very tired or have blurry vision.  signs and symptoms of low blood sugar such as feeling anxious; confusion; dizziness; increased hunger; unusually weak or tired; sweating; shakiness;  cold; irritable; headache; blurred vision; fast heartbeat; loss of consciousness  unusually slow heartbeat Side effects that usually do not require medical attention (report to your doctor or health care professional if they continue or are bothersome):  constipation  diarrhea  dizziness  headache  muscle pain  muscle spasms  nausea  pain, redness, or irritation at site where injected This list may not describe all possible side effects. Call your doctor for medical advice about side effects. You may report side effects to FDA at 1-800-FDA-1088. Where should I keep my medicine? This drug is given in a hospital or clinic and will not be stored at home. NOTE: This sheet is a summary. It may not cover all possible information. If you have questions about this medicine, talk to your doctor, pharmacist, or health care provider.  2020 [employer]/Gold Standard (2018-02-27 09:13:08)

## 2018-12-18 ENCOUNTER — Encounter: Payer: Self-pay | Admitting: Oncology

## 2018-12-18 DIAGNOSIS — N183 Chronic kidney disease, stage 3 (moderate): Secondary | ICD-10-CM | POA: Diagnosis not present

## 2018-12-18 DIAGNOSIS — I2510 Atherosclerotic heart disease of native coronary artery without angina pectoris: Secondary | ICD-10-CM | POA: Diagnosis not present

## 2018-12-18 DIAGNOSIS — R748 Abnormal levels of other serum enzymes: Secondary | ICD-10-CM | POA: Diagnosis not present

## 2018-12-18 DIAGNOSIS — C7B8 Other secondary neuroendocrine tumors: Secondary | ICD-10-CM | POA: Diagnosis not present

## 2018-12-19 ENCOUNTER — Encounter: Payer: Self-pay | Admitting: Cardiology

## 2018-12-19 ENCOUNTER — Telehealth: Payer: Self-pay | Admitting: *Deleted

## 2018-12-19 LAB — COMPREHENSIVE METABOLIC PANEL
ALT: 64 IU/L — ABNORMAL HIGH (ref 0–44)
AST: 81 IU/L — ABNORMAL HIGH (ref 0–40)
Albumin/Globulin Ratio: 2 (ref 1.2–2.2)
Albumin: 4.5 g/dL (ref 3.8–4.9)
Alkaline Phosphatase: 54 IU/L (ref 39–117)
BUN/Creatinine Ratio: 11 (ref 9–20)
BUN: 20 mg/dL (ref 6–24)
Bilirubin Total: 0.7 mg/dL (ref 0.0–1.2)
CO2: 23 mmol/L (ref 20–29)
Calcium: 9.6 mg/dL (ref 8.7–10.2)
Chloride: 102 mmol/L (ref 96–106)
Creatinine, Ser: 1.83 mg/dL — ABNORMAL HIGH (ref 0.76–1.27)
GFR calc Af Amer: 47 mL/min/{1.73_m2} — ABNORMAL LOW (ref >59)
GFR calc non Af Amer: 41 mL/min/{1.73_m2} — ABNORMAL LOW (ref >59)
Globulin, Total: 2.3 g/dL (ref 1.5–4.5)
Glucose: 83 mg/dL (ref 65–99)
Potassium: 4.9 mmol/L (ref 3.5–5.2)
Sodium: 140 mmol/L (ref 134–144)
Total Protein: 6.8 g/dL (ref 6.0–8.5)

## 2018-12-19 LAB — LIPID PANEL
Chol/HDL Ratio: 2.1 ratio (ref 0.0–5.0)
Cholesterol, Total: 104 mg/dL (ref 100–199)
HDL: 49 mg/dL (ref >39)
LDL Calculated: 42 mg/dL (ref 0–99)
Triglycerides: 63 mg/dL (ref 0–149)
VLDL Cholesterol Cal: 13 mg/dL (ref 5–40)

## 2018-12-19 NOTE — Telephone Encounter (Signed)
Notified wife that his thyroid studies show he is very hypothyroid and needs treatment. Faxed labs and last office note to Dr. Delfina Redwood. Suggested they call him Monday if they have not called him by then. Also informed her that insurance has authorized the lanreotide.

## 2018-12-22 ENCOUNTER — Ambulatory Visit: Payer: Federal, State, Local not specified - PPO | Admitting: Cardiology

## 2018-12-22 ENCOUNTER — Encounter: Payer: Self-pay | Admitting: Cardiology

## 2018-12-22 ENCOUNTER — Other Ambulatory Visit: Payer: Self-pay

## 2018-12-22 VITALS — BP 122/85 | HR 55

## 2018-12-22 DIAGNOSIS — I2510 Atherosclerotic heart disease of native coronary artery without angina pectoris: Secondary | ICD-10-CM

## 2018-12-22 DIAGNOSIS — R748 Abnormal levels of other serum enzymes: Secondary | ICD-10-CM

## 2018-12-22 DIAGNOSIS — C7B8 Other secondary neuroendocrine tumors: Secondary | ICD-10-CM

## 2018-12-22 MED ORDER — METOPROLOL SUCCINATE ER 25 MG PO TB24: 25.0000 mg | ORAL_TABLET | Freq: Every day | ORAL | 2 refills | Status: DC

## 2018-12-22 MED ORDER — METOPROLOL SUCCINATE ER 50 MG PO TB24: 50.0000 mg | ORAL_TABLET | Freq: Every day | ORAL | 3 refills | Status: DC

## 2018-12-22 NOTE — Progress Notes (Signed)
Virtual Visit via Video Note   Subjective:   William Hancock, male    DOB: 03/25/1966, 53 y.o.   MRN: [id number]   I connected with the patient on 12/22/18 by a video enabled telemedicine application and verified that I am speaking with the correct person using two identifiers.     I discussed the limitations of evaluation and management by telemedicine and the availability of in person appointments. The patient expressed understanding and agreed to proceed.   This visit type was conducted due to national recommendations for restrictions regarding the COVID-19 Pandemic (e.g. social distancing).  This format is felt to be most appropriate for this patient at this time.  All issues noted in this document were discussed and addressed.  No physical exam was performed (except for noted visual exam findings with Tele health visits).  The patient has consented to conduct a Tele health visit and understands insurance will be billed.     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  Patient was recently diagnosed with metastatic neuroendocrine tumor.  DAPT was interrupted for liver biopsy, without any cardiac adverse events. He appears to have a primary tumor in the small bowel with adjacent mesenteric adenopathy and extensive liver metastases. He recently began treatment with lanreotide. While the therapy will not be curative, the goal of lanreotide is to slow disease progression. His onoclogist Dr. Benay Spice will consider treatment with Lutathera when he develops disease progression. Patient will also be seeing MET specialist Dr. Leslie Andrea at [facility]  Patient is doing well from cardiac standpoint. He denies chest pain, shortness of breath, palpitations, leg edema,  orthopnea, PND, TIA/syncope.  He was recently diagnosed with hypothyroidism. Treatment initiation is pending.  Past Medical History:  Diagnosis Date  . Coronary artery disease   . Hyperlipidemia   . Hypertension   . Myocardial infarction ([place])   . Neuro-endocrine cancer ([place]) 11/07/2018     Past Surgical History:  Procedure Laterality Date  . CARDIAC CATHETERIZATION  06/09/2018  . CORONARY ATHERECTOMY N/A 06/09/2018   Procedure: CORONARY ATHERECTOMY;  Surgeon: Nigel Mormon, MD;  Location: [place] [facility];  Service: Cardiovascular;  Laterality: N/A;  . CORONARY STENT INTERVENTION N/A 06/06/2018   Procedure: CORONARY STENT INTERVENTION;  Surgeon: Nigel Mormon, MD;  Location: [place] [facility];  Service: Cardiovascular;  Laterality: N/A;  . CORONARY STENT INTERVENTION  06/09/2018  . CORONARY STENT INTERVENTION N/A 06/09/2018   Procedure: CORONARY STENT INTERVENTION;  Surgeon: Nigel Mormon, MD;  Location: [place] [facility];  Service: Cardiovascular;  Laterality: N/A;  . INTRAVASCULAR ULTRASOUND/IVUS N/A 06/06/2018   Procedure: Intravascular Ultrasound/IVUS;  Surgeon: Nigel Mormon, MD;  Location: [place] [facility];  Service: Cardiovascular;  Laterality: N/A;  . INTRAVASCULAR ULTRASOUND/IVUS N/A 06/09/2018   Procedure: Intravascular Ultrasound/IVUS;  Surgeon: Nigel Mormon, MD;  Location: [place] [facility];  Service: Cardiovascular;  Laterality: N/A;  . KNEE SURGERY Left 2009  . LEFT HEART CATH AND CORONARY ANGIOGRAPHY N/A 06/06/2018   Procedure: LEFT HEART CATH AND CORONARY ANGIOGRAPHY;  Surgeon: Nigel Mormon, MD;  Location: [place] [facility];  Service: Cardiovascular;  Laterality: N/A;  . TONSILLECTOMY    . WISDOM TOOTH EXTRACTION       Social History   Socioeconomic History  .  Marital status: Married    Spouse name: Not on file  . Number of children: 2  . Years of education: Not on file  . Highest education level: Not on file   Occupational History  . Occupation: Curator  Social Needs  . Financial resource strain: Not on file  . Food insecurity    Worry: Not on file    Inability: Not on file  . Transportation needs    Medical: Not on file    Non-medical: Not on file  Tobacco Use  . Smoking status: Former Smoker    Quit date: 06/05/1998    Years since quitting: 20.5  . Smokeless tobacco: Never Used  Substance and Sexual Activity  . Alcohol use: Yes    Comment: 2 drinks/month  . Drug use: Never  . Sexual activity: Not on file  Lifestyle  . Physical activity    Days per week: Not on file    Minutes per session: Not on file  . Stress: Not on file  Relationships  . Social Herbalist on phone: Not on file    Gets together: Not on file    Attends religious service: Not on file    Active member of club or organization: Not on file    Attends meetings of clubs or organizations: Not on file    Relationship status: Not on file  . Intimate partner violence    Fear of current or ex partner: Not on file    Emotionally abused: Not on file    Physically abused: Not on file    Forced sexual activity: Not on file  Other Topics Concern  . Not on file  Social History Narrative  . Not on file     Current Outpatient Medications on File Prior to Visit  Medication Sig Dispense Refill  . amLODipine (NORVASC) 5 MG tablet Take 1 tablet (5 mg total) by mouth daily. 90 tablet 0  . aspirin 81 MG chewable tablet Chew 1 tablet (81 mg total) by mouth daily. 90 tablet 0  . atorvastatin (LIPITOR) 40 MG tablet Take 1 tablet (40 mg total) by mouth daily at 6 PM. 60 tablet 3  . betamethasone dipropionate (DIPROLENE) 0.05 % cream Apply 1 application topically daily.    . metoprolol succinate (TOPROL-XL) 25 MG 24 hr tablet TAKE TWO TABLETS BY MOUTH DAILY WITH OR IMMEDIATELY FOLLOWING A MEAL 180 tablet 2  . nitroGLYCERIN (NITROSTAT) 0.4 MG SL tablet Place 1 tablet (0.4 mg total) under the tongue every 5  (five) minutes x 3 doses as needed for chest pain. (Patient not taking: Reported on 12/02/2018) 25 tablet 1  . ticagrelor (BRILINTA) 90 MG TABS tablet Take 1 tablet (90 mg total) by mouth 2 (two) times daily. 60 tablet 3  . valACYclovir (VALTREX) 500 MG tablet as needed.    . vitamin B-12 (CYANOCOBALAMIN) 500 MCG tablet Take 1,000 mcg by mouth daily.     No current facility-administered medications on file prior to visit.     Cardiovascular studies:  Cath 06/09/2018: LM: Normal  LAD: Prox to mid LAD/Diag1 bifurcation severe calcific 80% stenosis        Diag 1 mid 75% stenosis        Atherectomy and prox LAD into Diag         Synergy DES 2.75 X 16 mm DES        Overlapping Stent into prox LAD Synergy DES 3.0 X 38 mm  Provisional stent mid LAD with minicrush technique Synergy DES 2.75 X 16 mm DES Ramus; 50 % proximal disease LCx: Mild luminal irregularities RCA: Stenting performed 06/06/2018        Severe mid 99% stenosis--->Orsero 3.5 X 26 mm Orsero DES         Post dilatation with 4.0 mm [place] balloon        Severe prox 80% stenosis--->Orsero 3.5 X 9 mm DES        Post dilatation with 4.0 mm [place] balloon   Recommendation: DAPT with Aspirin and brilinta for 1 year Aggressive risk factor modification  Hospital echocardiogram 06/06/2018: - Left ventricle: The cavity size was normal. There was mild   concentric hypertrophy. Systolic function was normal. The   estimated ejection fraction was in the range of 55% to 60%. Wall   motion was normal; there were no regional wall motion   abnormalities. Doppler parameters are consistent with abnormal   left ventricular relaxation (grade 1 diastolic dysfunction). - No significant valvular abnormality.  Recent labs: Results for AARAV, BURGETT (MRN [id number]) as of 09/07/2018 16:56  Ref. Range 09/01/2018 08:10  COMPREHENSIVE METABOLIC PANEL Unknown Rpt (A)  Sodium Latest Ref Range: 134 - 144 mmol/L 143  Potassium Latest Ref Range: 3.5 - 5.2  mmol/L 5.0  Chloride Latest Ref Range: 96 - 106 mmol/L 105  CO2 Latest Ref Range: 20 - 29 mmol/L 23  Glucose Latest Ref Range: 65 - 99 mg/dL 80  BUN Latest Ref Range: 6 - 24 mg/dL 17  Creatinine Latest Ref Range: 0.76 - 1.27 mg/dL 1.70 (H)  Calcium Latest Ref Range: 8.7 - 10.2 mg/dL 9.4  BUN/Creatinine Ratio Latest Ref Range: 9 - 20  10  Alkaline Phosphatase Latest Ref Range: 39 - 117 IU/L 83  Albumin Latest Ref Range: 3.8 - 4.9 g/dL 4.7  Albumin/Globulin Ratio Latest Ref Range: 1.2 - 2.2  1.7  AST Latest Ref Range: 0 - 40 IU/L 96 (H)  ALT Latest Ref Range: 0 - 44 IU/L 87 (H)  Total Protein Latest Ref Range: 6.0 - 8.5 g/dL 7.5  Total Bilirubin Latest Ref Range: 0.0 - 1.2 mg/dL 0.9  GFR, Est Non African American Latest Ref Range: >59 mL/min/1.73 44 (L)  GFR, Est African American Latest Ref Range: >59 mL/min/1.73 51 (L)   Results for BABE, CLENNEY (MRN [id number]) as of 09/07/2018 16:56  Ref. Range 09/01/2018 08:10  Cholesterol, Total Latest Ref Range: 100 - 199 mg/dL 92 (L)  HDL Cholesterol Latest Ref Range: >39 mg/dL 42  LDL (calc) Latest Ref Range: 0 - 99 mg/dL 37  Triglycerides Latest Ref Range: 0 - 149 mg/dL 66  VLDL Cholesterol Cal Latest Ref Range: 5 - 40 mg/dL 13   Results for EITHEN, CASTIGLIA (MRN [id number]) as of 09/07/2018 16:56  Ref. Range 06/06/2018 02:22  Total CHOL/HDL Ratio Latest Units: RATIO 4.7  Cholesterol Latest Ref Range: 0 - 200 mg/dL 235 (H)  HDL Cholesterol Latest Ref Range: >40 mg/dL 50  LDL (calc) Latest Ref Range: 0 - 99 mg/dL 158 (H)  Triglycerides Latest Ref Range: <150 mg/dL 133  VLDL Latest Ref Range: 0 - 40 mg/dL 27    06/10/2018: Glucose 100. BUN/Cr 10/1.46. eGFR 54. Na/K 136/3.9 H/H 14/40. MCV 90. Platelets 187.   Review of Systems  Constitution: Negative for decreased appetite, malaise/fatigue, weight gain and weight loss.  HENT: Negative for congestion.   Eyes: Negative for visual disturbance.  Cardiovascular: Negative for chest pain, claudication,  dyspnea on exertion, leg  swelling, palpitations and syncope.  Respiratory: Negative for shortness of breath.   Endocrine: Negative for cold intolerance.  Hematologic/Lymphatic: Does not bruise/bleed easily.  Skin: Negative for itching and rash.  Musculoskeletal: Negative for myalgias.  Gastrointestinal: Negative for abdominal pain, nausea and vomiting.  Genitourinary: Negative for dysuria.  Neurological: Negative for dizziness and weakness.  Psychiatric/Behavioral: The patient is not nervous/anxious.   All other systems reviewed and are negative.        Vitals:   12/22/18 1021  BP: 122/85  Pulse: (!) 55   (Measured by the patient using a home BP monitor)   Observation/findings during video visit   Objective:   Physical Exam  Constitutional: He is oriented to person, place, and time. He appears well-developed and well-nourished. No distress.  Neck: No JVD present.  Pulmonary/Chest: Effort normal.  Neurological: He is alert and oriented to person, place, and time.  Psychiatric: He has a normal mood and affect.  Nursing note and vitals reviewed.         Assessment & Recommendations:   53 year old Caucasian male with hypertension, hyperlipidemia, coronary artery disease, s/p multivessel complex PCI (pro & mid RCA, prox-mid LAD/Diag bifurcation) for NSTEMI in 06/2018, now with new diagnosis of metastatic neuroendocrine tumor   CAD: S/p multilvessel PCI for NSTEMI 06/2018 No angina symptoms Continue DAPT at least till 06/2019. Continue metoprolol succinate 50 mg daily, amlodipine 5 mg daily, lipitor 40 mg dail.y, With initiation of levothyroxine, I anticipate his heart rate will go up.  Hypertension: Very well controlled.  Hyperlipidemia: Continue lipitor 40 mg daily. Advised against higher dose given his elevated livery enzymes in the setting of MET.  Metastatic neuroendocrine tumor: On lanreotide, managed by Dr. Benay Spice.   F/u in 6 months.   Nigel Mormon, MD [facility] [place] Pager: [phone] Office: [phone] If no answer Cell [phone]

## 2018-12-23 ENCOUNTER — Encounter: Payer: Self-pay | Admitting: Nurse Practitioner

## 2018-12-23 ENCOUNTER — Other Ambulatory Visit: Payer: Self-pay

## 2018-12-23 ENCOUNTER — Inpatient Hospital Stay: Payer: Federal, State, Local not specified - PPO

## 2018-12-23 ENCOUNTER — Telehealth: Payer: Self-pay | Admitting: *Deleted

## 2018-12-23 ENCOUNTER — Telehealth: Payer: Self-pay

## 2018-12-23 VITALS — BP 128/78 | HR 59 | Temp 98.2°F | Resp 18

## 2018-12-23 DIAGNOSIS — N289 Disorder of kidney and ureter, unspecified: Secondary | ICD-10-CM | POA: Diagnosis not present

## 2018-12-23 DIAGNOSIS — I2510 Atherosclerotic heart disease of native coronary artery without angina pectoris: Secondary | ICD-10-CM | POA: Diagnosis not present

## 2018-12-23 DIAGNOSIS — D3A098 Benign carcinoid tumors of other sites: Secondary | ICD-10-CM

## 2018-12-23 DIAGNOSIS — I119 Hypertensive heart disease without heart failure: Secondary | ICD-10-CM | POA: Diagnosis not present

## 2018-12-23 DIAGNOSIS — C7A8 Other malignant neuroendocrine tumors: Secondary | ICD-10-CM | POA: Diagnosis not present

## 2018-12-23 MED ORDER — LANREOTIDE ACETATE 120 MG/0.5ML ~~LOC~~ SOLN
120.0000 mg | Freq: Once | SUBCUTANEOUS | Status: AC
  Administered 2018-12-23: 120 mg via SUBCUTANEOUS
  Filled 2018-12-23: qty 120

## 2018-12-23 NOTE — Telephone Encounter (Signed)
Returned TC to patient in regard to his question about wanting to know if he is at increased risk for COVID when his wife returns back to work in August. I let him know per Lattie Haw that he may be at some increased risk related to [place] and that Lattie Haw recommends that he continue to social distance, wear mask, and frequently wash hands. Patient verbalized understanding. No further problems or concerns at this time.

## 2018-12-23 NOTE — Telephone Encounter (Signed)
Lab notified this RN patient returned 24 hour urine and did not keep sample chilled. Lab has given patient a new container with instructions and stressed keeping specimen in the refrigerator and return chilled for proper testing.

## 2018-12-23 NOTE — Patient Instructions (Signed)
Lanreotide injection What is this medicine? LANREOTIDE (lan REE oh tide) is used to reduce blood levels of growth hormone in patients with a condition called acromegaly. It also works to slow or stop tumor growth in patients with neuroendocrine tumors and treat carcinoid syndrome. This medicine may be used for other purposes; ask your health care provider or pharmacist if you have questions. COMMON BRAND NAME(S): Somatuline [place] What should I tell my health care provider before I take this medicine? They need to know if you have any of these conditions:  diabetes  gallbladder disease  heart disease  kidney disease  liver disease  thyroid disease  an unusual or allergic reaction to lanreotide, other medicines, foods, dyes, or preservatives  pregnant or trying to get pregnant  breast-feeding How should I use this medicine? This medicine is for injection under the skin. It is given by a health care professional in a hospital or clinic setting. Contact your pediatrician or health care professional regarding the use of this medicine in children. Special care may be needed. Overdosage: If you think you have taken too much of this medicine contact a poison control center or emergency room at once. NOTE: This medicine is only for you. Do not share this medicine with others. What if I miss a dose? It is important not to miss your dose. Call your doctor or health care professional if you are unable to keep an appointment. What may interact with this medicine? This medicine may interact with the following medications:  bromocriptine  cyclosporine  certain medicines for blood pressure, heart disease, irregular heart beat  certain medicines for diabetes  quinidine  terfenadine This list may not describe all possible interactions. Give your health care provider a list of all the medicines, herbs, non-prescription drugs, or dietary supplements you use. Also tell them if you smoke,  drink alcohol, or use illegal drugs. Some items may interact with your medicine. What should I watch for while using this medicine? Tell your doctor or healthcare professional if your symptoms do not start to get better or if they get worse. Visit your doctor or health care professional for regular checks on your progress. Your condition will be monitored carefully while you are receiving this medicine. This medicine may increase blood sugar. Ask your healthcare provider if changes in diet or medicines are needed if you have diabetes. You may need blood work done while you are taking this medicine. Women should inform their doctor if they wish to become pregnant or think they might be pregnant. There is a potential for serious side effects to an unborn child. Talk to your health care professional or pharmacist for more information. Do not breast-feed an infant while taking this medicine or for 6 months after stopping it. This medicine has caused ovarian failure in some women. This medicine may interfere with the ability to have a child. Talk with your doctor or health care professional if you are concerned about your fertility. What side effects may I notice from receiving this medicine? Side effects that you should report to your doctor or health care professional as soon as possible:  allergic reactions like skin rash, itching or hives, swelling of the face, lips, or tongue  increased blood pressure  severe stomach pain  signs and symptoms of hgh blood sugar such as being more thirsty or hungry or having to urinate more than normal. You may also feel very tired or have blurry vision.  signs and symptoms of low blood   sugar such as feeling anxious; confusion; dizziness; increased hunger; unusually weak or tired; sweating; shakiness; cold; irritable; headache; blurred vision; fast heartbeat; loss of consciousness  unusually slow heartbeat Side effects that usually do not require medical  attention (report to your doctor or health care professional if they continue or are bothersome):  constipation  diarrhea  dizziness  headache  muscle pain  muscle spasms  nausea  pain, redness, or irritation at site where injected This list may not describe all possible side effects. Call your doctor for medical advice about side effects. You may report side effects to FDA at 1-800-FDA-1088. Where should I keep my medicine? This drug is given in a hospital or clinic and will not be stored at home. NOTE: This sheet is a summary. It may not cover all possible information. If you have questions about this medicine, talk to your doctor, pharmacist, or health care provider.  2020 Elsevier/Gold Standard (2018-02-27 09:13:08)  

## 2018-12-25 ENCOUNTER — Inpatient Hospital Stay: Payer: Federal, State, Local not specified - PPO

## 2018-12-25 DIAGNOSIS — D3A098 Benign carcinoid tumors of other sites: Secondary | ICD-10-CM | POA: Diagnosis not present

## 2018-12-25 DIAGNOSIS — I2510 Atherosclerotic heart disease of native coronary artery without angina pectoris: Secondary | ICD-10-CM | POA: Diagnosis not present

## 2018-12-25 DIAGNOSIS — I119 Hypertensive heart disease without heart failure: Secondary | ICD-10-CM | POA: Diagnosis not present

## 2018-12-25 DIAGNOSIS — C7A8 Other malignant neuroendocrine tumors: Secondary | ICD-10-CM | POA: Diagnosis not present

## 2018-12-25 DIAGNOSIS — N289 Disorder of kidney and ureter, unspecified: Secondary | ICD-10-CM | POA: Diagnosis not present

## 2018-12-26 ENCOUNTER — Encounter: Payer: Self-pay | Admitting: Oncology

## 2018-12-26 DIAGNOSIS — E039 Hypothyroidism, unspecified: Secondary | ICD-10-CM | POA: Diagnosis not present

## 2018-12-26 DIAGNOSIS — C7A8 Other malignant neuroendocrine tumors: Secondary | ICD-10-CM | POA: Diagnosis not present

## 2019-01-01 ENCOUNTER — Ambulatory Visit: Payer: Federal, State, Local not specified - PPO | Admitting: Cardiology

## 2019-01-02 LAB — 5 HIAA, QUANTITATIVE, URINE, 24 HOUR
5-HIAA, Ur: 107.9 mg/L
5-HIAA,Quant.,24 Hr Urine: 151.1 mg/24 hr — ABNORMAL HIGH (ref 0.0–14.9)
Total Volume: 1400

## 2019-01-19 ENCOUNTER — Telehealth: Payer: Self-pay

## 2019-01-19 NOTE — Telephone Encounter (Signed)
Patient called asking if I could relay a question to Dr. Benay Spice prior to his appointment on 8/18. Patient would like to know if he is a candidate for PRRT therapy.

## 2019-01-19 NOTE — Telephone Encounter (Signed)
Patient called and left VM to confirm appointment on 01/20/19.  Returned call and left VM on his answering machine.

## 2019-01-20 ENCOUNTER — Inpatient Hospital Stay: Payer: Federal, State, Local not specified - PPO

## 2019-01-20 ENCOUNTER — Other Ambulatory Visit: Payer: Self-pay

## 2019-01-20 ENCOUNTER — Telehealth: Payer: Self-pay | Admitting: Oncology

## 2019-01-20 ENCOUNTER — Inpatient Hospital Stay: Payer: Federal, State, Local not specified - PPO | Attending: Nurse Practitioner | Admitting: Oncology

## 2019-01-20 VITALS — BP 125/76 | HR 63 | Temp 98.2°F | Resp 18 | Ht 71.0 in | Wt 220.4 lb

## 2019-01-20 DIAGNOSIS — C7A8 Other malignant neuroendocrine tumors: Secondary | ICD-10-CM | POA: Diagnosis not present

## 2019-01-20 DIAGNOSIS — D3A098 Benign carcinoid tumors of other sites: Secondary | ICD-10-CM | POA: Diagnosis not present

## 2019-01-20 DIAGNOSIS — C7B02 Secondary carcinoid tumors of liver: Secondary | ICD-10-CM | POA: Insufficient documentation

## 2019-01-20 DIAGNOSIS — N289 Disorder of kidney and ureter, unspecified: Secondary | ICD-10-CM | POA: Insufficient documentation

## 2019-01-20 DIAGNOSIS — E039 Hypothyroidism, unspecified: Secondary | ICD-10-CM | POA: Diagnosis not present

## 2019-01-20 DIAGNOSIS — Z79899 Other long term (current) drug therapy: Secondary | ICD-10-CM | POA: Diagnosis not present

## 2019-01-20 DIAGNOSIS — I252 Old myocardial infarction: Secondary | ICD-10-CM | POA: Diagnosis not present

## 2019-01-20 DIAGNOSIS — I10 Essential (primary) hypertension: Secondary | ICD-10-CM | POA: Insufficient documentation

## 2019-01-20 MED ORDER — LANREOTIDE ACETATE 120 MG/0.5ML ~~LOC~~ SOLN
120.0000 mg | Freq: Once | SUBCUTANEOUS | Status: AC
  Administered 2019-01-20: 120 mg via SUBCUTANEOUS
  Filled 2019-01-20: qty 120

## 2019-01-20 NOTE — Patient Instructions (Signed)
Lanreotide injection What is this medicine? LANREOTIDE (lan REE oh tide) is used to reduce blood levels of growth hormone in patients with a condition called acromegaly. It also works to slow or stop tumor growth in patients with neuroendocrine tumors and treat carcinoid syndrome. This medicine may be used for other purposes; ask your health care provider or pharmacist if you have questions. COMMON BRAND NAME(S): Somatuline [place] What should I tell my health care provider before I take this medicine? They need to know if you have any of these conditions:  diabetes  gallbladder disease  heart disease  kidney disease  liver disease  thyroid disease  an unusual or allergic reaction to lanreotide, other medicines, foods, dyes, or preservatives  pregnant or trying to get pregnant  breast-feeding How should I use this medicine? This medicine is for injection under the skin. It is given by a health care professional in a hospital or clinic setting. Contact your pediatrician or health care professional regarding the use of this medicine in children. Special care may be needed. Overdosage: If you think you have taken too much of this medicine contact a poison control center or emergency room at once. NOTE: This medicine is only for you. Do not share this medicine with others. What if I miss a dose? It is important not to miss your dose. Call your doctor or health care professional if you are unable to keep an appointment. What may interact with this medicine? This medicine may interact with the following medications:  bromocriptine  cyclosporine  certain medicines for blood pressure, heart disease, irregular heart beat  certain medicines for diabetes  quinidine  terfenadine This list may not describe all possible interactions. Give your health care provider a list of all the medicines, herbs, non-prescription drugs, or dietary supplements you use. Also tell them if you smoke,  drink alcohol, or use illegal drugs. Some items may interact with your medicine. What should I watch for while using this medicine? Tell your doctor or healthcare professional if your symptoms do not start to get better or if they get worse. Visit your doctor or health care professional for regular checks on your progress. Your condition will be monitored carefully while you are receiving this medicine. This medicine may increase blood sugar. Ask your healthcare provider if changes in diet or medicines are needed if you have diabetes. You may need blood work done while you are taking this medicine. Women should inform their doctor if they wish to become pregnant or think they might be pregnant. There is a potential for serious side effects to an unborn child. Talk to your health care professional or pharmacist for more information. Do not breast-feed an infant while taking this medicine or for 6 months after stopping it. This medicine has caused ovarian failure in some women. This medicine may interfere with the ability to have a child. Talk with your doctor or health care professional if you are concerned about your fertility. What side effects may I notice from receiving this medicine? Side effects that you should report to your doctor or health care professional as soon as possible:  allergic reactions like skin rash, itching or hives, swelling of the face, lips, or tongue  increased blood pressure  severe stomach pain  signs and symptoms of hgh blood sugar such as being more thirsty or hungry or having to urinate more than normal. You may also feel very tired or have blurry vision.  signs and symptoms of low blood   sugar such as feeling anxious; confusion; dizziness; increased hunger; unusually weak or tired; sweating; shakiness; cold; irritable; headache; blurred vision; fast heartbeat; loss of consciousness  unusually slow heartbeat Side effects that usually do not require medical  attention (report to your doctor or health care professional if they continue or are bothersome):  constipation  diarrhea  dizziness  headache  muscle pain  muscle spasms  nausea  pain, redness, or irritation at site where injected This list may not describe all possible side effects. Call your doctor for medical advice about side effects. You may report side effects to FDA at 1-800-FDA-1088. Where should I keep my medicine? This drug is given in a hospital or clinic and will not be stored at home. NOTE: This sheet is a summary. It may not cover all possible information. If you have questions about this medicine, talk to your doctor, pharmacist, or health care provider.  2020 Elsevier/Gold Standard (2018-02-27 09:13:08)  

## 2019-01-20 NOTE — Telephone Encounter (Signed)
Called and spoke with patient. Confirmed sept and oct appts

## 2019-01-20 NOTE — Progress Notes (Signed)
  [place] OFFICE PROGRESS NOTE   Diagnosis: Metastatic neuroendocrine tumor  INTERVAL HISTORY:   [name] returns as scheduled.  He began lanreotide on 12/23/2018.  Diarrhea has improved.  He feels well.  He also started thyroid hormone replacement.  Good appetite and energy level.  No new complaint.  Objective:  Vital signs in last 24 hours:  Blood pressure 125/76, pulse 63, temperature 98.2 F (36.8 C), temperature source Oral, resp. rate 18, height '5\' 11"'$  (1.803 m), weight 220 lb 6.4 oz (100 kg), SpO2 99 %.    Limited physical examination secondary to distancing with the COVID pandemic Cardio: Regular rate and rhythm GI: Nontender, no mass, no hepatosplenomegaly, soft Vascular: No leg edema   Lab Results:  Lab Results  Component Value Date   WBC 7.8 11/13/2018   HGB 14.7 11/13/2018   HCT 43.2 11/13/2018   MCV 92.1 11/13/2018   PLT 154 11/13/2018   NEUTROABS 3.5 11/13/2018    CMP  Lab Results  Component Value Date   NA 140 12/18/2018   K 4.9 12/18/2018   CL 102 12/18/2018   CO2 23 12/18/2018   GLUCOSE 83 12/18/2018   BUN 20 12/18/2018   CREATININE 1.83 (H) 12/18/2018   CALCIUM 9.6 12/18/2018   PROT 6.8 12/18/2018   ALBUMIN 4.5 12/18/2018   AST 81 (H) 12/18/2018   ALT 64 (H) 12/18/2018   ALKPHOS 54 12/18/2018   BILITOT 0.7 12/18/2018   GFRNONAA 41 (L) 12/18/2018   GFRAA 47 (L) 12/18/2018     Medications: I have reviewed the patient's current medications.   Assessment/Plan: 1. Metastatic neuroendocrine tumor, well-differentiated  10/31/2018 Abdominal ultrasound-numerous nearly confluent heterogeneous masses measuring up to 3.5 cm.    11/04/2018 CT abdomen/pelvis-multiple enhancing hepatic lesions and a central partially calcified spiculated mesenteric mass measuring 3.1 x 3.1 cm.    11/13/2018 biopsy liver lesion-metastatic well-differentiated neuroendocrine tumor positive for CKAE1-AE3, synaptophysin, chromogranin, CD56 and CDX-2;  Ki-67 labeling index less than 3%; TTF-1 negative  Netspot 12/12/2018- increased activity associated a lobular mesenteric mass, small bowel medial to the mesenteric mass, and multiple liver lesions  Elevated chromogranin A level and 24-hour urine 5 HIAA  Monthly lanreotide beginning 12/23/2018 2. History of MI/stent placement 3. CAD 4. Hypertension 5. Renal insufficiency 6. 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  The plan is to continue monthly lanreotide.  We will check a chromogranin A level when he returns for an office visit in 2 months.  He appears to be tolerating the lanreotide well.  Betsy Coder, MD  01/20/2019  9:12 AM

## 2019-02-03 DIAGNOSIS — E039 Hypothyroidism, unspecified: Secondary | ICD-10-CM | POA: Diagnosis not present

## 2019-02-03 DIAGNOSIS — Z0000 Encounter for general adult medical examination without abnormal findings: Secondary | ICD-10-CM | POA: Diagnosis not present

## 2019-02-03 DIAGNOSIS — I2510 Atherosclerotic heart disease of native coronary artery without angina pectoris: Secondary | ICD-10-CM | POA: Diagnosis not present

## 2019-02-17 ENCOUNTER — Inpatient Hospital Stay: Payer: Federal, State, Local not specified - PPO

## 2019-02-17 ENCOUNTER — Inpatient Hospital Stay: Payer: Federal, State, Local not specified - PPO | Attending: Nurse Practitioner

## 2019-02-17 ENCOUNTER — Other Ambulatory Visit: Payer: Self-pay

## 2019-02-17 VITALS — BP 128/72 | HR 62 | Temp 98.2°F | Resp 18

## 2019-02-17 DIAGNOSIS — E039 Hypothyroidism, unspecified: Secondary | ICD-10-CM | POA: Insufficient documentation

## 2019-02-17 DIAGNOSIS — Z23 Encounter for immunization: Secondary | ICD-10-CM | POA: Diagnosis not present

## 2019-02-17 DIAGNOSIS — D3A098 Benign carcinoid tumors of other sites: Secondary | ICD-10-CM

## 2019-02-17 DIAGNOSIS — C7B02 Secondary carcinoid tumors of liver: Secondary | ICD-10-CM | POA: Diagnosis not present

## 2019-02-17 DIAGNOSIS — R197 Diarrhea, unspecified: Secondary | ICD-10-CM | POA: Diagnosis not present

## 2019-02-17 DIAGNOSIS — Z79899 Other long term (current) drug therapy: Secondary | ICD-10-CM | POA: Diagnosis not present

## 2019-02-17 DIAGNOSIS — I10 Essential (primary) hypertension: Secondary | ICD-10-CM | POA: Insufficient documentation

## 2019-02-17 DIAGNOSIS — C7A098 Malignant carcinoid tumors of other sites: Secondary | ICD-10-CM | POA: Insufficient documentation

## 2019-02-17 MED ORDER — INFLUENZA VAC SPLIT QUAD 0.5 ML IM SUSY
0.5000 mL | PREFILLED_SYRINGE | Freq: Once | INTRAMUSCULAR | Status: AC
  Administered 2019-02-17: 0.5 mL via INTRAMUSCULAR

## 2019-02-17 MED ORDER — INFLUENZA VAC SPLIT QUAD 0.5 ML IM SUSY
PREFILLED_SYRINGE | INTRAMUSCULAR | Status: AC
  Filled 2019-02-17: qty 0.5

## 2019-02-17 MED ORDER — LANREOTIDE ACETATE 120 MG/0.5ML ~~LOC~~ SOLN
120.0000 mg | Freq: Once | SUBCUTANEOUS | Status: AC
  Administered 2019-02-17: 10:00:00 120 mg via SUBCUTANEOUS
  Filled 2019-02-17: qty 120

## 2019-02-17 NOTE — Patient Instructions (Signed)
Lanreotide injection What is this medicine? LANREOTIDE (lan REE oh tide) is used to reduce blood levels of growth hormone in patients with a condition called acromegaly. It also works to slow or stop tumor growth in patients with neuroendocrine tumors and treat carcinoid syndrome. This medicine may be used for other purposes; ask your health care provider or pharmacist if you have questions. COMMON BRAND NAME(S): Somatuline [place] What should I tell my health care provider before I take this medicine? They need to know if you have any of these conditions:  diabetes  gallbladder disease  heart disease  kidney disease  liver disease  thyroid disease  an unusual or allergic reaction to lanreotide, other medicines, foods, dyes, or preservatives  pregnant or trying to get pregnant  breast-feeding How should I use this medicine? This medicine is for injection under the skin. It is given by a health care professional in a hospital or clinic setting. Contact your pediatrician or health care professional regarding the use of this medicine in children. Special care may be needed. Overdosage: If you think you have taken too much of this medicine contact a poison control center or emergency room at once. NOTE: This medicine is only for you. Do not share this medicine with others. What if I miss a dose? It is important not to miss your dose. Call your doctor or health care professional if you are unable to keep an appointment. What may interact with this medicine? This medicine may interact with the following medications:  bromocriptine  cyclosporine  certain medicines for blood pressure, heart disease, irregular heart beat  certain medicines for diabetes  quinidine  terfenadine This list may not describe all possible interactions. Give your health care provider a list of all the medicines, herbs, non-prescription drugs, or dietary supplements you use. Also tell them if you smoke,  drink alcohol, or use illegal drugs. Some items may interact with your medicine. What should I watch for while using this medicine? Tell your doctor or healthcare professional if your symptoms do not start to get better or if they get worse. Visit your doctor or health care professional for regular checks on your progress. Your condition will be monitored carefully while you are receiving this medicine. This medicine may increase blood sugar. Ask your healthcare provider if changes in diet or medicines are needed if you have diabetes. You may need blood work done while you are taking this medicine. Women should inform their doctor if they wish to become pregnant or think they might be pregnant. There is a potential for serious side effects to an unborn child. Talk to your health care professional or pharmacist for more information. Do not breast-feed an infant while taking this medicine or for 6 months after stopping it. This medicine has caused ovarian failure in some women. This medicine may interfere with the ability to have a child. Talk with your doctor or health care professional if you are concerned about your fertility. What side effects may I notice from receiving this medicine? Side effects that you should report to your doctor or health care professional as soon as possible:  allergic reactions like skin rash, itching or hives, swelling of the face, lips, or tongue  increased blood pressure  severe stomach pain  signs and symptoms of hgh blood sugar such as being more thirsty or hungry or having to urinate more than normal. You may also feel very tired or have blurry vision.  signs and symptoms of low blood   sugar such as feeling anxious; confusion; dizziness; increased hunger; unusually weak or tired; sweating; shakiness; cold; irritable; headache; blurred vision; fast heartbeat; loss of consciousness  unusually slow heartbeat Side effects that usually do not require medical  attention (report to your doctor or health care professional if they continue or are bothersome):  constipation  diarrhea  dizziness  headache  muscle pain  muscle spasms  nausea  pain, redness, or irritation at site where injected This list may not describe all possible side effects. Call your doctor for medical advice about side effects. You may report side effects to FDA at 1-800-FDA-1088. Where should I keep my medicine? This drug is given in a hospital or clinic and will not be stored at home. NOTE: This sheet is a summary. It may not cover all possible information. If you have questions about this medicine, talk to your doctor, pharmacist, or health care provider.  2020 Elsevier/Gold Standard (2018-02-27 09:13:08)  

## 2019-03-02 DIAGNOSIS — Z1159 Encounter for screening for other viral diseases: Secondary | ICD-10-CM | POA: Diagnosis not present

## 2019-03-02 DIAGNOSIS — R05 Cough: Secondary | ICD-10-CM | POA: Diagnosis not present

## 2019-03-17 ENCOUNTER — Telehealth: Payer: Self-pay | Admitting: *Deleted

## 2019-03-17 ENCOUNTER — Other Ambulatory Visit: Payer: Federal, State, Local not specified - PPO

## 2019-03-17 ENCOUNTER — Ambulatory Visit: Payer: Federal, State, Local not specified - PPO | Admitting: Oncology

## 2019-03-17 ENCOUNTER — Inpatient Hospital Stay: Payer: Federal, State, Local not specified - PPO

## 2019-03-17 ENCOUNTER — Other Ambulatory Visit: Payer: Self-pay

## 2019-03-17 ENCOUNTER — Inpatient Hospital Stay: Payer: Federal, State, Local not specified - PPO | Attending: Nurse Practitioner

## 2019-03-17 ENCOUNTER — Inpatient Hospital Stay: Payer: Federal, State, Local not specified - PPO | Admitting: Oncology

## 2019-03-17 ENCOUNTER — Ambulatory Visit: Payer: Federal, State, Local not specified - PPO

## 2019-03-17 VITALS — BP 124/90 | HR 67 | Temp 98.2°F | Resp 18 | Ht 71.0 in | Wt 214.7 lb

## 2019-03-17 DIAGNOSIS — D3A098 Benign carcinoid tumors of other sites: Secondary | ICD-10-CM

## 2019-03-17 DIAGNOSIS — E039 Hypothyroidism, unspecified: Secondary | ICD-10-CM

## 2019-03-17 DIAGNOSIS — C7A098 Malignant carcinoid tumors of other sites: Secondary | ICD-10-CM | POA: Diagnosis not present

## 2019-03-17 DIAGNOSIS — C7B02 Secondary carcinoid tumors of liver: Secondary | ICD-10-CM | POA: Diagnosis not present

## 2019-03-17 LAB — CMP (CANCER CENTER ONLY)
ALT: 31 U/L (ref 0–44)
AST: 24 U/L (ref 15–41)
Albumin: 4 g/dL (ref 3.5–5.0)
Alkaline Phosphatase: 68 U/L (ref 38–126)
Anion gap: 10 (ref 5–15)
BUN: 18 mg/dL (ref 6–20)
CO2: 24 mmol/L (ref 22–32)
Calcium: 9.5 mg/dL (ref 8.9–10.3)
Chloride: 109 mmol/L (ref 98–111)
Creatinine: 1.36 mg/dL — ABNORMAL HIGH (ref 0.61–1.24)
GFR, Est AFR Am: >60 mL/min (ref >60)
GFR, Estimated: 58 mL/min — ABNORMAL LOW (ref >60)
Glucose, Bld: 100 mg/dL — ABNORMAL HIGH (ref 70–99)
Potassium: 4.4 mmol/L (ref 3.5–5.1)
Sodium: 143 mmol/L (ref 135–145)
Total Bilirubin: 1 mg/dL (ref 0.3–1.2)
Total Protein: 6.8 g/dL (ref 6.5–8.1)

## 2019-03-17 LAB — TSH: TSH: 2.658 u[IU]/mL (ref 0.320–4.118)

## 2019-03-17 MED ORDER — LANREOTIDE ACETATE 120 MG/0.5ML ~~LOC~~ SOLN
120.0000 mg | Freq: Once | SUBCUTANEOUS | Status: AC
  Administered 2019-03-17: 10:00:00 120 mg via SUBCUTANEOUS
  Filled 2019-03-17: qty 120

## 2019-03-17 NOTE — Progress Notes (Signed)
  [place] OFFICE PROGRESS NOTE   Diagnosis: Carcinoid tumor  INTERVAL HISTORY:   William Hancock returns as scheduled.  He has completed 3 months of lanreotide therapy.  He feels well.  No diarrhea.  Good appetite.  He is working.  No complaint.  Objective:  Vital signs in last 24 hours:  Blood pressure 124/90, pulse 67, temperature 98.2 F (36.8 C), temperature source Temporal, resp. rate 18, height '5\' 11"'$  (1.803 m), weight 214 lb 11.2 oz (97.4 kg), SpO2 100 %.    Limited examination secondary to distancing with the COVID pandemic Cardio: Regular rate and rhythm GI: No hepatomegaly, nontender Vascular: No leg edema  Lab Results:  Lab Results  Component Value Date   WBC 7.8 11/13/2018   HGB 14.7 11/13/2018   HCT 43.2 11/13/2018   MCV 92.1 11/13/2018   PLT 154 11/13/2018   NEUTROABS 3.5 11/13/2018    CMP  Lab Results  Component Value Date   NA 140 12/18/2018   K 4.9 12/18/2018   CL 102 12/18/2018   CO2 23 12/18/2018   GLUCOSE 83 12/18/2018   BUN 20 12/18/2018   CREATININE 1.83 (H) 12/18/2018   CALCIUM 9.6 12/18/2018   PROT 6.8 12/18/2018   ALBUMIN 4.5 12/18/2018   AST 81 (H) 12/18/2018   ALT 64 (H) 12/18/2018   ALKPHOS 54 12/18/2018   BILITOT 0.7 12/18/2018   GFRNONAA 41 (L) 12/18/2018   GFRAA 47 (L) 12/18/2018     Medications: I have reviewed the patient's current medications.   Assessment/Plan: 1. Metastatic neuroendocrine tumor, well-differentiated  10/31/2018 Abdominal ultrasound-numerous nearly confluent heterogeneous masses measuring up to 3.5 cm.    11/04/2018 CT abdomen/pelvis-multiple enhancing hepatic lesions and a central partially calcified spiculated mesenteric mass measuring 3.1 x 3.1 cm.    11/13/2018 biopsy liver lesion-metastatic well-differentiated neuroendocrine tumor positive for CKAE1-AE3, synaptophysin, chromogranin, CD56 and CDX-2; Ki-67 labeling index less than 3%; TTF-1 negative  Netspot 12/12/2018- increased  activity associated a lobular mesenteric mass, small bowel medial to the mesenteric mass, and multiple liver lesions  Elevated chromogranin A level and 24-hour urine 5 HIAA  Monthly lanreotide beginning 12/23/2018 2. History of MI/stent placement 3. CAD 4. Hypertension 5. Renal insufficiency 6. Hypothyroidism diagnosed with markedly elevated TSH and low T4 on 12/18/2018    Disposition: William Hancock appears stable.  He is asymptomatic from the metastatic carcinoid tumor.  He will continue monthly lanreotide.  We will follow-up on the chromogranin A level from today.  He will undergo a restaging CT prior to an office visit in 3 months.  He requested we add a TSH to his labs from today.  Betsy Coder, MD  03/17/2019  9:06 AM

## 2019-03-17 NOTE — Patient Instructions (Signed)
Lanreotide injection What is this medicine? LANREOTIDE (lan REE oh tide) is used to reduce blood levels of growth hormone in patients with a condition called acromegaly. It also works to slow or stop tumor growth in patients with neuroendocrine tumors and treat carcinoid syndrome. This medicine may be used for other purposes; ask your health care provider or pharmacist if you have questions. COMMON BRAND NAME(S): Somatuline [place] What should I tell my health care provider before I take this medicine? They need to know if you have any of these conditions:  diabetes  gallbladder disease  heart disease  kidney disease  liver disease  thyroid disease  an unusual or allergic reaction to lanreotide, other medicines, foods, dyes, or preservatives  pregnant or trying to get pregnant  breast-feeding How should I use this medicine? This medicine is for injection under the skin. It is given by a health care professional in a hospital or clinic setting. Contact your pediatrician or health care professional regarding the use of this medicine in children. Special care may be needed. Overdosage: If you think you have taken too much of this medicine contact a poison control center or emergency room at once. NOTE: This medicine is only for you. Do not share this medicine with others. What if I miss a dose? It is important not to miss your dose. Call your doctor or health care professional if you are unable to keep an appointment. What may interact with this medicine? This medicine may interact with the following medications:  bromocriptine  cyclosporine  certain medicines for blood pressure, heart disease, irregular heart beat  certain medicines for diabetes  quinidine  terfenadine This list may not describe all possible interactions. Give your health care provider a list of all the medicines, herbs, non-prescription drugs, or dietary supplements you use. Also tell them if you smoke,  drink alcohol, or use illegal drugs. Some items may interact with your medicine. What should I watch for while using this medicine? Tell your doctor or healthcare professional if your symptoms do not start to get better or if they get worse. Visit your doctor or health care professional for regular checks on your progress. Your condition will be monitored carefully while you are receiving this medicine. This medicine may increase blood sugar. Ask your healthcare provider if changes in diet or medicines are needed if you have diabetes. You may need blood work done while you are taking this medicine. Women should inform their doctor if they wish to become pregnant or think they might be pregnant. There is a potential for serious side effects to an unborn child. Talk to your health care professional or pharmacist for more information. Do not breast-feed an infant while taking this medicine or for 6 months after stopping it. This medicine has caused ovarian failure in some women. This medicine may interfere with the ability to have a child. Talk with your doctor or health care professional if you are concerned about your fertility. What side effects may I notice from receiving this medicine? Side effects that you should report to your doctor or health care professional as soon as possible:  allergic reactions like skin rash, itching or hives, swelling of the face, lips, or tongue  increased blood pressure  severe stomach pain  signs and symptoms of hgh blood sugar such as being more thirsty or hungry or having to urinate more than normal. You may also feel very tired or have blurry vision.  signs and symptoms of low blood   sugar such as feeling anxious; confusion; dizziness; increased hunger; unusually weak or tired; sweating; shakiness; cold; irritable; headache; blurred vision; fast heartbeat; loss of consciousness  unusually slow heartbeat Side effects that usually do not require medical  attention (report to your doctor or health care professional if they continue or are bothersome):  constipation  diarrhea  dizziness  headache  muscle pain  muscle spasms  nausea  pain, redness, or irritation at site where injected This list may not describe all possible side effects. Call your doctor for medical advice about side effects. You may report side effects to FDA at 1-800-FDA-1088. Where should I keep my medicine? This drug is given in a hospital or clinic and will not be stored at home. NOTE: This sheet is a summary. It may not cover all possible information. If you have questions about this medicine, talk to your doctor, pharmacist, or health care provider.  2020 Elsevier/Gold Standard (2018-02-27 09:13:08)  

## 2019-03-17 NOTE — Telephone Encounter (Signed)
Left VM w/normal TSH and faxed labs to Dr. Delfina Redwood.

## 2019-03-17 NOTE — Telephone Encounter (Signed)
-----   Message from Ladell Pier, MD sent at 03/17/2019 12:20 PM EDT ----- Please call patient, TSH is normal, copy labs to Dr. Delfina Redwood

## 2019-03-18 ENCOUNTER — Telehealth: Payer: Self-pay | Admitting: Oncology

## 2019-03-18 LAB — CHROMOGRANIN A: Chromogranin A (ng/mL): 1889 ng/mL — ABNORMAL HIGH (ref 0.0–101.8)

## 2019-03-18 NOTE — Telephone Encounter (Signed)
Scheduled per los. Called and spoke with patient. Confirmed appts  

## 2019-03-19 ENCOUNTER — Telehealth: Payer: Self-pay | Admitting: *Deleted

## 2019-03-19 NOTE — Telephone Encounter (Signed)
Notified of increase in chromogranin A and informed him that up and down variations are not uncommon. Continue lanreotide until the CT planned for December 2020. He verbalized understanding and agrees to plan.

## 2019-03-19 NOTE — Telephone Encounter (Signed)
-----   Message from Ladell Pier, MD sent at 03/19/2019  7:24 AM EDT ----- Please call patient, chromogranin A slightly higher, can see up and down variation , not clearly indicative of progression, continue lanreotide until planned Ct

## 2019-04-08 ENCOUNTER — Other Ambulatory Visit: Payer: Self-pay | Admitting: Cardiology

## 2019-04-14 ENCOUNTER — Other Ambulatory Visit: Payer: Self-pay

## 2019-04-14 ENCOUNTER — Inpatient Hospital Stay: Payer: Federal, State, Local not specified - PPO | Attending: Nurse Practitioner

## 2019-04-14 ENCOUNTER — Ambulatory Visit: Payer: Federal, State, Local not specified - PPO

## 2019-04-14 ENCOUNTER — Other Ambulatory Visit: Payer: Federal, State, Local not specified - PPO

## 2019-04-14 VITALS — BP 128/81 | HR 62 | Temp 98.7°F | Resp 20

## 2019-04-14 DIAGNOSIS — C7B02 Secondary carcinoid tumors of liver: Secondary | ICD-10-CM | POA: Insufficient documentation

## 2019-04-14 DIAGNOSIS — C7A098 Malignant carcinoid tumors of other sites: Secondary | ICD-10-CM | POA: Diagnosis not present

## 2019-04-14 DIAGNOSIS — D3A098 Benign carcinoid tumors of other sites: Secondary | ICD-10-CM

## 2019-04-14 MED ORDER — LANREOTIDE ACETATE 120 MG/0.5ML ~~LOC~~ SOLN
120.0000 mg | Freq: Once | SUBCUTANEOUS | Status: AC
  Administered 2019-04-14: 120 mg via SUBCUTANEOUS
  Filled 2019-04-14: qty 120

## 2019-04-14 NOTE — Patient Instructions (Signed)
Lanreotide injection What is this medicine? LANREOTIDE (lan REE oh tide) is used to reduce blood levels of growth hormone in patients with a condition called acromegaly. It also works to slow or stop tumor growth in patients with neuroendocrine tumors and treat carcinoid syndrome. This medicine may be used for other purposes; ask your health care provider or pharmacist if you have questions. COMMON BRAND NAME(S): Somatuline [place] What should I tell my health care provider before I take this medicine? They need to know if you have any of these conditions:  diabetes  gallbladder disease  heart disease  kidney disease  liver disease  thyroid disease  an unusual or allergic reaction to lanreotide, other medicines, foods, dyes, or preservatives  pregnant or trying to get pregnant  breast-feeding How should I use this medicine? This medicine is for injection under the skin. It is given by a health care professional in a hospital or clinic setting. Contact your pediatrician or health care professional regarding the use of this medicine in children. Special care may be needed. Overdosage: If you think you have taken too much of this medicine contact a poison control center or emergency room at once. NOTE: This medicine is only for you. Do not share this medicine with others. What if I miss a dose? It is important not to miss your dose. Call your doctor or health care professional if you are unable to keep an appointment. What may interact with this medicine? This medicine may interact with the following medications:  bromocriptine  cyclosporine  certain medicines for blood pressure, heart disease, irregular heart beat  certain medicines for diabetes  quinidine  terfenadine This list may not describe all possible interactions. Give your health care provider a list of all the medicines, herbs, non-prescription drugs, or dietary supplements you use. Also tell them if you smoke,  drink alcohol, or use illegal drugs. Some items may interact with your medicine. What should I watch for while using this medicine? Tell your doctor or healthcare professional if your symptoms do not start to get better or if they get worse. Visit your doctor or health care professional for regular checks on your progress. Your condition will be monitored carefully while you are receiving this medicine. This medicine may increase blood sugar. Ask your healthcare provider if changes in diet or medicines are needed if you have diabetes. You may need blood work done while you are taking this medicine. Women should inform their doctor if they wish to become pregnant or think they might be pregnant. There is a potential for serious side effects to an unborn child. Talk to your health care professional or pharmacist for more information. Do not breast-feed an infant while taking this medicine or for 6 months after stopping it. This medicine has caused ovarian failure in some women. This medicine may interfere with the ability to have a child. Talk with your doctor or health care professional if you are concerned about your fertility. What side effects may I notice from receiving this medicine? Side effects that you should report to your doctor or health care professional as soon as possible:  allergic reactions like skin rash, itching or hives, swelling of the face, lips, or tongue  increased blood pressure  severe stomach pain  signs and symptoms of hgh blood sugar such as being more thirsty or hungry or having to urinate more than normal. You may also feel very tired or have blurry vision.  signs and symptoms of low blood   sugar such as feeling anxious; confusion; dizziness; increased hunger; unusually weak or tired; sweating; shakiness; cold; irritable; headache; blurred vision; fast heartbeat; loss of consciousness  unusually slow heartbeat Side effects that usually do not require medical  attention (report to your doctor or health care professional if they continue or are bothersome):  constipation  diarrhea  dizziness  headache  muscle pain  muscle spasms  nausea  pain, redness, or irritation at site where injected This list may not describe all possible side effects. Call your doctor for medical advice about side effects. You may report side effects to FDA at 1-800-FDA-1088. Where should I keep my medicine? This drug is given in a hospital or clinic and will not be stored at home. NOTE: This sheet is a summary. It may not cover all possible information. If you have questions about this medicine, talk to your doctor, pharmacist, or health care provider.  2020 Elsevier/Gold Standard (2018-02-27 09:13:08)  

## 2019-04-23 DIAGNOSIS — Z20828 Contact with and (suspected) exposure to other viral communicable diseases: Secondary | ICD-10-CM | POA: Diagnosis not present

## 2019-05-12 ENCOUNTER — Other Ambulatory Visit: Payer: Federal, State, Local not specified - PPO

## 2019-05-12 ENCOUNTER — Other Ambulatory Visit: Payer: Self-pay

## 2019-05-12 ENCOUNTER — Ambulatory Visit: Payer: Federal, State, Local not specified - PPO

## 2019-05-12 ENCOUNTER — Inpatient Hospital Stay: Payer: Federal, State, Local not specified - PPO | Attending: Nurse Practitioner

## 2019-05-12 VITALS — BP 151/88 | HR 61 | Temp 98.4°F | Resp 18

## 2019-05-12 DIAGNOSIS — C7B02 Secondary carcinoid tumors of liver: Secondary | ICD-10-CM | POA: Diagnosis not present

## 2019-05-12 DIAGNOSIS — C7A098 Malignant carcinoid tumors of other sites: Secondary | ICD-10-CM | POA: Insufficient documentation

## 2019-05-12 DIAGNOSIS — D3A098 Benign carcinoid tumors of other sites: Secondary | ICD-10-CM

## 2019-05-12 MED ORDER — LANREOTIDE ACETATE 120 MG/0.5ML ~~LOC~~ SOLN
120.0000 mg | Freq: Once | SUBCUTANEOUS | Status: AC
  Administered 2019-05-12: 120 mg via SUBCUTANEOUS
  Filled 2019-05-12: qty 120

## 2019-05-12 NOTE — Patient Instructions (Signed)
Lanreotide injection What is this medicine? LANREOTIDE (lan REE oh tide) is used to reduce blood levels of growth hormone in patients with a condition called acromegaly. It also works to slow or stop tumor growth in patients with neuroendocrine tumors and treat carcinoid syndrome. This medicine may be used for other purposes; ask your health care provider or pharmacist if you have questions. COMMON BRAND NAME(S): Somatuline [place] What should I tell my health care provider before I take this medicine? They need to know if you have any of these conditions:  diabetes  gallbladder disease  heart disease  kidney disease  liver disease  thyroid disease  an unusual or allergic reaction to lanreotide, other medicines, foods, dyes, or preservatives  pregnant or trying to get pregnant  breast-feeding How should I use this medicine? This medicine is for injection under the skin. It is given by a health care professional in a hospital or clinic setting. Contact your pediatrician or health care professional regarding the use of this medicine in children. Special care may be needed. Overdosage: If you think you have taken too much of this medicine contact a poison control center or emergency room at once. NOTE: This medicine is only for you. Do not share this medicine with others. What if I miss a dose? It is important not to miss your dose. Call your doctor or health care professional if you are unable to keep an appointment. What may interact with this medicine? This medicine may interact with the following medications:  bromocriptine  cyclosporine  certain medicines for blood pressure, heart disease, irregular heart beat  certain medicines for diabetes  quinidine  terfenadine This list may not describe all possible interactions. Give your health care provider a list of all the medicines, herbs, non-prescription drugs, or dietary supplements you use. Also tell them if you smoke,  drink alcohol, or use illegal drugs. Some items may interact with your medicine. What should I watch for while using this medicine? Tell your doctor or healthcare professional if your symptoms do not start to get better or if they get worse. Visit your doctor or health care professional for regular checks on your progress. Your condition will be monitored carefully while you are receiving this medicine. This medicine may increase blood sugar. Ask your healthcare provider if changes in diet or medicines are needed if you have diabetes. You may need blood work done while you are taking this medicine. Women should inform their doctor if they wish to become pregnant or think they might be pregnant. There is a potential for serious side effects to an unborn child. Talk to your health care professional or pharmacist for more information. Do not breast-feed an infant while taking this medicine or for 6 months after stopping it. This medicine has caused ovarian failure in some women. This medicine may interfere with the ability to have a child. Talk with your doctor or health care professional if you are concerned about your fertility. What side effects may I notice from receiving this medicine? Side effects that you should report to your doctor or health care professional as soon as possible:  allergic reactions like skin rash, itching or hives, swelling of the face, lips, or tongue  increased blood pressure  severe stomach pain  signs and symptoms of hgh blood sugar such as being more thirsty or hungry or having to urinate more than normal. You may also feel very tired or have blurry vision.  signs and symptoms of low blood   sugar such as feeling anxious; confusion; dizziness; increased hunger; unusually weak or tired; sweating; shakiness; cold; irritable; headache; blurred vision; fast heartbeat; loss of consciousness  unusually slow heartbeat Side effects that usually do not require medical  attention (report to your doctor or health care professional if they continue or are bothersome):  constipation  diarrhea  dizziness  headache  muscle pain  muscle spasms  nausea  pain, redness, or irritation at site where injected This list may not describe all possible side effects. Call your doctor for medical advice about side effects. You may report side effects to FDA at 1-800-FDA-1088. Where should I keep my medicine? This drug is given in a hospital or clinic and will not be stored at home. NOTE: This sheet is a summary. It may not cover all possible information. If you have questions about this medicine, talk to your doctor, pharmacist, or health care provider.  2020 Elsevier/Gold Standard (2018-02-27 09:13:08)  

## 2019-05-13 ENCOUNTER — Ambulatory Visit
Admission: RE | Admit: 2019-05-13 | Discharge: 2019-05-13 | Disposition: A | Discharge status: Home / Self Care | Payer: Federal, State, Local not specified - PPO | Source: Ambulatory Visit | Attending: Oncology | Admitting: Oncology

## 2019-05-13 DIAGNOSIS — K668 Other specified disorders of peritoneum: Secondary | ICD-10-CM | POA: Diagnosis not present

## 2019-05-13 DIAGNOSIS — C787 Secondary malignant neoplasm of liver and intrahepatic bile duct: Secondary | ICD-10-CM | POA: Diagnosis not present

## 2019-05-13 DIAGNOSIS — I708 Atherosclerosis of other arteries: Secondary | ICD-10-CM | POA: Diagnosis not present

## 2019-05-13 DIAGNOSIS — D3A098 Benign carcinoid tumors of other sites: Secondary | ICD-10-CM

## 2019-05-13 DIAGNOSIS — I700 Atherosclerosis of aorta: Secondary | ICD-10-CM | POA: Diagnosis not present

## 2019-05-13 IMAGING — CT CT ABD-PELV W/ CM
1 of 3 series · 13 of 32 positions shown, 18 images · IV contrast (APPLIED)
Comparison: CT [DATE], DOTATATE PET scan [DATE].

CLINICAL DATA: Neuroendocrine tumor - f/u scan Denies any symptoms
No surgery Hx neuroendocrine tumor - no treatment - inoperable

Labs drawn at 315 Cre 1.4 GFR 56
Restaging metastatic carcinoid tumor
EXAM:
CT ABDOMEN AND PELVIS WITH CONTRAST
TECHNIQUE: Multidetector CT imaging of the abdomen and pelvis was performed
using the standard protocol following bolus administration of
intravenous contrast.
CONTRAST:  100mL [24] IOPAMIDOL ([24]) INJECTION 61%

[Series 2: abd/pelvis w/cm · axial · 0.84mm/px · z∈[-491,-86]mm · 13 of 95 slices shown, 18 images]
[im 7/95  soft-tissue]
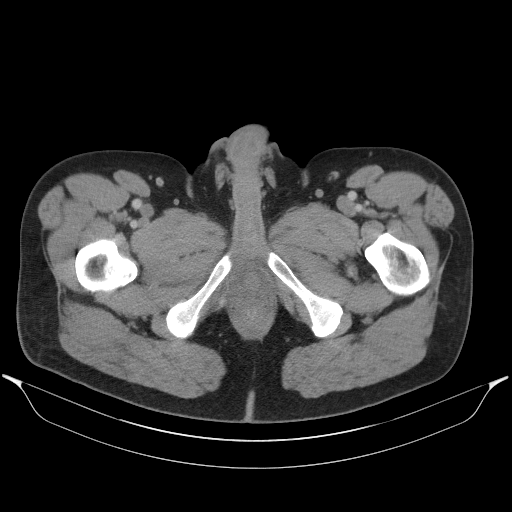
[im 7/95  bone]
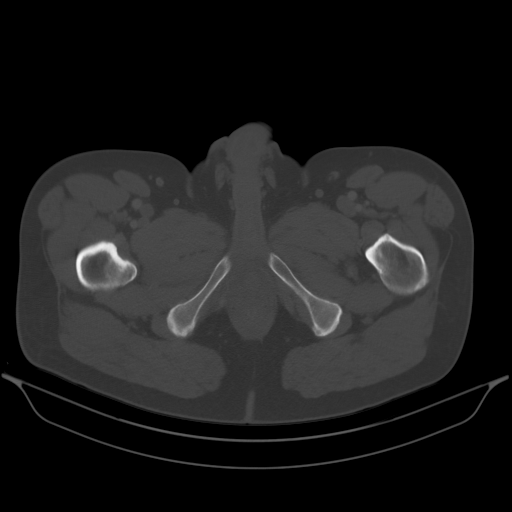
[im 13/95  soft-tissue]
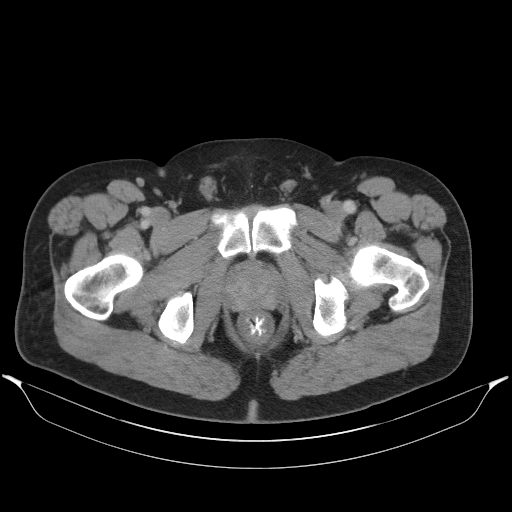
[im 19/95  soft-tissue]
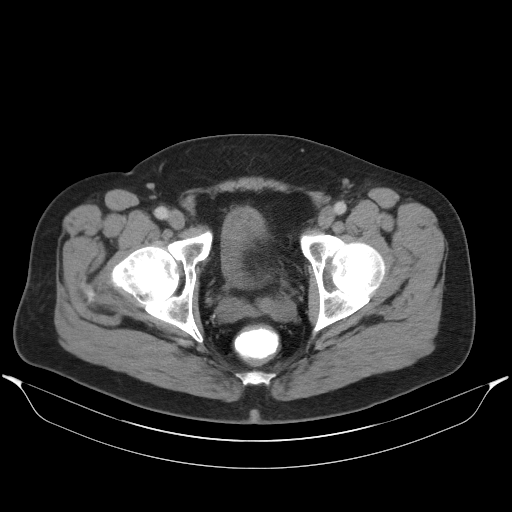
[im 32/95  soft-tissue]
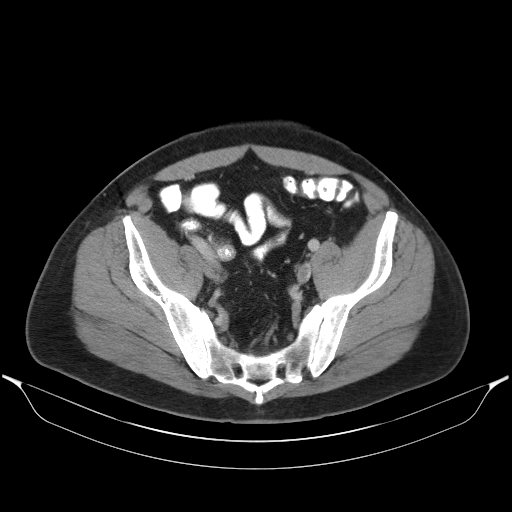
[im 38/95  soft-tissue]
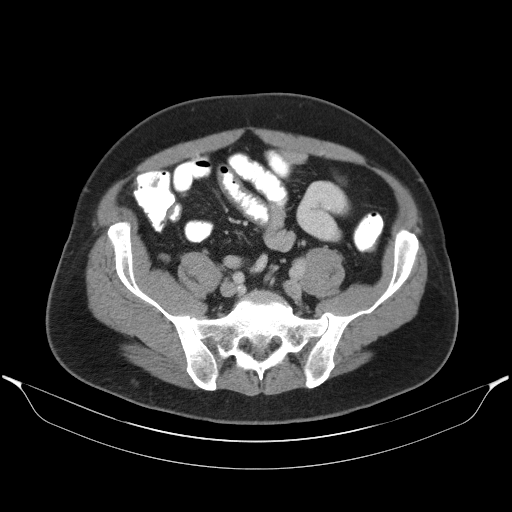
[im 44/95  soft-tissue]
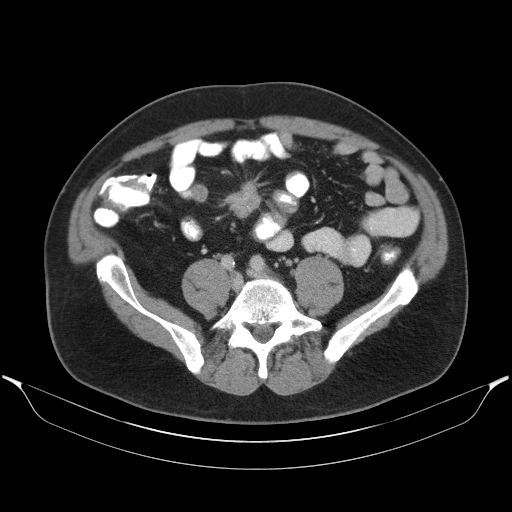
[im 51/95  soft-tissue]
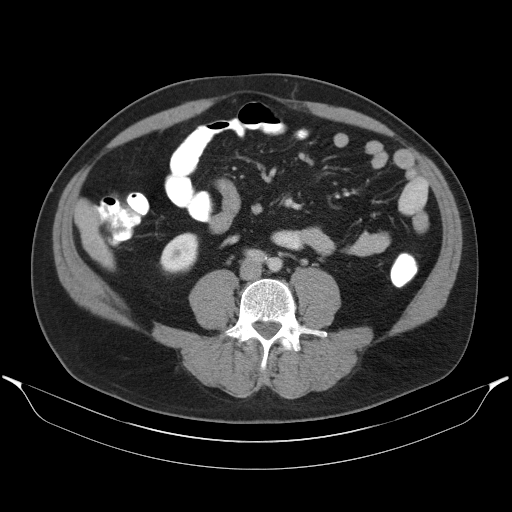
[im 57/95  soft-tissue]
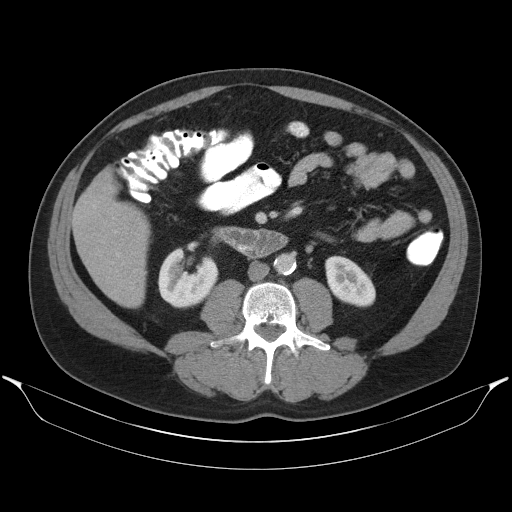
[im 63/95  soft-tissue]
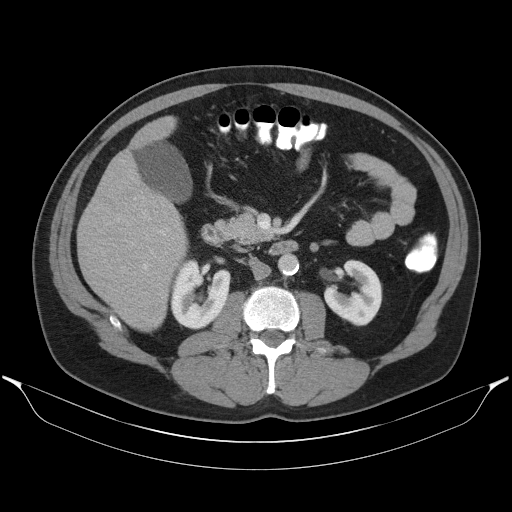
[im 63/95  bone]
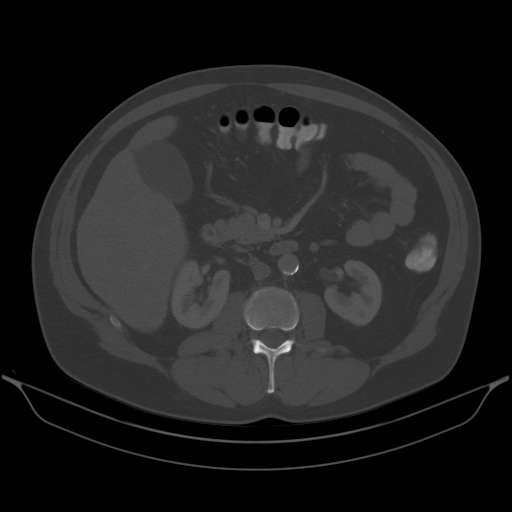
[im 69/95  lung]
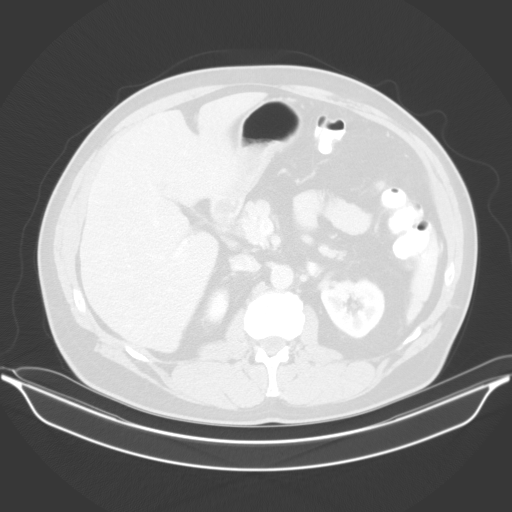
[im 76/95  soft-tissue]
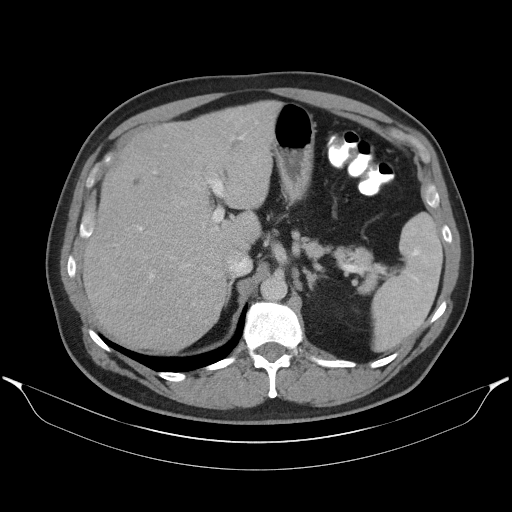
[im 76/95  lung]
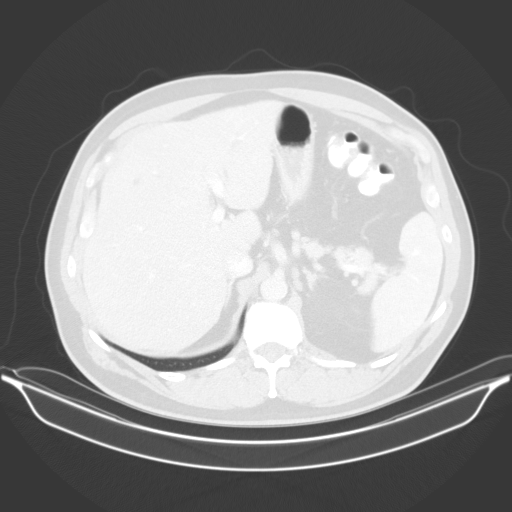
[im 82/95  soft-tissue]
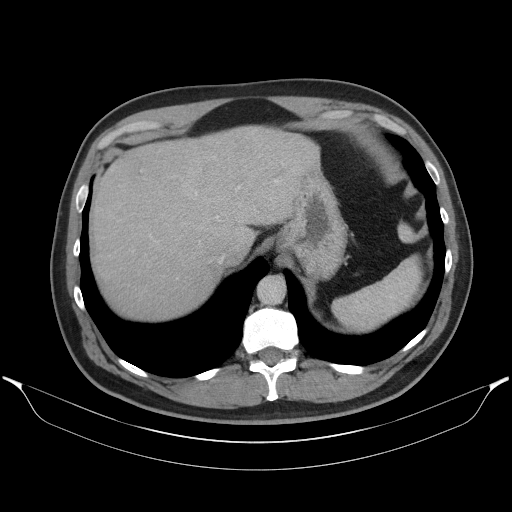
[im 82/95  lung]
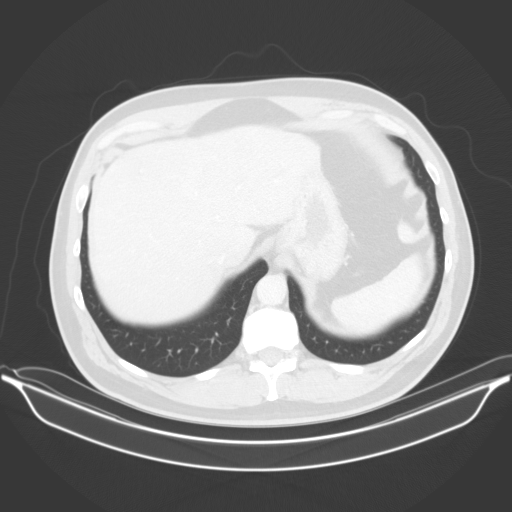
[im 88/95  soft-tissue]
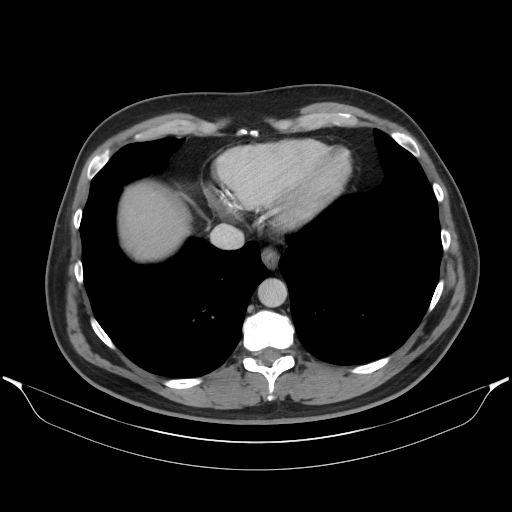
[im 88/95  lung]
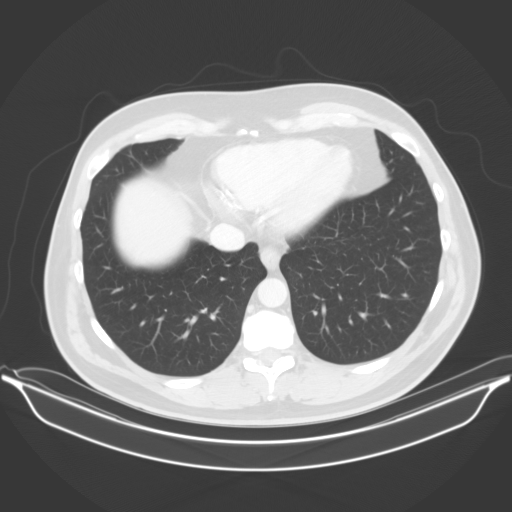

[13 of 32 positions shown; findings below may reference images not displayed]

FINDINGS: Lower chest: Lung bases are clear.

Hepatobiliary: There are innumerable metastatic lesion identified in
the liver on the DOTATATE PET scan of [DATE]. These are not well
depicted on the current contrast CT exam or the previous contrast CT
exam. Several small low-density lesions are slightly decreased in
size from comparison CT of [DATE]. For example 6 mm lesion in
the central RIGHT hepatic lobe (image [DATE] compares to 12 mm lesion
remeasured. One lesion is new from comparison exam in the central
LEFT hepatic lobe lesion measures 12 mm (image [DATE]).

The radiotracer active lesions on the comparison DOTATATE scan were
larger measuring up to 2-4 cm.

Pancreas: Pancreas is normal. No ductal dilatation. No pancreatic
inflammation.

Spleen: Normal spleen

Adrenals/urinary tract: Adrenal glands and kidneys are normal. The
ureters and bladder normal.

Stomach/Bowel: Stomach, duodenum are normal. Within the small bowel
mesentery nodular mass with central calcification again noted
measuring 3.3 by 2.6 cm compared with 3.4 x 3.1 cm for no interval
change. The nodular thickening along the small bowel described on
comparison DOTATATE PET scan is not well appreciated. No evidence of
bowel obstruction.

No new peritoneal or mesenteric disease identified. No evidence of
bowel obstruction.

The colon is normal.

Vascular/Lymphatic: Abdominal aorta is normal caliber with
atherosclerotic calcification. There is no retroperitoneal or
periportal lymphadenopathy. No pelvic lymphadenopathy.

Reproductive: Prostate normal

Other: No free fluid.

Musculoskeletal: No aggressive osseous lesion. Injection granuloma
noted in the LEFT flank.
IMPRESSION: 1. The multiple large hepatic metastasis identified on comparison
DOTATATE PET scan are poorly represented by CT imaging. Several of
the small measurable lesions are decreased in size. One lesion is
new. Hepatic MRI or DOTATATE PET scan could be a better modality for
following these lesions.
2. Stable central mesenteric mass with central calcification.
3. No evidence of bowel obstruction.
4. Aortic Atherosclerosis ([24]-[24]).

## 2019-05-13 MED ORDER — IOPAMIDOL (ISOVUE-300) INJECTION 61%
100.0000 mL | Freq: Once | INTRAVENOUS | Status: AC | PRN
  Administered 2019-05-13: 100 mL via INTRAVENOUS

## 2019-05-25 ENCOUNTER — Encounter: Payer: Self-pay | Admitting: Nurse Practitioner

## 2019-06-02 ENCOUNTER — Other Ambulatory Visit: Payer: Federal, State, Local not specified - PPO

## 2019-06-09 ENCOUNTER — Inpatient Hospital Stay: Payer: Federal, State, Local not specified - PPO

## 2019-06-09 ENCOUNTER — Other Ambulatory Visit: Payer: Self-pay | Admitting: Oncology

## 2019-06-09 ENCOUNTER — Other Ambulatory Visit: Payer: Self-pay

## 2019-06-09 ENCOUNTER — Inpatient Hospital Stay: Payer: Federal, State, Local not specified - PPO | Attending: Nurse Practitioner | Admitting: Oncology

## 2019-06-09 VITALS — BP 116/77 | HR 65 | Temp 97.6°F | Resp 16 | Ht 71.0 in | Wt 212.1 lb

## 2019-06-09 DIAGNOSIS — D3A098 Benign carcinoid tumors of other sites: Secondary | ICD-10-CM

## 2019-06-09 DIAGNOSIS — C7A098 Malignant carcinoid tumors of other sites: Secondary | ICD-10-CM | POA: Diagnosis not present

## 2019-06-09 DIAGNOSIS — I252 Old myocardial infarction: Secondary | ICD-10-CM | POA: Insufficient documentation

## 2019-06-09 DIAGNOSIS — N289 Disorder of kidney and ureter, unspecified: Secondary | ICD-10-CM | POA: Insufficient documentation

## 2019-06-09 DIAGNOSIS — C7B02 Secondary carcinoid tumors of liver: Secondary | ICD-10-CM | POA: Insufficient documentation

## 2019-06-09 DIAGNOSIS — E039 Hypothyroidism, unspecified: Secondary | ICD-10-CM | POA: Diagnosis not present

## 2019-06-09 DIAGNOSIS — I2510 Atherosclerotic heart disease of native coronary artery without angina pectoris: Secondary | ICD-10-CM | POA: Diagnosis not present

## 2019-06-09 DIAGNOSIS — I10 Essential (primary) hypertension: Secondary | ICD-10-CM | POA: Diagnosis not present

## 2019-06-09 LAB — CMP (CANCER CENTER ONLY)
ALT: 37 U/L (ref 0–44)
AST: 28 U/L (ref 15–41)
Albumin: 4.3 g/dL (ref 3.5–5.0)
Alkaline Phosphatase: 82 U/L (ref 38–126)
Anion gap: 8 (ref 5–15)
BUN: 19 mg/dL (ref 6–20)
CO2: 26 mmol/L (ref 22–32)
Calcium: 9.5 mg/dL (ref 8.9–10.3)
Chloride: 107 mmol/L (ref 98–111)
Creatinine: 1.41 mg/dL — ABNORMAL HIGH (ref 0.61–1.24)
GFR, Est AFR Am: >60 mL/min (ref >60)
GFR, Estimated: 56 mL/min — ABNORMAL LOW (ref >60)
Glucose, Bld: 86 mg/dL (ref 70–99)
Potassium: 4.8 mmol/L (ref 3.5–5.1)
Sodium: 141 mmol/L (ref 135–145)
Total Bilirubin: 1.1 mg/dL (ref 0.3–1.2)
Total Protein: 7.1 g/dL (ref 6.5–8.1)

## 2019-06-09 MED ORDER — LANREOTIDE ACETATE 120 MG/0.5ML ~~LOC~~ SOLN
120.0000 mg | Freq: Once | SUBCUTANEOUS | Status: AC
  Administered 2019-06-09: 120 mg via SUBCUTANEOUS
  Filled 2019-06-09: qty 120

## 2019-06-09 NOTE — Progress Notes (Signed)
  [facility] OFFICE PROGRESS NOTE   Diagnosis: Carcinoid tumor  INTERVAL HISTORY:   William Hancock returns as scheduled.  He reports feeling well.  Good appetite.  He continues monthly lanreotide.  No chest pain diarrhea, or constipation.  He has mild abdominal bloating.  This is unchanged since being diagnosed with the carcinoid tumor.  Objective:  Vital signs in last 24 hours:  Blood pressure 116/77, pulse 65, temperature 97.6 F (36.4 C), temperature source Temporal, resp. rate 16, height '5\' 11"'$  (1.803 m), weight 212 lb 1.6 oz (96.2 kg), SpO2 100 %.    Limited physical examination secondary to distancing with the Covid pandemic Cardio: Regular rate and rhythm GI: Fullness in the right subcostal region without a discrete liver edge the abdomen is soft and nontender Vascular: No leg edema   Lab Results:  Lab Results  Component Value Date   WBC 7.8 11/13/2018   HGB 14.7 11/13/2018   HCT 43.2 11/13/2018   MCV 92.1 11/13/2018   PLT 154 11/13/2018   NEUTROABS 3.5 11/13/2018    CMP  Lab Results  Component Value Date   NA 143 03/17/2019   K 4.4 03/17/2019   CL 109 03/17/2019   CO2 24 03/17/2019   GLUCOSE 100 (H) 03/17/2019   BUN 18 03/17/2019   CREATININE 1.36 (H) 03/17/2019   CALCIUM 9.5 03/17/2019   PROT 6.8 03/17/2019   ALBUMIN 4.0 03/17/2019   AST 24 03/17/2019   ALT 31 03/17/2019   ALKPHOS 68 03/17/2019   BILITOT 1.0 03/17/2019   GFRNONAA 58 (L) 03/17/2019   GFRAA >60 03/17/2019     Medications: I have reviewed the patient's current medications.   Assessment/Plan: 1. Metastatic neuroendocrine tumor, well-differentiated  10/31/2018 Abdominal ultrasound-numerous nearly confluent heterogeneous masses measuring up to 3.5 cm.    11/04/2018 CT abdomen/pelvis-multiple enhancing hepatic lesions and a central partially calcified spiculated mesenteric mass measuring 3.1 x 3.1 cm.    11/13/2018 biopsy liver lesion-metastatic well-differentiated  neuroendocrine tumor positive for CKAE1-AE3, synaptophysin, chromogranin, CD56 and CDX-2; Ki-67 labeling index less than 3%; TTF-1 negative  Netspot 12/12/2018- increased activity associated a lobular mesenteric mass, small bowel medial to the mesenteric mass, and multiple liver lesions  Elevated chromogranin A level and 24-hour urine 5 HIAA  Monthly lanreotide beginning 12/23/2018  CT 05/13/2019-multiple large hepatic metastases on the dotatate are poorly visualized, several small lesions are decreased in size 1 new lesion, stable mesenteric mass 2. History of MI/stent placement 3. CAD 4. Hypertension 5. Renal insufficiency 6. Hypothyroidism diagnosed with markedly elevated TSH and low T4 on 12/18/2018     Disposition: William Hancock appears stable.  He has been maintained on lanreotide since July.  There is no clinical evidence of disease progression.  The restaging CT is essentially unchanged.  I reviewed the CT images with a radiologist.  The larger liver lesions are better seen on the portal venous phase, but remain difficult to measure.  We do not have a baseline MRI for comparison.  It would also be difficult to compare the multiple liver metastases on a repeat dotatate.  We decided to recommend a repeat CT abdomen/pelvis in 3-4 months with a triple phase liver evaluation.  If this documents progression or if the chromogranin A is significantly higher I will recommend Lutathera.  Betsy Coder, MD  06/09/2019  10:03 AM

## 2019-06-09 NOTE — Addendum Note (Signed)
Addended by: Betsy Coder B on: 06/09/2019 10:25 AM   Modules accepted: Orders

## 2019-06-09 NOTE — Patient Instructions (Signed)
Lanreotide injection What is this medicine? LANREOTIDE (lan REE oh tide) is used to reduce blood levels of growth hormone in patients with a condition called acromegaly. It also works to slow or stop tumor growth in patients with neuroendocrine tumors and treat carcinoid syndrome. This medicine may be used for other purposes; ask your health care provider or pharmacist if you have questions. COMMON BRAND NAME(S): Somatuline [place] What should I tell my health care provider before I take this medicine? They need to know if you have any of these conditions:  diabetes  gallbladder disease  heart disease  kidney disease  liver disease  thyroid disease  an unusual or allergic reaction to lanreotide, other medicines, foods, dyes, or preservatives  pregnant or trying to get pregnant  breast-feeding How should I use this medicine? This medicine is for injection under the skin. It is given by a health care professional in a hospital or clinic setting. Contact your pediatrician or health care professional regarding the use of this medicine in children. Special care may be needed. Overdosage: If you think you have taken too much of this medicine contact a poison control center or emergency room at once. NOTE: This medicine is only for you. Do not share this medicine with others. What if I miss a dose? It is important not to miss your dose. Call your doctor or health care professional if you are unable to keep an appointment. What may interact with this medicine? This medicine may interact with the following medications:  bromocriptine  cyclosporine  certain medicines for blood pressure, heart disease, irregular heart beat  certain medicines for diabetes  quinidine  terfenadine This list may not describe all possible interactions. Give your health care provider a list of all the medicines, herbs, non-prescription drugs, or dietary supplements you use. Also tell them if you smoke,  drink alcohol, or use illegal drugs. Some items may interact with your medicine. What should I watch for while using this medicine? Tell your doctor or healthcare professional if your symptoms do not start to get better or if they get worse. Visit your doctor or health care professional for regular checks on your progress. Your condition will be monitored carefully while you are receiving this medicine. This medicine may increase blood sugar. Ask your healthcare provider if changes in diet or medicines are needed if you have diabetes. You may need blood work done while you are taking this medicine. Women should inform their doctor if they wish to become pregnant or think they might be pregnant. There is a potential for serious side effects to an unborn child. Talk to your health care professional or pharmacist for more information. Do not breast-feed an infant while taking this medicine or for 6 months after stopping it. This medicine has caused ovarian failure in some women. This medicine may interfere with the ability to have a child. Talk with your doctor or health care professional if you are concerned about your fertility. What side effects may I notice from receiving this medicine? Side effects that you should report to your doctor or health care professional as soon as possible:  allergic reactions like skin rash, itching or hives, swelling of the face, lips, or tongue  increased blood pressure  severe stomach pain  signs and symptoms of hgh blood sugar such as being more thirsty or hungry or having to urinate more than normal. You may also feel very tired or have blurry vision.  signs and symptoms of low blood   sugar such as feeling anxious; confusion; dizziness; increased hunger; unusually weak or tired; sweating; shakiness; cold; irritable; headache; blurred vision; fast heartbeat; loss of consciousness  unusually slow heartbeat Side effects that usually do not require medical  attention (report to your doctor or health care professional if they continue or are bothersome):  constipation  diarrhea  dizziness  headache  muscle pain  muscle spasms  nausea  pain, redness, or irritation at site where injected This list may not describe all possible side effects. Call your doctor for medical advice about side effects. You may report side effects to FDA at 1-800-FDA-1088. Where should I keep my medicine? This drug is given in a hospital or clinic and will not be stored at home. NOTE: This sheet is a summary. It may not cover all possible information. If you have questions about this medicine, talk to your doctor, pharmacist, or health care provider.  2020 Elsevier/Gold Standard (2018-02-27 09:13:08)  

## 2019-06-11 ENCOUNTER — Telehealth: Payer: Self-pay | Admitting: Oncology

## 2019-06-11 NOTE — Telephone Encounter (Signed)
I called patient regarding schedule he look mychart he would like earlier times going forward

## 2019-06-12 ENCOUNTER — Telehealth: Payer: Self-pay | Admitting: *Deleted

## 2019-06-12 NOTE — Telephone Encounter (Signed)
Notified of MD discussion with radiologist and plan for Dototate scan in April. This is when his next chromogranin level will be checked as well. Should hear from central scheduling in March.

## 2019-06-12 NOTE — Telephone Encounter (Signed)
-----   Message from Ladell Pier, MD sent at 06/09/2019  9:32 PM EST ----- Please call patient, reviewed Ct with radiologist , Dototate scan recommended as next comparison scan, I ordered for April

## 2019-06-24 ENCOUNTER — Other Ambulatory Visit: Payer: Self-pay | Admitting: Cardiology

## 2019-06-24 DIAGNOSIS — I2510 Atherosclerotic heart disease of native coronary artery without angina pectoris: Secondary | ICD-10-CM

## 2019-06-25 NOTE — Telephone Encounter (Signed)
Please advise 

## 2019-06-29 ENCOUNTER — Ambulatory Visit: Payer: Federal, State, Local not specified - PPO | Admitting: Cardiology

## 2019-06-29 ENCOUNTER — Encounter: Payer: Self-pay | Admitting: Cardiology

## 2019-06-29 ENCOUNTER — Other Ambulatory Visit: Payer: Self-pay

## 2019-06-29 VITALS — BP 135/90 | HR 71 | Temp 97.8°F | Ht 71.0 in | Wt 209.0 lb

## 2019-06-29 DIAGNOSIS — I2510 Atherosclerotic heart disease of native coronary artery without angina pectoris: Secondary | ICD-10-CM

## 2019-06-29 DIAGNOSIS — C7B8 Other secondary neuroendocrine tumors: Secondary | ICD-10-CM

## 2019-06-29 DIAGNOSIS — I10 Essential (primary) hypertension: Secondary | ICD-10-CM

## 2019-06-29 MED ORDER — TICAGRELOR 60 MG PO TABS: 60.0000 mg | ORAL_TABLET | Freq: Two times a day (BID) | ORAL | 3 refills | Status: DC

## 2019-06-29 NOTE — Progress Notes (Signed)
  Subjective:   William Hancock, male    DOB: 06/20/1965, 53 y.o.   MRN: [id number]   Chief complaint:  Coronary artery disease   HPI  53-year-old Caucasian male with hypertension, hyperlipidemia, coronary artery disease, s/p multivessel complex PCI (pro & mid RCA, prox-mid LAD/Diag bifurcation) for NSTEMI in 06/2018, metastatic neuroendocrine tumor  He denies chest pain, shortness of breath, palpitations, leg edema, orthopnea, PND, TIA/syncope. He has not been doing regular physical exercise through winter, but is starting back routine exercise. He has not had any bleeding issues while on DAPT.  Patient was diagnosed with metastatic neuroendocrine tumor (MET) in July 2020..  DAPT was interrupted for liver biopsy, without any cardiac adverse events. He appears to have a primary tumor in the small bowel with adjacent mesenteric adenopathy and extensive liver metastases. He is on treatment with lanreotide. While the therapy will not be curative, the goal of lanreotide is to slow disease progression. His onoclogist Dr. Sherrill will consider treatment with Lutathera, if there is disease progression. Currently, he is sable from MET standpoint.   Current Outpatient Medications on File Prior to Visit  Medication Sig Dispense Refill  . amLODipine (NORVASC) 5 MG tablet Take 1 tablet (5 mg total) by mouth daily. 90 tablet 0  . aspirin 81 MG chewable tablet Chew 1 tablet (81 mg total) by mouth daily. 90 tablet 0  . atorvastatin (LIPITOR) 40 MG tablet TAKE ONE TABLET BY MOUTH DAILY 17 tablet 2  . betamethasone dipropionate (DIPROLENE) 0.05 % cream Apply 1 application topically daily.    . levothyroxine (SYNTHROID) 125 MCG tablet Take 125 mcg by mouth daily.    . metoprolol succinate (TOPROL-XL) 50 MG 24 hr tablet Take 1 tablet (50 mg total) by mouth daily. 90 tablet 3  . nitroGLYCERIN (NITROSTAT) 0.4 MG SL tablet Place 1 tablet (0.4 mg total) under the tongue every 5 (five) minutes x 3 doses as needed  for chest pain. (Patient not taking: Reported on 06/29/2019) 25 tablet 1  . ticagrelor (BRILINTA) 90 MG TABS tablet Take 1 tablet (90 mg total) by mouth 2 (two) times daily. 60 tablet 3  . vitamin B-12 (CYANOCOBALAMIN) 500 MCG tablet Take 1,000 mcg by mouth daily.     No current facility-administered medications on file prior to visit.    Cardiovascular studies:  EKG 06/29/219: Sinus rhythm 63 bpm. Nonspecific ST-T changes.   Cath 06/09/2018: LM: Normal  LAD: Prox to mid LAD/Diag1 bifurcation severe calcific 80% stenosis        Diag 1 mid 75% stenosis        Atherectomy and prox LAD into Diag         Synergy DES 2.75 X 16 mm DES        Overlapping Stent into prox LAD Synergy DES 3.0 X 38 mm        Provisional stent mid LAD with minicrush technique Synergy DES 2.75 X 16 mm DES Ramus: 50 % proximal disease LCx: Mild luminal irregularities RCA: Stenting performed 06/06/2018        Severe mid 99% stenosis--->Orsero 3.5 X 26 mm Orsero DES         Post dilatation with 4.0 mm [place] balloon        Severe prox 80% stenosis--->Orsero 3.5 X 9 mm DES        Post dilatation with 4.0 mm [place] balloon   Recommendation: DAPT with Aspirin and brilinta for 1 year Aggressive risk factor modification  Hospital echocardiogram 06/06/2018: -   Left ventricle: The cavity size was normal. There was mild   concentric hypertrophy. Systolic function was normal. The   estimated ejection fraction was in the range of 55% to 60%. Wall   motion was normal; there were no regional wall motion   abnormalities. Doppler parameters are consistent with abnormal   left ventricular relaxation (grade 1 diastolic dysfunction). - No significant valvular abnormality.  Recent labs: 06/09/2019: Glucose 86, BUN/Cr 19/1.41. EGFR 56. Na/K 141/4.8. Rest of the CMP normal H/H 14/43. MCV 92. Platelets 154 TSH 2.6 normal  12/18/2018: Chol 104, TG 63, HDL 49, LDL 42  Review of Systems  Review of Systems  Cardiovascular: Negative  for chest pain, dyspnea on exertion, leg swelling, palpitations and syncope.         Vitals:   06/29/19 0924  BP: 135/90  Pulse: 71  Temp: 97.8 F (36.6 C)  SpO2: 99%     Objective:   Physical Exam  Physical Exam  Constitutional: He appears well-developed and well-nourished.  Neck: No JVD present.  Cardiovascular: Normal rate, regular rhythm, normal heart sounds and intact distal pulses.  No murmur heard. Pulmonary/Chest: Effort normal and breath sounds normal. He has no wheezes. He has no rales.  Musculoskeletal:        General: No edema.  Nursing note and vitals reviewed.          Assessment & Recommendations:   55 year old Caucasian male with hypertension, hyperlipidemia, coronary artery disease, s/p multivessel complex PCI (pro & mid RCA, prox-mid LAD/Diag bifurcation) for NSTEMI in 06/2018, metastatic neuroendocrine tumor   CAD: S/p multilvessel PCI for NSTEMI 06/2018 Doing well without angina symptoms. EKG today is unremarkable. In absence of bleeding, and history of complex stenting, recommended extended duration of dual antiplatelet therapy with Aspirin 81 mg daily, and Brilinta 60 mg bid. Brilinta can be interrupted at any time for any future biopsies/procedures, or any bleeding issues.  Continue metoprolol succinate 50 mg daily, amlodipine 5 mg daily, lipitor 40 mg daily. Continue lipitor 40 mg daily. Favorable lipid panel in 12/2018. Repeat in 12/2019.  Hypertension: Well controlled.  Metastatic neuroendocrine tumor: On lanreotide, managed by Dr. Benay Spice.   Patient is now 1 year out of his NSTEMI and multivessel coronary stenting. He is not having any angina symptoms. His EKG today is unremarkable. Vitals and bloodwork parameters are optimal. 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 I will be available for any further questions regarding his cardiac condition and fitness.  F/u in 6 months.    Nigel Mormon, MD [facility] Pager: [phone] Office: [phone] If no answer Cell [phone]

## 2019-07-10 ENCOUNTER — Inpatient Hospital Stay: Payer: Federal, State, Local not specified - PPO | Attending: Nurse Practitioner

## 2019-07-10 ENCOUNTER — Other Ambulatory Visit: Payer: Self-pay

## 2019-07-10 VITALS — BP 132/78 | HR 72 | Temp 98.2°F | Resp 18

## 2019-07-10 DIAGNOSIS — C7A098 Malignant carcinoid tumors of other sites: Secondary | ICD-10-CM | POA: Diagnosis not present

## 2019-07-10 DIAGNOSIS — C7B02 Secondary carcinoid tumors of liver: Secondary | ICD-10-CM | POA: Insufficient documentation

## 2019-07-10 DIAGNOSIS — D3A098 Benign carcinoid tumors of other sites: Secondary | ICD-10-CM

## 2019-07-10 MED ORDER — AMLODIPINE BESYLATE 5 MG PO TABS: 5.0000 mg | ORAL_TABLET | Freq: Every day | ORAL | 1 refills | Status: DC

## 2019-07-10 MED ORDER — LANREOTIDE ACETATE 120 MG/0.5ML ~~LOC~~ SOLN
120.0000 mg | Freq: Once | SUBCUTANEOUS | Status: AC
  Administered 2019-07-10: 10:00:00 120 mg via SUBCUTANEOUS
  Filled 2019-07-10: qty 120

## 2019-07-10 MED ORDER — ASPIRIN 81 MG PO CHEW: 81.0000 mg | CHEWABLE_TABLET | Freq: Every day | ORAL | 1 refills | Status: DC

## 2019-07-10 NOTE — Patient Instructions (Signed)
Lanreotide injection What is this medicine? LANREOTIDE (lan REE oh tide) is used to reduce blood levels of growth hormone in patients with a condition called acromegaly. It also works to slow or stop tumor growth in patients with neuroendocrine tumors and treat carcinoid syndrome. This medicine may be used for other purposes; ask your health care provider or pharmacist if you have questions. COMMON BRAND NAME(S): Somatuline [place] What should I tell my health care provider before I take this medicine? They need to know if you have any of these conditions:  diabetes  gallbladder disease  heart disease  kidney disease  liver disease  thyroid disease  an unusual or allergic reaction to lanreotide, other medicines, foods, dyes, or preservatives  pregnant or trying to get pregnant  breast-feeding How should I use this medicine? This medicine is for injection under the skin. It is given by a health care professional in a hospital or clinic setting. Contact your pediatrician or health care professional regarding the use of this medicine in children. Special care may be needed. Overdosage: If you think you have taken too much of this medicine contact a poison control center or emergency room at once. NOTE: This medicine is only for you. Do not share this medicine with others. What if I miss a dose? It is important not to miss your dose. Call your doctor or health care professional if you are unable to keep an appointment. What may interact with this medicine? This medicine may interact with the following medications:  bromocriptine  cyclosporine  certain medicines for blood pressure, heart disease, irregular heart beat  certain medicines for diabetes  quinidine  terfenadine This list may not describe all possible interactions. Give your health care provider a list of all the medicines, herbs, non-prescription drugs, or dietary supplements you use. Also tell them if you smoke,  drink alcohol, or use illegal drugs. Some items may interact with your medicine. What should I watch for while using this medicine? Tell your doctor or healthcare professional if your symptoms do not start to get better or if they get worse. Visit your doctor or health care professional for regular checks on your progress. Your condition will be monitored carefully while you are receiving this medicine. This medicine may increase blood sugar. Ask your healthcare provider if changes in diet or medicines are needed if you have diabetes. You may need blood work done while you are taking this medicine. Women should inform their doctor if they wish to become pregnant or think they might be pregnant. There is a potential for serious side effects to an unborn child. Talk to your health care professional or pharmacist for more information. Do not breast-feed an infant while taking this medicine or for 6 months after stopping it. This medicine has caused ovarian failure in some women. This medicine may interfere with the ability to have a child. Talk with your doctor or health care professional if you are concerned about your fertility. What side effects may I notice from receiving this medicine? Side effects that you should report to your doctor or health care professional as soon as possible:  allergic reactions like skin rash, itching or hives, swelling of the face, lips, or tongue  increased blood pressure  severe stomach pain  signs and symptoms of hgh blood sugar such as being more thirsty or hungry or having to urinate more than normal. You may also feel very tired or have blurry vision.  signs and symptoms of low blood   sugar such as feeling anxious; confusion; dizziness; increased hunger; unusually weak or tired; sweating; shakiness; cold; irritable; headache; blurred vision; fast heartbeat; loss of consciousness  unusually slow heartbeat Side effects that usually do not require medical  attention (report to your doctor or health care professional if they continue or are bothersome):  constipation  diarrhea  dizziness  headache  muscle pain  muscle spasms  nausea  pain, redness, or irritation at site where injected This list may not describe all possible side effects. Call your doctor for medical advice about side effects. You may report side effects to FDA at 1-800-FDA-1088. Where should I keep my medicine? This drug is given in a hospital or clinic and will not be stored at home. NOTE: This sheet is a summary. It may not cover all possible information. If you have questions about this medicine, talk to your doctor, pharmacist, or health care provider.  2020 Elsevier/Gold Standard (2018-02-27 09:13:08)  

## 2019-07-12 ENCOUNTER — Encounter: Payer: Self-pay | Admitting: Oncology

## 2019-07-13 ENCOUNTER — Other Ambulatory Visit: Payer: Self-pay | Admitting: Cardiology

## 2019-07-13 DIAGNOSIS — I2510 Atherosclerotic heart disease of native coronary artery without angina pectoris: Secondary | ICD-10-CM

## 2019-07-13 MED ORDER — METOPROLOL SUCCINATE ER 50 MG PO TB24: 50.0000 mg | ORAL_TABLET | Freq: Every day | ORAL | 3 refills | Status: DC

## 2019-07-13 NOTE — Telephone Encounter (Signed)
Please read thank you

## 2019-07-15 ENCOUNTER — Other Ambulatory Visit: Payer: Self-pay

## 2019-07-15 DIAGNOSIS — D485 Neoplasm of uncertain behavior of skin: Secondary | ICD-10-CM | POA: Diagnosis not present

## 2019-07-15 DIAGNOSIS — I2510 Atherosclerotic heart disease of native coronary artery without angina pectoris: Secondary | ICD-10-CM

## 2019-07-15 DIAGNOSIS — L905 Scar conditions and fibrosis of skin: Secondary | ICD-10-CM | POA: Diagnosis not present

## 2019-07-15 MED ORDER — METOPROLOL TARTRATE 25 MG PO TABS: 25.0000 mg | ORAL_TABLET | Freq: Two times a day (BID) | ORAL | 3 refills | Status: DC

## 2019-07-15 MED ORDER — METOPROLOL SUCCINATE ER 25 MG PO TB24: 25.0000 mg | ORAL_TABLET | Freq: Two times a day (BID) | ORAL | 1 refills | Status: DC

## 2019-07-16 ENCOUNTER — Telehealth: Payer: Self-pay | Admitting: Oncology

## 2019-07-16 NOTE — Telephone Encounter (Signed)
R/s appt per 2/8 sch message - unable to reach pt . Left message with new appt date and time

## 2019-08-07 ENCOUNTER — Other Ambulatory Visit: Payer: Self-pay

## 2019-08-07 ENCOUNTER — Inpatient Hospital Stay: Payer: Federal, State, Local not specified - PPO | Attending: Nurse Practitioner

## 2019-08-07 VITALS — BP 128/72 | HR 72 | Temp 98.2°F | Resp 18

## 2019-08-07 DIAGNOSIS — C7A098 Malignant carcinoid tumors of other sites: Secondary | ICD-10-CM | POA: Diagnosis not present

## 2019-08-07 DIAGNOSIS — D3A098 Benign carcinoid tumors of other sites: Secondary | ICD-10-CM

## 2019-08-07 DIAGNOSIS — C7B02 Secondary carcinoid tumors of liver: Secondary | ICD-10-CM | POA: Insufficient documentation

## 2019-08-07 MED ORDER — LANREOTIDE ACETATE 120 MG/0.5ML ~~LOC~~ SOLN
120.0000 mg | Freq: Once | SUBCUTANEOUS | Status: AC
  Administered 2019-08-07: 120 mg via SUBCUTANEOUS
  Filled 2019-08-07: qty 120

## 2019-08-07 NOTE — Patient Instructions (Signed)
Lanreotide injection What is this medicine? LANREOTIDE (lan REE oh tide) is used to reduce blood levels of growth hormone in patients with a condition called acromegaly. It also works to slow or stop tumor growth in patients with neuroendocrine tumors and treat carcinoid syndrome. This medicine may be used for other purposes; ask your health care provider or pharmacist if you have questions. COMMON BRAND NAME(S): Somatuline [place] What should I tell my health care provider before I take this medicine? They need to know if you have any of these conditions:  diabetes  gallbladder disease  heart disease  kidney disease  liver disease  thyroid disease  an unusual or allergic reaction to lanreotide, other medicines, foods, dyes, or preservatives  pregnant or trying to get pregnant  breast-feeding How should I use this medicine? This medicine is for injection under the skin. It is given by a health care professional in a hospital or clinic setting. Contact your pediatrician or health care professional regarding the use of this medicine in children. Special care may be needed. Overdosage: If you think you have taken too much of this medicine contact a poison control center or emergency room at once. NOTE: This medicine is only for you. Do not share this medicine with others. What if I miss a dose? It is important not to miss your dose. Call your doctor or health care professional if you are unable to keep an appointment. What may interact with this medicine? This medicine may interact with the following medications:  bromocriptine  cyclosporine  certain medicines for blood pressure, heart disease, irregular heart beat  certain medicines for diabetes  quinidine  terfenadine This list may not describe all possible interactions. Give your health care provider a list of all the medicines, herbs, non-prescription drugs, or dietary supplements you use. Also tell them if you smoke,  drink alcohol, or use illegal drugs. Some items may interact with your medicine. What should I watch for while using this medicine? Tell your doctor or healthcare professional if your symptoms do not start to get better or if they get worse. Visit your doctor or health care professional for regular checks on your progress. Your condition will be monitored carefully while you are receiving this medicine. This medicine may increase blood sugar. Ask your healthcare provider if changes in diet or medicines are needed if you have diabetes. You may need blood work done while you are taking this medicine. Women should inform their doctor if they wish to become pregnant or think they might be pregnant. There is a potential for serious side effects to an unborn child. Talk to your health care professional or pharmacist for more information. Do not breast-feed an infant while taking this medicine or for 6 months after stopping it. This medicine has caused ovarian failure in some women. This medicine may interfere with the ability to have a child. Talk with your doctor or health care professional if you are concerned about your fertility. What side effects may I notice from receiving this medicine? Side effects that you should report to your doctor or health care professional as soon as possible:  allergic reactions like skin rash, itching or hives, swelling of the face, lips, or tongue  increased blood pressure  severe stomach pain  signs and symptoms of hgh blood sugar such as being more thirsty or hungry or having to urinate more than normal. You may also feel very tired or have blurry vision.  signs and symptoms of low blood   sugar such as feeling anxious; confusion; dizziness; increased hunger; unusually weak or tired; sweating; shakiness; cold; irritable; headache; blurred vision; fast heartbeat; loss of consciousness  unusually slow heartbeat Side effects that usually do not require medical  attention (report to your doctor or health care professional if they continue or are bothersome):  constipation  diarrhea  dizziness  headache  muscle pain  muscle spasms  nausea  pain, redness, or irritation at site where injected This list may not describe all possible side effects. Call your doctor for medical advice about side effects. You may report side effects to FDA at 1-800-FDA-1088. Where should I keep my medicine? This drug is given in a hospital or clinic and will not be stored at home. NOTE: This sheet is a summary. It may not cover all possible information. If you have questions about this medicine, talk to your doctor, pharmacist, or health care provider.  2020 Elsevier/Gold Standard (2018-02-27 09:13:08)  

## 2019-09-04 ENCOUNTER — Other Ambulatory Visit: Payer: Self-pay

## 2019-09-04 ENCOUNTER — Ambulatory Visit (HOSPITAL_COMMUNITY)
Admission: RE | Admit: 2019-09-04 | Discharge: 2019-09-04 | Disposition: A | Discharge status: Home / Self Care | Payer: Federal, State, Local not specified - PPO | Source: Ambulatory Visit | Attending: Oncology | Admitting: Oncology

## 2019-09-04 DIAGNOSIS — C7B8 Other secondary neuroendocrine tumors: Secondary | ICD-10-CM | POA: Diagnosis not present

## 2019-09-04 DIAGNOSIS — C787 Secondary malignant neoplasm of liver and intrahepatic bile duct: Secondary | ICD-10-CM | POA: Diagnosis not present

## 2019-09-04 DIAGNOSIS — D3A098 Benign carcinoid tumors of other sites: Secondary | ICD-10-CM | POA: Diagnosis not present

## 2019-09-04 IMAGING — CT NM PET SKULL BASE TO THIGH
1 of 9 series · 1 of 25 positions shown · non-contrast
Comparison: CT [DATE], DOTATATE PET-CT scan [DATE] with

CLINICAL DATA: Metastatic well differentiated neuroendocrine tumor.
Liver metastasis. Monthly Sandostatin injections ongoing

EXAM:
NUCLEAR MEDICINE PET SKULL BASE TO THIGH
TECHNIQUE: 5.3 mCi Ga 68 DOTATATE was injected intravenously. Full-ring PET
imaging was performed from the skull base to thigh after the
radiotracer. CT data was obtained and used for attenuation
correction and anatomic localization.

[Series 4: ct sk_thigh 5.0 b31f · axial · 5.0mm · 0.98mm/px · 1 of 242 slices shown]
[im 242/242  brain]
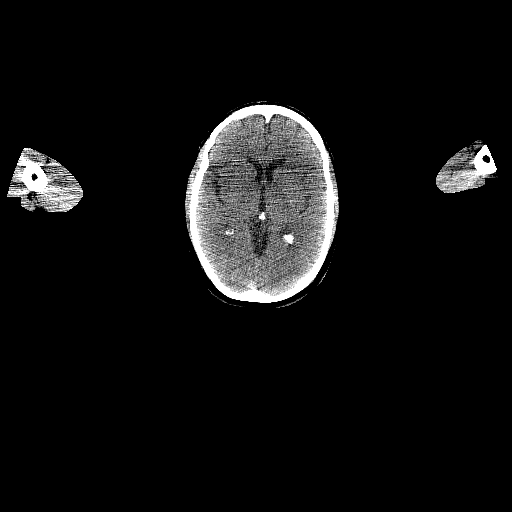

[1 of 25 positions shown; findings below may reference images not displayed]

FINDINGS: NECK

No radiotracer activity in neck lymph nodes. Diffuse uptake in the
thyroid gland is again noted in favored benign.

Incidental CT findings: None

CHEST

No radiotracer accumulation within mediastinal or hilar lymph nodes.
No suspicious pulmonary nodules on the CT scan.

Mild activity in RIGHT hilar axillary lymph nodes related to COVID
vaccine injection.

Incidental CT finding:None

ABDOMEN/PELVIS

Multiple lesions again demonstrated within liver which have intense
radiotracer activity. Lesions are near confluent in the LEFT and
RIGHT hepatic lobe. The degree of confluency/size of lesions is
overall increased from comparison exam. For example lesion in the
central RIGHT hepatic lobe measuring 5.4 cm (image 106/4) compares
to 4.3 cm on PET-CT [DATE]. Lesion in the more inferior RIGHT
hepatic lobe (image 118/4) measures 4.1 cm compared to 3.6 cm.
Lesion in the posterior RIGHT hepatic lobe measures 3.9 cm (image
124/4) compared to 2.4 cm.

Lesions have similar to slightly decreased radiotracer activity. For
example lesion inferior RIGHT hepatic lobe SUV max equal
comparison [DATE] (image 138). Large lesion in the central RIGHT
hepatic lobe with SUV max equal 24.2 comparison SUV max equal 30
(image 111). Lesion LEFT hepatic lobe SUV max equal 21.7 compared to
SUV max equal

Within the central mesentery there is a partially calcified mass
measuring 3.1 x 2.9 cm compared with 3.2 x 2.8 cm. Lesion has
intense radiotracer activity with SUV max equal 11.5 compared to
18.3. Adjacent to the central mesenteric mass there is a focus of
radiotracer activity which appears to be associated with the small
bowel with SUV max equal 9.3 compared SUV max equal 14.8. This
lesion is difficult define on the CT portion of exam.

No new lesions are present in the abdomen pelvis.

Physiologic activity noted in the liver, spleen, adrenal glands and
kidneys.

Incidental CT findings:No bowel obstruction. Nonobstructing LEFT
renal calculus.

SKELETON

No focal activity to suggest skeletal metastasis.

Incidental CT findings:None
IMPRESSION: 1. Interval increase in size of hepatic lesions in the LEFT and
RIGHT hepatic lobe which reach near confluence. Overall radiotracer
activity is similar to slightly decreased from comparison exam.
2. Stable size and radiotracer activity of central mesenteric mass.
3. Lesion presumably localizing to the small bowel adjacent to the
mesenteric mass is mildly decreased in activity. No clear lesion
identified on CT portion exam.
4. No evidence of new metastatic lesions.

## 2019-09-04 MED ORDER — GALLIUM GA 68 DOTATATE IV KIT
5.3000 | PACK | Freq: Once | INTRAVENOUS | Status: AC
  Administered 2019-09-04: 15:00:00 5.3 via INTRAVENOUS

## 2019-09-07 ENCOUNTER — Ambulatory Visit: Payer: Federal, State, Local not specified - PPO

## 2019-09-07 ENCOUNTER — Other Ambulatory Visit: Payer: Federal, State, Local not specified - PPO

## 2019-09-11 ENCOUNTER — Other Ambulatory Visit: Payer: Self-pay | Admitting: *Deleted

## 2019-09-11 DIAGNOSIS — D3A098 Benign carcinoid tumors of other sites: Secondary | ICD-10-CM

## 2019-09-14 ENCOUNTER — Encounter: Payer: Self-pay | Admitting: Oncology

## 2019-09-14 ENCOUNTER — Other Ambulatory Visit: Payer: Federal, State, Local not specified - PPO

## 2019-09-14 ENCOUNTER — Ambulatory Visit: Payer: Federal, State, Local not specified - PPO

## 2019-09-15 ENCOUNTER — Inpatient Hospital Stay: Payer: Federal, State, Local not specified - PPO | Admitting: Oncology

## 2019-09-15 ENCOUNTER — Inpatient Hospital Stay: Payer: Federal, State, Local not specified - PPO

## 2019-09-15 ENCOUNTER — Inpatient Hospital Stay: Payer: Federal, State, Local not specified - PPO | Attending: Nurse Practitioner

## 2019-09-15 ENCOUNTER — Other Ambulatory Visit: Payer: Self-pay

## 2019-09-15 VITALS — BP 128/92 | HR 54 | Temp 98.5°F | Resp 18

## 2019-09-15 VITALS — BP 152/98 | HR 60 | Temp 98.5°F | Resp 17 | Ht 71.0 in | Wt 211.4 lb

## 2019-09-15 DIAGNOSIS — N289 Disorder of kidney and ureter, unspecified: Secondary | ICD-10-CM | POA: Insufficient documentation

## 2019-09-15 DIAGNOSIS — C7B02 Secondary carcinoid tumors of liver: Secondary | ICD-10-CM | POA: Insufficient documentation

## 2019-09-15 DIAGNOSIS — Z79899 Other long term (current) drug therapy: Secondary | ICD-10-CM | POA: Diagnosis not present

## 2019-09-15 DIAGNOSIS — I10 Essential (primary) hypertension: Secondary | ICD-10-CM | POA: Insufficient documentation

## 2019-09-15 DIAGNOSIS — E039 Hypothyroidism, unspecified: Secondary | ICD-10-CM | POA: Insufficient documentation

## 2019-09-15 DIAGNOSIS — D3A098 Benign carcinoid tumors of other sites: Secondary | ICD-10-CM | POA: Diagnosis not present

## 2019-09-15 DIAGNOSIS — R197 Diarrhea, unspecified: Secondary | ICD-10-CM | POA: Insufficient documentation

## 2019-09-15 DIAGNOSIS — C7A098 Malignant carcinoid tumors of other sites: Secondary | ICD-10-CM | POA: Insufficient documentation

## 2019-09-15 DIAGNOSIS — I252 Old myocardial infarction: Secondary | ICD-10-CM | POA: Diagnosis not present

## 2019-09-15 DIAGNOSIS — C7A1 Malignant poorly differentiated neuroendocrine tumors: Secondary | ICD-10-CM | POA: Diagnosis not present

## 2019-09-15 LAB — CBC WITH DIFFERENTIAL (CANCER CENTER ONLY)
Abs Immature Granulocytes: 0.01 10*3/uL (ref 0.00–0.07)
Basophils Absolute: 0.1 10*3/uL (ref 0.0–0.1)
Basophils Relative: 1 %
Eosinophils Absolute: 0.5 10*3/uL (ref 0.0–0.5)
Eosinophils Relative: 7 %
HCT: 43.6 % (ref 39.0–52.0)
Hemoglobin: 15.1 g/dL (ref 13.0–17.0)
Immature Granulocytes: 0 %
Lymphocytes Relative: 20 %
Lymphs Abs: 1.2 10*3/uL (ref 0.7–4.0)
MCH: 30.4 pg (ref 26.0–34.0)
MCHC: 34.6 g/dL (ref 30.0–36.0)
MCV: 87.7 fL (ref 80.0–100.0)
Monocytes Absolute: 0.5 10*3/uL (ref 0.1–1.0)
Monocytes Relative: 9 %
Neutro Abs: 4 10*3/uL (ref 1.7–7.7)
Neutrophils Relative %: 63 %
Platelet Count: 207 10*3/uL (ref 150–400)
RBC: 4.97 MIL/uL (ref 4.22–5.81)
RDW: 12.4 % (ref 11.5–15.5)
WBC Count: 6.3 10*3/uL (ref 4.0–10.5)
nRBC: 0 % (ref 0.0–0.2)

## 2019-09-15 LAB — CMP (CANCER CENTER ONLY)
ALT: 34 U/L (ref 0–44)
AST: 25 U/L (ref 15–41)
Albumin: 3.8 g/dL (ref 3.5–5.0)
Alkaline Phosphatase: 95 U/L (ref 38–126)
Anion gap: 9 (ref 5–15)
BUN: 15 mg/dL (ref 6–20)
CO2: 23 mmol/L (ref 22–32)
Calcium: 9.4 mg/dL (ref 8.9–10.3)
Chloride: 110 mmol/L (ref 98–111)
Creatinine: 1.41 mg/dL — ABNORMAL HIGH (ref 0.61–1.24)
GFR, Est AFR Am: >60 mL/min (ref >60)
GFR, Estimated: 55 mL/min — ABNORMAL LOW (ref >60)
Glucose, Bld: 130 mg/dL — ABNORMAL HIGH (ref 70–99)
Potassium: 4.1 mmol/L (ref 3.5–5.1)
Sodium: 142 mmol/L (ref 135–145)
Total Bilirubin: 1.1 mg/dL (ref 0.3–1.2)
Total Protein: 6.6 g/dL (ref 6.5–8.1)

## 2019-09-15 MED ORDER — LANREOTIDE ACETATE 120 MG/0.5ML ~~LOC~~ SOLN
120.0000 mg | Freq: Once | SUBCUTANEOUS | Status: AC
  Administered 2019-09-15: 120 mg via SUBCUTANEOUS
  Filled 2019-09-15: qty 120

## 2019-09-15 NOTE — Progress Notes (Signed)
  [facility] OFFICE PROGRESS NOTE   Diagnosis: Carcinoid tumor  INTERVAL HISTORY:   William Hancock returns as scheduled.  He feels well.  Good appetite and energy level.  He continues monthly lanreotide.  He reports intermittent loose stool.  No other complaint.  He is working.  Objective:  Vital signs in last 24 hours:  Blood pressure (!) 152/98, pulse 60, temperature 98.5 F (36.9 C), temperature source Oral, resp. rate 17, height 5' 11" (1.803 m), weight 211 lb 6.4 oz (95.9 kg), SpO2 100 %.    Resp: Lungs clear bilaterally Cardio: Regular rate and rhythm GI: Indistinct liver edge a few fingers below the right costal margin, no splenomegaly nontender, no mass Vascular: No leg edema  Skin: Healing zoster rash at the left buttock    Lab Results:  Lab Results  Component Value Date   WBC 6.3 09/15/2019   HGB 15.1 09/15/2019   HCT 43.6 09/15/2019   MCV 87.7 09/15/2019   PLT 207 09/15/2019   NEUTROABS 4.0 09/15/2019    CMP  Lab Results  Component Value Date   NA 141 06/09/2019   K 4.8 06/09/2019   CL 107 06/09/2019   CO2 26 06/09/2019   GLUCOSE 86 06/09/2019   BUN 19 06/09/2019   CREATININE 1.41 (H) 06/09/2019   CALCIUM 9.5 06/09/2019   PROT 7.1 06/09/2019   ALBUMIN 4.3 06/09/2019   AST 28 06/09/2019   ALT 37 06/09/2019   ALKPHOS 82 06/09/2019   BILITOT 1.1 06/09/2019   GFRNONAA 56 (L) 06/09/2019   GFRAA >60 06/09/2019     Medications: I have reviewed the patient's current medications.   Assessment/Plan: 1. Metastatic neuroendocrine tumor, well-differentiated  10/31/2018 Abdominal ultrasound-numerous nearly confluent heterogeneous masses measuring up to 3.5 cm.    11/04/2018 CT abdomen/pelvis-multiple enhancing hepatic lesions and a central partially calcified spiculated mesenteric mass measuring 3.1 x 3.1 cm.    11/13/2018 biopsy liver lesion-metastatic well-differentiated neuroendocrine tumor positive for CKAE1-AE3, synaptophysin,  chromogranin, CD56 and CDX-2; Ki-67 labeling index less than 3%; TTF-1 negative  Netspot 12/12/2018- increased activity associated a lobular mesenteric mass, small bowel medial to the mesenteric mass, and multiple liver lesions  Elevated chromogranin A level and 24-hour urine 5 HIAA  Monthly lanreotide beginning 12/23/2018  CT 05/13/2019-multiple large hepatic metastases on the dotatate are poorly visualized, several small lesions are decreased in size 1 new lesion, stable mesenteric mass  Netspot 09/04/2019-increase in size of left and right liver lesions, stable to slightly decreased radiotracer accumulation, stable central mesenteric mass, no new metastatic lesions 2. History of MI/stent placement 3. CAD 4. Hypertension 5. Renal insufficiency 6. Hypothyroidism diagnosed with markedly elevated TSH and low T4 on 12/18/2018       Disposition: William Hancock appears unchanged.  The Netspot study reveals enlargement of several liver lesions.  I reviewed the imaging with Dr. Leonia Reeves.  William Hancock is a candidate to proceed with Lutathera.  We reviewed potential toxicities associated with Lutathera including the chance of nausea and hematologic toxicity.  He will review this further with Dr. Leonia Reeves.  We discussed the expected disease-free survival benefit with Lutathera treatment.  He agrees to proceed.   He will continue lanreotide at the Cancer center.  We will follow up on the chromogranin A level from today.  William Hancock will return to the Cancer center for an office visit, labs, and lanreotide in 8 weeks.  Betsy Coder, MD  09/15/2019  8:35 AM

## 2019-09-15 NOTE — Patient Instructions (Signed)
Lanreotide injection What is this medicine? LANREOTIDE (lan REE oh tide) is used to reduce blood levels of growth hormone in patients with a condition called acromegaly. It also works to slow or stop tumor growth in patients with neuroendocrine tumors and treat carcinoid syndrome. This medicine may be used for other purposes; ask your health care provider or pharmacist if you have questions. COMMON BRAND NAME(S): Somatuline [place] What should I tell my health care provider before I take this medicine? They need to know if you have any of these conditions:  diabetes  gallbladder disease  heart disease  kidney disease  liver disease  thyroid disease  an unusual or allergic reaction to lanreotide, other medicines, foods, dyes, or preservatives  pregnant or trying to get pregnant  breast-feeding How should I use this medicine? This medicine is for injection under the skin. It is given by a health care professional in a hospital or clinic setting. Contact your pediatrician or health care professional regarding the use of this medicine in children. Special care may be needed. Overdosage: If you think you have taken too much of this medicine contact a poison control center or emergency room at once. NOTE: This medicine is only for you. Do not share this medicine with others. What if I miss a dose? It is important not to miss your dose. Call your doctor or health care professional if you are unable to keep an appointment. What may interact with this medicine? This medicine may interact with the following medications:  bromocriptine  cyclosporine  certain medicines for blood pressure, heart disease, irregular heart beat  certain medicines for diabetes  quinidine  terfenadine This list may not describe all possible interactions. Give your health care provider a list of all the medicines, herbs, non-prescription drugs, or dietary supplements you use. Also tell them if you smoke,  drink alcohol, or use illegal drugs. Some items may interact with your medicine. What should I watch for while using this medicine? Tell your doctor or healthcare professional if your symptoms do not start to get better or if they get worse. Visit your doctor or health care professional for regular checks on your progress. Your condition will be monitored carefully while you are receiving this medicine. This medicine may increase blood sugar. Ask your healthcare provider if changes in diet or medicines are needed if you have diabetes. You may need blood work done while you are taking this medicine. Women should inform their doctor if they wish to become pregnant or think they might be pregnant. There is a potential for serious side effects to an unborn child. Talk to your health care professional or pharmacist for more information. Do not breast-feed an infant while taking this medicine or for 6 months after stopping it. This medicine has caused ovarian failure in some women. This medicine may interfere with the ability to have a child. Talk with your doctor or health care professional if you are concerned about your fertility. What side effects may I notice from receiving this medicine? Side effects that you should report to your doctor or health care professional as soon as possible:  allergic reactions like skin rash, itching or hives, swelling of the face, lips, or tongue  increased blood pressure  severe stomach pain  signs and symptoms of hgh blood sugar such as being more thirsty or hungry or having to urinate more than normal. You may also feel very tired or have blurry vision.  signs and symptoms of low blood   sugar such as feeling anxious; confusion; dizziness; increased hunger; unusually weak or tired; sweating; shakiness; cold; irritable; headache; blurred vision; fast heartbeat; loss of consciousness  unusually slow heartbeat Side effects that usually do not require medical  attention (report to your doctor or health care professional if they continue or are bothersome):  constipation  diarrhea  dizziness  headache  muscle pain  muscle spasms  nausea  pain, redness, or irritation at site where injected This list may not describe all possible side effects. Call your doctor for medical advice about side effects. You may report side effects to FDA at 1-800-FDA-1088. Where should I keep my medicine? This drug is given in a hospital or clinic and will not be stored at home. NOTE: This sheet is a summary. It may not cover all possible information. If you have questions about this medicine, talk to your doctor, pharmacist, or health care provider.  2020 Elsevier/Gold Standard (2018-02-27 09:13:08)  

## 2019-09-16 ENCOUNTER — Telehealth: Payer: Self-pay | Admitting: Oncology

## 2019-09-16 ENCOUNTER — Encounter: Payer: Self-pay | Admitting: Oncology

## 2019-09-16 LAB — CHROMOGRANIN A: Chromogranin A (ng/mL): 3641 ng/mL — ABNORMAL HIGH (ref 0.0–101.8)

## 2019-09-16 NOTE — Telephone Encounter (Signed)
Scheduled per los. Called and left msg. Mailed printout  °

## 2019-09-21 ENCOUNTER — Other Ambulatory Visit: Payer: Self-pay | Admitting: Cardiology

## 2019-09-21 ENCOUNTER — Other Ambulatory Visit (HOSPITAL_COMMUNITY): Payer: Self-pay | Admitting: Oncology

## 2019-09-21 DIAGNOSIS — I2510 Atherosclerotic heart disease of native coronary artery without angina pectoris: Secondary | ICD-10-CM

## 2019-09-21 DIAGNOSIS — D3A098 Benign carcinoid tumors of other sites: Secondary | ICD-10-CM

## 2019-09-22 ENCOUNTER — Encounter: Payer: Self-pay | Admitting: *Deleted

## 2019-09-22 NOTE — Progress Notes (Signed)
Received medical POA and Living Will. Took document to HIM to be scanned.

## 2019-09-23 ENCOUNTER — Other Ambulatory Visit (HOSPITAL_COMMUNITY): Payer: Self-pay | Admitting: Oncology

## 2019-09-23 DIAGNOSIS — D3A098 Benign carcinoid tumors of other sites: Secondary | ICD-10-CM

## 2019-09-24 ENCOUNTER — Ambulatory Visit (HOSPITAL_COMMUNITY)
Admission: RE | Admit: 2019-09-24 | Discharge: 2019-09-24 | Disposition: A | Discharge status: Home / Self Care | Payer: Federal, State, Local not specified - PPO | Source: Ambulatory Visit | Attending: Oncology | Admitting: Oncology

## 2019-09-24 ENCOUNTER — Other Ambulatory Visit: Payer: Self-pay

## 2019-09-24 DIAGNOSIS — C7B02 Secondary carcinoid tumors of liver: Secondary | ICD-10-CM | POA: Diagnosis not present

## 2019-09-24 DIAGNOSIS — C7A1 Malignant poorly differentiated neuroendocrine tumors: Secondary | ICD-10-CM | POA: Diagnosis not present

## 2019-09-24 DIAGNOSIS — D3A098 Benign carcinoid tumors of other sites: Secondary | ICD-10-CM | POA: Insufficient documentation

## 2019-09-24 IMAGING — NM NM RADIOLOGIST EVAL AND MGMT
1 series · 1 of 1 positions shown · non-contrast
Comparison: none

[Series 1: static · 2.07mm/px · 1 of 1 slices shown]
[im 1/1]
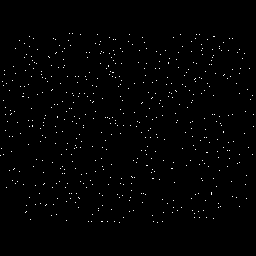

[1 of 1 positions shown; findings below may reference images not displayed]

EXAM:
NEW PATIENT OFFICE VISIT

CHIEF COMPLAINT:
Well differentiated neuroendocrine tumor metastatic to the liver.
Initial diagnosis in [DATE] upon the discovery of multiple
hepatic metastasis. Patient had experienced bloating and diarrhea no
prompted ultrasound in subsequent CT. Biopsy of liver lesion
demonstrated well differentiated neuroendocrine tumor. [8X]
labeling index less than 3%;

Subsequent DOTATATE PET scan of [DATE] demonstrated multiple
foci of well differentiated neuroendocrine tumor within the liver as
well as a central mesenteric mass and potential small nodule in the
small bowel.

Subsequently patient demonstrates evidence of progression with
increase in size of liver metastasis and increasing chromogranin
level currently greater than 3,000

Current Pain Level: 1-10

HISTORY OF PRESENT ILLNESS:
See [REDACTED] note

REVIEW OF SYSTEMS:
See [REDACTED] note

PHYSICAL EXAMINATION:
See [REDACTED] note

ASSESSMENT AND PLAN:
1. Well differentiated neuroendocrine tumor with multiple hepatic
metastasis. Patient appears to be a good candidate for peptide
receptor radiotherapy. Patient has multiple somatostatin receptor
avid metastasis identified by gallium 68 DOTATATE PET scan. The
uptake is intense for size of tumor which is a good indication for
efficacy treatment.

Patient was advise of risks and benefits of procedure. Primary
benefit being prolongation of progression free survival. Primary
risk being marrow toxicity. Rare toxicity of

Mild dysplastic syndrome explained. Patient has mild renal
insufficiency which will be monitored.

2. Patient will receive 4 infusion of Lu 177 DOTATATE (lutathera)
spaced 2 months apart over six-month interval. Patient to return to
oncologist 1 month after each treatment for assessment of CBC and
CMP to evaluate for marrow toxicity, renal toxicity liver toxicity.
The most common adverse effect is decreased white blood cell count.
Patient should receive IM Sandostatin at this interval.

3. Patient has symptoms of diarrhea and bloating. Symptoms
presumably related to carcinoid syndrome. Patient may experience
benefit from therapy for the symptoms. Recommend monthly Sandostatin
injections. Patient will receive 30 mg IM Sandostatin the day of
TIGER. Recommend interval IM injections 1 month
following therapy as above.

4.  Tentative initial treatment dates [DATE].

## 2019-09-24 NOTE — Consult Note (Signed)
Chief Complaint: Patient with metastatic neuroendocrine tumor was for  evaluation Peptide receptor radiotherapy (PRRT) with FF638 DOTATATE (Lutathera).    Referring Physician(s):Sherrill   Patient Status: [facility] - Out-pt  History of Present Illness: William Hancock is a 54 y.o. 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  Initial diagnosis in June 2020 upon the discovery of multiple hepatic metastasis.  Patient had experienced bloating and diarrhea no prompted ultrasound in subsequent CT.  Biopsy of liver lesion demonstrated well differentiated neuroendocrine tumor. Ki-67 labeling index less than 3%;  Subsequent  DOTATATE PET scan of 11/12/2018 demonstrated multiple foci of well differentiated neuroendocrine tumor within the liver as well as a central mesenteric mass and potential small nodule in the small bowel.]  Subsequently patient demonstrates evidence of progression with increase in size of liver metastasis and increasing chromogranin level currently greater than 3,000    Past Medical History:  Diagnosis Date  . Coronary artery disease   . Elevated liver enzymes 06/09/2018  . Hyperlipidemia   . Hypertension   . Myocardial infarction ([place])   . Neuro-endocrine cancer ([place]) 11/07/2018    Past Surgical History:  Procedure Laterality Date  . CARDIAC CATHETERIZATION  06/09/2018  . CORONARY ATHERECTOMY N/A 06/09/2018   Procedure: CORONARY ATHERECTOMY;  Surgeon: Nigel Mormon, MD;  Location: [place] [facility];  Service: Cardiovascular;  Laterality: N/A;  . CORONARY STENT INTERVENTION N/A 06/06/2018   Procedure: CORONARY STENT INTERVENTION;  Surgeon: Nigel Mormon, MD;  Location: [place] [facility];  Service: Cardiovascular;  Laterality: N/A;  . CORONARY STENT INTERVENTION  06/09/2018  . CORONARY STENT INTERVENTION N/A 06/09/2018   Procedure: CORONARY STENT INTERVENTION;  Surgeon: Nigel Mormon, MD;  Location: [place] [facility];   Service: Cardiovascular;  Laterality: N/A;  . INTRAVASCULAR ULTRASOUND/IVUS N/A 06/06/2018   Procedure: Intravascular Ultrasound/IVUS;  Surgeon: Nigel Mormon, MD;  Location: [place] [facility];  Service: Cardiovascular;  Laterality: N/A;  . INTRAVASCULAR ULTRASOUND/IVUS N/A 06/09/2018   Procedure: Intravascular Ultrasound/IVUS;  Surgeon: Nigel Mormon, MD;  Location: [place] [facility];  Service: Cardiovascular;  Laterality: N/A;  . KNEE SURGERY Left 2009  . LEFT HEART CATH AND CORONARY ANGIOGRAPHY N/A 06/06/2018   Procedure: LEFT HEART CATH AND CORONARY ANGIOGRAPHY;  Surgeon: Nigel Mormon, MD;  Location: [place] [facility];  Service: Cardiovascular;  Laterality: N/A;  . TONSILLECTOMY    . WISDOM TOOTH EXTRACTION      Allergies: Penicillins  Medications: Prior to Admission medications   Medication Sig Start Date End Date Taking? Authorizing Provider  amLODipine (NORVASC) 5 MG tablet Take 1 tablet (5 mg total) by mouth daily. 07/10/19   Patwardhan, Reynold Bowen, MD  aspirin 81 MG chewable tablet Chew 1 tablet (81 mg total) by mouth daily. 07/10/19   Patwardhan, Reynold Bowen, MD  atorvastatin (LIPITOR) 40 MG tablet TAKE ONE TABLET BY MOUTH DAILY 04/09/19   Patwardhan, Manish Madai Nuccio, MD  betamethasone dipropionate (DIPROLENE) 0.05 % cream Apply 1 application topically daily.    [provider]  levothyroxine (SYNTHROID) 125 MCG tablet Take 125 mcg by mouth daily. 12/27/18   [provider]  metoprolol tartrate (LOPRESSOR) 25 MG tablet Take 1 tablet (25 mg total) by mouth 2 (two) times daily. 07/15/19 10/13/19  Patwardhan, Reynold Bowen, MD  nitroGLYCERIN (NITROSTAT) 0.4 MG SL tablet Place 1 tablet (0.4 mg total) under the tongue every 5 (five) minutes x 3 doses as needed for chest pain. Patient not taking:  Reported on 09/15/2019 11/06/18   Nigel Mormon, MD  ticagrelor (BRILINTA) 60 MG TABS tablet Take 1 tablet (60 mg total) by mouth 2 (two) times daily. 06/29/19   Patwardhan,  Reynold Bowen, MD  vitamin B-12 (CYANOCOBALAMIN) 500 MCG tablet Take 1,000 mcg by mouth daily.    [provider]     Family History  Problem Relation Age of Onset  . Diabetes Father   . Kidney failure Father   . Coronary artery disease Paternal Grandfather        Died in 53s  . Breast cancer Mother   . Breast cancer Sister   . Kidney disease Sister     Social History   Socioeconomic History  . Marital status: Married    Spouse name: Not on file  . Number of children: 2  . Years of education: Not on file  . Highest education level: Not on file  Occupational History  . Occupation: Curator  Tobacco Use  . Smoking status: Former Smoker    Quit date: 06/05/1998    Years since quitting: 21.3  . Smokeless tobacco: Never Used  Substance and Sexual Activity  . Alcohol use: Yes    Comment: 2 drinks/month  . Drug use: Never  . Sexual activity: Not on file  Other Topics Concern  . Not on file  Social History Narrative  . Not on file   Social Determinants of Health   Financial Resource Strain:   . Difficulty of Paying Living Expenses:   Food Insecurity:   . Worried About Charity fundraiser in the Last Year:   . Arboriculturist in the Last Year:   Transportation Needs:   . Film/video editor (Medical):   Marland Kitchen Lack of Transportation (Non-Medical):   Physical Activity:   . Days of Exercise per Week:   . Minutes of Exercise per Session:   Stress:   . Feeling of Stress :   Social Connections:   . Frequency of Communication with Friends and Family:   . Frequency of Social Gatherings with Friends and Family:   . Attends Religious Services:   . Active Member of Clubs or Organizations:   . Attends Archivist Meetings:   Marland Kitchen Marital Status:     ECOG Status: 1 - Symptomatic but completely ambulatory  Review of Systems: A 12 point ROS discussed and pertinent positives are indicated in the HPI above.  All other systems are negative.  Review of  Systems  Constitutional: Negative for appetite change.  Respiratory: Negative.   Cardiovascular: Negative.   Gastrointestinal: Positive for abdominal distention and diarrhea.  Genitourinary: Negative.   Musculoskeletal: Negative.   Skin: Negative.   Allergic/Immunologic: Negative.   Neurological: Negative.   Hematological: Negative.   Psychiatric/Behavioral: Negative.     Vital Signs: There were no vitals taken for this visit.  Physical Exam Constitutional:      Appearance: Normal appearance.  HENT:     Head: Normocephalic.  Cardiovascular:     Rate and Rhythm: Normal rate and regular rhythm.  Pulmonary:     Effort: Pulmonary effort is normal.     Breath sounds: Normal breath sounds.  Abdominal:     General: Bowel sounds are normal.     Palpations: Abdomen is soft.  Skin:    General: Skin is warm.     Capillary Refill: Capillary refill takes less than 2 seconds.  Neurological:     General: No focal deficit present.  Mental Status: He is alert.  Psychiatric:        Mood and Affect: Mood normal.        Behavior: Behavior normal.        Thought Content: Thought content normal.        Judgment: Judgment normal.     Imaging: NM PET (NETSPOT GA 67 DOTATATE) SKULL BASE TO MID THIGH  Result Date: 09/07/2019 CLINICAL DATA:  Metastatic well differentiated neuroendocrine tumor. Liver metastasis. Monthly Sandostatin injections ongoing EXAM: NUCLEAR MEDICINE PET SKULL BASE TO THIGH TECHNIQUE: 5.3 mCi Ga 68 DOTATATE was injected intravenously. Full-ring PET imaging was performed from the skull base to thigh after the radiotracer. CT data was obtained and used for attenuation correction and anatomic localization. COMPARISON:  CT 05/13/2019, DOTATATE PET-CT scan 12/12/2018 with FINDINGS: NECK No radiotracer activity in neck lymph nodes. Diffuse uptake in the thyroid gland is again noted in favored benign. Incidental CT findings: None CHEST No radiotracer accumulation within  mediastinal or hilar lymph nodes. No suspicious pulmonary nodules on the CT scan. Mild activity in RIGHT hilar axillary lymph nodes related to COVID vaccine injection. Incidental CT finding:None ABDOMEN/PELVIS Multiple lesions again demonstrated within liver which have intense radiotracer activity. Lesions are near confluent in the LEFT and RIGHT hepatic lobe. The degree of confluency/size of lesions is overall increased from comparison exam. For example lesion in the central RIGHT hepatic lobe measuring 5.4 cm (image 106/4) compares to 4.3 cm on PET-CT 12/12/2018. Lesion in the more inferior RIGHT hepatic lobe (image 118/4) measures 4.1 cm compared to 3.6 cm. Lesion in the posterior RIGHT hepatic lobe measures 3.9 cm (image 124/4) compared to 2.4 cm. Lesions have similar to slightly decreased radiotracer activity. For example lesion inferior RIGHT hepatic lobe SUV max equal 24.5 comparison 25.1 (image 138). Large lesion in the central RIGHT hepatic lobe with SUV max equal 24.2 comparison SUV max equal 30 (image 111). Lesion LEFT hepatic lobe SUV max equal 21.7 compared to SUV max equal 30.7 Within the central mesentery there is a partially calcified mass measuring 3.1 x 2.9 cm compared with 3.2 x 2.8 cm. Lesion has intense radiotracer activity with SUV max equal 11.5 compared to 18.3. Adjacent to the central mesenteric mass there is a focus of radiotracer activity which appears to be associated with the small bowel with SUV max equal 9.3 compared SUV max equal 14.8. This lesion is difficult define on the CT portion of exam. No new lesions are present in the abdomen pelvis. Physiologic activity noted in the liver, spleen, adrenal glands and kidneys. Incidental CT findings:No bowel obstruction. Nonobstructing LEFT renal calculus. SKELETON No focal activity to suggest skeletal metastasis. Incidental CT findings:None IMPRESSION: 1. Interval increase in size of hepatic lesions in the LEFT and RIGHT hepatic lobe which  reach near confluence. Overall radiotracer activity is similar to slightly decreased from comparison exam. 2. Stable size and radiotracer activity of central mesenteric mass. 3. Lesion presumably localizing to the small bowel adjacent to the mesenteric mass is mildly decreased in activity. No clear lesion identified on CT portion exam. 4. No evidence of new metastatic lesions. Electronically Signed   By: Suzy Bouchard M.D.   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 EXAM: NUCLEAR MEDICINE PET SKULL BASE TO THIGH TECHNIQUE: 4.87 mCi Ga 80 DOTATATE was injected intravenously. Full-ring PET imaging was performed  from the skull base to thigh after the radiotracer. CT data was obtained and used for attenuation correction and anatomic localization. COMPARISON:  None. FINDINGS: NECK No radiotracer activity in neck lymph nodes. Incidental CT findings: Intense activity within the thyroid gland is favored goiter versus thyroiditis. Thyroid carcinoma is not favored. CHEST No radiotracer accumulation within mediastinal or hilar lymph nodes. No suspicious pulmonary nodules on the CT scan. Incidental CT finding:Coronary artery calcification and aortic atherosclerotic calcification. ABDOMEN/PELVIS Within the central mesentery there is a lobular mass with mild internal calcifications measuring 2.7 x 3.2 cm (image 158/4). This mesenteric mass has intense radiotracer activity with SUV max equal 18.3. Within the small bowel, medial to the mass (midline), there is a focus of radiotracer activity with SUV max equal 14.7 (image 161/4). There is no CT correlation at this level; however, on comparison diagnostic CT of November 04, 2018, there is subtle bowel wall thickening in retrospect (image 58/2). No additional bowel lesions are present. No additional metastatic mesenteric lesions, lymph nodes, or  peritoneal implants. Intense radiotracer activity are associated with the multifocal hepatic lesions in LEFT and RIGHT hepatic lobe. These lesions are intense with SUV max greater than 30. Example lesion in the LEFT hepatic lobe measures 3.5 cm with SUV max equal 32. Lesion in the central LEFT hepatic lobe measuring 3.6 cm SUV max equal 30. Lesion inferior RIGHT hepatic lobe measuring 2.3 cm with SUV max equal 25. There approximately 12 lesions in each lobe. Lesions are well depicted on noncontrast CT as hypodense lesions. Physiologic activity noted in the liver, spleen, adrenal glands and kidneys. Incidental CT findings:Atherosclerotic calcification of the aorta. Nonobstructing LEFT renal calculus. Prostate normal. SKELETON No focal activity to suggest skeletal metastasis. Incidental CT findings:None IMPRESSION: 1. Intense radiotracer activity associated with the lobular mesenteric mass consistent well differentiated neuroendocrine tumor metastasis. 2. Focal radiotracer activity within the small bowel just medial to the mass with corresponding thickening on comparison diagnostic CT is favored PRIMARY SMALL BOWEL NEOPLASM. 3. Multifocal hepatic metastasis consistent well differentiated neuroendocrine tumor. Electronically Signed   By: Suzy Bouchard M.D.   On: 12/15/2018 10:23    Labs:  CBC: Recent Labs    11/13/18 0630 09/15/19 0757  WBC 7.8 6.3  HGB 14.7 15.1  HCT 43.2 43.6  PLT 154 207    COAGS: Recent Labs    11/13/18 0630  INR 1.0    BMP: Recent Labs    12/18/18 0842 03/17/19 0821 06/09/19 1034 09/15/19 0757  NA 140 143 141 142  K 4.9 4.4 4.8 4.1  CL 102 109 107 110  CO2 '23 24 26 23  '$ GLUCOSE 83 100* 86 130*  BUN '20 18 19 15  '$ CALCIUM 9.6 9.5 9.5 9.4  CREATININE 1.83* 1.36* 1.41* 1.41*  GFRNONAA 41* 58* 56* 55*  GFRAA 47* >60 >60 >60    LIVER FUNCTION TESTS: Recent Labs    12/18/18 0842 03/17/19 0821 06/09/19 1034 09/15/19 0757  BILITOT 0.7 1.0 1.1 1.1  AST 81*  '24 28 25  '$ ALT 64* 31 37 34  ALKPHOS 54 68 82 95  PROT 6.8 6.8 7.1 6.6  ALBUMIN 4.5 4.0 4.3 3.8    TUMOR MARKERS: No results for input(s): AFPTM, CEA, CA199, CHROMOGRNA in the last 8760 hours.  Assessment and Plan:  [ 1.  Well differentiated neuroendocrine tumor with [multiple hepatic metastasis]. Patient is  a good candidate for peptide receptor radiotherapy. Patient has multiple somatostatin receptor avid metastasis identified by gallium 77 DOTATATE PET  scan. The uptake is intense for size of tumor which is a good indication for efficacy treatment.  Patient was advise of risks and benefits of procedure. Primary benefit being prolongation of progression free survival. Primary risk being marrow toxicity.  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  Patient has mild renal insufficiency which will be monitored..  2.  Patient will receive 4 infusion of Lu 177 DOTATATE (lutathera) spaced 2 months apart over six-month interval. Patient to return to oncologist 1 month after each treatment for assessment of CBC and CMP to evaluate for marrow toxicity, renal toxicity liver toxicity. The most common adverse effect is decreased white blood cell count. Patient should receive IM Sandostatin at this interval.   3.  Patient has symptoms of [diarrhea and bloating.].  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 Patient may experience benefit from therapy for the symptoms. Recommend monthly Sandostatin injections. Patient will receive 30 mg IM Sandostatin the day of Luthathera treatemnt. Recommend interval IM injections 1 month following therapy as above.  4.  Tentative initial treatment dates [10/09/2018].   Thank you for this interesting consult.  I greatly enjoyed meeting William Hancock and look forward to participating in their care.  A copy of this report was sent to the requesting provider on this date.  Electronically Signed: Rennis Golden, MD 09/24/2019, 3:48 PM   I  spent a total of  40 Minutes   in face to face in clinical consultation, greater than 50% of which was counseling/coordinating care for metastatic neuroendocrine tumor.

## 2019-09-25 ENCOUNTER — Telehealth: Payer: Self-pay | Admitting: Oncology

## 2019-09-25 NOTE — Telephone Encounter (Signed)
R/s appt per 4/23 sch message - pt is aware of appt changes.

## 2019-09-29 ENCOUNTER — Telehealth: Payer: Self-pay | Admitting: *Deleted

## 2019-09-29 NOTE — Telephone Encounter (Signed)
Called to inquire if lanreotide will be given on 10/13/19 with his 1st Lutathera treatment. Per Lovena Le, they are working on if it can be given there after the infusion. Will call with update once this is determined. If given there, will be able to cancel the injection on 5/12 at [facility].

## 2019-10-04 ENCOUNTER — Encounter: Payer: Self-pay | Admitting: Oncology

## 2019-10-05 ENCOUNTER — Other Ambulatory Visit: Payer: Self-pay | Admitting: Cardiology

## 2019-10-06 ENCOUNTER — Other Ambulatory Visit: Payer: Federal, State, Local not specified - PPO

## 2019-10-06 ENCOUNTER — Ambulatory Visit: Payer: Federal, State, Local not specified - PPO

## 2019-10-06 ENCOUNTER — Ambulatory Visit: Payer: Federal, State, Local not specified - PPO | Admitting: Oncology

## 2019-10-12 ENCOUNTER — Encounter: Payer: Self-pay | Admitting: Oncology

## 2019-10-13 ENCOUNTER — Ambulatory Visit (HOSPITAL_COMMUNITY)
Admission: RE | Admit: 2019-10-13 | Discharge: 2019-10-13 | Disposition: A | Discharge status: Home / Self Care | Payer: Federal, State, Local not specified - PPO | Source: Ambulatory Visit | Attending: Oncology | Admitting: Oncology

## 2019-10-13 ENCOUNTER — Encounter (HOSPITAL_COMMUNITY): Payer: Self-pay

## 2019-10-13 ENCOUNTER — Other Ambulatory Visit: Payer: Self-pay

## 2019-10-13 ENCOUNTER — Inpatient Hospital Stay: Payer: Federal, State, Local not specified - PPO | Attending: Nurse Practitioner

## 2019-10-13 DIAGNOSIS — D3A098 Benign carcinoid tumors of other sites: Secondary | ICD-10-CM | POA: Diagnosis not present

## 2019-10-13 DIAGNOSIS — C7A1 Malignant poorly differentiated neuroendocrine tumors: Secondary | ICD-10-CM | POA: Diagnosis not present

## 2019-10-13 LAB — CBC WITH DIFFERENTIAL/PLATELET
Abs Immature Granulocytes: 0.03 10*3/uL (ref 0.00–0.07)
Basophils Absolute: 0.1 10*3/uL (ref 0.0–0.1)
Basophils Relative: 1 %
Eosinophils Absolute: 0.6 10*3/uL — ABNORMAL HIGH (ref 0.0–0.5)
Eosinophils Relative: 7 %
HCT: 46.6 % (ref 39.0–52.0)
Hemoglobin: 15.5 g/dL (ref 13.0–17.0)
Immature Granulocytes: 0 %
Lymphocytes Relative: 19 %
Lymphs Abs: 1.4 10*3/uL (ref 0.7–4.0)
MCH: 29.8 pg (ref 26.0–34.0)
MCHC: 33.3 g/dL (ref 30.0–36.0)
MCV: 89.6 fL (ref 80.0–100.0)
Monocytes Absolute: 0.7 10*3/uL (ref 0.1–1.0)
Monocytes Relative: 9 %
Neutro Abs: 4.7 10*3/uL (ref 1.7–7.7)
Neutrophils Relative %: 64 %
Platelets: 204 10*3/uL (ref 150–400)
RBC: 5.2 MIL/uL (ref 4.22–5.81)
RDW: 12.7 % (ref 11.5–15.5)
WBC: 7.4 10*3/uL (ref 4.0–10.5)
nRBC: 0 % (ref 0.0–0.2)

## 2019-10-13 LAB — COMPREHENSIVE METABOLIC PANEL
ALT: 24 U/L (ref 0–44)
AST: 21 U/L (ref 15–41)
Albumin: 4.3 g/dL (ref 3.5–5.0)
Alkaline Phosphatase: 85 U/L (ref 38–126)
Anion gap: 7 (ref 5–15)
BUN: 20 mg/dL (ref 6–20)
CO2: 24 mmol/L (ref 22–32)
Calcium: 9.5 mg/dL (ref 8.9–10.3)
Chloride: 112 mmol/L — ABNORMAL HIGH (ref 98–111)
Creatinine, Ser: 1.42 mg/dL — ABNORMAL HIGH (ref 0.61–1.24)
GFR calc Af Amer: >60 mL/min (ref >60)
GFR calc non Af Amer: 55 mL/min — ABNORMAL LOW (ref >60)
Glucose, Bld: 108 mg/dL — ABNORMAL HIGH (ref 70–99)
Potassium: 4.3 mmol/L (ref 3.5–5.1)
Sodium: 143 mmol/L (ref 135–145)
Total Bilirubin: 1.3 mg/dL — ABNORMAL HIGH (ref 0.3–1.2)
Total Protein: 7.1 g/dL (ref 6.5–8.1)

## 2019-10-13 IMAGING — NM NM [PERSON_NAME] ADMINISTRATION
1 series · 1 of 1 positions shown · non-contrast
Comparison: none

CLINICAL DATA: [age] year-old male with metastatic
neuroendocrine tumor. Well differentiated tumor with somatostatin
receptor is identified within the liver in central mesentery by
DOTATATE PET CT scan.

EXAM:
NUCLEAR MEDICINE LUTATHERA ADMINISTRATION
TECHNIQUE: Infusion: The nuclear medicine technologist and I personally
verified the dose activity (202 mCi) to be delivered as specified in
the written directive (200 mCi), and verified the patient
identification via 2 separate methods. 20 gauge IV were started in
the antecubital veins. Anti-emetics were administered by nursing
staff. HEAVENS SAMCZAR acid renal protection was initiated 30 minutes prior to
Lu 177 DOTATATE (Lutathera) infusion and continued continuously for
4 hours. Lutathera infusion was administered over 30 minutes.

[Series 1: static · 2.07mm/px · 1 of 1 slices shown]
[im 1/1]
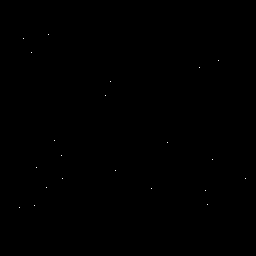

[1 of 1 positions shown; findings below may reference images not displayed]

The total administered dose was 201 mCi Lu 177 DOTATATE.

The entire IV tubing, venocatheter, stopcock and syringes was
removed in total, placed in a disposal bag and sent for assay of the
residual activity, which will be reported at a later time in our EMR
by the physics staff. Pressure was applied to the venipuncture
sites, and a compression bandage placed. Radiation Safety personnel
were present to perform the discharge survey, as detailed on their
documentation.

Patient received 30 mg IM long-acting Sandostatin injection 4 hours
after Lutathera effusion in the nuclear medicine department.

RADIOPHARMACEUTICALS:  Two hundred one mCi Lu 177 DOTATATE
FINDINGS: Diagnosis: Metastatic neuroendocrine tumor.

Current Infusion: 1

Planned Infusions: 4

Patient reports mild carcinoid symptoms including bloating and
diarrhea. The patient's most recent blood counts were reviewed and
remains a good candidate to proceed with Lutathera. Again noted
chronic renal insufficiency with creatinine equal 1.4 and GFR equal
55. Patient's bilirubin mildly elevated 1.3. CBC normal.

The patient was situated in an infusion suite and administered
Lutathera as above. Patient will follow-up with referring oncologist
for interval serum laboratories (CBC and CMP) in approximately 4
weeks.

Patient received 30 mg IM long-acting Sandostatin injection 4 hours
after Lutathera effusion in the nuclear medicine department.
IMPRESSION: First Lu 177 DOTATATE treatment for metastatic neuroendocrine tumor.
The patient tolerated the infusion well. The patient will return in
8 weeks for ongoing care.

## 2019-10-13 MED ORDER — LUTETIUM LU 177 DOTATATE 370 MBQ/ML IV SOLN
200.0000 | Freq: Once | INTRAVENOUS | Status: AC
  Administered 2019-10-13: 10:00:00 200.96 via INTRAVENOUS

## 2019-10-13 MED ORDER — OCTREOTIDE ACETATE 500 MCG/ML IJ SOLN: 500.0000 ug | Freq: Once | INTRAMUSCULAR | Status: DC | PRN

## 2019-10-13 MED ORDER — OCTREOTIDE ACETATE 30 MG IM KIT
PACK | INTRAMUSCULAR | Status: AC
  Administered 2019-10-13: 15:00:00 30 mg via INTRAMUSCULAR
  Filled 2019-10-13: qty 1

## 2019-10-13 MED ORDER — AMINO ACID RADIOPROTECTANT - L-LYSINE 2.5%/L-ARGININE 2.5% IN NS
250.0000 mL/h | INTRAVENOUS | Status: AC
  Administered 2019-10-13: 250 mL/h via INTRAVENOUS
  Filled 2019-10-13: qty 1000

## 2019-10-13 MED ORDER — SODIUM CHLORIDE 0.9 % IV SOLN: 500.0000 mL | Freq: Once | INTRAVENOUS | Status: DC

## 2019-10-13 MED ORDER — SODIUM CHLORIDE 0.9 % IV SOLN
8.0000 mg | Freq: Once | INTRAVENOUS | Status: AC
  Administered 2019-10-13: 09:00:00 8 mg via INTRAVENOUS
  Filled 2019-10-13: qty 4

## 2019-10-13 MED ORDER — OCTREOTIDE ACETATE 30 MG IM KIT: 30.0000 mg | PACK | Freq: Once | INTRAMUSCULAR | Status: AC

## 2019-10-13 MED ORDER — PROCHLORPERAZINE EDISYLATE 10 MG/2ML IJ SOLN: 10.0000 mg | Freq: Four times a day (QID) | INTRAMUSCULAR | Status: DC | PRN

## 2019-10-13 MED ORDER — ONDANSETRON HCL 8 MG PO TABS: 8.0000 mg | ORAL_TABLET | Freq: Two times a day (BID) | ORAL | 0 refills | Status: DC | PRN

## 2019-10-13 NOTE — Progress Notes (Signed)
Patient tolerated the Lutathera treatment without difficulty or side effects.

## 2019-10-14 ENCOUNTER — Ambulatory Visit: Payer: Federal, State, Local not specified - PPO

## 2019-10-14 ENCOUNTER — Ambulatory Visit (HOSPITAL_COMMUNITY): Payer: Federal, State, Local not specified - PPO

## 2019-10-14 ENCOUNTER — Other Ambulatory Visit: Payer: Federal, State, Local not specified - PPO

## 2019-10-14 ENCOUNTER — Ambulatory Visit: Payer: Federal, State, Local not specified - PPO | Admitting: Nurse Practitioner

## 2019-11-10 ENCOUNTER — Ambulatory Visit: Payer: Federal, State, Local not specified - PPO | Admitting: Nurse Practitioner

## 2019-11-10 ENCOUNTER — Other Ambulatory Visit: Payer: Federal, State, Local not specified - PPO

## 2019-11-10 ENCOUNTER — Ambulatory Visit: Payer: Federal, State, Local not specified - PPO

## 2019-11-11 ENCOUNTER — Inpatient Hospital Stay: Payer: Federal, State, Local not specified - PPO | Attending: Nurse Practitioner

## 2019-11-11 ENCOUNTER — Other Ambulatory Visit: Payer: Self-pay

## 2019-11-11 ENCOUNTER — Encounter: Payer: Self-pay | Admitting: Nurse Practitioner

## 2019-11-11 ENCOUNTER — Inpatient Hospital Stay: Payer: Federal, State, Local not specified - PPO | Admitting: Nurse Practitioner

## 2019-11-11 ENCOUNTER — Inpatient Hospital Stay: Payer: Federal, State, Local not specified - PPO

## 2019-11-11 VITALS — BP 130/88 | HR 62 | Temp 97.7°F | Resp 17 | Ht 71.0 in | Wt 208.3 lb

## 2019-11-11 DIAGNOSIS — C7B02 Secondary carcinoid tumors of liver: Secondary | ICD-10-CM | POA: Insufficient documentation

## 2019-11-11 DIAGNOSIS — D3A098 Benign carcinoid tumors of other sites: Secondary | ICD-10-CM

## 2019-11-11 DIAGNOSIS — Z79899 Other long term (current) drug therapy: Secondary | ICD-10-CM | POA: Diagnosis not present

## 2019-11-11 DIAGNOSIS — C7B04 Secondary carcinoid tumors of peritoneum: Secondary | ICD-10-CM | POA: Diagnosis not present

## 2019-11-11 DIAGNOSIS — E039 Hypothyroidism, unspecified: Secondary | ICD-10-CM | POA: Insufficient documentation

## 2019-11-11 DIAGNOSIS — N289 Disorder of kidney and ureter, unspecified: Secondary | ICD-10-CM | POA: Diagnosis not present

## 2019-11-11 DIAGNOSIS — R110 Nausea: Secondary | ICD-10-CM | POA: Insufficient documentation

## 2019-11-11 DIAGNOSIS — I2510 Atherosclerotic heart disease of native coronary artery without angina pectoris: Secondary | ICD-10-CM | POA: Insufficient documentation

## 2019-11-11 DIAGNOSIS — C7A098 Malignant carcinoid tumors of other sites: Secondary | ICD-10-CM | POA: Diagnosis not present

## 2019-11-11 DIAGNOSIS — I252 Old myocardial infarction: Secondary | ICD-10-CM | POA: Diagnosis not present

## 2019-11-11 DIAGNOSIS — R197 Diarrhea, unspecified: Secondary | ICD-10-CM | POA: Diagnosis not present

## 2019-11-11 DIAGNOSIS — I10 Essential (primary) hypertension: Secondary | ICD-10-CM | POA: Insufficient documentation

## 2019-11-11 LAB — CBC WITH DIFFERENTIAL (CANCER CENTER ONLY)
Abs Immature Granulocytes: 0.04 10*3/uL (ref 0.00–0.07)
Basophils Absolute: 0.1 10*3/uL (ref 0.0–0.1)
Basophils Relative: 1 %
Eosinophils Absolute: 0.5 10*3/uL (ref 0.0–0.5)
Eosinophils Relative: 6 %
HCT: 43.1 % (ref 39.0–52.0)
Hemoglobin: 14.7 g/dL (ref 13.0–17.0)
Immature Granulocytes: 1 %
Lymphocytes Relative: 16 %
Lymphs Abs: 1.4 10*3/uL (ref 0.7–4.0)
MCH: 30.3 pg (ref 26.0–34.0)
MCHC: 34.1 g/dL (ref 30.0–36.0)
MCV: 88.9 fL (ref 80.0–100.0)
Monocytes Absolute: 1 10*3/uL (ref 0.1–1.0)
Monocytes Relative: 11 %
Neutro Abs: 5.5 10*3/uL (ref 1.7–7.7)
Neutrophils Relative %: 65 %
Platelet Count: 230 10*3/uL (ref 150–400)
RBC: 4.85 MIL/uL (ref 4.22–5.81)
RDW: 12.3 % (ref 11.5–15.5)
WBC Count: 8.5 10*3/uL (ref 4.0–10.5)
nRBC: 0 % (ref 0.0–0.2)

## 2019-11-11 LAB — CMP (CANCER CENTER ONLY)
ALT: 24 U/L (ref 0–44)
AST: 20 U/L (ref 15–41)
Albumin: 3.7 g/dL (ref 3.5–5.0)
Alkaline Phosphatase: 117 U/L (ref 38–126)
Anion gap: 12 (ref 5–15)
BUN: 16 mg/dL (ref 6–20)
CO2: 23 mmol/L (ref 22–32)
Calcium: 9.4 mg/dL (ref 8.9–10.3)
Chloride: 107 mmol/L (ref 98–111)
Creatinine: 1.36 mg/dL — ABNORMAL HIGH (ref 0.61–1.24)
GFR, Est AFR Am: >60 mL/min (ref >60)
GFR, Estimated: 58 mL/min — ABNORMAL LOW (ref >60)
Glucose, Bld: 87 mg/dL (ref 70–99)
Potassium: 4 mmol/L (ref 3.5–5.1)
Sodium: 142 mmol/L (ref 135–145)
Total Bilirubin: 0.6 mg/dL (ref 0.3–1.2)
Total Protein: 7 g/dL (ref 6.5–8.1)

## 2019-11-11 MED ORDER — LANREOTIDE ACETATE 120 MG/0.5ML ~~LOC~~ SOLN
120.0000 mg | Freq: Once | SUBCUTANEOUS | Status: AC
  Administered 2019-11-11: 120 mg via SUBCUTANEOUS
  Filled 2019-11-11: qty 120

## 2019-11-11 NOTE — Progress Notes (Addendum)
  [facility] OFFICE PROGRESS NOTE   Diagnosis: Carcinoid tumor  INTERVAL HISTORY:   William Hancock returns as scheduled.  He completed first Lutathera on 10/13/2019.  He had mild nausea 3 to 4 days later.  Baseline bowel habits unchanged.  He has several loose stools a day.  No flushing or sweating episodes.  He denies pain.  Objective:  Vital signs in last 24 hours:  Blood pressure 130/88, pulse 62, temperature 97.7 F (36.5 C), temperature source Temporal, resp. rate 17, height 5' 11" (1.803 m), weight 208 lb 4.8 oz (94.5 kg), SpO2 98 %.    HEENT: No thrush or ulcers. Resp: Lungs clear bilaterally. Cardio: Regular rate and rhythm. GI: Abdomen soft and nontender.  Liver edge palpable.  No splenomegaly. Vascular: No leg edema. Skin: No rash.   Lab Results:  Lab Results  Component Value Date   WBC 8.5 11/11/2019   HGB 14.7 11/11/2019   HCT 43.1 11/11/2019   MCV 88.9 11/11/2019   PLT 230 11/11/2019   NEUTROABS 5.5 11/11/2019    Imaging:  No results found.  Medications: I have reviewed the patient's current medications.  Assessment/Plan: 1. Metastatic neuroendocrine tumor, well-differentiated  10/31/2018 Abdominal ultrasound-numerous nearly confluent heterogeneous masses measuring up to 3.5 cm.    11/04/2018 CT abdomen/pelvis-multiple enhancing hepatic lesions and a central partially calcified spiculated mesenteric mass measuring 3.1 x 3.1 cm.    11/13/2018 biopsy liver lesion-metastatic well-differentiated neuroendocrine tumor positive for CKAE1-AE3, synaptophysin, chromogranin, CD56 and CDX-2; Ki-67 labeling index less than 3%; TTF-1 negative  Netspot 12/12/2018- increased activity associated a lobular mesenteric mass, small bowel medial to the mesenteric mass, and multiple liver lesions  Elevated chromogranin A level and 24-hour urine 5 HIAA  Monthly lanreotide beginning 12/23/2018  CT 05/13/2019-multiple large hepatic metastases on the dotatate are  poorly visualized, several small lesions are decreased in size 1 new lesion, stable mesenteric mass  Netspot 09/04/2019-increase in size of left and right liver lesions, stable to slightly decreased radiotracer accumulation, stable central mesenteric mass, no new metastatic lesions  Lutathera 10/13/2019 2. History of MI/stent placement 3. CAD 4. Hypertension 5. Renal insufficiency 6. Hypothyroidism diagnosed with markedly elevated TSH and low T4 on 12/18/2018  Disposition: William Hancock appears stable.  He completed the first Lutathera on 10/13/2019.  He tolerated well aside from a single episode of mild nausea.  He will receive a lanreotide injection today.  Next Lutathera is scheduled 12/04/2019.  He will return for lab, follow-up, lanreotide on 01/01/2020.  Patient seen with Dr. Benay Hancock.    William Hancock ANP/GNP-BC   11/11/2019  11:55 AM  This was a shared visit with William Hancock.  [name] tolerated the first treatment with Lutathera well.  He will continue monthly lanreotide.  We will see him after the next Lutathera treatment.  William Manson, MD

## 2019-11-11 NOTE — Patient Instructions (Signed)
Lanreotide injection What is this medicine? LANREOTIDE (lan REE oh tide) is used to reduce blood levels of growth hormone in patients with a condition called acromegaly. It also works to slow or stop tumor growth in patients with neuroendocrine tumors and treat carcinoid syndrome. This medicine may be used for other purposes; ask your health care provider or pharmacist if you have questions. COMMON BRAND NAME(S): Somatuline [place] What should I tell my health care provider before I take this medicine? They need to know if you have any of these conditions:  diabetes  gallbladder disease  heart disease  kidney disease  liver disease  thyroid disease  an unusual or allergic reaction to lanreotide, other medicines, foods, dyes, or preservatives  pregnant or trying to get pregnant  breast-feeding How should I use this medicine? This medicine is for injection under the skin. It is given by a health care professional in a hospital or clinic setting. Contact your pediatrician or health care professional regarding the use of this medicine in children. Special care may be needed. Overdosage: If you think you have taken too much of this medicine contact a poison control center or emergency room at once. NOTE: This medicine is only for you. Do not share this medicine with others. What if I miss a dose? It is important not to miss your dose. Call your doctor or health care professional if you are unable to keep an appointment. What may interact with this medicine? This medicine may interact with the following medications:  bromocriptine  cyclosporine  certain medicines for blood pressure, heart disease, irregular heart beat  certain medicines for diabetes  quinidine  terfenadine This list may not describe all possible interactions. Give your health care provider a list of all the medicines, herbs, non-prescription drugs, or dietary supplements you use. Also tell them if you smoke,  drink alcohol, or use illegal drugs. Some items may interact with your medicine. What should I watch for while using this medicine? Tell your doctor or healthcare professional if your symptoms do not start to get better or if they get worse. Visit your doctor or health care professional for regular checks on your progress. Your condition will be monitored carefully while you are receiving this medicine. This medicine may increase blood sugar. Ask your healthcare provider if changes in diet or medicines are needed if you have diabetes. You may need blood work done while you are taking this medicine. Women should inform their doctor if they wish to become pregnant or think they might be pregnant. There is a potential for serious side effects to an unborn child. Talk to your health care professional or pharmacist for more information. Do not breast-feed an infant while taking this medicine or for 6 months after stopping it. This medicine has caused ovarian failure in some women. This medicine may interfere with the ability to have a child. Talk with your doctor or health care professional if you are concerned about your fertility. What side effects may I notice from receiving this medicine? Side effects that you should report to your doctor or health care professional as soon as possible:  allergic reactions like skin rash, itching or hives, swelling of the face, lips, or tongue  increased blood pressure  severe stomach pain  signs and symptoms of hgh blood sugar such as being more thirsty or hungry or having to urinate more than normal. You may also feel very tired or have blurry vision.  signs and symptoms of low blood   sugar such as feeling anxious; confusion; dizziness; increased hunger; unusually weak or tired; sweating; shakiness; cold; irritable; headache; blurred vision; fast heartbeat; loss of consciousness  unusually slow heartbeat Side effects that usually do not require medical  attention (report to your doctor or health care professional if they continue or are bothersome):  constipation  diarrhea  dizziness  headache  muscle pain  muscle spasms  nausea  pain, redness, or irritation at site where injected This list may not describe all possible side effects. Call your doctor for medical advice about side effects. You may report side effects to FDA at 1-800-FDA-1088. Where should I keep my medicine? This drug is given in a hospital or clinic and will not be stored at home. NOTE: This sheet is a summary. It may not cover all possible information. If you have questions about this medicine, talk to your doctor, pharmacist, or health care provider.  2020 Elsevier/Gold Standard (2018-02-27 09:13:08)  

## 2019-11-13 ENCOUNTER — Telehealth: Payer: Self-pay | Admitting: Nurse Practitioner

## 2019-11-13 NOTE — Telephone Encounter (Signed)
Scheduled per los. Called and left msg. Informed of change inf appt on 7/30. Mailed printout

## 2019-12-04 ENCOUNTER — Other Ambulatory Visit: Payer: Self-pay

## 2019-12-04 ENCOUNTER — Ambulatory Visit: Payer: Federal, State, Local not specified - PPO

## 2019-12-04 ENCOUNTER — Ambulatory Visit (HOSPITAL_COMMUNITY)
Admission: RE | Admit: 2019-12-04 | Discharge: 2019-12-04 | Disposition: A | Discharge status: Home / Self Care | Payer: Federal, State, Local not specified - PPO | Source: Ambulatory Visit | Attending: Oncology | Admitting: Oncology

## 2019-12-04 DIAGNOSIS — D3A098 Benign carcinoid tumors of other sites: Secondary | ICD-10-CM | POA: Insufficient documentation

## 2019-12-04 LAB — CBC WITH DIFFERENTIAL/PLATELET
Abs Immature Granulocytes: 0.02 10*3/uL (ref 0.00–0.07)
Basophils Absolute: 0.1 10*3/uL (ref 0.0–0.1)
Basophils Relative: 2 %
Eosinophils Absolute: 0.4 10*3/uL (ref 0.0–0.5)
Eosinophils Relative: 8 %
HCT: 40.8 % (ref 39.0–52.0)
Hemoglobin: 13.8 g/dL (ref 13.0–17.0)
Immature Granulocytes: 0 %
Lymphocytes Relative: 15 %
Lymphs Abs: 0.8 10*3/uL (ref 0.7–4.0)
MCH: 30 pg (ref 26.0–34.0)
MCHC: 33.8 g/dL (ref 30.0–36.0)
MCV: 88.7 fL (ref 80.0–100.0)
Monocytes Absolute: 0.6 10*3/uL (ref 0.1–1.0)
Monocytes Relative: 11 %
Neutro Abs: 3.3 10*3/uL (ref 1.7–7.7)
Neutrophils Relative %: 64 %
Platelets: 163 10*3/uL (ref 150–400)
RBC: 4.6 MIL/uL (ref 4.22–5.81)
RDW: 12.7 % (ref 11.5–15.5)
WBC: 5.3 10*3/uL (ref 4.0–10.5)
nRBC: 0 % (ref 0.0–0.2)

## 2019-12-04 LAB — COMPREHENSIVE METABOLIC PANEL
ALT: 30 U/L (ref 0–44)
AST: 26 U/L (ref 15–41)
Albumin: 4 g/dL (ref 3.5–5.0)
Alkaline Phosphatase: 101 U/L (ref 38–126)
Anion gap: 7 (ref 5–15)
BUN: 20 mg/dL (ref 6–20)
CO2: 24 mmol/L (ref 22–32)
Calcium: 9.2 mg/dL (ref 8.9–10.3)
Chloride: 108 mmol/L (ref 98–111)
Creatinine, Ser: 1.22 mg/dL (ref 0.61–1.24)
GFR calc Af Amer: >60 mL/min (ref >60)
GFR calc non Af Amer: >60 mL/min (ref >60)
Glucose, Bld: 136 mg/dL — ABNORMAL HIGH (ref 70–99)
Potassium: 4.1 mmol/L (ref 3.5–5.1)
Sodium: 139 mmol/L (ref 135–145)
Total Bilirubin: 1 mg/dL (ref 0.3–1.2)
Total Protein: 6.6 g/dL (ref 6.5–8.1)

## 2019-12-04 MED ORDER — AMINO ACID RADIOPROTECTANT - L-LYSINE 2.5%/L-ARGININE 2.5% IN NS
250.0000 mL/h | INTRAVENOUS | Status: AC
  Administered 2019-12-04: 250 mL/h via INTRAVENOUS
  Filled 2019-12-04: qty 1000

## 2019-12-04 MED ORDER — LUTETIUM LU 177 DOTATATE 370 MBQ/ML IV SOLN
200.0000 | Freq: Once | INTRAVENOUS | Status: AC
  Administered 2019-12-04: 200 via INTRAVENOUS

## 2019-12-04 MED ORDER — OCTREOTIDE ACETATE 30 MG IM KIT: 30.0000 mg | PACK | Freq: Once | INTRAMUSCULAR | Status: DC

## 2019-12-04 MED ORDER — PROCHLORPERAZINE EDISYLATE 10 MG/2ML IJ SOLN: 10.0000 mg | Freq: Four times a day (QID) | INTRAMUSCULAR | Status: DC | PRN

## 2019-12-04 MED ORDER — LANREOTIDE ACETATE 120 MG/0.5ML ~~LOC~~ SOLN
SUBCUTANEOUS | Status: AC
  Administered 2019-12-04: 120 mg via INTRAMUSCULAR
  Filled 2019-12-04: qty 120

## 2019-12-04 MED ORDER — OCTREOTIDE ACETATE 500 MCG/ML IJ SOLN: 500.0000 ug | Freq: Once | INTRAMUSCULAR | Status: DC | PRN

## 2019-12-04 MED ORDER — ONDANSETRON HCL 8 MG PO TABS: 8.0000 mg | ORAL_TABLET | Freq: Two times a day (BID) | ORAL | 0 refills | Status: DC | PRN

## 2019-12-04 MED ORDER — SODIUM CHLORIDE 0.9 % IV SOLN
8.0000 mg | Freq: Once | INTRAVENOUS | Status: AC
  Administered 2019-12-04: 8 mg via INTRAVENOUS
  Filled 2019-12-04: qty 4

## 2019-12-04 MED ORDER — SODIUM CHLORIDE 0.9 % IV SOLN: 500.0000 mL | Freq: Once | INTRAVENOUS | Status: DC

## 2019-12-04 MED ORDER — LANREOTIDE ACETATE 120 MG/0.5ML ~~LOC~~ SOLN
SUBCUTANEOUS | Status: AC
  Filled 2019-12-04: qty 120

## 2019-12-04 NOTE — Progress Notes (Signed)
Diagnosis: [Metastatic neuroendocrine tumor.]  Current Infusion: [2]   [202.4] mCi Lu 177 DOTATATE     Planned Infusions: [4]  Patient reports [minimal] interval symptoms following initial therapy.  Patient had several days of mild nausea.  The patient's most recent blood counts were reviewed and remains a good candidate to proceed with Lutathera.  Patient's renal insufficiency has improved to normal level.  Mildly elevated bilirubin has also normalized..    The patient was situated in an infusion suite and administered Lutathera as above. Patient will follow-up with referring oncologist for interval serum laboratories (CBC and CMP) in approximately 4 weeks.    Patient received 120 mg IM long-acting Lanreotide injection 4 hours after Lutathera effusion in the nuclear medicine department.   IMPRESSION: [Second] ER 740 [id number] treatment for metastatic neuroendocrine tumor. The patient tolerated the infusion well. The patient will return in 8 weeks for ongoing care.

## 2019-12-04 NOTE — Progress Notes (Signed)
The patient has completed treatment of Lutathera. No complications during or post infusion. Vital signs remained stable and the patient was free of nausea, vomiting or diarrhea.

## 2019-12-20 ENCOUNTER — Other Ambulatory Visit: Payer: Self-pay | Admitting: Cardiology

## 2019-12-24 ENCOUNTER — Other Ambulatory Visit: Payer: Self-pay | Admitting: Cardiology

## 2019-12-24 DIAGNOSIS — I2510 Atherosclerotic heart disease of native coronary artery without angina pectoris: Secondary | ICD-10-CM

## 2019-12-25 ENCOUNTER — Ambulatory Visit: Payer: Federal, State, Local not specified - PPO | Admitting: Cardiology

## 2019-12-31 ENCOUNTER — Other Ambulatory Visit: Payer: Self-pay | Admitting: *Deleted

## 2019-12-31 DIAGNOSIS — D3A098 Benign carcinoid tumors of other sites: Secondary | ICD-10-CM

## 2020-01-01 ENCOUNTER — Inpatient Hospital Stay: Payer: Federal, State, Local not specified - PPO

## 2020-01-01 ENCOUNTER — Inpatient Hospital Stay: Payer: Federal, State, Local not specified - PPO | Attending: Nurse Practitioner | Admitting: Oncology

## 2020-01-01 ENCOUNTER — Ambulatory Visit: Payer: Federal, State, Local not specified - PPO

## 2020-01-01 ENCOUNTER — Other Ambulatory Visit: Payer: Self-pay

## 2020-01-01 VITALS — BP 129/86 | HR 61 | Temp 97.5°F | Resp 18 | Ht 71.0 in | Wt 210.2 lb

## 2020-01-01 DIAGNOSIS — D3A098 Benign carcinoid tumors of other sites: Secondary | ICD-10-CM | POA: Diagnosis not present

## 2020-01-01 DIAGNOSIS — I2510 Atherosclerotic heart disease of native coronary artery without angina pectoris: Secondary | ICD-10-CM | POA: Insufficient documentation

## 2020-01-01 DIAGNOSIS — C7A098 Malignant carcinoid tumors of other sites: Secondary | ICD-10-CM | POA: Diagnosis not present

## 2020-01-01 DIAGNOSIS — N289 Disorder of kidney and ureter, unspecified: Secondary | ICD-10-CM | POA: Insufficient documentation

## 2020-01-01 DIAGNOSIS — I252 Old myocardial infarction: Secondary | ICD-10-CM | POA: Insufficient documentation

## 2020-01-01 DIAGNOSIS — E039 Hypothyroidism, unspecified: Secondary | ICD-10-CM | POA: Insufficient documentation

## 2020-01-01 DIAGNOSIS — I10 Essential (primary) hypertension: Secondary | ICD-10-CM | POA: Diagnosis not present

## 2020-01-01 DIAGNOSIS — Z79899 Other long term (current) drug therapy: Secondary | ICD-10-CM | POA: Insufficient documentation

## 2020-01-01 DIAGNOSIS — C7B02 Secondary carcinoid tumors of liver: Secondary | ICD-10-CM | POA: Diagnosis not present

## 2020-01-01 LAB — CBC WITH DIFFERENTIAL (CANCER CENTER ONLY)
Abs Immature Granulocytes: 0.02 10*3/uL (ref 0.00–0.07)
Basophils Absolute: 0.1 10*3/uL (ref 0.0–0.1)
Basophils Relative: 1 %
Eosinophils Absolute: 0.5 10*3/uL (ref 0.0–0.5)
Eosinophils Relative: 7 %
HCT: 43.7 % (ref 39.0–52.0)
Hemoglobin: 14.9 g/dL (ref 13.0–17.0)
Immature Granulocytes: 0 %
Lymphocytes Relative: 8 %
Lymphs Abs: 0.7 10*3/uL (ref 0.7–4.0)
MCH: 30.5 pg (ref 26.0–34.0)
MCHC: 34.1 g/dL (ref 30.0–36.0)
MCV: 89.5 fL (ref 80.0–100.0)
Monocytes Absolute: 1 10*3/uL (ref 0.1–1.0)
Monocytes Relative: 12 %
Neutro Abs: 5.6 10*3/uL (ref 1.7–7.7)
Neutrophils Relative %: 72 %
Platelet Count: 167 10*3/uL (ref 150–400)
RBC: 4.88 MIL/uL (ref 4.22–5.81)
RDW: 12.6 % (ref 11.5–15.5)
WBC Count: 7.9 10*3/uL (ref 4.0–10.5)
nRBC: 0 % (ref 0.0–0.2)

## 2020-01-01 LAB — CMP (CANCER CENTER ONLY)
ALT: 23 U/L (ref 0–44)
AST: 24 U/L (ref 15–41)
Albumin: 3.9 g/dL (ref 3.5–5.0)
Alkaline Phosphatase: 130 U/L — ABNORMAL HIGH (ref 38–126)
Anion gap: 9 (ref 5–15)
BUN: 18 mg/dL (ref 6–20)
CO2: 23 mmol/L (ref 22–32)
Calcium: 10.5 mg/dL — ABNORMAL HIGH (ref 8.9–10.3)
Chloride: 111 mmol/L (ref 98–111)
Creatinine: 1.6 mg/dL — ABNORMAL HIGH (ref 0.61–1.24)
GFR, Est AFR Am: 55 mL/min — ABNORMAL LOW (ref >60)
GFR, Estimated: 47 mL/min — ABNORMAL LOW (ref >60)
Glucose, Bld: 99 mg/dL (ref 70–99)
Potassium: 4.6 mmol/L (ref 3.5–5.1)
Sodium: 143 mmol/L (ref 135–145)
Total Bilirubin: 1 mg/dL (ref 0.3–1.2)
Total Protein: 7 g/dL (ref 6.5–8.1)

## 2020-01-01 MED ORDER — LANREOTIDE ACETATE 120 MG/0.5ML ~~LOC~~ SOLN
120.0000 mg | Freq: Once | SUBCUTANEOUS | Status: AC
  Administered 2020-01-01: 120 mg via SUBCUTANEOUS
  Filled 2020-01-01: qty 120

## 2020-01-01 MED ORDER — LANREOTIDE ACETATE 120 MG/0.5ML ~~LOC~~ SOLN
120.0000 mg | Freq: Once | SUBCUTANEOUS | Status: DC
  Filled 2020-01-01: qty 120

## 2020-01-01 NOTE — Progress Notes (Signed)
  [facility] OFFICE PROGRESS NOTE   Diagnosis: Carcinoid tumor  INTERVAL HISTORY:   Mr. Kniss completed a second treatment with Lutathera on 12/04/2019. He reports mild nausea beginning 1 to 2 days following Lutathera and lasting for a few days. No other complaint. No diarrhea. He has a brief flushing spell when he has a liquor drink.  Objective:  Vital signs in last 24 hours:  Blood pressure (!) 129/86, pulse 61, temperature (!) 97.5 F (36.4 C), temperature source Oral, resp. rate 18, height '5\' 11"'$  (1.803 m), weight (!) 210 lb 3.2 oz (95.3 kg), SpO2 98 %.    Resp: Lungs clear bilaterally Cardio: Regular rate and rhythm GI: No splenomegaly. The liver edge is palpable in the right upper abdomen a few fingers below the costal margin Vascular: No leg edema     Lab Results:  Lab Results  Component Value Date   WBC 7.9 01/01/2020   HGB 14.9 01/01/2020   HCT 43.7 01/01/2020   MCV 89.5 01/01/2020   PLT 167 01/01/2020   NEUTROABS 5.6 01/01/2020    CMP  Lab Results  Component Value Date   NA 143 01/01/2020   K 4.6 01/01/2020   CL 111 01/01/2020   CO2 23 01/01/2020   GLUCOSE 99 01/01/2020   BUN 18 01/01/2020   CREATININE 1.60 (H) 01/01/2020   CALCIUM 10.5 (H) 01/01/2020   PROT 7.0 01/01/2020   ALBUMIN 3.9 01/01/2020   AST 24 01/01/2020   ALT 23 01/01/2020   ALKPHOS 130 (H) 01/01/2020   BILITOT 1.0 01/01/2020   GFRNONAA 47 (L) 01/01/2020   GFRAA 55 (L) 01/01/2020     Medications: I have reviewed the patient's current medications.   Assessment/Plan: 1. Metastatic neuroendocrine tumor, well-differentiated  10/31/2018 Abdominal ultrasound-numerous nearly confluent heterogeneous masses measuring up to 3.5 cm.    11/04/2018 CT abdomen/pelvis-multiple enhancing hepatic lesions and a central partially calcified spiculated mesenteric mass measuring 3.1 x 3.1 cm.    11/13/2018 biopsy liver lesion-metastatic well-differentiated neuroendocrine tumor positive  for CKAE1-AE3, synaptophysin, chromogranin, CD56 and CDX-2; Ki-67 labeling index less than 3%; TTF-1 negative  Netspot 12/12/2018- increased activity associated a lobular mesenteric mass, small bowel medial to the mesenteric mass, and multiple liver lesions  Elevated chromogranin A level and 24-hour urine 5 HIAA  Monthly lanreotide beginning 12/23/2018  CT 05/13/2019-multiple large hepatic metastases on the dotatate are poorly visualized, several small lesions are decreased in size 1 new lesion, stable mesenteric mass  Netspot 09/04/2019-increase in size of left and right liver lesions, stable to slightly decreased radiotracer accumulation, stable central mesenteric mass, no new metastatic lesions  Lutathera 10/13/2019  Lutathera 12/04/2019 2. History of MI/stent placement 3. CAD 4. Hypertension 5. Renal insufficiency 6. Hypothyroidism diagnosed with markedly elevated TSH and low T4 on 12/18/2018    Disposition: William Hancock appears stable. He continues monthly lanreotide. He is scheduled for the next treatment with Lutathera on 01/29/2020. He will return for an office visit and lanreotide on 02/26/2020.  He has chronic renal insufficiency. The mild elevation of the calcium today is likely benign normal variation. We will be sure a chemistry panel is obtained when he is seen in radiology on 01/29/2020.  Betsy Coder, MD  01/01/2020  9:54 AM

## 2020-01-01 NOTE — Patient Instructions (Signed)
Lanreotide injection What is this medicine? LANREOTIDE (lan REE oh tide) is used to reduce blood levels of growth hormone in patients with a condition called acromegaly. It also works to slow or stop tumor growth in patients with neuroendocrine tumors and treat carcinoid syndrome. This medicine may be used for other purposes; ask your health care provider or pharmacist if you have questions. COMMON BRAND NAME(S): Somatuline [place] What should I tell my health care provider before I take this medicine? They need to know if you have any of these conditions:  diabetes  gallbladder disease  heart disease  kidney disease  liver disease  thyroid disease  an unusual or allergic reaction to lanreotide, other medicines, foods, dyes, or preservatives  pregnant or trying to get pregnant  breast-feeding How should I use this medicine? This medicine is for injection under the skin. It is given by a health care professional in a hospital or clinic setting. Contact your pediatrician or health care professional regarding the use of this medicine in children. Special care may be needed. Overdosage: If you think you have taken too much of this medicine contact a poison control center or emergency room at once. NOTE: This medicine is only for you. Do not share this medicine with others. What if I miss a dose? It is important not to miss your dose. Call your doctor or health care professional if you are unable to keep an appointment. What may interact with this medicine? This medicine may interact with the following medications:  bromocriptine  cyclosporine  certain medicines for blood pressure, heart disease, irregular heart beat  certain medicines for diabetes  quinidine  terfenadine This list may not describe all possible interactions. Give your health care provider a list of all the medicines, herbs, non-prescription drugs, or dietary supplements you use. Also tell them if you smoke,  drink alcohol, or use illegal drugs. Some items may interact with your medicine. What should I watch for while using this medicine? Tell your doctor or healthcare professional if your symptoms do not start to get better or if they get worse. Visit your doctor or health care professional for regular checks on your progress. Your condition will be monitored carefully while you are receiving this medicine. This medicine may increase blood sugar. Ask your healthcare provider if changes in diet or medicines are needed if you have diabetes. You may need blood work done while you are taking this medicine. Women should inform their doctor if they wish to become pregnant or think they might be pregnant. There is a potential for serious side effects to an unborn child. Talk to your health care professional or pharmacist for more information. Do not breast-feed an infant while taking this medicine or for 6 months after stopping it. This medicine has caused ovarian failure in some women. This medicine may interfere with the ability to have a child. Talk with your doctor or health care professional if you are concerned about your fertility. What side effects may I notice from receiving this medicine? Side effects that you should report to your doctor or health care professional as soon as possible:  allergic reactions like skin rash, itching or hives, swelling of the face, lips, or tongue  increased blood pressure  severe stomach pain  signs and symptoms of hgh blood sugar such as being more thirsty or hungry or having to urinate more than normal. You may also feel very tired or have blurry vision.  signs and symptoms of low blood   sugar such as feeling anxious; confusion; dizziness; increased hunger; unusually weak or tired; sweating; shakiness; cold; irritable; headache; blurred vision; fast heartbeat; loss of consciousness  unusually slow heartbeat Side effects that usually do not require medical  attention (report to your doctor or health care professional if they continue or are bothersome):  constipation  diarrhea  dizziness  headache  muscle pain  muscle spasms  nausea  pain, redness, or irritation at site where injected This list may not describe all possible side effects. Call your doctor for medical advice about side effects. You may report side effects to FDA at 1-800-FDA-1088. Where should I keep my medicine? This drug is given in a hospital or clinic and will not be stored at home. NOTE: This sheet is a summary. It may not cover all possible information. If you have questions about this medicine, talk to your doctor, pharmacist, or health care provider.  2020 Elsevier/Gold Standard (2018-02-27 09:13:08)  

## 2020-01-04 ENCOUNTER — Telehealth: Payer: Self-pay | Admitting: Oncology

## 2020-01-04 NOTE — Telephone Encounter (Signed)
Scheduled appts per 7/30 los. Pt confirmed appt date and time.  

## 2020-01-11 DIAGNOSIS — I2510 Atherosclerotic heart disease of native coronary artery without angina pectoris: Secondary | ICD-10-CM | POA: Diagnosis not present

## 2020-01-12 LAB — LIPID PANEL
Chol/HDL Ratio: 2.2 ratio (ref 0.0–5.0)
Cholesterol, Total: 82 mg/dL — ABNORMAL LOW (ref 100–199)
HDL: 37 mg/dL — ABNORMAL LOW (ref >39)
LDL Chol Calc (NIH): 29 mg/dL (ref 0–99)
Triglycerides: 72 mg/dL (ref 0–149)
VLDL Cholesterol Cal: 16 mg/dL (ref 5–40)

## 2020-01-12 LAB — LIPOPROTEIN A (LPA): Lipoprotein (a): 9.4 nmol/L (ref <75.0)

## 2020-01-18 ENCOUNTER — Encounter: Payer: Self-pay | Admitting: Cardiology

## 2020-01-18 ENCOUNTER — Other Ambulatory Visit: Payer: Self-pay

## 2020-01-18 ENCOUNTER — Ambulatory Visit: Payer: Federal, State, Local not specified - PPO | Admitting: Cardiology

## 2020-01-18 VITALS — BP 139/86 | HR 58 | Resp 17 | Ht 71.0 in | Wt 208.0 lb

## 2020-01-18 DIAGNOSIS — I10 Essential (primary) hypertension: Secondary | ICD-10-CM

## 2020-01-18 DIAGNOSIS — I2510 Atherosclerotic heart disease of native coronary artery without angina pectoris: Secondary | ICD-10-CM

## 2020-01-18 MED ORDER — TICAGRELOR 60 MG PO TABS: 60.0000 mg | ORAL_TABLET | Freq: Two times a day (BID) | ORAL | 3 refills | Status: DC

## 2020-01-18 MED ORDER — ASPIRIN 81 MG PO CHEW: 81.0000 mg | CHEWABLE_TABLET | Freq: Every day | ORAL | 3 refills | Status: DC

## 2020-01-18 MED ORDER — METOPROLOL SUCCINATE ER 25 MG PO TB24: 25.0000 mg | ORAL_TABLET | Freq: Two times a day (BID) | ORAL | 1 refills | Status: DC

## 2020-01-18 MED ORDER — NITROGLYCERIN 0.4 MG SL SUBL: 0.4000 mg | SUBLINGUAL_TABLET | SUBLINGUAL | 1 refills | Status: DC | PRN

## 2020-01-18 MED ORDER — ATORVASTATIN CALCIUM 40 MG PO TABS: 40.0000 mg | ORAL_TABLET | Freq: Every day | ORAL | 3 refills | Status: DC

## 2020-01-18 MED ORDER — AMLODIPINE BESYLATE 5 MG PO TABS: 5.0000 mg | ORAL_TABLET | Freq: Every day | ORAL | 3 refills | Status: DC

## 2020-01-18 NOTE — Progress Notes (Signed)
Subjective:   William Hancock, male    DOB: Aug 23, 1965, 54 y.o.   MRN: [id number]   Chief complaint:  Coronary artery disease   HPI  54 year old Caucasian male with hypertension, hyperlipidemia, coronary artery disease, s/p multivessel complex PCI (pro & mid RCA, prox-mid LAD/Diag bifurcation) for NSTEMI in 06/2018, metastatic neuroendocrine tumor  He denies chest pain, shortness of breath, palpitations, leg edema, orthopnea, PND, TIA/syncope. He has not been doing regular physical exercise through winter, but is starting back routine exercise. He has not had any bleeding issues while on DAPT.  Patient was diagnosed with metastatic neuroendocrine tumor (MET) in July 2020.  He was recently seen by his onoclogist Dr. Benay Spice and has been started on Lutathera, which he is tolerating well.   Reviewed recent labs with the patient.  Current Outpatient Medications on File Prior to Visit  Medication Sig Dispense Refill  . amLODipine (NORVASC) 5 MG tablet TAKE ONE TABLET BY MOUTH DAILY 90 tablet 0  . aspirin 81 MG chewable tablet CHEW ONE TABLET BY MOUTH DAILY 90 tablet 0  . atorvastatin (LIPITOR) 40 MG tablet TAKE ONE TABLET BY MOUTH DAILY 90 tablet 1  . levothyroxine (SYNTHROID) 125 MCG tablet Take 125 mcg by mouth daily.    . metoprolol succinate (TOPROL-XL) 25 MG 24 hr tablet TAKE TWO TABLETS BY MOUTH DAILY WITH OR IMMEDIATELY FOLLOWING A MEAL 180 tablet 1  . nitroGLYCERIN (NITROSTAT) 0.4 MG SL tablet Place 1 tablet (0.4 mg total) under the tongue every 5 (five) minutes x 3 doses as needed for chest pain. 25 tablet 1  . ticagrelor (BRILINTA) 60 MG TABS tablet Take 1 tablet (60 mg total) by mouth 2 (two) times daily. 180 tablet 3  . vitamin B-12 (CYANOCOBALAMIN) 500 MCG tablet Take 1,000 mcg by mouth daily.    . metoprolol tartrate (LOPRESSOR) 25 MG tablet Take 1 tablet (25 mg total) by mouth 2 (two) times daily. 180 tablet 3   No current facility-administered medications on file prior to  visit.    Cardiovascular studies:  EKG 01/18/2020: Sinus rhythm 60 bpm Left ventricular hyperytrophy First degree A-V block  Poor R wave progression  Cath 06/09/2018: LM: Normal  LAD: Prox to mid LAD/Diag1 bifurcation severe calcific 80% stenosis        Diag 1 mid 75% stenosis        Atherectomy and prox LAD into Diag         Synergy DES 2.75 X 16 mm DES        Overlapping Stent into prox LAD Synergy DES 3.0 X 38 mm        Provisional stent mid LAD with minicrush technique Synergy DES 2.75 X 16 mm DES Ramus: 50 % proximal disease LCx: Mild luminal irregularities RCA: Stenting performed 06/06/2018        Severe mid 99% stenosis--->Orsero 3.5 X 26 mm Orsero DES         Post dilatation with 4.0 mm [place] balloon        Severe prox 80% stenosis--->Orsero 3.5 X 9 mm DES        Post dilatation with 4.0 mm [place] balloon   Recommendation: DAPT with Aspirin and brilinta for 1 year Aggressive risk factor modification  Hospital echocardiogram 06/06/2018: - Left ventricle: The cavity size was normal. There was mild   concentric hypertrophy. Systolic function was normal. The   estimated ejection fraction was in the range of 55% to 60%. Wall   motion was normal; there  were no regional wall motion   abnormalities. Doppler parameters are consistent with abnormal   left ventricular relaxation (grade 1 diastolic dysfunction). - No significant valvular abnormality.  Recent labs: 01/01/2020: Glucose 99, BUN/Cr 18/1.6. EGFR 47. Na/K 143/4.6.  Chol 82, TG 72, HDL 37, LDL 29 Lipoprotein a 9 normal  06/09/2019: Glucose 86, BUN/Cr 19/1.41. EGFR 56. Na/K 141/4.8. Rest of the CMP normal H/H 14/43. MCV 92. Platelets 154 TSH 2.6 normal  12/18/2018: Chol 104, TG 63, HDL 49, LDL 42  Review of Systems  Review of Systems  Cardiovascular: Negative for chest pain, dyspnea on exertion, leg swelling, palpitations and syncope.         Vitals:   01/18/20 0822  BP: 139/86  Pulse: (!) 58  Resp: 17   SpO2: 97%     Objective:   Physical Exam  Physical Exam Vitals and nursing note reviewed.  Constitutional:      Appearance: He is well-developed.  Neck:     Vascular: No JVD.  Cardiovascular:     Rate and Rhythm: Normal rate and regular rhythm.     Pulses: Intact distal pulses.     Heart sounds: Normal heart sounds. No murmur heard.   Pulmonary:     Effort: Pulmonary effort is normal.     Breath sounds: Normal breath sounds. No wheezing or rales.            Assessment & Recommendations:   54 year old Caucasian male with hypertension, hyperlipidemia, coronary artery disease, s/p multivessel complex PCI (pro & mid RCA, prox-mid LAD/Diag bifurcation) for NSTEMI in 06/2018, metastatic neuroendocrine tumor   CAD: S/p multilvessel PCI for NSTEMI 06/2018 Doing well without angina symptoms. EKG today is unremarkable. In absence of bleeding, and history of complex stenting, recommended extended duration of dual antiplatelet therapy with Aspirin 81 mg daily, and Brilinta 60 mg bid. Brilinta can be interrupted at any time for any future biopsies/procedures, or any bleeding issues.  Continue metoprolol succinate, which she prefers taking 25 mg twice daily.   Continue amlodipine 5 mg daily, lipitor 40 mg daily. Continue lipitor 40 mg daily. LDL 29.  Hypertension: Well controlled.  Metastatic neuroendocrine tumor: Managed by Dr. Benay Spice.   F/u in 6 months.   Nigel Mormon, MD [facility] Pager: [phone] Office: [phone] If no answer Cell [phone]

## 2020-01-29 ENCOUNTER — Other Ambulatory Visit: Payer: Self-pay

## 2020-01-29 ENCOUNTER — Ambulatory Visit (HOSPITAL_COMMUNITY)
Admission: RE | Admit: 2020-01-29 | Discharge: 2020-01-29 | Disposition: A | Discharge status: Home / Self Care | Payer: Federal, State, Local not specified - PPO | Source: Ambulatory Visit | Attending: Oncology | Admitting: Oncology

## 2020-01-29 ENCOUNTER — Ambulatory Visit (HOSPITAL_COMMUNITY): Payer: Federal, State, Local not specified - PPO

## 2020-01-29 ENCOUNTER — Ambulatory Visit: Payer: Federal, State, Local not specified - PPO

## 2020-01-29 DIAGNOSIS — D3A098 Benign carcinoid tumors of other sites: Secondary | ICD-10-CM

## 2020-01-29 DIAGNOSIS — C7A1 Malignant poorly differentiated neuroendocrine tumors: Secondary | ICD-10-CM | POA: Diagnosis not present

## 2020-01-29 LAB — CBC WITH DIFFERENTIAL/PLATELET
Abs Immature Granulocytes: 0.04 10*3/uL (ref 0.00–0.07)
Basophils Absolute: 0.1 10*3/uL (ref 0.0–0.1)
Basophils Relative: 1 %
Eosinophils Absolute: 0.7 10*3/uL — ABNORMAL HIGH (ref 0.0–0.5)
Eosinophils Relative: 9 %
HCT: 42.5 % (ref 39.0–52.0)
Hemoglobin: 14.4 g/dL (ref 13.0–17.0)
Immature Granulocytes: 1 %
Lymphocytes Relative: 9 %
Lymphs Abs: 0.7 10*3/uL (ref 0.7–4.0)
MCH: 30.3 pg (ref 26.0–34.0)
MCHC: 33.9 g/dL (ref 30.0–36.0)
MCV: 89.5 fL (ref 80.0–100.0)
Monocytes Absolute: 0.8 10*3/uL (ref 0.1–1.0)
Monocytes Relative: 11 %
Neutro Abs: 5.3 10*3/uL (ref 1.7–7.7)
Neutrophils Relative %: 69 %
Platelets: 212 10*3/uL (ref 150–400)
RBC: 4.75 MIL/uL (ref 4.22–5.81)
RDW: 12.4 % (ref 11.5–15.5)
WBC: 7.7 10*3/uL (ref 4.0–10.5)
nRBC: 0 % (ref 0.0–0.2)

## 2020-01-29 LAB — COMPREHENSIVE METABOLIC PANEL
ALT: 22 U/L (ref 0–44)
AST: 24 U/L (ref 15–41)
Albumin: 3.7 g/dL (ref 3.5–5.0)
Alkaline Phosphatase: 146 U/L — ABNORMAL HIGH (ref 38–126)
Anion gap: 10 (ref 5–15)
BUN: 19 mg/dL (ref 6–20)
CO2: 23 mmol/L (ref 22–32)
Calcium: 9.4 mg/dL (ref 8.9–10.3)
Chloride: 106 mmol/L (ref 98–111)
Creatinine, Ser: 1.35 mg/dL — ABNORMAL HIGH (ref 0.61–1.24)
GFR calc Af Amer: >60 mL/min (ref >60)
GFR calc non Af Amer: 58 mL/min — ABNORMAL LOW (ref >60)
Glucose, Bld: 122 mg/dL — ABNORMAL HIGH (ref 70–99)
Potassium: 4.4 mmol/L (ref 3.5–5.1)
Sodium: 139 mmol/L (ref 135–145)
Total Bilirubin: 0.8 mg/dL (ref 0.3–1.2)
Total Protein: 7.2 g/dL (ref 6.5–8.1)

## 2020-01-29 IMAGING — NM NM [PERSON_NAME] ADMINISTRATION
1 series · 1 of 1 positions shown · non-contrast
Comparison: none

CLINICAL DATA: 54 year-old male with metastatic
neuroendocrine tumor. Well differentiated tumor with somatostatin
Receptor avidity identified within the liver and central mesentery
by
DOTATATE PET CT scan.

EXAM:
NUCLEAR MEDICINE LUTATHERA ADMINISTRATION
TECHNIQUE: Infusion: The nuclear medicine technologist and I personally
verified the dose activity (208 mCi) to be delivered as specified in
the written directive (200 mCi), and verified the patient
identification via 2 separate methods. 20 gauge IV were started in
the antecubital veins. Anti-emetics were administered by nursing
staff. JANIS acid renal protection was initiated 30 minutes prior to
Lu 177 DOTATATE (Lutathera) infusion and continued continuously for
4 hours. Lutathera infusion was administered over 30 minutes.

[Series 1: static · 2.07mm/px · 1 of 1 slices shown]
[im 1/1]
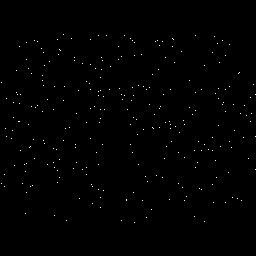

[1 of 1 positions shown; findings below may reference images not displayed]

The total administered dose was 204.8  mCi Lu 177 DOTATATE.

The entire IV tubing, venocatheter, stopcock and syringes was
removed in total, placed in a disposal bag and sent for assay of the
residual activity, which will be reported at a later time in our EMR
by the physics staff. Pressure was applied to the venipuncture
sites, and a compression bandage placed. Radiation Safety personnel
were present to perform the discharge survey, as detailed on their
documentation.

Patient received 30 mg IM long-acting Sandostatin injection 4 hours
after Lutathera effusion in the nuclear medicine department.

RADIOPHARMACEUTICALS:  204.8 mCi Lu 177 DOTATATE
FINDINGS: Diagnosis: Metastatic neuroendocrine tumor.

Current Infusion: 3

Planned Infusions: 4

Patient reports minimal interval symptoms following therapy. The
patient's most recent blood counts were reviewed and remains a good
candidate to proceed with Lutathera. The patient was situated in an
infusion suite and administered Lutathera as above. Patient will
follow-up with referring oncologist for interval serum laboratories
(CBC and CMP) in approximately 4 weeks.

Patient has persistent but improved chronic renal insufficiency with
creatinine equal 1.3 decreased from creatinine equal 1.6 four weeks
prior. GFR improved to 58 from 47.

Patient received 30 mg IM long-acting Sandostatin injection 4 hours
after Lutathera effusion in the nuclear medicine department.
IMPRESSION: Third Lu 177 DOTATATE treatment for metastatic neuroendocrine tumor.
The patient tolerated the infusion well. The patient will return in
8 weeks for ongoing care.

## 2020-01-29 MED ORDER — AMINO ACID RADIOPROTECTANT - L-LYSINE 2.5%/L-ARGININE 2.5% IN NS
250.0000 mL/h | INTRAVENOUS | Status: AC
  Administered 2020-01-29: 250 mL/h via INTRAVENOUS
  Filled 2020-01-29: qty 1000

## 2020-01-29 MED ORDER — OCTREOTIDE ACETATE 30 MG IM KIT: 30.0000 mg | PACK | Freq: Once | INTRAMUSCULAR | Status: DC

## 2020-01-29 MED ORDER — OCTREOTIDE ACETATE 500 MCG/ML IJ SOLN: 500.0000 ug | Freq: Once | INTRAMUSCULAR | Status: DC | PRN

## 2020-01-29 MED ORDER — ONDANSETRON HCL 8 MG PO TABS
8.0000 mg | ORAL_TABLET | Freq: Two times a day (BID) | ORAL | 0 refills | Status: DC | PRN
Start: 2020-01-29 — End: 2020-03-25

## 2020-01-29 MED ORDER — PROCHLORPERAZINE EDISYLATE 10 MG/2ML IJ SOLN: 10.0000 mg | Freq: Four times a day (QID) | INTRAMUSCULAR | Status: DC | PRN

## 2020-01-29 MED ORDER — SODIUM CHLORIDE 0.9 % IV SOLN
8.0000 mg | Freq: Once | INTRAVENOUS | Status: AC
  Administered 2020-01-29: 8 mg via INTRAVENOUS
  Filled 2020-01-29: qty 4

## 2020-01-29 MED ORDER — LANREOTIDE ACETATE 120 MG/0.5ML ~~LOC~~ SOLN
SUBCUTANEOUS | Status: AC
  Filled 2020-01-29: qty 120

## 2020-01-29 MED ORDER — SODIUM CHLORIDE 0.9 % IV SOLN: 500.0000 mL | Freq: Once | INTRAVENOUS | Status: DC

## 2020-01-29 MED ORDER — LUTETIUM LU 177 DOTATATE 370 MBQ/ML IV SOLN
200.0000 | Freq: Once | INTRAVENOUS | Status: AC
  Administered 2020-01-29: 204.8 via INTRAVENOUS

## 2020-01-29 MED ORDER — LANREOTIDE ACETATE 120 MG/0.5ML ~~LOC~~ SOLN
120.0000 mg | Freq: Once | SUBCUTANEOUS | Status: AC
  Administered 2020-01-29: 120 mg via SUBCUTANEOUS

## 2020-01-29 NOTE — Progress Notes (Signed)
RADIOPHARMACEUTICALS:  [204.8] mCi Lu 177 DOTATATE  FINDINGS: Diagnosis: [Metastatic neuroendocrine tumor.]  Current Infusion: [3]  Planned Infusions: [4]  Patient reports [minimal] interval symptoms following therapy. The patient's most recent blood counts were reviewed and remains a good candidate to proceed with Lutathera. The patient was situated in an infusion suite and administered Lutathera as above. Patient will follow-up with referring oncologist for interval serum laboratories (CBC and CMP) in approximately 4 weeks.   Patient has persistent but improved chronic renal insufficiency with creatinine equal 1.3 decreased from creatinine equal 1.6 four  weeks prior.    Patient received 30 mg IM long-acting Sandostatin injection 4 hours after Lutathera effusion in the nuclear medicine department.   IMPRESSION: [Third] FP 825 [id number] treatment for metastatic neuroendocrine tumor. The patient tolerated the infusion well. The patient will return in 8 weeks for ongoing care.

## 2020-01-29 NOTE — Progress Notes (Signed)
The patient completed today's treatment without complication. Vitals remained within defined limits. No acute distress. Discharge instructions to follow by Leonia Reeves, MD.

## 2020-02-23 DIAGNOSIS — Z0001 Encounter for general adult medical examination with abnormal findings: Secondary | ICD-10-CM | POA: Diagnosis not present

## 2020-02-23 DIAGNOSIS — Z23 Encounter for immunization: Secondary | ICD-10-CM | POA: Diagnosis not present

## 2020-02-26 ENCOUNTER — Inpatient Hospital Stay: Payer: Federal, State, Local not specified - PPO

## 2020-02-26 ENCOUNTER — Ambulatory Visit: Payer: Federal, State, Local not specified - PPO

## 2020-02-26 ENCOUNTER — Other Ambulatory Visit: Payer: Self-pay

## 2020-02-26 ENCOUNTER — Inpatient Hospital Stay: Payer: Federal, State, Local not specified - PPO | Attending: Nurse Practitioner

## 2020-02-26 ENCOUNTER — Encounter: Payer: Self-pay | Admitting: Nurse Practitioner

## 2020-02-26 ENCOUNTER — Inpatient Hospital Stay: Payer: Federal, State, Local not specified - PPO | Admitting: Nurse Practitioner

## 2020-02-26 VITALS — BP 120/80 | HR 63 | Temp 96.6°F | Resp 18 | Ht 71.0 in | Wt 209.4 lb

## 2020-02-26 DIAGNOSIS — R197 Diarrhea, unspecified: Secondary | ICD-10-CM | POA: Diagnosis not present

## 2020-02-26 DIAGNOSIS — Z79899 Other long term (current) drug therapy: Secondary | ICD-10-CM | POA: Insufficient documentation

## 2020-02-26 DIAGNOSIS — R232 Flushing: Secondary | ICD-10-CM | POA: Diagnosis not present

## 2020-02-26 DIAGNOSIS — N289 Disorder of kidney and ureter, unspecified: Secondary | ICD-10-CM | POA: Diagnosis not present

## 2020-02-26 DIAGNOSIS — I10 Essential (primary) hypertension: Secondary | ICD-10-CM | POA: Insufficient documentation

## 2020-02-26 DIAGNOSIS — D3A098 Benign carcinoid tumors of other sites: Secondary | ICD-10-CM

## 2020-02-26 DIAGNOSIS — C7B02 Secondary carcinoid tumors of liver: Secondary | ICD-10-CM | POA: Insufficient documentation

## 2020-02-26 DIAGNOSIS — I252 Old myocardial infarction: Secondary | ICD-10-CM | POA: Insufficient documentation

## 2020-02-26 DIAGNOSIS — E039 Hypothyroidism, unspecified: Secondary | ICD-10-CM | POA: Insufficient documentation

## 2020-02-26 DIAGNOSIS — I2510 Atherosclerotic heart disease of native coronary artery without angina pectoris: Secondary | ICD-10-CM | POA: Diagnosis not present

## 2020-02-26 DIAGNOSIS — C7A098 Malignant carcinoid tumors of other sites: Secondary | ICD-10-CM | POA: Diagnosis not present

## 2020-02-26 LAB — CBC WITH DIFFERENTIAL (CANCER CENTER ONLY)
Abs Immature Granulocytes: 0.03 10*3/uL (ref 0.00–0.07)
Basophils Absolute: 0.1 10*3/uL (ref 0.0–0.1)
Basophils Relative: 1 %
Eosinophils Absolute: 0.5 10*3/uL (ref 0.0–0.5)
Eosinophils Relative: 6 %
HCT: 42.1 % (ref 39.0–52.0)
Hemoglobin: 14.1 g/dL (ref 13.0–17.0)
Immature Granulocytes: 0 %
Lymphocytes Relative: 7 %
Lymphs Abs: 0.5 10*3/uL — ABNORMAL LOW (ref 0.7–4.0)
MCH: 30.1 pg (ref 26.0–34.0)
MCHC: 33.5 g/dL (ref 30.0–36.0)
MCV: 90 fL (ref 80.0–100.0)
Monocytes Absolute: 0.8 10*3/uL (ref 0.1–1.0)
Monocytes Relative: 11 %
Neutro Abs: 5.5 10*3/uL (ref 1.7–7.7)
Neutrophils Relative %: 75 %
Platelet Count: 175 10*3/uL (ref 150–400)
RBC: 4.68 MIL/uL (ref 4.22–5.81)
RDW: 12.8 % (ref 11.5–15.5)
WBC Count: 7.3 10*3/uL (ref 4.0–10.5)
nRBC: 0 % (ref 0.0–0.2)

## 2020-02-26 LAB — CMP (CANCER CENTER ONLY)
ALT: 22 U/L (ref 0–44)
AST: 20 U/L (ref 15–41)
Albumin: 3.6 g/dL (ref 3.5–5.0)
Alkaline Phosphatase: 173 U/L — ABNORMAL HIGH (ref 38–126)
Anion gap: 8 (ref 5–15)
BUN: 15 mg/dL (ref 6–20)
CO2: 27 mmol/L (ref 22–32)
Calcium: 9.8 mg/dL (ref 8.9–10.3)
Chloride: 107 mmol/L (ref 98–111)
Creatinine: 1.31 mg/dL — ABNORMAL HIGH (ref 0.61–1.24)
GFR, Est AFR Am: >60 mL/min (ref >60)
GFR, Estimated: >60 mL/min (ref >60)
Glucose, Bld: 104 mg/dL — ABNORMAL HIGH (ref 70–99)
Potassium: 4.2 mmol/L (ref 3.5–5.1)
Sodium: 142 mmol/L (ref 135–145)
Total Bilirubin: 0.7 mg/dL (ref 0.3–1.2)
Total Protein: 7.2 g/dL (ref 6.5–8.1)

## 2020-02-26 MED ORDER — LANREOTIDE ACETATE 120 MG/0.5ML ~~LOC~~ SOLN
120.0000 mg | Freq: Once | SUBCUTANEOUS | Status: AC
  Administered 2020-02-26: 120 mg via SUBCUTANEOUS
  Filled 2020-02-26: qty 120

## 2020-02-26 NOTE — Patient Instructions (Signed)
Lanreotide injection What is this medicine? LANREOTIDE (lan REE oh tide) is used to reduce blood levels of growth hormone in patients with a condition called acromegaly. It also works to slow or stop tumor growth in patients with neuroendocrine tumors and treat carcinoid syndrome. This medicine may be used for other purposes; ask your health care provider or pharmacist if you have questions. COMMON BRAND NAME(S): Somatuline [place] What should I tell my health care provider before I take this medicine? They need to know if you have any of these conditions:  diabetes  gallbladder disease  heart disease  kidney disease  liver disease  thyroid disease  an unusual or allergic reaction to lanreotide, other medicines, foods, dyes, or preservatives  pregnant or trying to get pregnant  breast-feeding How should I use this medicine? This medicine is for injection under the skin. It is given by a health care professional in a hospital or clinic setting. Contact your pediatrician or health care professional regarding the use of this medicine in children. Special care may be needed. Overdosage: If you think you have taken too much of this medicine contact a poison control center or emergency room at once. NOTE: This medicine is only for you. Do not share this medicine with others. What if I miss a dose? It is important not to miss your dose. Call your doctor or health care professional if you are unable to keep an appointment. What may interact with this medicine? This medicine may interact with the following medications:  bromocriptine  cyclosporine  certain medicines for blood pressure, heart disease, irregular heart beat  certain medicines for diabetes  quinidine  terfenadine This list may not describe all possible interactions. Give your health care provider a list of all the medicines, herbs, non-prescription drugs, or dietary supplements you use. Also tell them if you smoke,  drink alcohol, or use illegal drugs. Some items may interact with your medicine. What should I watch for while using this medicine? Tell your doctor or healthcare professional if your symptoms do not start to get better or if they get worse. Visit your doctor or health care professional for regular checks on your progress. Your condition will be monitored carefully while you are receiving this medicine. This medicine may increase blood sugar. Ask your healthcare provider if changes in diet or medicines are needed if you have diabetes. You may need blood work done while you are taking this medicine. Women should inform their doctor if they wish to become pregnant or think they might be pregnant. There is a potential for serious side effects to an unborn child. Talk to your health care professional or pharmacist for more information. Do not breast-feed an infant while taking this medicine or for 6 months after stopping it. This medicine has caused ovarian failure in some women. This medicine may interfere with the ability to have a child. Talk with your doctor or health care professional if you are concerned about your fertility. What side effects may I notice from receiving this medicine? Side effects that you should report to your doctor or health care professional as soon as possible:  allergic reactions like skin rash, itching or hives, swelling of the face, lips, or tongue  increased blood pressure  severe stomach pain  signs and symptoms of hgh blood sugar such as being more thirsty or hungry or having to urinate more than normal. You may also feel very tired or have blurry vision.  signs and symptoms of low blood   sugar such as feeling anxious; confusion; dizziness; increased hunger; unusually weak or tired; sweating; shakiness; cold; irritable; headache; blurred vision; fast heartbeat; loss of consciousness  unusually slow heartbeat Side effects that usually do not require medical  attention (report to your doctor or health care professional if they continue or are bothersome):  constipation  diarrhea  dizziness  headache  muscle pain  muscle spasms  nausea  pain, redness, or irritation at site where injected This list may not describe all possible side effects. Call your doctor for medical advice about side effects. You may report side effects to FDA at 1-800-FDA-1088. Where should I keep my medicine? This drug is given in a hospital or clinic and will not be stored at home. NOTE: This sheet is a summary. It may not cover all possible information. If you have questions about this medicine, talk to your doctor, pharmacist, or health care provider.  2020 Elsevier/Gold Standard (2018-02-27 09:13:08)  

## 2020-02-26 NOTE — Progress Notes (Signed)
  [place] OFFICE PROGRESS NOTE   Diagnosis: Carcinoid tumor  INTERVAL HISTORY:   William Hancock returns as scheduled.  He completed the third treatment with Lutathera 01/29/2020.  He denies nausea.  He has occasional diarrhea.  He notes flushing with the first sip of alcohol.  He denies abdominal pain. He notes easy bruising, bleeding with mild trauma.  Attributes to a combination of Brilinta and aspirin.    Objective:  Vital signs in last 24 hours:  Blood pressure 120/80, pulse 63, temperature (!) 96.6 F (35.9 C), temperature source Tympanic, resp. rate 18, height $RemoveBe'5\' 11"'MSKcTRwmf$  (1.803 m), weight 209 lb 6.4 oz (95 kg), SpO2 100 %.    Resp: Lungs clear bilaterally. Cardio: Regular rate and rhythm. GI: Abdomen soft and nontender.  Liver edge is palpable. Vascular: No leg edema.   Lab Results:  Lab Results  Component Value Date   WBC 7.3 02/26/2020   HGB 14.1 02/26/2020   HCT 42.1 02/26/2020   MCV 90.0 02/26/2020   PLT 175 02/26/2020   NEUTROABS 5.5 02/26/2020    Imaging:  No results found.  Medications: I have reviewed the patient's current medications.  Assessment/Plan: 1. Metastatic neuroendocrine tumor, well-differentiated  10/31/2018 Abdominal ultrasound-numerous nearly confluent heterogeneous masses measuring up to 3.5 cm.   11/04/2018 CT abdomen/pelvis-multiple enhancing hepatic lesions and a central partially calcified spiculated mesenteric mass measuring 3.1 x 3.1 cm.   11/13/2018 biopsy liver lesion-metastatic well-differentiated neuroendocrine tumor positive for CKAE1-AE3, synaptophysin, chromogranin, CD56 and CDX-2; Ki-67 labeling index less than 3%; TTF-1 negative  Netspot 12/12/2018-increased activity associated a lobular mesenteric mass, small bowel medial to the mesenteric mass, and multiple liver lesions  Elevated chromogranin A level and 24-hour urine 5 HIAA  Monthly lanreotide beginning 12/23/2018  CT 05/13/2019-multiple large hepatic  metastases on the dotatate are poorly visualized, several small lesions are decreased in size 1 new lesion, stable mesenteric mass  Netspot 09/04/2019-increase in size of left and right liver lesions, stable to slightly decreased radiotracer accumulation, stable central mesenteric mass, no new metastatic lesions  Lutathera 10/13/2019  Lutathera 12/04/2019  Lutathera 01/29/2020  Lutathera 03/25/2020 2. History of MI/stent placement 3. CAD 4. Hypertension 5. Renal insufficiency 6. Hypothyroidism diagnosed with markedly elevated TSH and low T4 on 12/18/2018  Disposition: William Hancock appears stable.  He continues monthly lanreotide.  He is scheduled for the fourth and final treatment with Lutathera 03/25/2020.  We reviewed the CBC and chemistry panel from today.  He has stable mild elevation of the creatinine.  Calcium is in normal range.  Plan for Covid booster in approximately 2 weeks.  He will return for lab, follow-up, lanreotide 04/22/2020.  Plan reviewed with Dr. Benay Spice.    Humphrey Guerreiro ANP/GNP-BC   02/26/2020  9:42 AM

## 2020-02-29 ENCOUNTER — Other Ambulatory Visit (HOSPITAL_COMMUNITY): Payer: Self-pay | Admitting: Oncology

## 2020-02-29 ENCOUNTER — Encounter: Payer: Self-pay | Admitting: Oncology

## 2020-02-29 ENCOUNTER — Telehealth: Payer: Self-pay | Admitting: Oncology

## 2020-02-29 DIAGNOSIS — D3A098 Benign carcinoid tumors of other sites: Secondary | ICD-10-CM

## 2020-02-29 DIAGNOSIS — M25522 Pain in left elbow: Secondary | ICD-10-CM | POA: Diagnosis not present

## 2020-02-29 NOTE — Telephone Encounter (Signed)
Scheduled appointments per 9/24 los. Called patient, no answer. Left message for patient with appointments dates and times.

## 2020-03-01 LAB — CHROMOGRANIN A: Chromogranin A (ng/mL): 2459 ng/mL — ABNORMAL HIGH (ref 0.0–101.8)

## 2020-03-04 DIAGNOSIS — M25522 Pain in left elbow: Secondary | ICD-10-CM | POA: Diagnosis not present

## 2020-03-14 ENCOUNTER — Encounter: Payer: Self-pay | Admitting: Oncology

## 2020-03-15 ENCOUNTER — Other Ambulatory Visit: Payer: Self-pay

## 2020-03-15 ENCOUNTER — Inpatient Hospital Stay: Payer: Federal, State, Local not specified - PPO | Attending: Nurse Practitioner

## 2020-03-15 DIAGNOSIS — Z23 Encounter for immunization: Secondary | ICD-10-CM

## 2020-03-15 DIAGNOSIS — C7A098 Malignant carcinoid tumors of other sites: Secondary | ICD-10-CM | POA: Diagnosis not present

## 2020-03-15 DIAGNOSIS — C7B02 Secondary carcinoid tumors of liver: Secondary | ICD-10-CM | POA: Insufficient documentation

## 2020-03-17 ENCOUNTER — Encounter: Payer: Self-pay | Admitting: *Deleted

## 2020-03-18 ENCOUNTER — Telehealth: Payer: Self-pay | Admitting: *Deleted

## 2020-03-18 NOTE — Telephone Encounter (Signed)
Asking to see Dr. Benay Spice before he has his next lutathera/lanreotide injection on 03/25/20 to discuss going on Prednisone day of and for few days after each injection due to bone pain. Informed him that Dr. Benay Spice is not able to see him prior to 10/22 and that he suggests he discuss this with Dr. Ilda Foil on 03/25/20. Will discuss w/Dr. Benay Spice at November visit.

## 2020-03-23 DIAGNOSIS — E039 Hypothyroidism, unspecified: Secondary | ICD-10-CM | POA: Diagnosis not present

## 2020-03-25 ENCOUNTER — Ambulatory Visit (HOSPITAL_COMMUNITY): Payer: Federal, State, Local not specified - PPO

## 2020-03-25 ENCOUNTER — Ambulatory Visit (HOSPITAL_COMMUNITY)
Admission: RE | Admit: 2020-03-25 | Discharge: 2020-03-25 | Disposition: A | Discharge status: Home / Self Care | Payer: Federal, State, Local not specified - PPO | Source: Ambulatory Visit | Attending: Oncology | Admitting: Oncology

## 2020-03-25 ENCOUNTER — Other Ambulatory Visit: Payer: Self-pay

## 2020-03-25 DIAGNOSIS — D3A098 Benign carcinoid tumors of other sites: Secondary | ICD-10-CM | POA: Insufficient documentation

## 2020-03-25 DIAGNOSIS — C7B8 Other secondary neuroendocrine tumors: Secondary | ICD-10-CM | POA: Diagnosis not present

## 2020-03-25 LAB — CBC WITH DIFFERENTIAL/PLATELET
Abs Immature Granulocytes: 0.04 10*3/uL (ref 0.00–0.07)
Basophils Absolute: 0.1 10*3/uL (ref 0.0–0.1)
Basophils Relative: 1 %
Eosinophils Absolute: 0.4 10*3/uL (ref 0.0–0.5)
Eosinophils Relative: 8 %
HCT: 37.6 % — ABNORMAL LOW (ref 39.0–52.0)
Hemoglobin: 13 g/dL (ref 13.0–17.0)
Immature Granulocytes: 1 %
Lymphocytes Relative: 18 %
Lymphs Abs: 0.8 10*3/uL (ref 0.7–4.0)
MCH: 30.5 pg (ref 26.0–34.0)
MCHC: 34.6 g/dL (ref 30.0–36.0)
MCV: 88.3 fL (ref 80.0–100.0)
Monocytes Absolute: 0.5 10*3/uL (ref 0.1–1.0)
Monocytes Relative: 11 %
Neutro Abs: 2.9 10*3/uL (ref 1.7–7.7)
Neutrophils Relative %: 61 %
Platelets: 165 10*3/uL (ref 150–400)
RBC: 4.26 MIL/uL (ref 4.22–5.81)
RDW: 13 % (ref 11.5–15.5)
WBC: 4.8 10*3/uL (ref 4.0–10.5)
nRBC: 0 % (ref 0.0–0.2)

## 2020-03-25 LAB — COMPREHENSIVE METABOLIC PANEL
ALT: 31 U/L (ref 0–44)
AST: 30 U/L (ref 15–41)
Albumin: 4.2 g/dL (ref 3.5–5.0)
Alkaline Phosphatase: 137 U/L — ABNORMAL HIGH (ref 38–126)
Anion gap: 7 (ref 5–15)
BUN: 19 mg/dL (ref 6–20)
CO2: 19 mmol/L — ABNORMAL LOW (ref 22–32)
Calcium: 9.3 mg/dL (ref 8.9–10.3)
Chloride: 113 mmol/L — ABNORMAL HIGH (ref 98–111)
Creatinine, Ser: 1.37 mg/dL — ABNORMAL HIGH (ref 0.61–1.24)
GFR, Estimated: >60 mL/min (ref >60)
Glucose, Bld: 154 mg/dL — ABNORMAL HIGH (ref 70–99)
Potassium: 4 mmol/L (ref 3.5–5.1)
Sodium: 139 mmol/L (ref 135–145)
Total Bilirubin: 0.9 mg/dL (ref 0.3–1.2)
Total Protein: 7 g/dL (ref 6.5–8.1)

## 2020-03-25 MED ORDER — ONDANSETRON HCL 8 MG PO TABS: 8.0000 mg | ORAL_TABLET | Freq: Two times a day (BID) | ORAL | 0 refills | Status: DC | PRN

## 2020-03-25 MED ORDER — SODIUM CHLORIDE 0.9 % IV SOLN
8.0000 mg | Freq: Once | INTRAVENOUS | Status: AC
  Administered 2020-03-25: 8 mg via INTRAVENOUS
  Filled 2020-03-25: qty 4

## 2020-03-25 MED ORDER — OCTREOTIDE ACETATE 500 MCG/ML IJ SOLN: 500.0000 ug | Freq: Once | INTRAMUSCULAR | Status: DC | PRN

## 2020-03-25 MED ORDER — LUTETIUM LU 177 DOTATATE 370 MBQ/ML IV SOLN
200.0000 | Freq: Once | INTRAVENOUS | Status: AC
  Administered 2020-03-25: 201.55 via INTRAVENOUS

## 2020-03-25 MED ORDER — AMINO ACID RADIOPROTECTANT - L-LYSINE 2.5%/L-ARGININE 2.5% IN NS
250.0000 mL/h | INTRAVENOUS | Status: AC
  Administered 2020-03-25: 250 mL/h via INTRAVENOUS
  Filled 2020-03-25: qty 1000

## 2020-03-25 MED ORDER — SODIUM CHLORIDE 0.9 % IV SOLN: 500.0000 mL | Freq: Once | INTRAVENOUS | Status: DC

## 2020-03-25 MED ORDER — PROCHLORPERAZINE EDISYLATE 10 MG/2ML IJ SOLN: 10.0000 mg | Freq: Four times a day (QID) | INTRAMUSCULAR | Status: DC | PRN

## 2020-03-25 NOTE — Progress Notes (Signed)
CLINICAL DATA: [age] year[age]old [male] with metastatic neuroendocrine tumor. Well differentiated tumor with somatostatin receptor is identified within the [liver in central mesentery] by DOTATATE PET CT scan. EXAM: NUCLEAR MEDICINE LUTATHERA ADMINISTRATION TECHNIQUE: Infusion: The nuclear medicine technologist and I personally verified the dose activity ([204] mCi) to be delivered as specified in the written directive (200 mCi), and verified the patient identification via 2 separate methods. 20 gauge IV were started in the antecubital veins. Anti-emetics were administered by nursing staff. Amino acid renal protection was initiated 30 minutes prior to Lu 177 DOTATATE (Lutathera) infusion and continued continuously for 4 hours. Lutathera infusion was administered over 30 minutes.   The total administered dose was [201.6] mCi Lu 177 DOTATATE.  The entire IV tubing, venocatheter, stopcock and syringes was removed in total, placed in a disposal bag and sent for assay of the residual activity, which will be reported at a later time in our EMR by the physics staff. Pressure was applied to the venipuncture sites, and a compression bandage placed. Radiation Safety personnel were present to perform the discharge survey, as detailed on their documentation.   RADIOPHARMACEUTICALS:  [201.6] mCi Lu 177 DOTATATE  FINDINGS: Diagnosis: [Metastatic neuroendocrine tumor.]  Current Infusion: [Fourth   Planned Infusions: [4]  Patient reports [minimal] interval symptoms following therapy.  Patient did report ankle pain and elbow pain which may be risk associated with lanreotide injections.   The patient's most recent blood counts were reviewed and remains a good candidate to proceed with Lutathera.  Mild renal insufficiency remains unchanged.    The patient was situated in an infusion suite and administered Lutathera as above. Patient will follow-up with referring oncologist for interval serum  laboratories (CBC and CMP) in approximately 4 weeks.   Lanreotide injection was omitted due to potential associated arthralgia.   IMPRESSION: [Fourth] EB [id number] treatment for metastatic neuroendocrine tumor. The patient tolerated the infusion well.   The patient will return in approximately 1 month for gallium 68 DOTATATE PET scan and follow-up consult.

## 2020-03-25 NOTE — Progress Notes (Signed)
Tolerated Lutathera treatment well, no issues.

## 2020-04-20 ENCOUNTER — Encounter: Payer: Self-pay | Admitting: Oncology

## 2020-04-22 ENCOUNTER — Inpatient Hospital Stay: Payer: Federal, State, Local not specified - PPO | Admitting: Oncology

## 2020-04-22 ENCOUNTER — Inpatient Hospital Stay: Payer: Federal, State, Local not specified - PPO

## 2020-04-22 ENCOUNTER — Other Ambulatory Visit: Payer: Self-pay

## 2020-04-22 ENCOUNTER — Ambulatory Visit (HOSPITAL_COMMUNITY)
Admission: RE | Admit: 2020-04-22 | Discharge: 2020-04-22 | Disposition: A | Discharge status: Home / Self Care | Payer: Federal, State, Local not specified - PPO | Source: Ambulatory Visit | Attending: Oncology | Admitting: Oncology

## 2020-04-22 ENCOUNTER — Inpatient Hospital Stay: Payer: Federal, State, Local not specified - PPO | Attending: Nurse Practitioner

## 2020-04-22 VITALS — BP 113/72 | HR 61 | Temp 97.7°F | Resp 16 | Ht 71.0 in | Wt 212.3 lb

## 2020-04-22 DIAGNOSIS — D3A098 Benign carcinoid tumors of other sites: Secondary | ICD-10-CM

## 2020-04-22 DIAGNOSIS — I2510 Atherosclerotic heart disease of native coronary artery without angina pectoris: Secondary | ICD-10-CM | POA: Diagnosis not present

## 2020-04-22 DIAGNOSIS — E039 Hypothyroidism, unspecified: Secondary | ICD-10-CM | POA: Insufficient documentation

## 2020-04-22 DIAGNOSIS — K7689 Other specified diseases of liver: Secondary | ICD-10-CM | POA: Diagnosis not present

## 2020-04-22 DIAGNOSIS — N289 Disorder of kidney and ureter, unspecified: Secondary | ICD-10-CM | POA: Diagnosis not present

## 2020-04-22 DIAGNOSIS — C7B02 Secondary carcinoid tumors of liver: Secondary | ICD-10-CM | POA: Insufficient documentation

## 2020-04-22 DIAGNOSIS — I252 Old myocardial infarction: Secondary | ICD-10-CM | POA: Insufficient documentation

## 2020-04-22 DIAGNOSIS — C7A098 Malignant carcinoid tumors of other sites: Secondary | ICD-10-CM | POA: Diagnosis not present

## 2020-04-22 DIAGNOSIS — C787 Secondary malignant neoplasm of liver and intrahepatic bile duct: Secondary | ICD-10-CM | POA: Diagnosis not present

## 2020-04-22 DIAGNOSIS — I10 Essential (primary) hypertension: Secondary | ICD-10-CM | POA: Insufficient documentation

## 2020-04-22 DIAGNOSIS — Z79899 Other long term (current) drug therapy: Secondary | ICD-10-CM | POA: Diagnosis not present

## 2020-04-22 DIAGNOSIS — C7A1 Malignant poorly differentiated neuroendocrine tumors: Secondary | ICD-10-CM | POA: Diagnosis not present

## 2020-04-22 LAB — CBC WITH DIFFERENTIAL (CANCER CENTER ONLY)
Abs Immature Granulocytes: 0.03 10*3/uL (ref 0.00–0.07)
Basophils Absolute: 0.1 10*3/uL (ref 0.0–0.1)
Basophils Relative: 1 %
Eosinophils Absolute: 0.5 10*3/uL (ref 0.0–0.5)
Eosinophils Relative: 8 %
HCT: 42.3 % (ref 39.0–52.0)
Hemoglobin: 14 g/dL (ref 13.0–17.0)
Immature Granulocytes: 1 %
Lymphocytes Relative: 8 %
Lymphs Abs: 0.5 10*3/uL — ABNORMAL LOW (ref 0.7–4.0)
MCH: 30 pg (ref 26.0–34.0)
MCHC: 33.1 g/dL (ref 30.0–36.0)
MCV: 90.8 fL (ref 80.0–100.0)
Monocytes Absolute: 0.5 10*3/uL (ref 0.1–1.0)
Monocytes Relative: 9 %
Neutro Abs: 4.6 10*3/uL (ref 1.7–7.7)
Neutrophils Relative %: 73 %
Platelet Count: 153 10*3/uL (ref 150–400)
RBC: 4.66 MIL/uL (ref 4.22–5.81)
RDW: 13.2 % (ref 11.5–15.5)
WBC Count: 6.2 10*3/uL (ref 4.0–10.5)
nRBC: 0 % (ref 0.0–0.2)

## 2020-04-22 LAB — CMP (CANCER CENTER ONLY)
ALT: 29 U/L (ref 0–44)
AST: 28 U/L (ref 15–41)
Albumin: 3.8 g/dL (ref 3.5–5.0)
Alkaline Phosphatase: 153 U/L — ABNORMAL HIGH (ref 38–126)
Anion gap: 10 (ref 5–15)
BUN: 18 mg/dL (ref 6–20)
CO2: 25 mmol/L (ref 22–32)
Calcium: 9.9 mg/dL (ref 8.9–10.3)
Chloride: 108 mmol/L (ref 98–111)
Creatinine: 1.54 mg/dL — ABNORMAL HIGH (ref 0.61–1.24)
GFR, Estimated: 53 mL/min — ABNORMAL LOW (ref >60)
Glucose, Bld: 148 mg/dL — ABNORMAL HIGH (ref 70–99)
Potassium: 4.6 mmol/L (ref 3.5–5.1)
Sodium: 143 mmol/L (ref 135–145)
Total Bilirubin: 0.9 mg/dL (ref 0.3–1.2)
Total Protein: 7 g/dL (ref 6.5–8.1)

## 2020-04-22 IMAGING — CT NM PET SKULL BASE TO THIGH
8 series · 25 of 25 positions shown · non-contrast
Comparison: DOTATATE PET scan [DATE], [DATE]

CLINICAL DATA: Well differentiated neuroendocrine tumor with liver
metastasis. Patient status post 4 cycles peptide receptor
radiotherapy (Lu 177 DOTATATE) with last therapy on [DATE].

EXAM:
NUCLEAR MEDICINE PET SKULL BASE TO THIGH
TECHNIQUE: 5.2 mCi gallium 68 DOTATATE was injected intravenously. Full-ring
PET imaging was performed from the skull base to thigh after the
radiotracer. CT data was obtained and used for attenuation
correction and anatomic localization.

[Series 3: pet sk_thigh ac · axial · 5.0mm · 4.07mm/px · z∈[-1113,-137]mm · 6 of 245 slices shown]
[im 1/245]
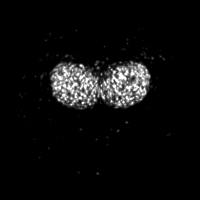
[im 49/245]
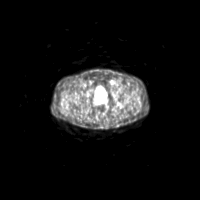
[im 98/245]
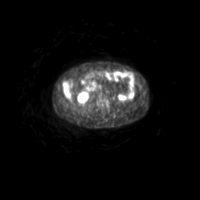
[im 147/245]
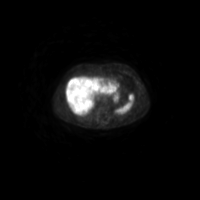
[im 196/245]
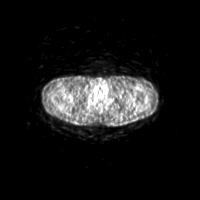
[im 245/245]
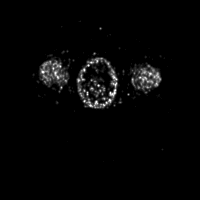

[Series 4: ct sk_thigh 5.0 bf37 · axial · 5.0mm · 0.98mm/px · z∈[-1113,-137]mm · 5 of 245 slices shown]
[im 1/245]
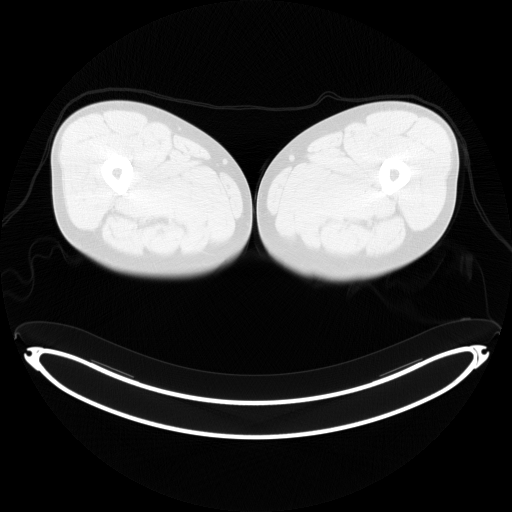
[im 62/245]
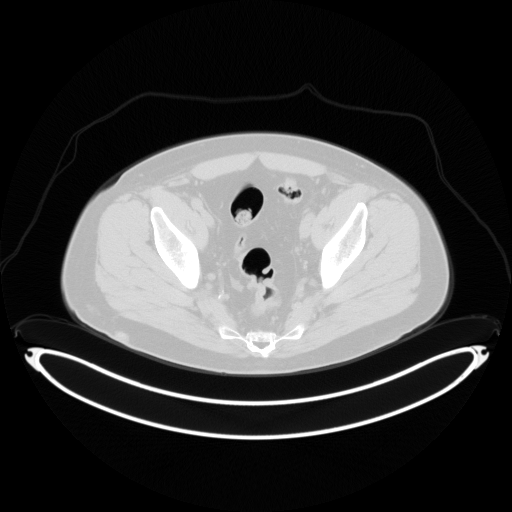
[im 123/245]
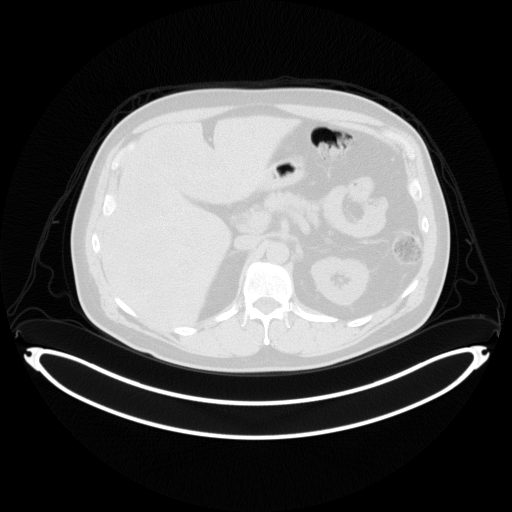
[im 184/245]
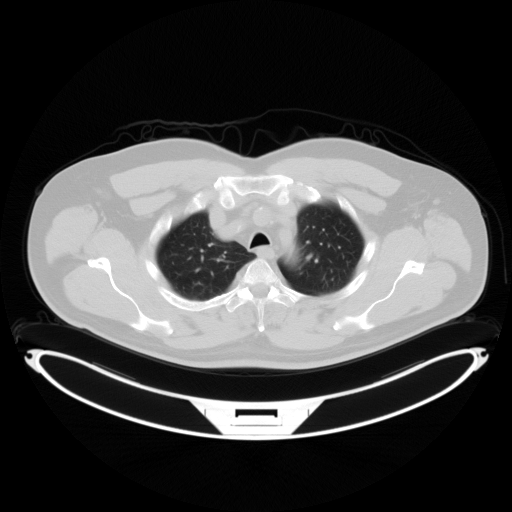
[im 245/245  brain]
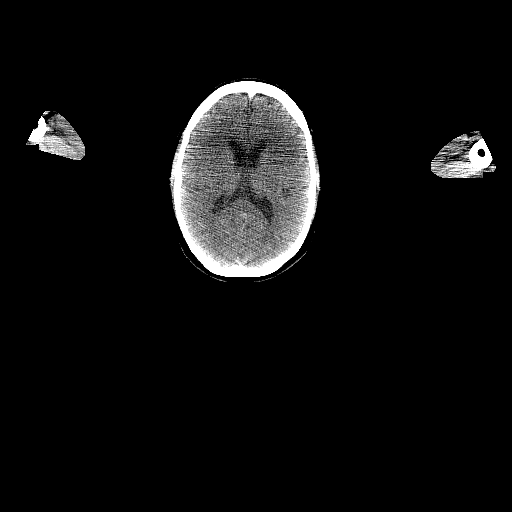

[Series 5: pet sk_thigh nac · axial · 5.0mm · 4.07mm/px · z∈[-1113,-137]mm · 5 of 245 slices shown]
[im 1/245]
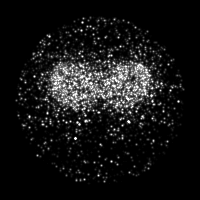
[im 62/245]
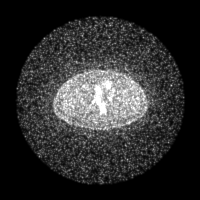
[im 123/245]
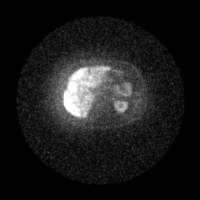
[im 184/245]
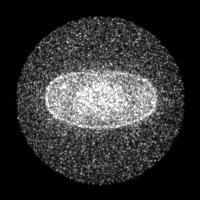
[im 245/245]
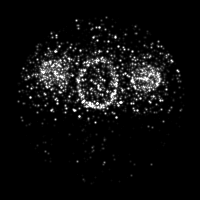

[Series 8: ct sk_thigh 5.0 br59 lung_bone · axial · 5.0mm · 0.64mm/px · 1 of 65 slices shown]
[im 1/65]
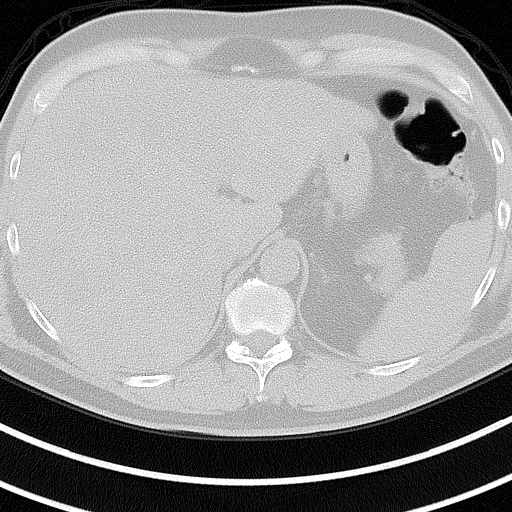

[Series 603: <mip collection> · coronal · 2.02mm/px · 1 of 32 slices shown]
[im 1/32]
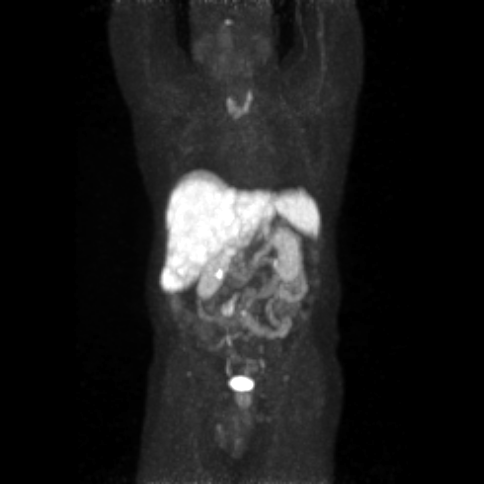

[Series 604: fused cor · 1 of 60 slices shown]
[im 1/60]
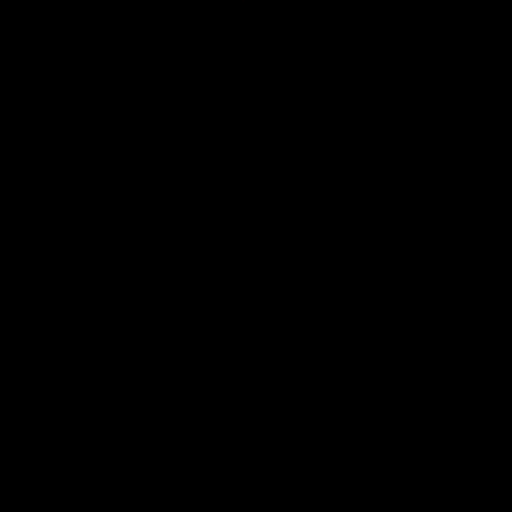

[Series 605: range-ct sk_thigh 5.0 bf37-tra-<alpha range> · 5 of 217 slices shown]
[im 1/217]
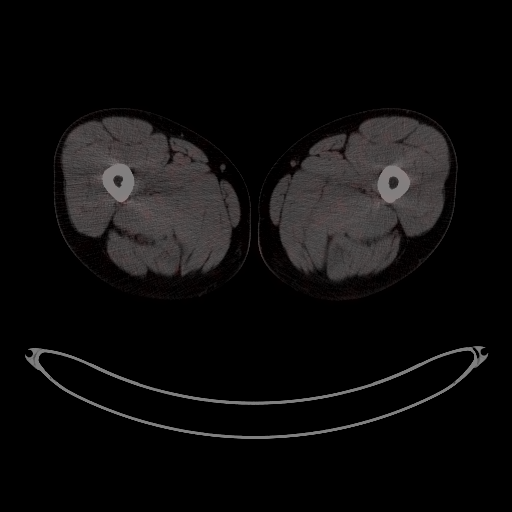
[im 55/217]
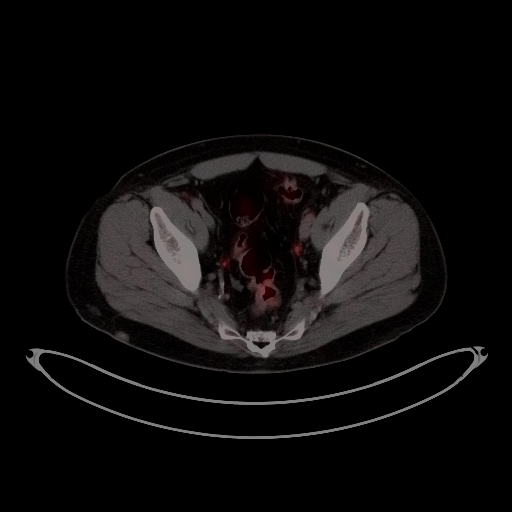
[im 109/217]
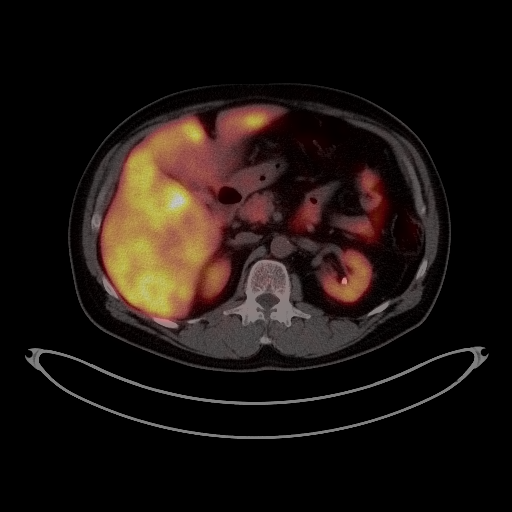
[im 163/217]
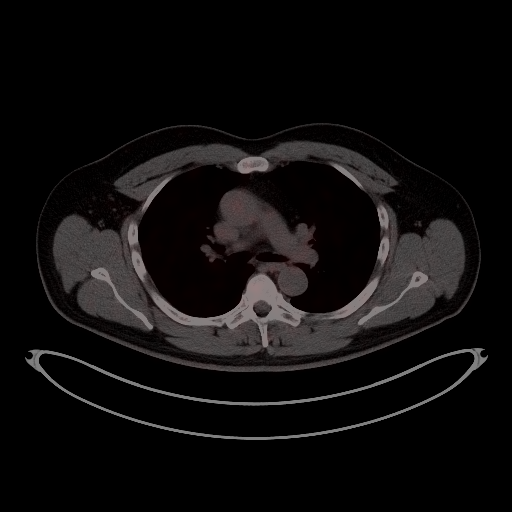
[im 217/217]
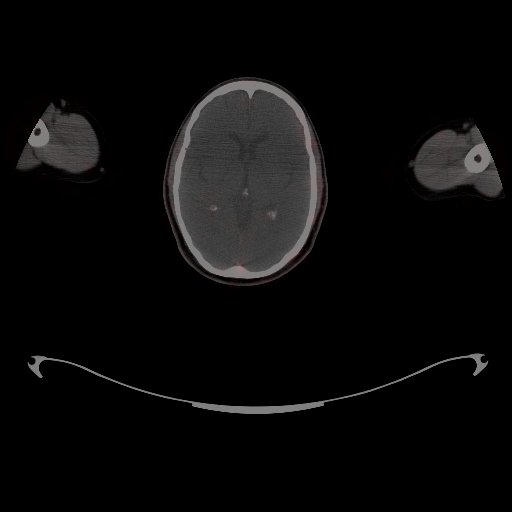

[Series 8293: results mm oncology reading · 1.0mm · 1.19mm/px · 1 of 8 slices shown]
[im 1/8]
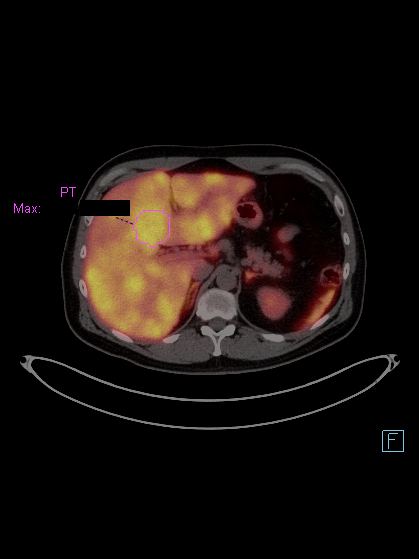

[25 of 25 positions shown; findings below may reference images not displayed]

FINDINGS: NECK

No radiotracer activity in neck lymph nodes.

Incidental CT findings: None

CHEST

No radiotracer accumulation within mediastinal or hilar lymph nodes.
No suspicious pulmonary nodules on the CT scan.

Incidental CT finding:None

ABDOMEN/PELVIS

Again demonstrated widespread hepatic metastasis with intense
radiotracer activity. The lesions reach near confluence in LEFT and
RIGHT hepatic lobe in a similar to comparison exam. Lesion are
similar in size. Example lesion central LEFT hepatic lobe measuring
4.2 cm (image 108/4 )compares to 4.2 cm. Lesion in the LEFT lateral
hepatic lobe measuring 2.5 cm compares to 2.4 cm.

While the activity within individual lesion lesions remains intense,
the activity has decreased moderately compared to NEREJIDA pre therapy
DOTATATE PET scan.

For example lesion in the central LEFT hepatic lobe with SUV max
equal 19 decreased SUV max equal 24. Lesion in subcapsular RIGHT
hepatic lobe with SUV max equal 18 is decreased from SUV max equal
25 (image 127).

Lesion in the lateral segment of the LEFT hepatic lobe with SUV max
equal 19.3 compared SUV max equal 22.3.

Lesion in the central LEFT hepatic lobe measuring 20.4 compares to
the 22.

No new hepatic lesions.

Again demonstrated hypermetabolic central mesenteric metastasis with
central calcification measuring 2.7 cm compared to 2.8 cm with SUV
max equal 11.8 compared to SUV max equal 11.5. Adjacent probable
small bowel lesion is also similar with SUV max equal 8.8 compared
SUV max 9.4. (Both lesions found on image 159).

No evidence of new mesenteric metastasis.  No bowel obstruction.

Physiologic activity noted in the liver, spleen, adrenal glands and
kidneys.

Incidental CT findings:None

SKELETON

No focal activity to suggest skeletal metastasis.

Incidental CT findings:None
IMPRESSION: 1. Overall positive measurable response to therapy. While the
multiple hepatic metastasis continue demonstrate intense radiotracer
activity, the activity is consistently decreased from pre
radiotherapy scan.
2. No evidence of progressive disease in the liver.
3. Central mesenteric metastasis and adjacent small-bowel lesion
have very similar metabolic activity and no change in size.
4. No evidence of new peritoneal metastasis or skeletal metastasis.

## 2020-04-22 MED ORDER — LANREOTIDE ACETATE 120 MG/0.5ML ~~LOC~~ SOLN
120.0000 mg | Freq: Once | SUBCUTANEOUS | Status: DC
  Administered 2020-04-22: 120 mg via SUBCUTANEOUS
  Filled 2020-04-22: qty 120

## 2020-04-22 MED ORDER — FLUDEOXYGLUCOSE F - 18 (FDG) INJECTION: 5.1600 | Freq: Once | INTRAVENOUS | Status: DC | PRN

## 2020-04-22 MED ORDER — GALLIUM GA 68 DOTATATE IV KIT
5.1600 | PACK | Freq: Once | INTRAVENOUS | Status: AC | PRN
  Administered 2020-04-22: 5.16 via INTRAVENOUS

## 2020-04-22 NOTE — Progress Notes (Signed)
[facility] OFFICE PROGRESS NOTE   Diagnosis: Carcinoid tumor  INTERVAL HISTORY:   William Hancock returns as scheduled.  He completed cycle 4 of Lutathera on 03/25/2020.  He developed acute swelling and pain at the left elbow on 01/14/2020.  He he saw orthopedics and reports no specific diagnosis was made.  The pain and swelling resolved after 1 week.  He subsequently had acute pain and erythema at the right ankle lasting for 2 days.  No other joint pain.  No diarrhea.  Good appetite.  He has flushing only when he drinks alcohol.  Objective:  Vital signs in last 24 hours:  Blood pressure 113/72, pulse 61, temperature 97.7 F (36.5 C), temperature source Tympanic, resp. rate 16, height 5\' 11"  (1.803 m), weight 212 lb 4.8 oz (96.3 kg), SpO2 98 %.    Resp: Lungs clear bilaterally Cardio: Regular rate and rhythm GI: The liver is palpable throughout the right upper abdomen, no splenomegaly Vascular: No leg edema Musculoskeletal: No swelling or erythema at the left elbow.  No edema at the right ankle    Lab Results:  Lab Results  Component Value Date   WBC 6.2 04/22/2020   HGB 14.0 04/22/2020   HCT 42.3 04/22/2020   MCV 90.8 04/22/2020   PLT 153 04/22/2020   NEUTROABS 4.6 04/22/2020    CMP  Lab Results  Component Value Date   NA 139 03/25/2020   K 4.0 03/25/2020   CL 113 (H) 03/25/2020   CO2 19 (L) 03/25/2020   GLUCOSE 154 (H) 03/25/2020   BUN 19 03/25/2020   CREATININE 1.37 (H) 03/25/2020   CALCIUM 9.3 03/25/2020   PROT 7.0 03/25/2020   ALBUMIN 4.2 03/25/2020   AST 30 03/25/2020   ALT 31 03/25/2020   ALKPHOS 137 (H) 03/25/2020   BILITOT 0.9 03/25/2020   GFRNONAA >60 03/25/2020   GFRAA >60 02/26/2020    Medications: I have reviewed the patient's current medications.   Assessment/Plan: 1. Metastatic neuroendocrine tumor, well-differentiated  10/31/2018 Abdominal ultrasound-numerous nearly confluent heterogeneous masses measuring up to 3.5 cm.    11/04/2018 CT abdomen/pelvis-multiple enhancing hepatic lesions and a central partially calcified spiculated mesenteric mass measuring 3.1 x 3.1 cm.   11/13/2018 biopsy liver lesion-metastatic well-differentiated neuroendocrine tumor positive for CKAE1-AE3, synaptophysin, chromogranin, CD56 and CDX-2; Ki-67 labeling index less than 3%; TTF-1 negative  Netspot 12/12/2018-increased activity associated a lobular mesenteric mass, small bowel medial to the mesenteric mass, and multiple liver lesions  Elevated chromogranin A level and 24-hour urine 5 HIAA  Monthly lanreotide beginning 12/23/2018  CT 05/13/2019-multiple large hepatic metastases on the dotatate are poorly visualized, several small lesions are decreased in size 1 new lesion, stable mesenteric mass  Netspot 09/04/2019-increase in size of left and right liver lesions, stable to slightly decreased radiotracer accumulation, stable central mesenteric mass, no new metastatic lesions  Lutathera 10/13/2019  Lutathera 12/04/2019  Lutathera 01/29/2020  Lutathera 03/25/2020 2. History of MI/stent placement 3. CAD 4. Hypertension 5. Renal insufficiency 6. Hypothyroidism diagnosed with markedly elevated TSH and low T4 on 12/18/2018 7. Acute left elbow/right ankle pain and swelling August 2021    Disposition: Mr. Anfinson appears stable.  He is scheduled for a restaging Netspot scan today.  He will continue monthly lanreotide.  He will return for an office visit in 2 months.  The etiology of the joint pain/swelling in August is unclear.  This is potentially related to toxicity from the lanreotide, but he had been maintained on lanreotide for greater than  a year when this occurred and only 2 joints were affected.  He will contact us if he develops recurrent joint symptoms.  I discussed the case with Dr. Leonia Reeves.  We will hold lanreotide until after the Netspot scan today.  Betsy Coder, MD  04/22/2020  8:21 AM

## 2020-04-26 ENCOUNTER — Encounter: Payer: Self-pay | Admitting: Oncology

## 2020-04-26 LAB — CHROMOGRANIN A: Chromogranin A (ng/mL): 3183 ng/mL — ABNORMAL HIGH (ref 0.0–101.8)

## 2020-05-01 ENCOUNTER — Encounter: Payer: Self-pay | Admitting: Oncology

## 2020-05-02 ENCOUNTER — Telehealth: Payer: Self-pay | Admitting: *Deleted

## 2020-05-02 NOTE — Telephone Encounter (Signed)
Informed patient that Dr. Benay Spice feels his pain in liver area can be a flare due to recent lutathera treatment. Suggests giving it a few days to see if it subsides. Encouraged him to try Ibuprofen alternating with Tylenol for discomfort.

## 2020-05-06 ENCOUNTER — Other Ambulatory Visit: Payer: Self-pay

## 2020-05-06 ENCOUNTER — Ambulatory Visit (HOSPITAL_COMMUNITY)
Admission: RE | Admit: 2020-05-06 | Discharge: 2020-05-06 | Disposition: A | Discharge status: Home / Self Care | Payer: Federal, State, Local not specified - PPO | Source: Ambulatory Visit | Attending: Oncology | Admitting: Oncology

## 2020-05-06 DIAGNOSIS — Z9889 Other specified postprocedural states: Secondary | ICD-10-CM | POA: Diagnosis not present

## 2020-05-06 DIAGNOSIS — D3A098 Benign carcinoid tumors of other sites: Secondary | ICD-10-CM

## 2020-05-06 DIAGNOSIS — C7A1 Malignant poorly differentiated neuroendocrine tumors: Secondary | ICD-10-CM | POA: Diagnosis not present

## 2020-05-06 DIAGNOSIS — C7B02 Secondary carcinoid tumors of liver: Secondary | ICD-10-CM | POA: Diagnosis not present

## 2020-05-06 IMAGING — NM NM RADIOLOGIST EVAL AND MGMT
1 series · 1 of 1 positions shown · non-contrast
Comparison: none

[Series 1: static · 2.07mm/px · 1 of 1 slices shown]
[im 1/1]
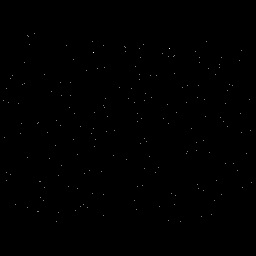

[1 of 1 positions shown; findings below may reference images not displayed]

EXAM:
NEW PATIENT OFFICE VISIT

CHIEF COMPLAINT:
Well differentiated neuroendocrine tumor with liver metastasis.

Current Pain Level: 1-10

HISTORY OF PRESENT ILLNESS:
Patient completed 4 cycles peptide receptor radiotherapy over 6
mnoths mnutes with 200 millicuries Lu 177 Dotatate per treatment.
Last therapy [DATE].

Follow-up DOTATATE PET scan ([DATE]) demonstrated persistent
multifocal hepatic metastasis with measurable decreased somatostatin
receptor activity within the tumor metastasis. Stable central
mesenteric nodal metastasis.

Patient chromogranin A remains elevated in the 3,000s but lower than
pretreatment value.

Objectively patient has no symptoms other than mild diarrhea and
some flushing with alcohol.

Patient has experienced migrating arthralgias around the timing of
the Sandostatin injections

REVIEW OF SYSTEMS:
See [REDACTED] note

PHYSICAL EXAMINATION:
Deferred for COVID.

ASSESSMENT AND PLAN:
1. Patient status post 4 treatments of peptide receptor radiotherapy
(200 mci Lu 177 DOTATATE). Patient tolerated the procedure well with
no evidence of myelosuppression, hepatic toxicity, or renal
toxicity. Subjectively the patient feels well. Patient has some
persistent mild diarrhea which is not improved following treatment.
Patient does report 2 episodes of unexplained migrating arthralgias
potentially related to Sandostatin injections.
2. Objectively patient's follow-up DOTATATE PET scan demonstrated
decreased radiotracer activity within multifocal hepatic metastasis.
Similar activity in central mesenteric metastasis. Chromogranin a
remains elevated at 3,100 decreased from high of 3,600 prior to
treatment.
3. Patient advised ongoing radiotoxicity from treatment can occur
over the next several months.
4. Patient advised to follow-up with any questions or concerns.
Patient's oncologist Dr. KEI continue monitoring with
molecular imaging and therapy department available for consultation

## 2020-05-06 NOTE — Consult Note (Signed)
Chief Complaint: Patient with metastatic neuroendocrine tumor post completion of 4 cycles Peptide receptor radiotherapy (PRRT) with FB510 DOTATATE (Lutathera).  Referring Physician(s):Sherrill    Patient Status: [facility] - Out-pt  History of Present Illness: William Hancock is a 54 y.o. 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  Initial diagnosis in June 2020 upon the discovery of multiple hepatic metastasis.  Patient had experienced bloating and diarrhea which  prompted ultrasound in subsequent CT.  Biopsy of liver lesion demonstrated well differentiated neuroendocrine tumor. Ki-67 labeling index less than 3%;  Subsequent  DOTATATE PET scan of 11/12/2018 demonstrated multiple foci of well differentiated neuroendocrine tumor within the liver as well as a central mesenteric mass and potential small nodule in the small bowel.]  Subsequently patient demonstrates evidence of progression with increase in size of liver metastasis and increasing chromogranin level currently greater than 3,600.   Patient completed 4 cycles peptide receptor radiotherapy over 6 minutes with 258 millicuries Lu 527 Dotatateper treatment.  Last therapy October 2021.  Follow-up DOTATATE PET scan (04/22/2020) demonstrated persistent multifocal hepatic metastasis with measurable decreased somatostatin receptor activity within the tumor metastasis..  Stable central mesenteric nodal metastasis.    Patient chromogranin A remains elevated in the 3Ks but lower than pretreatment value.   Objectively patient has no symptoms other than mild diarrhea and some flushing with alcohol.   Patient has experienced migrating arthralgias around the timing of the Sandostatin injections    Past Medical History:  Diagnosis Date  . Coronary artery disease   . Elevated liver enzymes 06/09/2018  . Hyperlipidemia   . Hypertension   . Myocardial infarction ([place])   . Neuro-endocrine cancer ([place])  11/07/2018    Past Surgical History:  Procedure Laterality Date  . CARDIAC CATHETERIZATION  06/09/2018  . CORONARY ATHERECTOMY N/A 06/09/2018   Procedure: CORONARY ATHERECTOMY;  Surgeon: Nigel Mormon, MD;  Location: [place] [facility];  Service: Cardiovascular;  Laterality: N/A;  . CORONARY STENT INTERVENTION N/A 06/06/2018   Procedure: CORONARY STENT INTERVENTION;  Surgeon: Nigel Mormon, MD;  Location: [place] [facility];  Service: Cardiovascular;  Laterality: N/A;  . CORONARY STENT INTERVENTION  06/09/2018  . CORONARY STENT INTERVENTION N/A 06/09/2018   Procedure: CORONARY STENT INTERVENTION;  Surgeon: Nigel Mormon, MD;  Location: [place] [facility];  Service: Cardiovascular;  Laterality: N/A;  . INTRAVASCULAR ULTRASOUND/IVUS N/A 06/06/2018   Procedure: Intravascular Ultrasound/IVUS;  Surgeon: Nigel Mormon, MD;  Location: [place] [facility];  Service: Cardiovascular;  Laterality: N/A;  . INTRAVASCULAR ULTRASOUND/IVUS N/A 06/09/2018   Procedure: Intravascular Ultrasound/IVUS;  Surgeon: Nigel Mormon, MD;  Location: [place] [facility];  Service: Cardiovascular;  Laterality: N/A;  . KNEE SURGERY Left 2009  . LEFT HEART CATH AND CORONARY ANGIOGRAPHY N/A 06/06/2018   Procedure: LEFT HEART CATH AND CORONARY ANGIOGRAPHY;  Surgeon: Nigel Mormon, MD;  Location: [place] [facility];  Service: Cardiovascular;  Laterality: N/A;  . TONSILLECTOMY    . WISDOM TOOTH EXTRACTION      Allergies: Penicillins  Medications: Prior to Admission medications   Medication Sig Start Date End Date Taking? Authorizing Provider  amLODipine (NORVASC) 5 MG tablet Take 1 tablet (5 mg total) by mouth daily. 01/18/20   Patwardhan, Reynold Bowen, MD  aspirin 81 MG chewable tablet Chew 1 tablet (81 mg total) by mouth daily. 01/18/20   Patwardhan, Reynold Bowen, MD  atorvastatin (LIPITOR) 40 MG tablet Take 1 tablet (40 mg total) by mouth  daily. 01/18/20   Patwardhan, Reynold Bowen, MD  levothyroxine  (SYNTHROID) 125 MCG tablet Take 125 mcg by mouth daily. 12/27/18   [provider]  metoprolol succinate (TOPROL-XL) 25 MG 24 hr tablet Take 1 tablet (25 mg total) by mouth in the morning and at bedtime. 01/18/20   Patwardhan, Reynold Bowen, MD  nitroGLYCERIN (NITROSTAT) 0.4 MG SL tablet Place 1 tablet (0.4 mg total) under the tongue every 5 (five) minutes x 3 doses as needed for chest pain. Patient not taking: Reported on 04/22/2020 01/18/20   Patwardhan, Reynold Bowen, MD  ondansetron (ZOFRAN) 8 MG tablet Take 1 tablet (8 mg total) by mouth 2 (two) times daily as needed for nausea or vomiting. Patient not taking: Reported on 04/22/2020 03/25/20   Gus Height, MD  ticagrelor (BRILINTA) 60 MG TABS tablet Take 1 tablet (60 mg total) by mouth 2 (two) times daily. 01/18/20   Patwardhan, Reynold Bowen, MD  vitamin B-12 (CYANOCOBALAMIN) 500 MCG tablet Take 1,000 mcg by mouth daily.    [provider]     Family History  Problem Relation Age of Onset  . Diabetes Father   . Kidney failure Father   . Coronary artery disease Paternal Grandfather        Died in 69s  . Breast cancer Mother   . Breast cancer Sister   . Kidney disease Sister     Social History   Socioeconomic History  . Marital status: Married    Spouse name: Not on file  . Number of children: 2  . Years of education: Not on file  . Highest education level: Not on file  Occupational History  . Occupation: Curator  Tobacco Use  . Smoking status: Former Smoker    Quit date: 06/05/1998    Years since quitting: 21.9  . Smokeless tobacco: Never Used  Vaping Use  . Vaping Use: Never used  Substance and Sexual Activity  . Alcohol use: Yes    Comment: 2 drinks/month  . Drug use: Never  . Sexual activity: Not on file  Other Topics Concern  . Not on file  Social History Narrative  . Not on file   Social Determinants of Health   Financial Resource Strain:   . Difficulty of Paying Living Expenses: Not on file    Food Insecurity:   . Worried About Charity fundraiser in the Last Year: Not on file  . Ran Out of Food in the Last Year: Not on file  Transportation Needs:   . Lack of Transportation (Medical): Not on file  . Lack of Transportation (Non-Medical): Not on file  Physical Activity:   . Days of Exercise per Week: Not on file  . Minutes of Exercise per Session: Not on file  Stress:   . Feeling of Stress : Not on file  Social Connections:   . Frequency of Communication with Friends and Family: Not on file  . Frequency of Social Gatherings with Friends and Family: Not on file  . Attends Religious Services: Not on file  . Active Member of Clubs or Organizations: Not on file  . Attends Archivist Meetings: Not on file  . Marital Status: Not on file    ECOG Status: 1 - Symptomatic but completely ambulatory  Review of Systems: A 12 point ROS discussed and pertinent positives are indicated in the HPI above.  All other systems are negative.  Review of Systems  Gastrointestinal: Positive for diarrhea (mIld diarrhea  -  not improved following therapy).  Musculoskeletal: Positive for arthralgias (migrating arthralgias).  Skin: Positive for color change.       Mild flushing with Etoh.    Vital Signs: There were no vitals taken for this visit.  Physical Exam Constitutional:      Appearance: Normal appearance.  Neurological:     Mental Status: He is alert and oriented to person, place, and time.  Psychiatric:        Mood and Affect: Mood normal.     Imaging: NM PET (NETSPOT GA 47 DOTATATE) SKULL BASE TO MID THIGH  Result Date: 04/22/2020 CLINICAL DATA:  Well differentiated neuroendocrine tumor with liver metastasis. Patient status post 4 cycles peptide receptor radiotherapy (Lu 177 DOTATATE) with last therapy on 03/25/2020. EXAM: NUCLEAR MEDICINE PET SKULL BASE TO THIGH TECHNIQUE: 5.2 mCi gallium 68 DOTATATE was injected intravenously. Full-ring PET imaging was performed from  the skull base to thigh after the radiotracer. CT data was obtained and used for attenuation correction and anatomic localization. COMPARISON:  DOTATATE PET scan 09/04/2019, 12/13/2018 FINDINGS: NECK No radiotracer activity in neck lymph nodes. Incidental CT findings: None CHEST No radiotracer accumulation within mediastinal or hilar lymph nodes. No suspicious pulmonary nodules on the CT scan. Incidental CT finding:None ABDOMEN/PELVIS Again demonstrated widespread hepatic metastasis with intense radiotracer activity. The lesions reach near confluence in LEFT and RIGHT hepatic lobe in a similar to comparison exam. Lesion are similar in size. Example lesion central LEFT hepatic lobe measuring 4.2 cm (image 108/4 )compares to 4.2 cm. Lesion in the LEFT lateral hepatic lobe measuring 2.5 cm compares to 2.4 cm. While the activity within individual lesion lesions remains intense, the activity has decreased moderately compared to teh pre therapy DOTATATE PET scan. For example lesion in the central LEFT hepatic lobe with SUV max equal 19 decreased SUV max equal 24. Lesion in subcapsular RIGHT hepatic lobe with SUV max equal 18 is decreased from SUV max equal 25 (image 127). Lesion in the lateral segment of the LEFT hepatic lobe with SUV max equal 19.3 compared SUV max equal 22.3. Lesion in the central LEFT hepatic lobe measuring 20.4 compares to the 22. No new hepatic lesions. Again demonstrated hypermetabolic central mesenteric metastasis with central calcification measuring 2.7 cm compared to 2.8 cm with SUV max equal 11.8 compared to SUV max equal 11.5. Adjacent probable small bowel lesion is also similar with SUV max equal 8.8 compared SUV max 9.4. (Both lesions found on image 159). No evidence of new mesenteric metastasis.  No bowel obstruction. Physiologic activity noted in the liver, spleen, adrenal glands and kidneys. Incidental CT findings:None SKELETON No focal activity to suggest skeletal metastasis. Incidental  CT findings:None IMPRESSION: 1. Overall positive measurable response to therapy. While the multiple hepatic metastasis continue demonstrate intense radiotracer activity, the activity is consistently decreased from pre radiotherapy scan. 2. No evidence of progressive disease in the liver. 3. Central mesenteric metastasis and adjacent small-bowel lesion have very similar metabolic activity and no change in size. 4. No evidence of new peritoneal metastasis or skeletal metastasis. Electronically Signed   By: Suzy Bouchard M.D.   On: 04/22/2020 13:36   NM PET (NETSPOT GA 41 DOTATATE) SKULL BASE TO MID THIGH  Result Date: 09/07/2019 CLINICAL DATA:  Metastatic well differentiated neuroendocrine tumor. Liver metastasis. Monthly Sandostatin injections ongoing EXAM: NUCLEAR MEDICINE PET SKULL BASE TO THIGH TECHNIQUE: 5.3 mCi Ga 68 DOTATATE was injected intravenously. Full-ring PET imaging was performed from the skull base to thigh after the radiotracer.  CT data was obtained and used for attenuation correction and anatomic localization. COMPARISON:  CT 05/13/2019, DOTATATE PET-CT scan 12/12/2018 with FINDINGS: NECK No radiotracer activity in neck lymph nodes. Diffuse uptake in the thyroid gland is again noted in favored benign. Incidental CT findings: None CHEST No radiotracer accumulation within mediastinal or hilar lymph nodes. No suspicious pulmonary nodules on the CT scan. Mild activity in RIGHT hilar axillary lymph nodes related to COVID vaccine injection. Incidental CT finding:None ABDOMEN/PELVIS Multiple lesions again demonstrated within liver which have intense radiotracer activity. Lesions are near confluent in the LEFT and RIGHT hepatic lobe. The degree of confluency/size of lesions is overall increased from comparison exam. For example lesion in the central RIGHT hepatic lobe measuring 5.4 cm (image 106/4) compares to 4.3 cm on PET-CT 12/12/2018. Lesion in the more inferior RIGHT hepatic lobe (image 118/4)  measures 4.1 cm compared to 3.6 cm. Lesion in the posterior RIGHT hepatic lobe measures 3.9 cm (image 124/4) compared to 2.4 cm. Lesions have similar to slightly decreased radiotracer activity. For example lesion inferior RIGHT hepatic lobe SUV max equal 24.5 comparison 25.1 (image 138). Large lesion in the central RIGHT hepatic lobe with SUV max equal 24.2 comparison SUV max equal 30 (image 111). Lesion LEFT hepatic lobe SUV max equal 21.7 compared to SUV max equal 30.7 Within the central mesentery there is a partially calcified mass measuring 3.1 x 2.9 cm compared with 3.2 x 2.8 cm. Lesion has intense radiotracer activity with SUV max equal 11.5 compared to 18.3. Adjacent to the central mesenteric mass there is a focus of radiotracer activity which appears to be associated with the small bowel with SUV max equal 9.3 compared SUV max equal 14.8. This lesion is difficult define on the CT portion of exam. No new lesions are present in the abdomen pelvis. Physiologic activity noted in the liver, spleen, adrenal glands and kidneys. Incidental CT findings:No bowel obstruction. Nonobstructing LEFT renal calculus. SKELETON No focal activity to suggest skeletal metastasis. Incidental CT findings:None IMPRESSION: 1. Interval increase in size of hepatic lesions in the LEFT and RIGHT hepatic lobe which reach near confluence. Overall radiotracer activity is similar to slightly decreased from comparison exam. 2. Stable size and radiotracer activity of central mesenteric mass. 3. Lesion presumably localizing to the small bowel adjacent to the mesenteric mass is mildly decreased in activity. No clear lesion identified on CT portion exam. 4. No evidence of new metastatic lesions. Electronically Signed   By: Suzy Bouchard M.D.   On: 09/07/2019 09:30    Labs:  CBC: Recent Labs    01/29/20 0827 02/26/20 0909 03/25/20 0947 04/22/20 0744  WBC 7.7 7.3 4.8 6.2  HGB 14.4 14.1 13.0 14.0  HCT 42.5 42.1 37.6* 42.3  PLT 212  175 165 153    COAGS: No results for input(s): INR, APTT in the last 8760 hours.  BMP: Recent Labs    12/04/19 0849 12/04/19 0849 01/01/20 0859 01/01/20 0859 01/29/20 0827 02/26/20 0909 03/25/20 0835 04/22/20 0744  NA 139   < > 143   < > 139 142 139 143  K 4.1   < > 4.6   < > 4.4 4.2 4.0 4.6  CL 108   < > 111   < > 106 107 113* 108  CO2 24   < > 23   < > 23 27 19* 25  GLUCOSE 136*   < > 99   < > 122* 104* 154* 148*  BUN 20   < >  18   < > _0 CALCIUM 9.2   < > 10.5*   < > 9.4 9.8 9.3 9.9  CREATININE 1.22   < > 1.60*  --  1.35* 1.31* 1.37* 1.54*  GFRNONAA >60   < > 47*  --  58* >60 >60 53*  GFRAA >60  --  55*  --  >60 >60  --   --    < > = values in this interval not displayed.    LIVER FUNCTION TESTS: Recent Labs    01/29/20 0827 02/26/20 0909 03/25/20 0835 04/22/20 0744  BILITOT 0.8 0.7 0.9 0.9  AST _1 ALT _2 ALKPHOS 146* 173* 137* 153*  PROT 7.2 7.2 7.0 7.0  ALBUMIN 3.7 3.6 4.2 3.8    TUMOR MARKERS:  2 wk ago 2 mo ago 7 mo ago 1 yr ago     3,183.0High  2,459.0High CM  3,641.0High CM  1,884.0High CM     Assessment and Plan:  1. Patient status post 4 treatments of peptide receptor radiotherapy (200 mci Lu 177 DOTATATE).  Patient tolerated the procedure well with no evidence of myelosuppression, hepatic toxicity, or renal toxicity.  Subjectively the patient feels well.  Patient has some persistent mild diarrhea which is not improved following treatment.  Patient does report 2 episodes of unexplained migrating arthralgias potentially related to Sandostatin injections.  2. Objectively patient's follow-up DOTATATE PET scan demonstrated decreased radiotracer activity within multifocal hepatic metastasis.  Similar activity in central mesenteric metastasis.  Chromogranin A remains elevated at 3,100 decreased from high of 3,600 prior to treatment.  3. Patient advised ongoing tumor radiotoxicity can occur over the next several  months.  4. Patient advised to follow-up with any questions or concerns.  Patient's oncologist Dr. Benay Spice will continue monitoring with molecular imagingand therapy department available for consultation    Thank you for this interesting consult.  I greatly enjoyed meeting Gabryel Files and look forward to participating in their care.  A copy of this report was sent to the requesting provider on this date.  Electronically Signed: Rennis Golden, MD 05/06/2020, 2:53 PM   I spent a total of    15 Minutes in face to face in clinical consultation, greater than 50% of which was counseling/coordinating care for metastatic neuroendocrine tumor.

## 2020-05-20 ENCOUNTER — Other Ambulatory Visit: Payer: Self-pay

## 2020-05-20 ENCOUNTER — Inpatient Hospital Stay: Payer: Federal, State, Local not specified - PPO | Attending: Nurse Practitioner

## 2020-05-20 VITALS — BP 132/87 | HR 62 | Temp 98.0°F | Resp 16

## 2020-05-20 DIAGNOSIS — C7B02 Secondary carcinoid tumors of liver: Secondary | ICD-10-CM | POA: Insufficient documentation

## 2020-05-20 DIAGNOSIS — D3A098 Benign carcinoid tumors of other sites: Secondary | ICD-10-CM

## 2020-05-20 DIAGNOSIS — C7A098 Malignant carcinoid tumors of other sites: Secondary | ICD-10-CM | POA: Insufficient documentation

## 2020-05-20 MED ORDER — LANREOTIDE ACETATE 120 MG/0.5ML ~~LOC~~ SOLN
120.0000 mg | Freq: Once | SUBCUTANEOUS | Status: AC
  Administered 2020-05-20: 120 mg via SUBCUTANEOUS
  Filled 2020-05-20: qty 120

## 2020-05-23 ENCOUNTER — Ambulatory Visit: Payer: Federal, State, Local not specified - PPO

## 2020-06-14 ENCOUNTER — Other Ambulatory Visit: Payer: Self-pay | Admitting: Cardiology

## 2020-06-17 ENCOUNTER — Telehealth: Payer: Self-pay | Admitting: Nurse Practitioner

## 2020-06-17 NOTE — Telephone Encounter (Signed)
Scheduled appt per 1/14 sch msg - pt is aware of new appt date and time

## 2020-06-18 ENCOUNTER — Other Ambulatory Visit: Payer: Federal, State, Local not specified - PPO

## 2020-06-18 DIAGNOSIS — Z1152 Encounter for screening for COVID-19: Secondary | ICD-10-CM | POA: Diagnosis not present

## 2020-06-20 ENCOUNTER — Ambulatory Visit: Payer: Federal, State, Local not specified - PPO

## 2020-06-20 ENCOUNTER — Ambulatory Visit: Payer: Federal, State, Local not specified - PPO | Admitting: Nurse Practitioner

## 2020-06-21 ENCOUNTER — Telehealth: Payer: Self-pay

## 2020-06-21 NOTE — Telephone Encounter (Signed)
Incoming call received pt had covid test done sat at [facility] results are not yet returned pt reports mild sx dry cough some fever pt appts for 06/22/20 are cancelled until results received

## 2020-06-22 ENCOUNTER — Inpatient Hospital Stay: Payer: Federal, State, Local not specified - PPO | Admitting: Nurse Practitioner

## 2020-06-22 ENCOUNTER — Inpatient Hospital Stay: Payer: Federal, State, Local not specified - PPO

## 2020-06-22 ENCOUNTER — Telehealth: Payer: Self-pay

## 2020-06-22 ENCOUNTER — Telehealth: Payer: Self-pay | Admitting: Nurse Practitioner

## 2020-06-22 NOTE — Telephone Encounter (Signed)
Rescheduled injection appointment per 1/19 schedule message due to positive COVID test. Patient is aware of changes.

## 2020-06-22 NOTE — Telephone Encounter (Signed)
Received call that pt covid test returned positive scheduling message sent pt to be scheduled 21 days out

## 2020-07-15 ENCOUNTER — Inpatient Hospital Stay: Payer: Federal, State, Local not specified - PPO | Attending: Nurse Practitioner | Admitting: Nurse Practitioner

## 2020-07-15 ENCOUNTER — Encounter: Payer: Self-pay | Admitting: Nurse Practitioner

## 2020-07-15 ENCOUNTER — Other Ambulatory Visit: Payer: Self-pay

## 2020-07-15 ENCOUNTER — Inpatient Hospital Stay: Payer: Federal, State, Local not specified - PPO

## 2020-07-15 VITALS — BP 135/88 | HR 65 | Temp 95.9°F | Resp 16 | Ht 71.0 in | Wt 219.8 lb

## 2020-07-15 DIAGNOSIS — I252 Old myocardial infarction: Secondary | ICD-10-CM | POA: Diagnosis not present

## 2020-07-15 DIAGNOSIS — C7B04 Secondary carcinoid tumors of peritoneum: Secondary | ICD-10-CM | POA: Diagnosis not present

## 2020-07-15 DIAGNOSIS — I10 Essential (primary) hypertension: Secondary | ICD-10-CM | POA: Diagnosis not present

## 2020-07-15 DIAGNOSIS — C7A098 Malignant carcinoid tumors of other sites: Secondary | ICD-10-CM | POA: Diagnosis not present

## 2020-07-15 DIAGNOSIS — N289 Disorder of kidney and ureter, unspecified: Secondary | ICD-10-CM | POA: Insufficient documentation

## 2020-07-15 DIAGNOSIS — C7B02 Secondary carcinoid tumors of liver: Secondary | ICD-10-CM

## 2020-07-15 DIAGNOSIS — I2510 Atherosclerotic heart disease of native coronary artery without angina pectoris: Secondary | ICD-10-CM | POA: Insufficient documentation

## 2020-07-15 DIAGNOSIS — D3A098 Benign carcinoid tumors of other sites: Secondary | ICD-10-CM

## 2020-07-15 DIAGNOSIS — E039 Hypothyroidism, unspecified: Secondary | ICD-10-CM | POA: Diagnosis not present

## 2020-07-15 DIAGNOSIS — C7B01 Secondary carcinoid tumors of distant lymph nodes: Secondary | ICD-10-CM | POA: Insufficient documentation

## 2020-07-15 DIAGNOSIS — R232 Flushing: Secondary | ICD-10-CM | POA: Insufficient documentation

## 2020-07-15 DIAGNOSIS — R197 Diarrhea, unspecified: Secondary | ICD-10-CM | POA: Diagnosis not present

## 2020-07-15 MED ORDER — LANREOTIDE ACETATE 120 MG/0.5ML ~~LOC~~ SOLN
120.0000 mg | Freq: Once | SUBCUTANEOUS | Status: AC
  Administered 2020-07-15: 120 mg via SUBCUTANEOUS
  Filled 2020-07-15: qty 120

## 2020-07-15 NOTE — Patient Instructions (Signed)
Lanreotide injection What is this medicine? LANREOTIDE (lan REE oh tide) is used to reduce blood levels of growth hormone in patients with a condition called acromegaly. It also works to slow or stop tumor growth in patients with neuroendocrine tumors and treat carcinoid syndrome. This medicine may be used for other purposes; ask your health care provider or pharmacist if you have questions. COMMON BRAND NAME(S): Somatuline [place] What should I tell my health care provider before I take this medicine? They need to know if you have any of these conditions:  diabetes  gallbladder disease  heart disease  kidney disease  liver disease  thyroid disease  an unusual or allergic reaction to lanreotide, other medicines, foods, dyes, or preservatives  pregnant or trying to get pregnant  breast-feeding How should I use this medicine? This medicine is for injection under the skin. It is given by a health care professional in a hospital or clinic setting. Contact your pediatrician or health care professional regarding the use of this medicine in children. Special care may be needed. Overdosage: If you think you have taken too much of this medicine contact a poison control center or emergency room at once. NOTE: This medicine is only for you. Do not share this medicine with others. What if I miss a dose? It is important not to miss your dose. Call your doctor or health care professional if you are unable to keep an appointment. What may interact with this medicine? This medicine may interact with the following medications:  bromocriptine  cyclosporine  certain medicines for blood pressure, heart disease, irregular heart beat  certain medicines for diabetes  quinidine  terfenadine This list may not describe all possible interactions. Give your health care provider a list of all the medicines, herbs, non-prescription drugs, or dietary supplements you use. Also tell them if you smoke,  drink alcohol, or use illegal drugs. Some items may interact with your medicine. What should I watch for while using this medicine? Tell your doctor or healthcare professional if your symptoms do not start to get better or if they get worse. Visit your doctor or health care professional for regular checks on your progress. Your condition will be monitored carefully while you are receiving this medicine. This medicine may increase blood sugar. Ask your healthcare provider if changes in diet or medicines are needed if you have diabetes. You may need blood work done while you are taking this medicine. Women should inform their doctor if they wish to become pregnant or think they might be pregnant. There is a potential for serious side effects to an unborn child. Talk to your health care professional or pharmacist for more information. Do not breast-feed an infant while taking this medicine or for 6 months after stopping it. This medicine has caused ovarian failure in some women. This medicine may interfere with the ability to have a child. Talk with your doctor or health care professional if you are concerned about your fertility. What side effects may I notice from receiving this medicine? Side effects that you should report to your doctor or health care professional as soon as possible:  allergic reactions like skin rash, itching or hives, swelling of the face, lips, or tongue  increased blood pressure  severe stomach pain  signs and symptoms of hgh blood sugar such as being more thirsty or hungry or having to urinate more than normal. You may also feel very tired or have blurry vision.  signs and symptoms of low blood   sugar such as feeling anxious; confusion; dizziness; increased hunger; unusually weak or tired; sweating; shakiness; cold; irritable; headache; blurred vision; fast heartbeat; loss of consciousness  unusually slow heartbeat Side effects that usually do not require medical  attention (report to your doctor or health care professional if they continue or are bothersome):  constipation  diarrhea  dizziness  headache  muscle pain  muscle spasms  nausea  pain, redness, or irritation at site where injected This list may not describe all possible side effects. Call your doctor for medical advice about side effects. You may report side effects to FDA at 1-800-FDA-1088. Where should I keep my medicine? This drug is given in a hospital or clinic and will not be stored at home. NOTE: This sheet is a summary. It may not cover all possible information. If you have questions about this medicine, talk to your doctor, pharmacist, or health care provider.  2021 Elsevier/Gold Standard (2018-02-27 09:13:08)  

## 2020-07-15 NOTE — Progress Notes (Signed)
  [facility] OFFICE PROGRESS NOTE   Diagnosis: Carcinoid tumor  INTERVAL HISTORY:   William Hancock returns for follow-up.  He feels well.  No fever, cough, shortness of breath.  He has occasional diarrhea.  He notes flushing with alcohol.  No arthralgias.  Objective:  Vital signs in last 24 hours:  Blood pressure 135/88, pulse 65, temperature (!) 95.9 F (35.5 C), temperature source Tympanic, resp. rate 16, height $RemoveBe'5\' 11"'zKGaJDQqw$  (1.803 m), weight 219 lb 12.8 oz (99.7 kg), SpO2 99 %.    HEENT: No thrush or ulcers. Resp: Lungs clear bilaterally. Cardio: Regular rate and rhythm. GI: Abdomen soft and nontender.  No hepatomegaly. Vascular: No leg edema.   Lab Results:  Lab Results  Component Value Date   WBC 6.2 04/22/2020   HGB 14.0 04/22/2020   HCT 42.3 04/22/2020   MCV 90.8 04/22/2020   PLT 153 04/22/2020   NEUTROABS 4.6 04/22/2020    Imaging:  No results found.  Medications: I have reviewed the patient's current medications.  Assessment/Plan: 1. Metastatic neuroendocrine tumor, well-differentiated  10/31/2018 Abdominal ultrasound-numerous nearly confluent heterogeneous masses measuring up to 3.5 cm.   11/04/2018 CT abdomen/pelvis-multiple enhancing hepatic lesions and a central partially calcified spiculated mesenteric mass measuring 3.1 x 3.1 cm.   11/13/2018 biopsy liver lesion-metastatic well-differentiated neuroendocrine tumor positive for CKAE1-AE3, synaptophysin, chromogranin, CD56 and CDX-2; Ki-67 labeling index less than 3%; TTF-1 negative  Netspot 12/12/2018-increased activity associated a lobular mesenteric mass, small bowel medial to the mesenteric mass, and multiple liver lesions  Elevated chromogranin A level and 24-hour urine 5 HIAA  Monthly lanreotide beginning 12/23/2018  CT 05/13/2019-multiple large hepatic metastases on the dotatate are poorly visualized, several small lesions are decreased in size 1 new lesion, stable mesenteric  mass  Netspot 09/04/2019-increase in size of left and right liver lesions, stable to slightly decreased radiotracer accumulation, stable central mesenteric mass, no new metastatic lesions  Lutathera 10/13/2019  Lutathera 12/04/2019  Lutathera 01/29/2020  Lutathera 03/25/2020  Restaging Netspot 04/22/2020-overall positive measurable response to therapy.  Multiple hepatic metastasis continue to demonstrate intense radiotracer activity, the activity is consistently decreased from preradiotherapy scan.  No evidence of progressive disease in the liver.  Central mesenteric metastasis and adjacent small bowel lesion have very similar metabolic activity and no change in size.  No evidence of new peritoneal metastasis or skeletal metastasis. 2. History of MI/stent placement 3. CAD 4. Hypertension 5. Renal insufficiency 6. Hypothyroidism diagnosed with markedly elevated TSH and low T4 on 12/18/2018 7. Acute left elbow/right ankle pain and swelling August 2021    Disposition: William Hancock appears stable.  We reviewed the restaging Netspot from 04/22/2020 which showed a positive response.  He understands the rationale for continuing monthly lanreotide, injection today.  We will check a chromogranin A level with the next injection.  He will return for follow-up in 2 months.  He will contact the office in the interim with any problems.  Patient seen with Dr. Benay Spice.    Douglas Smolinsky ANP/GNP-BC   07/15/2020  9:55 AM This was a shared visit with Ned Card.  William Hancock appears stable.  He has not developed recurrent arthralgias with lanreotide therapy.  We discussed the treatment plan with him today.  I was present for today's visit and performed medical decision making.  Julieanne Manson, MD

## 2020-07-15 NOTE — Progress Notes (Signed)
Subjective:   William Hancock, male    DOB: [birth year], 54 y.o.   MRN: [id number]   Chief complaint:  Coronary artery disease   HPI  55 year old Caucasian male with hypertension, hyperlipidemia, coronary artery disease, s/p multivessel complex PCI (pro & mid RCA, prox-mid LAD/Diag bifurcation) for NSTEMI in 06/2018, metastatic neuroendocrine tumor  He is now retired from his services as a cop. He is now working out regularly in [employer], walks 7-10 miles/week and also does weight training regularly. He denies chest pain, shortness of breath, palpitations, leg edema, orthopnea, PND, TIA/syncope.   Blood pressure elevated today, but usually always normal .  Current Outpatient Medications on File Prior to Visit  Medication Sig Dispense Refill  . amLODipine (NORVASC) 5 MG tablet Take 1 tablet (5 mg total) by mouth daily. 90 tablet 3  . aspirin 81 MG chewable tablet Chew 1 tablet (81 mg total) by mouth daily. 90 tablet 3  . atorvastatin (LIPITOR) 40 MG tablet TAKE ONE TABLET BY MOUTH DAILY 90 tablet 3  . levothyroxine (SYNTHROID) 125 MCG tablet Take 125 mcg by mouth daily.    . metoprolol succinate (TOPROL-XL) 25 MG 24 hr tablet Take 1 tablet (25 mg total) by mouth in the morning and at bedtime. 180 tablet 1  . nitroGLYCERIN (NITROSTAT) 0.4 MG SL tablet Place 1 tablet (0.4 mg total) under the tongue every 5 (five) minutes x 3 doses as needed for chest pain. 25 tablet 1  . ticagrelor (BRILINTA) 60 MG TABS tablet Take 1 tablet (60 mg total) by mouth 2 (two) times daily. 180 tablet 3  . vitamin B-12 (CYANOCOBALAMIN) 500 MCG tablet Take 1,000 mcg by mouth daily.     No current facility-administered medications on file prior to visit.    Cardiovascular studies:  EKG 07/18/2020: Sinus rhythm 58 bpm First degree A-V block   Coronary intervention 06/09/2018: LM: Normal  LAD: Prox to mid LAD/Diag1 bifurcation severe calcific 80% stenosis        Diag 1 mid 75% stenosis        Atherectomy and  prox LAD into Diag         Synergy DES 2.75 X 16 mm DES        Overlapping Stent into prox LAD Synergy DES 3.0 X 38 mm        Provisional stent mid LAD with minicrush technique Synergy DES 2.75 X 16 mm DES Ramus: 50 % proximal disease LCx: Mild luminal irregularities RCA: Stenting performed 06/06/2018        Severe mid 99% stenosis--->Orsero 3.5 X 26 mm Orsero DES         Post dilatation with 4.0 mm [place] balloon        Severe prox 80% stenosis--->Orsero 3.5 X 9 mm DES        Post dilatation with 4.0 mm [place] balloon   Recommendation: DAPT with Aspirin and brilinta for 1 year Aggressive risk factor modification  Hospital echocardiogram 06/06/2018: - Left ventricle: The cavity size was normal. There was mild   concentric hypertrophy. Systolic function was normal. The   estimated ejection fraction was in the range of 55% to 60%. Wall   motion was normal; there were no regional wall motion   abnormalities. Doppler parameters are consistent with abnormal   left ventricular relaxation (grade 1 diastolic dysfunction). - No significant valvular abnormality.  Recent labs: 04/22/2020: Glucose 148, BUN/Cr 18/1.54. EGFR 53. Na/K 143/4.6. Rest of the CMP normal H/H 14/42. MCV 90.  Platelets 153  01/01/2020: Glucose 99, BUN/Cr 18/1.6. EGFR 47. Na/K 143/4.6.  Chol 82, TG 72, HDL 37, LDL 29 Lipoprotein a 9 normal  06/09/2019: Glucose 86, BUN/Cr 19/1.41. EGFR 56. Na/K 141/4.8. Rest of the CMP normal H/H 14/43. MCV 92. Platelets 154 TSH 2.6 normal  12/18/2018: Chol 104, TG 63, HDL 49, LDL 42  Review of Systems  Review of Systems  Cardiovascular: Negative for chest pain, dyspnea on exertion, leg swelling, palpitations and syncope.         Vitals:   07/18/20 0831  BP: (!) 145/90  Pulse: 60  Resp: 16  Temp: 98.2 F (36.8 C)  SpO2: 95%     Objective:   Physical Exam  Physical Exam Vitals and nursing note reviewed.  Constitutional:      Appearance: He is well-developed.  Neck:      Vascular: No JVD.  Cardiovascular:     Rate and Rhythm: Normal rate and regular rhythm.     Pulses: Intact distal pulses.     Heart sounds: Normal heart sounds. No murmur heard.   Pulmonary:     Effort: Pulmonary effort is normal.     Breath sounds: Normal breath sounds. No wheezing or rales.            Assessment & Recommendations:   55 year old Caucasian male with hypertension, hyperlipidemia, coronary artery disease, s/p multivessel complex PCI (pro & mid RCA, prox-mid LAD/Diag bifurcation) for NSTEMI in 06/2018, metastatic neuroendocrine tumor  CAD without angina: S/p multilvessel PCI for NSTEMI 06/2018 Doing well without angina symptoms. EKG today is unremarkable. In absence of bleeding, and history of complex stenting, recommended extended duration of dual antiplatelet therapy with Aspirin 81 mg daily, and Brilinta 60 mg bid till 06/2021. Brilinta can be interrupted at any time for any future biopsies/procedures, or any bleeding issues.  Continue metoprolol succinate, which he prefers taking 25 mg twice daily.   Continue amlodipine 5 mg daily, lipitor 40 mg daily. Continue lipitor 40 mg daily. LDL 29.  Hypertension: BP elevated today. He states that it is normal at home. I have asked him to keep a log of his blood pressure readings and contact me in a month with the report. If BP stays >140/90 mmHg, will then increase amlodipine to 10 mg daily. No change made today.  Metastatic neuroendocrine tumor: Managed by Dr. Benay Spice.   F/u in 1 year  Nigel Mormon, MD [facility] Pager: [phone] Office: [phone] If no answer Cell [phone]

## 2020-07-18 ENCOUNTER — Ambulatory Visit: Payer: Federal, State, Local not specified - PPO | Admitting: Cardiology

## 2020-07-18 ENCOUNTER — Other Ambulatory Visit: Payer: Self-pay

## 2020-07-18 ENCOUNTER — Encounter: Payer: Self-pay | Admitting: Cardiology

## 2020-07-18 ENCOUNTER — Telehealth: Payer: Self-pay | Admitting: Nurse Practitioner

## 2020-07-18 VITALS — BP 145/90 | HR 60 | Temp 98.2°F | Resp 16 | Ht 71.0 in | Wt 219.0 lb

## 2020-07-18 DIAGNOSIS — C7B8 Other secondary neuroendocrine tumors: Secondary | ICD-10-CM | POA: Diagnosis not present

## 2020-07-18 DIAGNOSIS — I10 Essential (primary) hypertension: Secondary | ICD-10-CM

## 2020-07-18 DIAGNOSIS — I2510 Atherosclerotic heart disease of native coronary artery without angina pectoris: Secondary | ICD-10-CM | POA: Diagnosis not present

## 2020-07-18 MED ORDER — ASPIRIN 81 MG PO CHEW: 81.0000 mg | CHEWABLE_TABLET | Freq: Every day | ORAL | 3 refills | Status: DC

## 2020-07-18 MED ORDER — ATORVASTATIN CALCIUM 40 MG PO TABS: 40.0000 mg | ORAL_TABLET | Freq: Every day | ORAL | 3 refills | Status: DC

## 2020-07-18 MED ORDER — TICAGRELOR 60 MG PO TABS: 60.0000 mg | ORAL_TABLET | Freq: Two times a day (BID) | ORAL | 3 refills | Status: DC

## 2020-07-18 MED ORDER — AMLODIPINE BESYLATE 5 MG PO TABS: 5.0000 mg | ORAL_TABLET | Freq: Every day | ORAL | 3 refills | Status: DC

## 2020-07-18 MED ORDER — METOPROLOL SUCCINATE ER 25 MG PO TB24: 25.0000 mg | ORAL_TABLET | Freq: Two times a day (BID) | ORAL | 3 refills | Status: DC

## 2020-07-18 NOTE — Telephone Encounter (Signed)
Scheduled appointments per 2/11 los. Spoke to patient who is aware of appointments dates and times.

## 2020-08-12 ENCOUNTER — Inpatient Hospital Stay: Payer: Federal, State, Local not specified - PPO | Attending: Oncology

## 2020-08-12 ENCOUNTER — Other Ambulatory Visit: Payer: Self-pay

## 2020-08-12 ENCOUNTER — Inpatient Hospital Stay: Payer: Federal, State, Local not specified - PPO

## 2020-08-12 VITALS — BP 153/91 | HR 66 | Resp 18

## 2020-08-12 DIAGNOSIS — C7B02 Secondary carcinoid tumors of liver: Secondary | ICD-10-CM | POA: Diagnosis not present

## 2020-08-12 DIAGNOSIS — N289 Disorder of kidney and ureter, unspecified: Secondary | ICD-10-CM | POA: Diagnosis not present

## 2020-08-12 DIAGNOSIS — R197 Diarrhea, unspecified: Secondary | ICD-10-CM | POA: Insufficient documentation

## 2020-08-12 DIAGNOSIS — E039 Hypothyroidism, unspecified: Secondary | ICD-10-CM | POA: Insufficient documentation

## 2020-08-12 DIAGNOSIS — C7A00 Malignant carcinoid tumor of unspecified site: Secondary | ICD-10-CM | POA: Diagnosis not present

## 2020-08-12 DIAGNOSIS — I10 Essential (primary) hypertension: Secondary | ICD-10-CM | POA: Diagnosis not present

## 2020-08-12 DIAGNOSIS — D3A098 Benign carcinoid tumors of other sites: Secondary | ICD-10-CM

## 2020-08-12 LAB — CBC WITH DIFFERENTIAL (CANCER CENTER ONLY)
Abs Immature Granulocytes: 0.01 10*3/uL (ref 0.00–0.07)
Basophils Absolute: 0 10*3/uL (ref 0.0–0.1)
Basophils Relative: 1 %
Eosinophils Absolute: 0.4 10*3/uL (ref 0.0–0.5)
Eosinophils Relative: 9 %
HCT: 41.3 % (ref 39.0–52.0)
Hemoglobin: 14.4 g/dL (ref 13.0–17.0)
Immature Granulocytes: 0 %
Lymphocytes Relative: 18 %
Lymphs Abs: 0.9 10*3/uL (ref 0.7–4.0)
MCH: 31.6 pg (ref 26.0–34.0)
MCHC: 34.9 g/dL (ref 30.0–36.0)
MCV: 90.6 fL (ref 80.0–100.0)
Monocytes Absolute: 0.6 10*3/uL (ref 0.1–1.0)
Monocytes Relative: 11 %
Neutro Abs: 3 10*3/uL (ref 1.7–7.7)
Neutrophils Relative %: 61 %
Platelet Count: 180 10*3/uL (ref 150–400)
RBC: 4.56 MIL/uL (ref 4.22–5.81)
RDW: 13.1 % (ref 11.5–15.5)
WBC Count: 4.9 10*3/uL (ref 4.0–10.5)
nRBC: 0 % (ref 0.0–0.2)

## 2020-08-12 LAB — CMP (CANCER CENTER ONLY)
ALT: 42 U/L (ref 0–44)
AST: 34 U/L (ref 15–41)
Albumin: 4 g/dL (ref 3.5–5.0)
Alkaline Phosphatase: 121 U/L (ref 38–126)
Anion gap: 7 (ref 5–15)
BUN: 16 mg/dL (ref 6–20)
CO2: 23 mmol/L (ref 22–32)
Calcium: 9.4 mg/dL (ref 8.9–10.3)
Chloride: 110 mmol/L (ref 98–111)
Creatinine: 1.42 mg/dL — ABNORMAL HIGH (ref 0.61–1.24)
GFR, Estimated: 58 mL/min — ABNORMAL LOW (ref >60)
Glucose, Bld: 104 mg/dL — ABNORMAL HIGH (ref 70–99)
Potassium: 4.6 mmol/L (ref 3.5–5.1)
Sodium: 140 mmol/L (ref 135–145)
Total Bilirubin: 0.8 mg/dL (ref 0.3–1.2)
Total Protein: 6.9 g/dL (ref 6.5–8.1)

## 2020-08-12 MED ORDER — LANREOTIDE ACETATE 120 MG/0.5ML ~~LOC~~ SOLN
120.0000 mg | Freq: Once | SUBCUTANEOUS | Status: AC
  Administered 2020-08-12: 120 mg via SUBCUTANEOUS
  Filled 2020-08-12: qty 120

## 2020-08-12 NOTE — Patient Instructions (Signed)
Lanreotide injection What is this medicine? LANREOTIDE (lan REE oh tide) is used to reduce blood levels of growth hormone in patients with a condition called acromegaly. It also works to slow or stop tumor growth in patients with neuroendocrine tumors and treat carcinoid syndrome. This medicine may be used for other purposes; ask your health care provider or pharmacist if you have questions. COMMON BRAND NAME(S): Somatuline [place] What should I tell my health care provider before I take this medicine? They need to know if you have any of these conditions:  diabetes  gallbladder disease  heart disease  kidney disease  liver disease  thyroid disease  an unusual or allergic reaction to lanreotide, other medicines, foods, dyes, or preservatives  pregnant or trying to get pregnant  breast-feeding How should I use this medicine? This medicine is for injection under the skin. It is given by a health care professional in a hospital or clinic setting. Contact your pediatrician or health care professional regarding the use of this medicine in children. Special care may be needed. Overdosage: If you think you have taken too much of this medicine contact a poison control center or emergency room at once. NOTE: This medicine is only for you. Do not share this medicine with others. What if I miss a dose? It is important not to miss your dose. Call your doctor or health care professional if you are unable to keep an appointment. What may interact with this medicine? This medicine may interact with the following medications:  bromocriptine  cyclosporine  certain medicines for blood pressure, heart disease, irregular heart beat  certain medicines for diabetes  quinidine  terfenadine This list may not describe all possible interactions. Give your health care provider a list of all the medicines, herbs, non-prescription drugs, or dietary supplements you use. Also tell them if you smoke,  drink alcohol, or use illegal drugs. Some items may interact with your medicine. What should I watch for while using this medicine? Tell your doctor or healthcare professional if your symptoms do not start to get better or if they get worse. Visit your doctor or health care professional for regular checks on your progress. Your condition will be monitored carefully while you are receiving this medicine. This medicine may increase blood sugar. Ask your healthcare provider if changes in diet or medicines are needed if you have diabetes. You may need blood work done while you are taking this medicine. Women should inform their doctor if they wish to become pregnant or think they might be pregnant. There is a potential for serious side effects to an unborn child. Talk to your health care professional or pharmacist for more information. Do not breast-feed an infant while taking this medicine or for 6 months after stopping it. This medicine has caused ovarian failure in some women. This medicine may interfere with the ability to have a child. Talk with your doctor or health care professional if you are concerned about your fertility. What side effects may I notice from receiving this medicine? Side effects that you should report to your doctor or health care professional as soon as possible:  allergic reactions like skin rash, itching or hives, swelling of the face, lips, or tongue  increased blood pressure  severe stomach pain  signs and symptoms of hgh blood sugar such as being more thirsty or hungry or having to urinate more than normal. You may also feel very tired or have blurry vision.  signs and symptoms of low blood   sugar such as feeling anxious; confusion; dizziness; increased hunger; unusually weak or tired; sweating; shakiness; cold; irritable; headache; blurred vision; fast heartbeat; loss of consciousness  unusually slow heartbeat Side effects that usually do not require medical  attention (report to your doctor or health care professional if they continue or are bothersome):  constipation  diarrhea  dizziness  headache  muscle pain  muscle spasms  nausea  pain, redness, or irritation at site where injected This list may not describe all possible side effects. Call your doctor for medical advice about side effects. You may report side effects to FDA at 1-800-FDA-1088. Where should I keep my medicine? This drug is given in a hospital or clinic and will not be stored at home. NOTE: This sheet is a summary. It may not cover all possible information. If you have questions about this medicine, talk to your doctor, pharmacist, or health care provider.  2021 Elsevier/Gold Standard (2018-02-27 09:13:08)  

## 2020-08-15 LAB — CHROMOGRANIN A: Chromogranin A (ng/mL): 2685 ng/mL — ABNORMAL HIGH (ref 0.0–101.8)

## 2020-09-09 ENCOUNTER — Inpatient Hospital Stay: Payer: Federal, State, Local not specified - PPO

## 2020-09-09 ENCOUNTER — Inpatient Hospital Stay: Payer: Federal, State, Local not specified - PPO | Attending: Nurse Practitioner | Admitting: Oncology

## 2020-09-09 ENCOUNTER — Ambulatory Visit: Payer: Federal, State, Local not specified - PPO

## 2020-09-09 ENCOUNTER — Telehealth: Payer: Self-pay | Admitting: Oncology

## 2020-09-09 ENCOUNTER — Other Ambulatory Visit: Payer: Self-pay

## 2020-09-09 VITALS — BP 142/90 | HR 60 | Temp 98.1°F | Resp 18 | Ht 71.0 in | Wt 220.4 lb

## 2020-09-09 DIAGNOSIS — C7B04 Secondary carcinoid tumors of peritoneum: Secondary | ICD-10-CM | POA: Insufficient documentation

## 2020-09-09 DIAGNOSIS — C7B02 Secondary carcinoid tumors of liver: Secondary | ICD-10-CM | POA: Diagnosis not present

## 2020-09-09 DIAGNOSIS — I10 Essential (primary) hypertension: Secondary | ICD-10-CM | POA: Insufficient documentation

## 2020-09-09 DIAGNOSIS — E039 Hypothyroidism, unspecified: Secondary | ICD-10-CM | POA: Diagnosis not present

## 2020-09-09 DIAGNOSIS — Z79899 Other long term (current) drug therapy: Secondary | ICD-10-CM | POA: Diagnosis not present

## 2020-09-09 DIAGNOSIS — N289 Disorder of kidney and ureter, unspecified: Secondary | ICD-10-CM | POA: Diagnosis not present

## 2020-09-09 DIAGNOSIS — D3A098 Benign carcinoid tumors of other sites: Secondary | ICD-10-CM | POA: Diagnosis not present

## 2020-09-09 DIAGNOSIS — I2510 Atherosclerotic heart disease of native coronary artery without angina pectoris: Secondary | ICD-10-CM | POA: Insufficient documentation

## 2020-09-09 DIAGNOSIS — I252 Old myocardial infarction: Secondary | ICD-10-CM | POA: Insufficient documentation

## 2020-09-09 DIAGNOSIS — C7A098 Malignant carcinoid tumors of other sites: Secondary | ICD-10-CM | POA: Insufficient documentation

## 2020-09-09 MED ORDER — LANREOTIDE ACETATE 120 MG/0.5ML ~~LOC~~ SOLN
120.0000 mg | Freq: Once | SUBCUTANEOUS | Status: AC
  Administered 2020-09-09: 120 mg via SUBCUTANEOUS
  Filled 2020-09-09: qty 120

## 2020-09-09 NOTE — Progress Notes (Signed)
[facility] OFFICE PROGRESS NOTE   Diagnosis: Carcinoid tumor  INTERVAL HISTORY:   Mr. Fonder returns as scheduled.  He continues monthly lanreotide.  No flushing or diarrhea.  Good appetite and energy level.  He is exercising.  No complaint.  Objective:  Vital signs in last 24 hours:  Blood pressure (!) 142/90, pulse 60, temperature 98.1 F (36.7 C), temperature source Tympanic, resp. rate 18, height $RemoveBe'5\' 11"'pjmnqwWhS$  (1.803 m), weight 220 lb 6.4 oz (100 kg), SpO2 100 %.    Resp: Lungs clear bilaterally Cardio: Regular rate and rhythm GI: No splenomegaly.  Fullness in the right subcostal region without a discrete liver edge Vascular: No leg edema     Lab Results:  Lab Results  Component Value Date   WBC 4.9 08/12/2020   HGB 14.4 08/12/2020   HCT 41.3 08/12/2020   MCV 90.6 08/12/2020   PLT 180 08/12/2020   NEUTROABS 3.0 08/12/2020    CMP  Lab Results  Component Value Date   NA 140 08/12/2020   K 4.6 08/12/2020   CL 110 08/12/2020   CO2 23 08/12/2020   GLUCOSE 104 (H) 08/12/2020   BUN 16 08/12/2020   CREATININE 1.42 (H) 08/12/2020   CALCIUM 9.4 08/12/2020   PROT 6.9 08/12/2020   ALBUMIN 4.0 08/12/2020   AST 34 08/12/2020   ALT 42 08/12/2020   ALKPHOS 121 08/12/2020   BILITOT 0.8 08/12/2020   GFRNONAA 58 (L) 08/12/2020   GFRAA >60 02/26/2020   Chromogranin A on 08/12/2020: 2685 Medications: I have reviewed the patient's current medications.   Assessment/Plan: 1. Metastatic neuroendocrine tumor, well-differentiated  10/31/2018 Abdominal ultrasound-numerous nearly confluent heterogeneous masses measuring up to 3.5 cm.   11/04/2018 CT abdomen/pelvis-multiple enhancing hepatic lesions and a central partially calcified spiculated mesenteric mass measuring 3.1 x 3.1 cm.   11/13/2018 biopsy liver lesion-metastatic well-differentiated neuroendocrine tumor positive for CKAE1-AE3, synaptophysin, chromogranin, CD56 and CDX-2; Ki-67 labeling index less than 3%;  TTF-1 negative  Netspot 12/12/2018-increased activity associated a lobular mesenteric mass, small bowel medial to the mesenteric mass, and multiple liver lesions  Elevated chromogranin A level and 24-hour urine 5 HIAA  Monthly lanreotide beginning 12/23/2018  CT 05/13/2019-multiple large hepatic metastases on the dotatate are poorly visualized, several small lesions are decreased in size 1 new lesion, stable mesenteric mass  Netspot 09/04/2019-increase in size of left and right liver lesions, stable to slightly decreased radiotracer accumulation, stable central mesenteric mass, no new metastatic lesions  Lutathera 10/13/2019  Lutathera 12/04/2019  Lutathera 01/29/2020  Lutathera 03/25/2020  Restaging Netspot 04/22/2020-overall positive measurable response to therapy.  Multiple hepatic metastasis continue to demonstrate intense radiotracer activity, the activity is consistently decreased from preradiotherapy scan.  No evidence of progressive disease in the liver.  Central mesenteric metastasis and adjacent small bowel lesion have very similar metabolic activity and no change in size.  No evidence of new peritoneal metastasis or skeletal metastasis.  Monthly lanreotide continued 2. History of MI/stent placement 3. CAD 4. Hypertension 5. Renal insufficiency 6. Hypothyroidism diagnosed with markedly elevated TSH and low T4 on 12/18/2018 7. Acute left elbow/right ankle pain and swelling August 2021      Disposition: William Hancock appears stable.  The chromogranin A was lower last month.  The plan is to continue monthly lanreotide.  He will return for an office visit in 3 months.  We will plan for a restaging Netspot at a 9 or 5-month interval.  Betsy Coder, MD  09/09/2020  9:04 AM

## 2020-09-09 NOTE — Patient Instructions (Signed)
Lanreotide injection What is this medicine? LANREOTIDE (lan REE oh tide) is used to reduce blood levels of growth hormone in patients with a condition called acromegaly. It also works to slow or stop tumor growth in patients with neuroendocrine tumors and treat carcinoid syndrome. This medicine may be used for other purposes; ask your health care provider or pharmacist if you have questions. COMMON BRAND NAME(S): Somatuline [place] What should I tell my health care provider before I take this medicine? They need to know if you have any of these conditions:  diabetes  gallbladder disease  heart disease  kidney disease  liver disease  thyroid disease  an unusual or allergic reaction to lanreotide, other medicines, foods, dyes, or preservatives  pregnant or trying to get pregnant  breast-feeding How should I use this medicine? This medicine is for injection under the skin. It is given by a health care professional in a hospital or clinic setting. Contact your pediatrician or health care professional regarding the use of this medicine in children. Special care may be needed. Overdosage: If you think you have taken too much of this medicine contact a poison control center or emergency room at once. NOTE: This medicine is only for you. Do not share this medicine with others. What if I miss a dose? It is important not to miss your dose. Call your doctor or health care professional if you are unable to keep an appointment. What may interact with this medicine? This medicine may interact with the following medications:  bromocriptine  cyclosporine  certain medicines for blood pressure, heart disease, irregular heart beat  certain medicines for diabetes  quinidine  terfenadine This list may not describe all possible interactions. Give your health care provider a list of all the medicines, herbs, non-prescription drugs, or dietary supplements you use. Also tell them if you smoke,  drink alcohol, or use illegal drugs. Some items may interact with your medicine. What should I watch for while using this medicine? Tell your doctor or healthcare professional if your symptoms do not start to get better or if they get worse. Visit your doctor or health care professional for regular checks on your progress. Your condition will be monitored carefully while you are receiving this medicine. This medicine may increase blood sugar. Ask your healthcare provider if changes in diet or medicines are needed if you have diabetes. You may need blood work done while you are taking this medicine. Women should inform their doctor if they wish to become pregnant or think they might be pregnant. There is a potential for serious side effects to an unborn child. Talk to your health care professional or pharmacist for more information. Do not breast-feed an infant while taking this medicine or for 6 months after stopping it. This medicine has caused ovarian failure in some women. This medicine may interfere with the ability to have a child. Talk with your doctor or health care professional if you are concerned about your fertility. What side effects may I notice from receiving this medicine? Side effects that you should report to your doctor or health care professional as soon as possible:  allergic reactions like skin rash, itching or hives, swelling of the face, lips, or tongue  increased blood pressure  severe stomach pain  signs and symptoms of hgh blood sugar such as being more thirsty or hungry or having to urinate more than normal. You may also feel very tired or have blurry vision.  signs and symptoms of low blood   sugar such as feeling anxious; confusion; dizziness; increased hunger; unusually weak or tired; sweating; shakiness; cold; irritable; headache; blurred vision; fast heartbeat; loss of consciousness  unusually slow heartbeat Side effects that usually do not require medical  attention (report to your doctor or health care professional if they continue or are bothersome):  constipation  diarrhea  dizziness  headache  muscle pain  muscle spasms  nausea  pain, redness, or irritation at site where injected This list may not describe all possible side effects. Call your doctor for medical advice about side effects. You may report side effects to FDA at 1-800-FDA-1088. Where should I keep my medicine? This drug is given in a hospital or clinic and will not be stored at home. NOTE: This sheet is a summary. It may not cover all possible information. If you have questions about this medicine, talk to your doctor, pharmacist, or health care provider.  2021 Elsevier/Gold Standard (2018-02-27 09:13:08)  

## 2020-09-09 NOTE — Telephone Encounter (Signed)
Scheduled appt per 4/8 LOS - pt is aware of appt - mychart active. Per pt no print out needed.

## 2020-10-05 ENCOUNTER — Inpatient Hospital Stay: Payer: Federal, State, Local not specified - PPO

## 2020-10-06 ENCOUNTER — Inpatient Hospital Stay: Payer: Federal, State, Local not specified - PPO | Attending: Nurse Practitioner

## 2020-10-06 ENCOUNTER — Other Ambulatory Visit: Payer: Self-pay

## 2020-10-06 VITALS — BP 138/90 | HR 60 | Temp 98.0°F | Resp 18

## 2020-10-06 DIAGNOSIS — D3A098 Benign carcinoid tumors of other sites: Secondary | ICD-10-CM

## 2020-10-06 DIAGNOSIS — C7A098 Malignant carcinoid tumors of other sites: Secondary | ICD-10-CM | POA: Insufficient documentation

## 2020-10-06 MED ORDER — LANREOTIDE ACETATE 120 MG/0.5ML ~~LOC~~ SOLN
120.0000 mg | Freq: Once | SUBCUTANEOUS | Status: AC
  Administered 2020-10-06: 120 mg via SUBCUTANEOUS

## 2020-10-06 NOTE — Progress Notes (Signed)
William Hancock presents today for injection per the provider's orders.  Lanreotide administration without incident; injection site WNL; see MAR for injection details.  Patient tolerated procedure well and without incident.  No questions or complaints noted at this time. Pt stable during and after injection.  AVS reviewed.  Vital signs stable for injection.  Discharged in stable condition ambulatory.

## 2020-10-06 NOTE — Patient Instructions (Signed)
[facility]  Discharge Instructions: Thank you for choosing [facility] to provide your oncology and hematology care.   If you have a lab appointment with the [facility], please go directly to the [facility] and check in at the registration area.   Wear comfortable clothing and clothing appropriate for easy access to any Portacath or PICC line.   We strive to give you quality time with your provider. You may need to reschedule your appointment if you arrive late (15 or more minutes).  Arriving late affects you and other patients whose appointments are after yours.  Also, if you miss three or more appointments without notifying the office, you may be dismissed from the clinic at the provider's discretion.      For prescription refill requests, have your pharmacy contact our office and allow 72 hours for refills to be completed.    Lanreotide today.  Follow up as scheduled.     To help prevent nausea and vomiting after your treatment, we encourage you to take your nausea medication as directed.  BELOW ARE SYMPTOMS THAT SHOULD BE REPORTED IMMEDIATELY: . *FEVER GREATER THAN 100.4 F (38 C) OR HIGHER . *CHILLS OR SWEATING . *NAUSEA AND VOMITING THAT IS NOT CONTROLLED WITH YOUR NAUSEA MEDICATION . *UNUSUAL SHORTNESS OF BREATH . *UNUSUAL BRUISING OR BLEEDING . *URINARY PROBLEMS (pain or burning when urinating, or frequent urination) . *BOWEL PROBLEMS (unusual diarrhea, constipation, pain near the anus) . TENDERNESS IN MOUTH AND THROAT WITH OR WITHOUT PRESENCE OF ULCERS (sore throat, sores in mouth, or a toothache) . UNUSUAL RASH, SWELLING OR PAIN  . UNUSUAL VAGINAL DISCHARGE OR ITCHING   Items with * indicate a potential emergency and should be followed up as soon as possible or go to the Emergency Department if any problems should occur.  Please show the CHEMOTHERAPY ALERT CARD or IMMUNOTHERAPY ALERT CARD at check-in to the Emergency Department  and triage nurse.  Should you have questions after your visit or need to cancel or reschedule your appointment, please contact [facility]  Dept: [phone]  and follow the prompts.  Office hours are 8:00 a.m. to 4:30 p.m. Monday - Friday. Please note that voicemails left after 4:00 p.m. may not be returned until the following business day.  We are closed weekends and major holidays. You have access to a nurse at all times for urgent questions. Please call the main number to the clinic Dept: [phone] and follow the prompts.   For any non-urgent questions, you may also contact your provider using MyChart. We now offer e-Visits for anyone 57 and older to request care online for non-urgent symptoms. For details visit [email or web address].   Also download the MyChart app! Go to the app store, search "MyChart", open the app, select , and log in with your MyChart username and password.  Due to Covid, a mask is required upon entering the hospital/clinic. If you do not have a mask, one will be given to you upon arrival. For doctor visits, patients may have 1 support person aged 28 or older with them. For treatment visits, patients cannot have anyone with them due to current Covid guidelines and our immunocompromised population.

## 2020-11-02 ENCOUNTER — Inpatient Hospital Stay: Payer: Federal, State, Local not specified - PPO

## 2020-11-02 ENCOUNTER — Inpatient Hospital Stay: Payer: Federal, State, Local not specified - PPO | Attending: Nurse Practitioner

## 2020-11-02 ENCOUNTER — Other Ambulatory Visit: Payer: Self-pay

## 2020-11-02 VITALS — BP 129/85 | HR 59 | Temp 97.8°F | Resp 20

## 2020-11-02 DIAGNOSIS — D3A098 Benign carcinoid tumors of other sites: Secondary | ICD-10-CM

## 2020-11-02 DIAGNOSIS — C7B02 Secondary carcinoid tumors of liver: Secondary | ICD-10-CM | POA: Insufficient documentation

## 2020-11-02 DIAGNOSIS — C7A098 Malignant carcinoid tumors of other sites: Secondary | ICD-10-CM | POA: Insufficient documentation

## 2020-11-02 LAB — CMP (CANCER CENTER ONLY)
ALT: 32 U/L (ref 0–44)
AST: 25 U/L (ref 15–41)
Albumin: 4.1 g/dL (ref 3.5–5.0)
Alkaline Phosphatase: 101 U/L (ref 38–126)
Anion gap: 7 (ref 5–15)
BUN: 22 mg/dL — ABNORMAL HIGH (ref 6–20)
CO2: 26 mmol/L (ref 22–32)
Calcium: 9.3 mg/dL (ref 8.9–10.3)
Chloride: 105 mmol/L (ref 98–111)
Creatinine: 1.44 mg/dL — ABNORMAL HIGH (ref 0.61–1.24)
GFR, Estimated: 57 mL/min — ABNORMAL LOW (ref >60)
Glucose, Bld: 100 mg/dL — ABNORMAL HIGH (ref 70–99)
Potassium: 4.7 mmol/L (ref 3.5–5.1)
Sodium: 138 mmol/L (ref 135–145)
Total Bilirubin: 1.2 mg/dL (ref 0.3–1.2)
Total Protein: 6.3 g/dL — ABNORMAL LOW (ref 6.5–8.1)

## 2020-11-02 MED ORDER — LANREOTIDE ACETATE 120 MG/0.5ML ~~LOC~~ SOLN
120.0000 mg | Freq: Once | SUBCUTANEOUS | Status: AC
  Administered 2020-11-02: 120 mg via SUBCUTANEOUS

## 2020-11-02 NOTE — Patient Instructions (Signed)
[facility]  Discharge Instructions: Thank you for choosing [facility] to provide your oncology and hematology care.   If you have a lab appointment with the [facility], please go directly to the [facility] and check in at the registration area.   Wear comfortable clothing and clothing appropriate for easy access to any Portacath or PICC line.   We strive to give you quality time with your provider. You may need to reschedule your appointment if you arrive late (15 or more minutes).  Arriving late affects you and other patients whose appointments are after yours.  Also, if you miss three or more appointments without notifying the office, you may be dismissed from the clinic at the provider's discretion.      For prescription refill requests, have your pharmacy contact our office and allow 72 hours for refills to be completed.    Today you received the following chemotherapy and/or immunotherapy agents lanreotide   To help prevent nausea and vomiting after your treatment, we encourage you to take your nausea medication as directed.  BELOW ARE SYMPTOMS THAT SHOULD BE REPORTED IMMEDIATELY: . *FEVER GREATER THAN 100.4 F (38 C) OR HIGHER . *CHILLS OR SWEATING . *NAUSEA AND VOMITING THAT IS NOT CONTROLLED WITH YOUR NAUSEA MEDICATION . *UNUSUAL SHORTNESS OF BREATH . *UNUSUAL BRUISING OR BLEEDING . *URINARY PROBLEMS (pain or burning when urinating, or frequent urination) . *BOWEL PROBLEMS (unusual diarrhea, constipation, pain near the anus) . TENDERNESS IN MOUTH AND THROAT WITH OR WITHOUT PRESENCE OF ULCERS (sore throat, sores in mouth, or a toothache) . UNUSUAL RASH, SWELLING OR PAIN  . UNUSUAL VAGINAL DISCHARGE OR ITCHING   Items with * indicate a potential emergency and should be followed up as soon as possible or go to the Emergency Department if any problems should occur.  Please show the CHEMOTHERAPY ALERT CARD or IMMUNOTHERAPY ALERT CARD  at check-in to the Emergency Department and triage nurse.  Should you have questions after your visit or need to cancel or reschedule your appointment, please contact [facility]  Dept: [phone]  and follow the prompts.  Office hours are 8:00 a.m. to 4:30 p.m. Monday - Friday. Please note that voicemails left after 4:00 p.m. may not be returned until the following business day.  We are closed weekends and major holidays. You have access to a nurse at all times for urgent questions. Please call the main number to the clinic Dept: [phone] and follow the prompts.   For any non-urgent questions, you may also contact your provider using MyChart. We now offer e-Visits for anyone 58 and older to request care online for non-urgent symptoms. For details visit [email or web address].   Also download the MyChart app! Go to the app store, search "MyChart", open the app, select [facility], and log in with your MyChart username and password.  Due to Covid, a mask is required upon entering the hospital/clinic. If you do not have a mask, one will be given to you upon arrival. For doctor visits, patients may have 1 support person aged 41 or older with them. For treatment visits, patients cannot have anyone with them due to current Covid guidelines and our immunocompromised population.   Lanreotide injection What is this medicine? LANREOTIDE (lan REE oh tide) is used to reduce blood levels of growth hormone in patients with a condition called acromegaly. It also works to slow or stop tumor growth in patients with neuroendocrine tumors and  treat carcinoid syndrome. This medicine may be used for other purposes; ask your health care provider or pharmacist if you have questions. COMMON BRAND NAME(S): Somatuline Depot What should I tell my health care provider before I take this medicine? They need to know if you have any of these conditions:  diabetes  gallbladder  disease  heart disease  kidney disease  liver disease  thyroid disease  an unusual or allergic reaction to lanreotide, other medicines, foods, dyes, or preservatives  pregnant or trying to get pregnant  breast-feeding How should I use this medicine? This medicine is for injection under the skin. It is given by a health care professional in a hospital or clinic setting. Contact your pediatrician or health care professional regarding the use of this medicine in children. Special care may be needed. Overdosage: If you think you have taken too much of this medicine contact a poison control center or emergency room at once. NOTE: This medicine is only for you. Do not share this medicine with others. What if I miss a dose? It is important not to miss your dose. Call your doctor or health care professional if you are unable to keep an appointment. What may interact with this medicine? This medicine may interact with the following medications:  bromocriptine  cyclosporine  certain medicines for blood pressure, heart disease, irregular heart beat  certain medicines for diabetes  quinidine  terfenadine This list may not describe all possible interactions. Give your health care provider a list of all the medicines, herbs, non-prescription drugs, or dietary supplements you use. Also tell them if you smoke, drink alcohol, or use illegal drugs. Some items may interact with your medicine. What should I watch for while using this medicine? Tell your doctor or healthcare professional if your symptoms do not start to get better or if they get worse. Visit your doctor or health care professional for regular checks on your progress. Your condition will be monitored carefully while you are receiving this medicine. This medicine may increase blood sugar. Ask your healthcare provider if changes in diet or medicines are needed if you have diabetes. You may need blood work done while you are taking  this medicine. Women should inform their doctor if they wish to become pregnant or think they might be pregnant. There is a potential for serious side effects to an unborn child. Talk to your health care professional or pharmacist for more information. Do not breast-feed an infant while taking this medicine or for 6 months after stopping it. This medicine has caused ovarian failure in some women. This medicine may interfere with the ability to have a child. Talk with your doctor or health care professional if you are concerned about your fertility. What side effects may I notice from receiving this medicine? Side effects that you should report to your doctor or health care professional as soon as possible:  allergic reactions like skin rash, itching or hives, swelling of the face, lips, or tongue  increased blood pressure  severe stomach pain  signs and symptoms of hgh blood sugar such as being more thirsty or hungry or having to urinate more than normal. You may also feel very tired or have blurry vision.  signs and symptoms of low blood sugar such as feeling anxious; confusion; dizziness; increased hunger; unusually weak or tired; sweating; shakiness; cold; irritable; headache; blurred vision; fast heartbeat; loss of consciousness  unusually slow heartbeat Side effects that usually do not require medical attention (report to your  doctor or health care professional if they continue or are bothersome):  constipation  diarrhea  dizziness  headache  muscle pain  muscle spasms  nausea  pain, redness, or irritation at site where injected This list may not describe all possible side effects. Call your doctor for medical advice about side effects. You may report side effects to FDA at 1-800-FDA-1088. Where should I keep my medicine? This drug is given in a hospital or clinic and will not be stored at home. NOTE: This sheet is a summary. It may not cover all possible information. If you  have questions about this medicine, talk to your doctor, pharmacist, or health care provider.  2021 Elsevier/Gold Standard (2018-02-27 09:13:08)

## 2020-11-03 LAB — CHROMOGRANIN A: Chromogranin A (ng/mL): 2838 ng/mL — ABNORMAL HIGH (ref 0.0–101.8)

## 2020-11-29 ENCOUNTER — Inpatient Hospital Stay (HOSPITAL_BASED_OUTPATIENT_CLINIC_OR_DEPARTMENT_OTHER): Payer: Federal, State, Local not specified - PPO | Admitting: Oncology

## 2020-11-29 ENCOUNTER — Inpatient Hospital Stay: Payer: Federal, State, Local not specified - PPO

## 2020-11-29 ENCOUNTER — Other Ambulatory Visit: Payer: Self-pay

## 2020-11-29 VITALS — BP 139/91 | HR 62 | Temp 97.6°F | Resp 18 | Ht 71.0 in | Wt 216.4 lb

## 2020-11-29 DIAGNOSIS — C7B02 Secondary carcinoid tumors of liver: Secondary | ICD-10-CM | POA: Diagnosis not present

## 2020-11-29 DIAGNOSIS — C7A098 Malignant carcinoid tumors of other sites: Secondary | ICD-10-CM | POA: Diagnosis not present

## 2020-11-29 DIAGNOSIS — D3A098 Benign carcinoid tumors of other sites: Secondary | ICD-10-CM

## 2020-11-29 MED ORDER — LANREOTIDE ACETATE 120 MG/0.5ML ~~LOC~~ SOLN
120.0000 mg | Freq: Once | SUBCUTANEOUS | Status: AC
  Administered 2020-11-29: 120 mg via SUBCUTANEOUS

## 2020-11-29 NOTE — Patient Instructions (Signed)
Lanreotide injection What is this medication? LANREOTIDE (lan REE oh tide) is used to reduce blood levels of growth hormone in patients with a condition called acromegaly. It also works to slow or stop tumor growth in patients with neuroendocrine tumors and treat carcinoid syndrome. This medicine may be used for other purposes; ask your health care provider or pharmacist if you have questions. COMMON BRAND NAME(S): Somatuline Depot What should I tell my care team before I take this medication? They need to know if you have any of these conditions: diabetes gallbladder disease heart disease kidney disease liver disease thyroid disease an unusual or allergic reaction to lanreotide, other medicines, foods, dyes, or preservatives pregnant or trying to get pregnant breast-feeding How should I use this medication? This medicine is for injection under the skin. It is given by a health care professional in a hospital or clinic setting. Contact your pediatrician or health care professional regarding the use of this medicine in children. Special care may be needed. Overdosage: If you think you have taken too much of this medicine contact a poison control center or emergency room at once. NOTE: This medicine is only for you. Do not share this medicine with others. What if I miss a dose? It is important not to miss your dose. Call your doctor or health care professional if you are unable to keep an appointment. What may interact with this medication? This medicine may interact with the following medications: bromocriptine cyclosporine certain medicines for blood pressure, heart disease, irregular heart beat certain medicines for diabetes quinidine terfenadine This list may not describe all possible interactions. Give your health care provider a list of all the medicines, herbs, non-prescription drugs, or dietary supplements you use. Also tell them if you smoke, drink alcohol, or use illegal drugs.  Some items may interact with your medicine. What should I watch for while using this medication? Tell your doctor or healthcare professional if your symptoms do not start to get better or if they get worse. Visit your doctor or health care professional for regular checks on your progress. Your condition will be monitored carefully while you are receiving this medicine. This medicine may increase blood sugar. Ask your healthcare provider if changes in diet or medicines are needed if you have diabetes. You may need blood work done while you are taking this medicine. Women should inform their doctor if they wish to become pregnant or think they might be pregnant. There is a potential for serious side effects to an unborn child. Talk to your health care professional or pharmacist for more information. Do not breast-feed an infant while taking this medicine or for 6 months after stopping it. This medicine has caused ovarian failure in some women. This medicine may interfere with the ability to have a child. Talk with your doctor or health care professional if you are concerned about your fertility. What side effects may I notice from receiving this medication? Side effects that you should report to your doctor or health care professional as soon as possible: allergic reactions like skin rash, itching or hives, swelling of the face, lips, or tongue increased blood pressure severe stomach pain signs and symptoms of hgh blood sugar such as being more thirsty or hungry or having to urinate more than normal. You may also feel very tired or have blurry vision. signs and symptoms of low blood sugar such as feeling anxious; confusion; dizziness; increased hunger; unusually weak or tired; sweating; shakiness; cold; irritable; headache; blurred vision; fast   heartbeat; loss of consciousness unusually slow heartbeat Side effects that usually do not require medical attention (report to your doctor or health care  professional if they continue or are bothersome): constipation diarrhea dizziness headache muscle pain muscle spasms nausea pain, redness, or irritation at site where injected This list may not describe all possible side effects. Call your doctor for medical advice about side effects. You may report side effects to FDA at 1-800-FDA-1088. Where should I keep my medication? This drug is given in a hospital or clinic and will not be stored at home. NOTE: This sheet is a summary. It may not cover all possible information. If you have questions about this medicine, talk to your doctor, pharmacist, or health care provider.  2022 Elsevier/Gold Standard (2018-02-27 09:13:08)  

## 2020-11-29 NOTE — Progress Notes (Signed)
[facility] OFFICE PROGRESS NOTE   Diagnosis: Carcinoid tumor  INTERVAL HISTORY:   William Hancock returns as scheduled.  He continues monthly lanreotide.  No diarrhea or fever.  He has flushing only when he drinks alcohol.  No other complaint.  Objective:  Vital signs in last 24 hours:  Blood pressure (!) 139/91, pulse 62, temperature 97.6 F (36.4 C), temperature source Oral, resp. rate 18, height $RemoveBe'5\' 11"'lkWRYjnyz$  (1.803 m), weight 216 lb 6.4 oz (98.2 kg), SpO2 99 %.    Resp: Lungs clear bilaterally Cardio: Regular rate and rhythm GI: The liver is palpable in the right upper abdomen, nontender, no splenomegaly Vascular: No leg edema   Lab Results:  Lab Results  Component Value Date   WBC 4.9 08/12/2020   HGB 14.4 08/12/2020   HCT 41.3 08/12/2020   MCV 90.6 08/12/2020   PLT 180 08/12/2020   NEUTROABS 3.0 08/12/2020    CMP  Lab Results  Component Value Date   NA 138 11/02/2020   K 4.7 11/02/2020   CL 105 11/02/2020   CO2 26 11/02/2020   GLUCOSE 100 (H) 11/02/2020   BUN 22 (H) 11/02/2020   CREATININE 1.44 (H) 11/02/2020   CALCIUM 9.3 11/02/2020   PROT 6.3 (L) 11/02/2020   ALBUMIN 4.1 11/02/2020   AST 25 11/02/2020   ALT 32 11/02/2020   ALKPHOS 101 11/02/2020   BILITOT 1.2 11/02/2020   GFRNONAA 57 (L) 11/02/2020   GFRAA >60 02/26/2020   Chromogranin A on 11/02/2020: 2838  Medications: I have reviewed the patient's current medications.   Assessment/Plan: Metastatic neuroendocrine tumor, well-differentiated 10/31/2018 Abdominal ultrasound-numerous nearly confluent heterogeneous masses measuring up to 3.5 cm.   11/04/2018 CT abdomen/pelvis-multiple enhancing hepatic lesions and a central partially calcified spiculated mesenteric mass measuring 3.1 x 3.1 cm.   11/13/2018 biopsy liver lesion-metastatic well-differentiated neuroendocrine tumor positive for CKAE1-AE3, synaptophysin, chromogranin, CD56 and CDX-2; Ki-67 labeling index less than 3%; TTF-1  negative Netspot 12/12/2018- increased activity associated a lobular mesenteric mass, small bowel medial to the mesenteric mass, and multiple liver lesions Elevated chromogranin A level and 24-hour urine 5 HIAA Monthly lanreotide beginning 12/23/2018 CT 05/13/2019-multiple large hepatic metastases on the dotatate are poorly visualized, several small lesions are decreased in size 1 new lesion, stable mesenteric mass Netspot 09/04/2019-increase in size of left and right liver lesions, stable to slightly decreased radiotracer accumulation, stable central mesenteric mass, no new metastatic lesions Lutathera 10/13/2019 Lutathera 12/04/2019 Lutathera 01/29/2020 Lutathera 03/25/2020 Restaging Netspot 04/22/2020-overall positive measurable response to therapy.  Multiple hepatic metastasis continue to demonstrate intense radiotracer activity, the activity is consistently decreased from preradiotherapy scan.  No evidence of progressive disease in the liver.  Central mesenteric metastasis and adjacent small bowel lesion have very similar metabolic activity and no change in size.  No evidence of new peritoneal metastasis or skeletal metastasis. Monthly lanreotide continued History of MI/stent placement CAD Hypertension Renal insufficiency Hypothyroidism diagnosed with markedly elevated TSH and low T4 on 12/18/2018 Acute left elbow/right ankle pain and swelling August 2021        Disposition: William Hancock appears stable.  There is no clinical evidence for progression of the carcinoid tumor.  He will continue monthly lanreotide.  The chromogranin a level was stable earlier this month.  We will check a chromogranin a level in 2 months.  He will return for an office visit in 3 months.  We will plan on a restaging Netspot for November 2022.  Betsy Coder, MD  11/29/2020  8:30  AM    

## 2020-11-29 NOTE — Progress Notes (Signed)
Lanreotide 120 MG Injection administered to Right superior external quadrant of the buttock. Patient tolerated injection without any complain or concerns verbalized. Patient stated he is on a blood thinner and bled through his pants after his last injection, no bleeding noted after this injection administration, however, gauze and tape dressing was applied to injection site.

## 2020-11-30 ENCOUNTER — Inpatient Hospital Stay: Payer: Federal, State, Local not specified - PPO

## 2020-11-30 ENCOUNTER — Inpatient Hospital Stay: Payer: Federal, State, Local not specified - PPO | Admitting: Oncology

## 2020-12-27 ENCOUNTER — Other Ambulatory Visit: Payer: Self-pay

## 2020-12-27 ENCOUNTER — Inpatient Hospital Stay: Payer: Federal, State, Local not specified - PPO | Attending: Nurse Practitioner

## 2020-12-27 VITALS — BP 138/90 | HR 73 | Temp 97.8°F | Resp 18

## 2020-12-27 DIAGNOSIS — D3A098 Benign carcinoid tumors of other sites: Secondary | ICD-10-CM

## 2020-12-27 DIAGNOSIS — C7B02 Secondary carcinoid tumors of liver: Secondary | ICD-10-CM | POA: Diagnosis not present

## 2020-12-27 DIAGNOSIS — C7A098 Malignant carcinoid tumors of other sites: Secondary | ICD-10-CM | POA: Diagnosis not present

## 2020-12-27 MED ORDER — LANREOTIDE ACETATE 120 MG/0.5ML ~~LOC~~ SOLN
120.0000 mg | Freq: Once | SUBCUTANEOUS | Status: AC
  Administered 2020-12-27: 120 mg via SUBCUTANEOUS

## 2020-12-27 NOTE — Patient Instructions (Signed)
Lanreotide injection What is this medication? LANREOTIDE (lan REE oh tide) is used to reduce blood levels of growth hormone in patients with a condition called acromegaly. It also works to slow or stop tumor growth in patients with neuroendocrine tumors and treat carcinoid syndrome. This medicine may be used for other purposes; ask your health care provider or pharmacist if you have questions. COMMON BRAND NAME(S): Somatuline Depot What should I tell my care team before I take this medication? They need to know if you have any of these conditions: diabetes gallbladder disease heart disease kidney disease liver disease thyroid disease an unusual or allergic reaction to lanreotide, other medicines, foods, dyes, or preservatives pregnant or trying to get pregnant breast-feeding How should I use this medication? This medicine is for injection under the skin. It is given by a health care professional in a hospital or clinic setting. Contact your pediatrician or health care professional regarding the use of this medicine in children. Special care may be needed. Overdosage: If you think you have taken too much of this medicine contact a poison control center or emergency room at once. NOTE: This medicine is only for you. Do not share this medicine with others. What if I miss a dose? It is important not to miss your dose. Call your doctor or health care professional if you are unable to keep an appointment. What may interact with this medication? This medicine may interact with the following medications: bromocriptine cyclosporine certain medicines for blood pressure, heart disease, irregular heart beat certain medicines for diabetes quinidine terfenadine This list may not describe all possible interactions. Give your health care provider a list of all the medicines, herbs, non-prescription drugs, or dietary supplements you use. Also tell them if you smoke, drink alcohol, or use illegal drugs.  Some items may interact with your medicine. What should I watch for while using this medication? Tell your doctor or healthcare professional if your symptoms do not start to get better or if they get worse. Visit your doctor or health care professional for regular checks on your progress. Your condition will be monitored carefully while you are receiving this medicine. This medicine may increase blood sugar. Ask your healthcare provider if changes in diet or medicines are needed if you have diabetes. You may need blood work done while you are taking this medicine. Women should inform their doctor if they wish to become pregnant or think they might be pregnant. There is a potential for serious side effects to an unborn child. Talk to your health care professional or pharmacist for more information. Do not breast-feed an infant while taking this medicine or for 6 months after stopping it. This medicine has caused ovarian failure in some women. This medicine may interfere with the ability to have a child. Talk with your doctor or health care professional if you are concerned about your fertility. What side effects may I notice from receiving this medication? Side effects that you should report to your doctor or health care professional as soon as possible: allergic reactions like skin rash, itching or hives, swelling of the face, lips, or tongue increased blood pressure severe stomach pain signs and symptoms of hgh blood sugar such as being more thirsty or hungry or having to urinate more than normal. You may also feel very tired or have blurry vision. signs and symptoms of low blood sugar such as feeling anxious; confusion; dizziness; increased hunger; unusually weak or tired; sweating; shakiness; cold; irritable; headache; blurred vision; fast   heartbeat; loss of consciousness unusually slow heartbeat Side effects that usually do not require medical attention (report to your doctor or health care  professional if they continue or are bothersome): constipation diarrhea dizziness headache muscle pain muscle spasms nausea pain, redness, or irritation at site where injected This list may not describe all possible side effects. Call your doctor for medical advice about side effects. You may report side effects to FDA at 1-800-FDA-1088. Where should I keep my medication? This drug is given in a hospital or clinic and will not be stored at home. NOTE: This sheet is a summary. It may not cover all possible information. If you have questions about this medicine, talk to your doctor, pharmacist, or health care provider.  2022 Elsevier/Gold Standard (2018-02-27 09:13:08)  

## 2021-01-24 ENCOUNTER — Other Ambulatory Visit: Payer: Self-pay

## 2021-01-24 ENCOUNTER — Inpatient Hospital Stay: Payer: Federal, State, Local not specified - PPO

## 2021-01-24 ENCOUNTER — Inpatient Hospital Stay: Payer: Federal, State, Local not specified - PPO | Attending: Nurse Practitioner

## 2021-01-24 VITALS — BP 133/88 | HR 56 | Temp 98.0°F | Resp 18

## 2021-01-24 DIAGNOSIS — C7A098 Malignant carcinoid tumors of other sites: Secondary | ICD-10-CM | POA: Diagnosis not present

## 2021-01-24 DIAGNOSIS — D3A098 Benign carcinoid tumors of other sites: Secondary | ICD-10-CM

## 2021-01-24 DIAGNOSIS — C7B02 Secondary carcinoid tumors of liver: Secondary | ICD-10-CM | POA: Diagnosis not present

## 2021-01-24 LAB — CMP (CANCER CENTER ONLY)
ALT: 30 U/L (ref 0–44)
AST: 23 U/L (ref 15–41)
Albumin: 4.2 g/dL (ref 3.5–5.0)
Alkaline Phosphatase: 85 U/L (ref 38–126)
Anion gap: 7 (ref 5–15)
BUN: 19 mg/dL (ref 6–20)
CO2: 27 mmol/L (ref 22–32)
Calcium: 9.8 mg/dL (ref 8.9–10.3)
Chloride: 107 mmol/L (ref 98–111)
Creatinine: 1.53 mg/dL — ABNORMAL HIGH (ref 0.61–1.24)
GFR, Estimated: 53 mL/min — ABNORMAL LOW (ref >60)
Glucose, Bld: 107 mg/dL — ABNORMAL HIGH (ref 70–99)
Potassium: 4.9 mmol/L (ref 3.5–5.1)
Sodium: 141 mmol/L (ref 135–145)
Total Bilirubin: 0.9 mg/dL (ref 0.3–1.2)
Total Protein: 6.9 g/dL (ref 6.5–8.1)

## 2021-01-24 LAB — CBC WITH DIFFERENTIAL (CANCER CENTER ONLY)
Abs Immature Granulocytes: 0.02 10*3/uL (ref 0.00–0.07)
Basophils Absolute: 0 10*3/uL (ref 0.0–0.1)
Basophils Relative: 1 %
Eosinophils Absolute: 0.4 10*3/uL (ref 0.0–0.5)
Eosinophils Relative: 9 %
HCT: 42.7 % (ref 39.0–52.0)
Hemoglobin: 14.8 g/dL (ref 13.0–17.0)
Immature Granulocytes: 0 %
Lymphocytes Relative: 20 %
Lymphs Abs: 1 10*3/uL (ref 0.7–4.0)
MCH: 31.8 pg (ref 26.0–34.0)
MCHC: 34.7 g/dL (ref 30.0–36.0)
MCV: 91.6 fL (ref 80.0–100.0)
Monocytes Absolute: 0.6 10*3/uL (ref 0.1–1.0)
Monocytes Relative: 11 %
Neutro Abs: 3.1 10*3/uL (ref 1.7–7.7)
Neutrophils Relative %: 59 %
Platelet Count: 157 10*3/uL (ref 150–400)
RBC: 4.66 MIL/uL (ref 4.22–5.81)
RDW: 12.4 % (ref 11.5–15.5)
WBC Count: 5.2 10*3/uL (ref 4.0–10.5)
nRBC: 0 % (ref 0.0–0.2)

## 2021-01-24 MED ORDER — LANREOTIDE ACETATE 120 MG/0.5ML ~~LOC~~ SOLN
120.0000 mg | Freq: Once | SUBCUTANEOUS | Status: AC
  Administered 2021-01-24: 120 mg via SUBCUTANEOUS

## 2021-01-24 NOTE — Progress Notes (Signed)
Lanreotide given in the left buttock with out difficulty.

## 2021-01-24 NOTE — Patient Instructions (Signed)
Lanreotide injection What is this medication? LANREOTIDE (lan REE oh tide) is used to reduce blood levels of growth hormone in patients with a condition called acromegaly. It also works to slow or stop tumor growth in patients with neuroendocrine tumors and treat carcinoid syndrome. This medicine may be used for other purposes; ask your health care provider or pharmacist if you have questions. COMMON BRAND NAME(S): Somatuline Depot What should I tell my care team before I take this medication? They need to know if you have any of these conditions: diabetes gallbladder disease heart disease kidney disease liver disease thyroid disease an unusual or allergic reaction to lanreotide, other medicines, foods, dyes, or preservatives pregnant or trying to get pregnant breast-feeding How should I use this medication? This medicine is for injection under the skin. It is given by a health care professional in a hospital or clinic setting. Contact your pediatrician or health care professional regarding the use of this medicine in children. Special care may be needed. Overdosage: If you think you have taken too much of this medicine contact a poison control center or emergency room at once. NOTE: This medicine is only for you. Do not share this medicine with others. What if I miss a dose? It is important not to miss your dose. Call your doctor or health care professional if you are unable to keep an appointment. What may interact with this medication? This medicine may interact with the following medications: bromocriptine cyclosporine certain medicines for blood pressure, heart disease, irregular heart beat certain medicines for diabetes quinidine terfenadine This list may not describe all possible interactions. Give your health care provider a list of all the medicines, herbs, non-prescription drugs, or dietary supplements you use. Also tell them if you smoke, drink alcohol, or use illegal drugs.  Some items may interact with your medicine. What should I watch for while using this medication? Tell your doctor or healthcare professional if your symptoms do not start to get better or if they get worse. Visit your doctor or health care professional for regular checks on your progress. Your condition will be monitored carefully while you are receiving this medicine. This medicine may increase blood sugar. Ask your healthcare provider if changes in diet or medicines are needed if you have diabetes. You may need blood work done while you are taking this medicine. Women should inform their doctor if they wish to become pregnant or think they might be pregnant. There is a potential for serious side effects to an unborn child. Talk to your health care professional or pharmacist for more information. Do not breast-feed an infant while taking this medicine or for 6 months after stopping it. This medicine has caused ovarian failure in some women. This medicine may interfere with the ability to have a child. Talk with your doctor or health care professional if you are concerned about your fertility. What side effects may I notice from receiving this medication? Side effects that you should report to your doctor or health care professional as soon as possible: allergic reactions like skin rash, itching or hives, swelling of the face, lips, or tongue increased blood pressure severe stomach pain signs and symptoms of hgh blood sugar such as being more thirsty or hungry or having to urinate more than normal. You may also feel very tired or have blurry vision. signs and symptoms of low blood sugar such as feeling anxious; confusion; dizziness; increased hunger; unusually weak or tired; sweating; shakiness; cold; irritable; headache; blurred vision; fast   heartbeat; loss of consciousness unusually slow heartbeat Side effects that usually do not require medical attention (report to your doctor or health care  professional if they continue or are bothersome): constipation diarrhea dizziness headache muscle pain muscle spasms nausea pain, redness, or irritation at site where injected This list may not describe all possible side effects. Call your doctor for medical advice about side effects. You may report side effects to FDA at 1-800-FDA-1088. Where should I keep my medication? This drug is given in a hospital or clinic and will not be stored at home. NOTE: This sheet is a summary. It may not cover all possible information. If you have questions about this medicine, talk to your doctor, pharmacist, or health care provider.  2022 Elsevier/Gold Standard (2018-02-27 09:13:08)  

## 2021-01-25 LAB — CHROMOGRANIN A: Chromogranin A (ng/mL): 3597 ng/mL — ABNORMAL HIGH (ref 0.0–101.8)

## 2021-02-07 ENCOUNTER — Encounter: Payer: Self-pay | Admitting: Oncology

## 2021-02-08 ENCOUNTER — Other Ambulatory Visit: Payer: Self-pay | Admitting: Nurse Practitioner

## 2021-02-08 DIAGNOSIS — C7B02 Secondary carcinoid tumors of liver: Secondary | ICD-10-CM

## 2021-02-09 ENCOUNTER — Telehealth: Payer: Self-pay | Admitting: *Deleted

## 2021-02-09 NOTE — Telephone Encounter (Signed)
Called Leah in nuclear med to schedule NETSPOT for 1st week in October. She will schedule as soon as it is authorized. Informed her to not schedule for 10/12-15. Confirmed he needs to hold the 9/20 dose of lanreotide for scan. Sent message to managed care to contact nuclear med when PA is completed. Left VM for patient with above information and that his OV will be rescheduled for October after the NETSPOT. Scheduling message sent.

## 2021-02-21 ENCOUNTER — Inpatient Hospital Stay: Payer: Federal, State, Local not specified - PPO

## 2021-02-21 ENCOUNTER — Inpatient Hospital Stay: Payer: Federal, State, Local not specified - PPO | Admitting: Oncology

## 2021-03-02 ENCOUNTER — Encounter (HOSPITAL_COMMUNITY)
Admission: RE | Admit: 2021-03-02 | Discharge: 2021-03-02 | Disposition: A | Discharge status: Home / Self Care | Payer: Federal, State, Local not specified - PPO | Source: Ambulatory Visit | Attending: Nurse Practitioner | Admitting: Nurse Practitioner

## 2021-03-02 DIAGNOSIS — Z86012 Personal history of benign carcinoid tumor: Secondary | ICD-10-CM | POA: Diagnosis not present

## 2021-03-02 DIAGNOSIS — C7A1 Malignant poorly differentiated neuroendocrine tumors: Secondary | ICD-10-CM | POA: Diagnosis not present

## 2021-03-02 DIAGNOSIS — K7689 Other specified diseases of liver: Secondary | ICD-10-CM | POA: Diagnosis not present

## 2021-03-02 DIAGNOSIS — C7B02 Secondary carcinoid tumors of liver: Secondary | ICD-10-CM | POA: Diagnosis not present

## 2021-03-02 IMAGING — CT NM PET SKULL BASE TO THIGH
7 series · 25 of 25 positions shown · non-contrast
Comparison: DOTATATE PET scan [DATE].  [DATE]

CLINICAL DATA: Restaging metastatic neuroendocrine tumor.
Persistent elevated chromogranin level equal [FF] increased from
[FF] on [DATE]. Patient status post peptide receptor
radiotherapy (Lutathera) with last treatment [DATE].

EXAM:
NUCLEAR MEDICINE PET SKULL BASE TO THIGH
TECHNIQUE: 5.3 mCi gallium 68 DOTATATE was injected intravenously. Full-ring
PET imaging was performed from the skull base to thigh after the
radiotracer. CT data was obtained and used for attenuation
correction and anatomic localization.

[Series 3: pet sk_thigh ac · axial · 5.0mm · 4.07mm/px · z∈[-598,+366]mm · 6 of 242 slices shown]
[im 1/242]
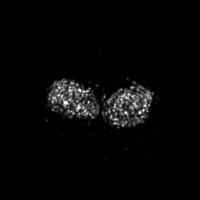
[im 49/242]
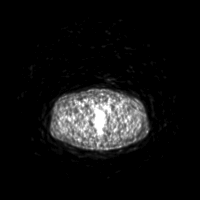
[im 97/242]
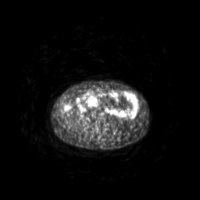
[im 145/242]
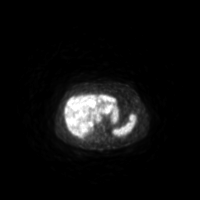
[im 193/242]
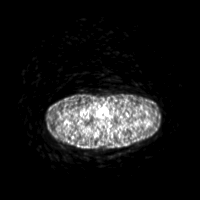
[im 242/242]
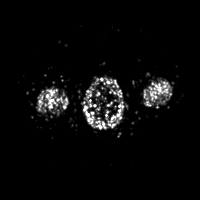

[Series 4: ct sk_thigh 5.0 bf37 · axial · 5.0mm · 0.98mm/px · z∈[-598,+366]mm · 7 of 242 slices shown]
[im 1/242]
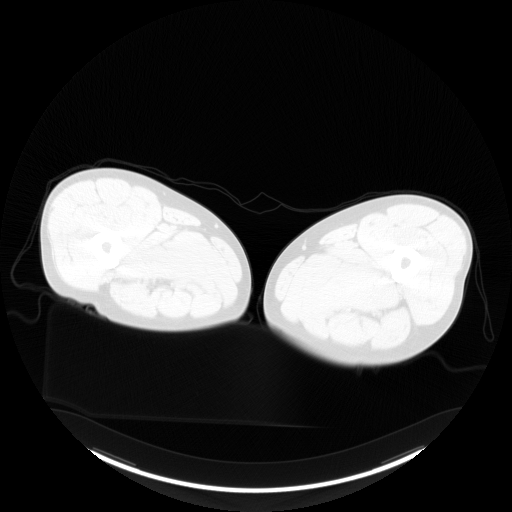
[im 41/242  brain]
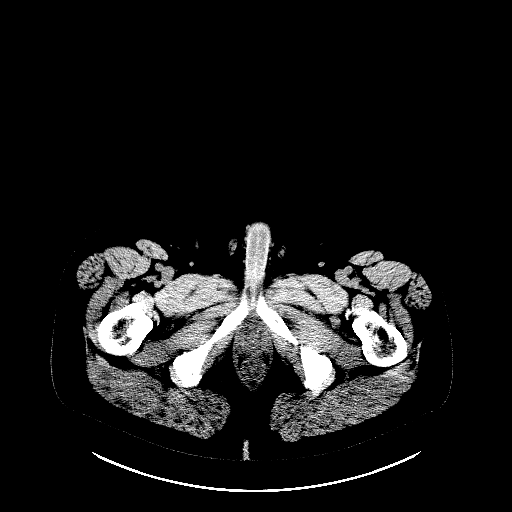
[im 81/242]
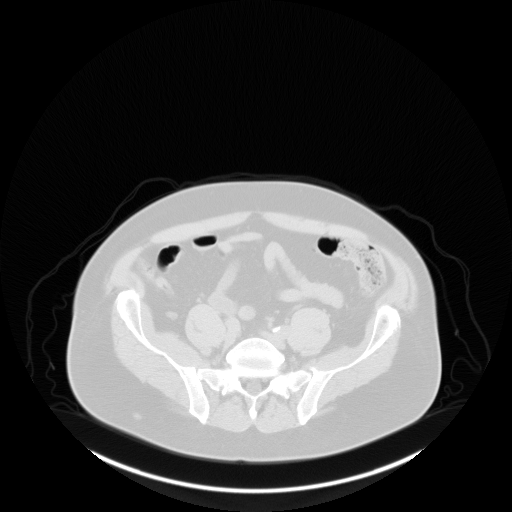
[im 121/242  brain]
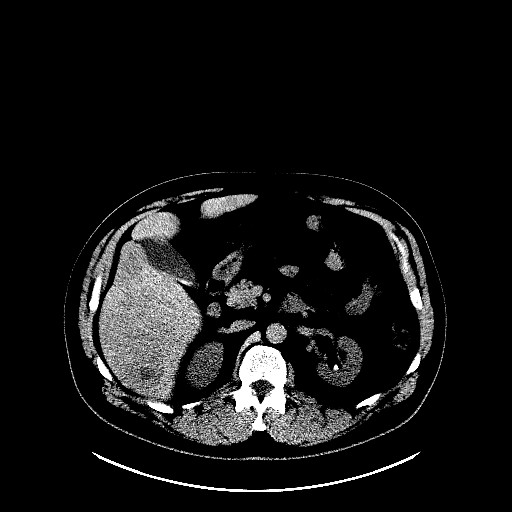
[im 161/242]
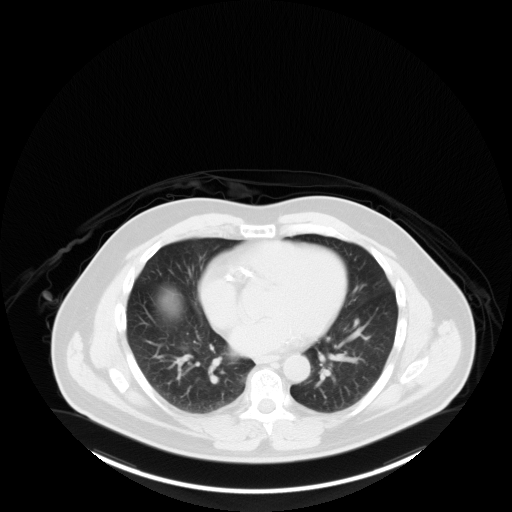
[im 201/242  brain]
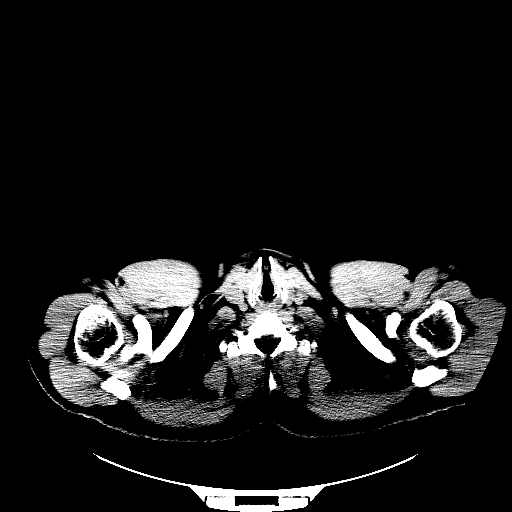
[im 242/242  brain]
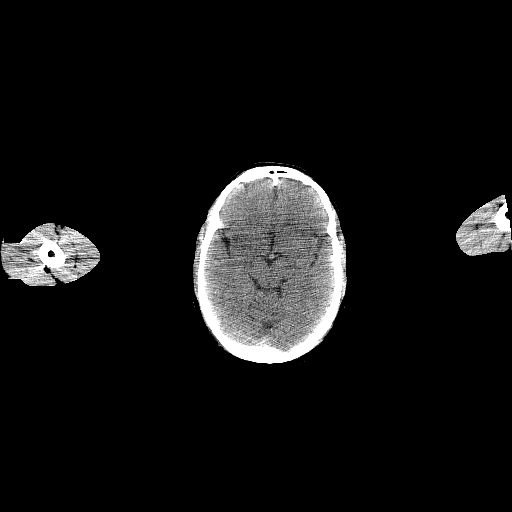

[Series 5: pet sk_thigh nac · axial · 5.0mm · 4.07mm/px · z∈[-598,+366]mm · 7 of 242 slices shown]
[im 1/242]
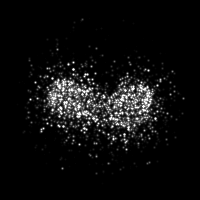
[im 41/242]
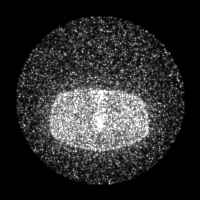
[im 81/242]
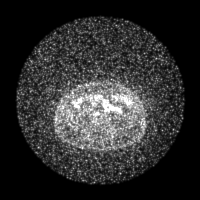
[im 121/242]
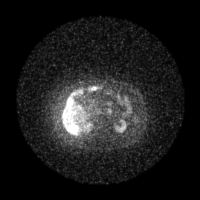
[im 161/242]
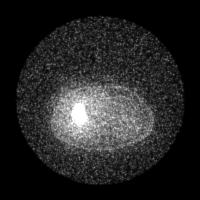
[im 201/242]
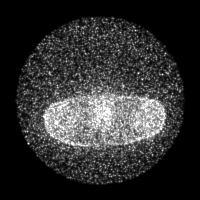
[im 242/242]
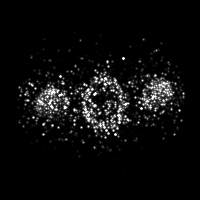

[Series 8: ct sk_thigh 5.0 br59 lung_bone · axial · 5.0mm · 0.73mm/px · z∈[-74,+198]mm · 2 of 69 slices shown]
[im 1/69  soft-tissue]
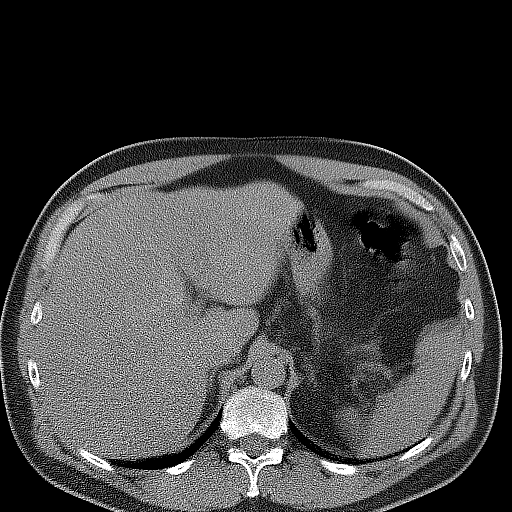
[im 69/69  brain]
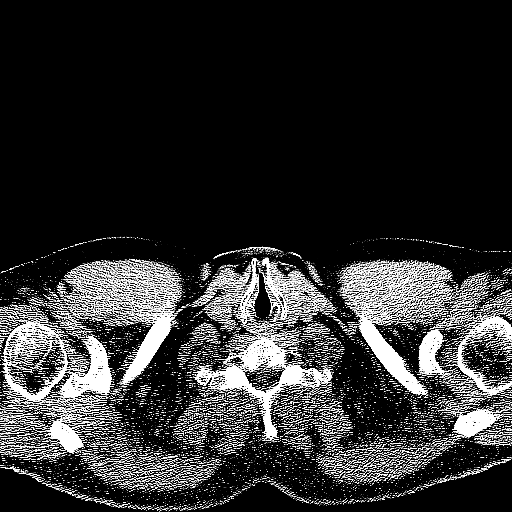

[Series 603: fused cor · 1 of 53 slices shown]
[im 1/53]
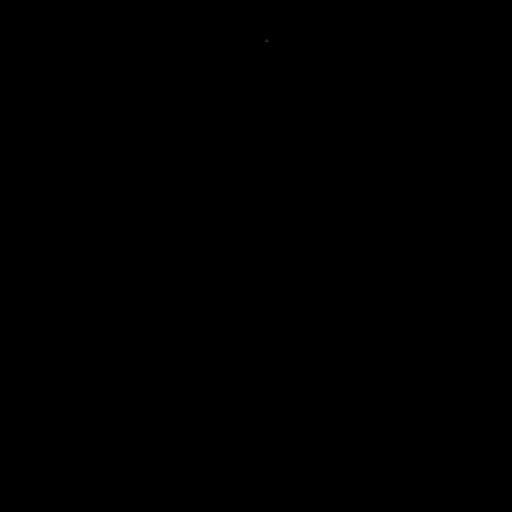

[Series 604: <mip collection> · coronal · 2.00mm/px · 1 of 32 slices shown]
[im 1/32]
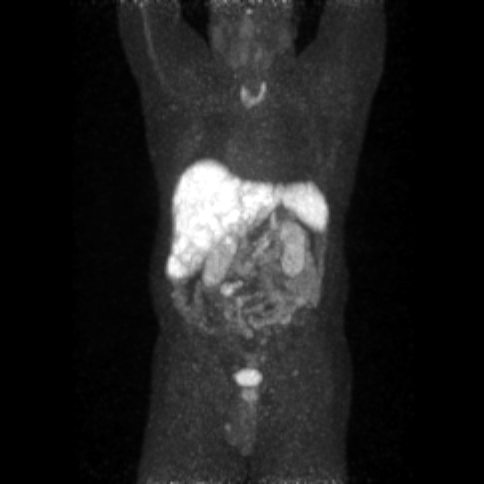

[Series 8582: results mm oncology reading · 1.0mm · 1.19mm/px · 1 of 5 slices shown]
[im 1/5]
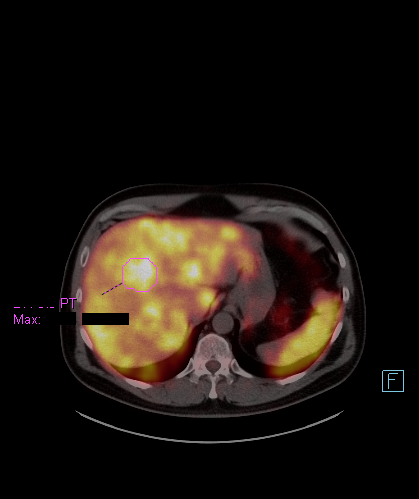

[25 of 25 positions shown; findings below may reference images not displayed]

FINDINGS: NECK

No radiotracer activity in neck lymph nodes.

Incidental CT findings: None

CHEST

No radiotracer accumulation within mediastinal or hilar lymph nodes.
No suspicious pulmonary nodules on the CT scan.

Incidental CT finding:None

ABDOMEN/PELVIS

Multiple low-attenuation lesions in liver on noncontrast CT reach
near confluence. No changes by CT imaging.

Again demonstrated multiple intensely radiotracer avid lesions which
reach near confluence LEFT RIGHT hepatic lobe. The pattern is not
changed from comparison exam.

Individual lesions are similar in radiotracer activity.

For example:

LEFT lateral hepatic lobe lesion (image 103) with SUV max equal
compared with SUV max equal 19.3.

Central LEFT hepatic lobe lesion (image 97) with SUV max equal
compared SUV max equal 20.4.

Central RIGHT hepatic lobe lesion with SUV max equal 20.4 (image
105) compared SUV max equal 19.2.

Inferior RIGHT hepatic lobe lesion SUV max equal 18.0 compares to
SUV max equal 18.9 (image 132).

A central mesenteric mass with calcification measures 28 mm x 20 mm
compared with 31 mm by 27 mm (image 147/CT series 4). This lesion
continues to have significant radiotracer activity SUV max equal
12.9 compared SUV max equal 11.8.

No new lesions are present within the mesentery. No lesion
associated with the small bowel. No lesion associated with the
pancreas.

Physiologic activity noted in the liver, spleen, adrenal glands and
kidneys.

Incidental CT findings:None

SKELETON

No focal activity to suggest skeletal metastasis.

Incidental CT findings:None
IMPRESSION: 1. Essentially stable intensely radiotracer avid confluent
neuroendocrine tumor metastasis within LEFT and RIGHT hepatic lobe.
Chromogranin A increased from [DATE].
2. Stable intense radiotracer avid central mesenteric nodal
metastasis.
3. No evidence of new metastatic neuroendocrine tumor.

## 2021-03-02 MED ORDER — GALLIUM GA 68 DOTATATE IV KIT
5.3000 | PACK | Freq: Once | INTRAVENOUS | Status: AC | PRN
  Administered 2021-03-02: 5.3 via INTRAVENOUS

## 2021-03-03 ENCOUNTER — Encounter: Payer: Self-pay | Admitting: Oncology

## 2021-03-20 ENCOUNTER — Encounter: Payer: Self-pay | Admitting: Oncology

## 2021-03-21 ENCOUNTER — Other Ambulatory Visit (HOSPITAL_BASED_OUTPATIENT_CLINIC_OR_DEPARTMENT_OTHER): Payer: Self-pay

## 2021-03-21 ENCOUNTER — Inpatient Hospital Stay (HOSPITAL_BASED_OUTPATIENT_CLINIC_OR_DEPARTMENT_OTHER): Payer: Federal, State, Local not specified - PPO | Admitting: Oncology

## 2021-03-21 ENCOUNTER — Other Ambulatory Visit: Payer: Self-pay

## 2021-03-21 ENCOUNTER — Inpatient Hospital Stay: Payer: Federal, State, Local not specified - PPO | Attending: Nurse Practitioner

## 2021-03-21 ENCOUNTER — Inpatient Hospital Stay: Payer: Federal, State, Local not specified - PPO

## 2021-03-21 ENCOUNTER — Encounter: Payer: Self-pay | Admitting: Oncology

## 2021-03-21 VITALS — BP 142/91 | HR 85 | Temp 98.1°F | Resp 20 | Ht 71.0 in | Wt 218.8 lb

## 2021-03-21 DIAGNOSIS — I10 Essential (primary) hypertension: Secondary | ICD-10-CM | POA: Diagnosis not present

## 2021-03-21 DIAGNOSIS — C7B02 Secondary carcinoid tumors of liver: Secondary | ICD-10-CM | POA: Diagnosis not present

## 2021-03-21 DIAGNOSIS — D3A098 Benign carcinoid tumors of other sites: Secondary | ICD-10-CM

## 2021-03-21 DIAGNOSIS — E039 Hypothyroidism, unspecified: Secondary | ICD-10-CM | POA: Insufficient documentation

## 2021-03-21 DIAGNOSIS — R978 Other abnormal tumor markers: Secondary | ICD-10-CM | POA: Diagnosis not present

## 2021-03-21 DIAGNOSIS — C7A098 Malignant carcinoid tumors of other sites: Secondary | ICD-10-CM | POA: Insufficient documentation

## 2021-03-21 DIAGNOSIS — Z79899 Other long term (current) drug therapy: Secondary | ICD-10-CM | POA: Insufficient documentation

## 2021-03-21 DIAGNOSIS — I252 Old myocardial infarction: Secondary | ICD-10-CM | POA: Diagnosis not present

## 2021-03-21 LAB — CMP (CANCER CENTER ONLY)
ALT: 31 U/L (ref 0–44)
AST: 26 U/L (ref 15–41)
Albumin: 4.4 g/dL (ref 3.5–5.0)
Alkaline Phosphatase: 89 U/L (ref 38–126)
Anion gap: 8 (ref 5–15)
BUN: 22 mg/dL — ABNORMAL HIGH (ref 6–20)
CO2: 25 mmol/L (ref 22–32)
Calcium: 9.9 mg/dL (ref 8.9–10.3)
Chloride: 107 mmol/L (ref 98–111)
Creatinine: 1.39 mg/dL — ABNORMAL HIGH (ref 0.61–1.24)
GFR, Estimated: 59 mL/min — ABNORMAL LOW (ref >60)
Glucose, Bld: 101 mg/dL — ABNORMAL HIGH (ref 70–99)
Potassium: 4.8 mmol/L (ref 3.5–5.1)
Sodium: 140 mmol/L (ref 135–145)
Total Bilirubin: 1.3 mg/dL — ABNORMAL HIGH (ref 0.3–1.2)
Total Protein: 7 g/dL (ref 6.5–8.1)

## 2021-03-21 LAB — CBC WITH DIFFERENTIAL (CANCER CENTER ONLY)
Abs Immature Granulocytes: 0.02 10*3/uL (ref 0.00–0.07)
Basophils Absolute: 0.1 10*3/uL (ref 0.0–0.1)
Basophils Relative: 1 %
Eosinophils Absolute: 0.4 10*3/uL (ref 0.0–0.5)
Eosinophils Relative: 8 %
HCT: 41.8 % (ref 39.0–52.0)
Hemoglobin: 14.8 g/dL (ref 13.0–17.0)
Immature Granulocytes: 0 %
Lymphocytes Relative: 21 %
Lymphs Abs: 1.2 10*3/uL (ref 0.7–4.0)
MCH: 32 pg (ref 26.0–34.0)
MCHC: 35.4 g/dL (ref 30.0–36.0)
MCV: 90.5 fL (ref 80.0–100.0)
Monocytes Absolute: 0.6 10*3/uL (ref 0.1–1.0)
Monocytes Relative: 10 %
Neutro Abs: 3.2 10*3/uL (ref 1.7–7.7)
Neutrophils Relative %: 60 %
Platelet Count: 186 10*3/uL (ref 150–400)
RBC: 4.62 MIL/uL (ref 4.22–5.81)
RDW: 12.4 % (ref 11.5–15.5)
WBC Count: 5.5 10*3/uL (ref 4.0–10.5)
nRBC: 0 % (ref 0.0–0.2)

## 2021-03-21 MED ORDER — INFLUENZA VAC SPLIT QUAD 0.5 ML IM SUSY
PREFILLED_SYRINGE | INTRAMUSCULAR | 0 refills | Status: DC
  Filled 2021-03-21: qty 0.5, 1d supply, fill #0

## 2021-03-21 MED ORDER — LANREOTIDE ACETATE 120 MG/0.5ML ~~LOC~~ SOLN
120.0000 mg | Freq: Once | SUBCUTANEOUS | Status: AC
  Administered 2021-03-21: 120 mg via SUBCUTANEOUS

## 2021-03-21 NOTE — Progress Notes (Signed)
Labs

## 2021-03-21 NOTE — Progress Notes (Signed)
[facility] OFFICE PROGRESS NOTE   Diagnosis: Carcinoid tumor  INTERVAL HISTORY:   William Hancock returns as scheduled.  He continues monthly lanreotide.  He has flushing when he drinks alcohol.  No fever, night sweats, or diarrhea.  Good energy level.  No new complaint.  Objective:  Vital signs in last 24 hours:  Blood pressure (!) 142/91, pulse 85, temperature 98.1 F (36.7 C), temperature source Oral, resp. rate 20, height $RemoveBe'5\' 11"'ffEFAApcA$  (1.803 m), weight 218 lb 12.8 oz (99.2 kg), SpO2 100 %.    Resp: Lungs clear bilaterally Cardio: Regular rate and rhythm GI: The liver edge is palpable a few fingers below the right costal margin, no splenomegaly, nontender Vascular: No leg edema  Lab Results:  Lab Results  Component Value Date   WBC 5.5 03/21/2021   HGB 14.8 03/21/2021   HCT 41.8 03/21/2021   MCV 90.5 03/21/2021   PLT 186 03/21/2021   NEUTROABS 3.2 03/21/2021    CMP  Lab Results  Component Value Date   NA 140 03/21/2021   K 4.8 03/21/2021   CL 107 03/21/2021   CO2 25 03/21/2021   GLUCOSE 101 (H) 03/21/2021   BUN 22 (H) 03/21/2021   CREATININE 1.39 (H) 03/21/2021   CALCIUM 9.9 03/21/2021   PROT 7.0 03/21/2021   ALBUMIN 4.4 03/21/2021   AST 26 03/21/2021   ALT 31 03/21/2021   ALKPHOS 89 03/21/2021   BILITOT 1.3 (H) 03/21/2021   GFRNONAA 59 (L) 03/21/2021   GFRAA >60 02/26/2020    No results found for: CEA1, CEA, [id number], CA125  Lab Results  Component Value Date   INR 1.0 11/13/2018   LABPROT 13.0 11/13/2018    Imaging:  No results found.  Medications: I have reviewed the patient's current medications.   Assessment/Plan: Metastatic neuroendocrine tumor, well-differentiated 10/31/2018 Abdominal ultrasound-numerous nearly confluent heterogeneous masses measuring up to 3.5 cm.   11/04/2018 CT abdomen/pelvis-multiple enhancing hepatic lesions and a central partially calcified spiculated mesenteric mass measuring 3.1 x 3.1 cm.   11/13/2018 biopsy  liver lesion-metastatic well-differentiated neuroendocrine tumor positive for CKAE1-AE3, synaptophysin, chromogranin, CD56 and CDX-2; Ki-67 labeling index less than 3%; TTF-1 negative Netspot 12/12/2018- increased activity associated a lobular mesenteric mass, small bowel medial to the mesenteric mass, and multiple liver lesions Elevated chromogranin A level and 24-hour urine 5 HIAA Monthly lanreotide beginning 12/23/2018 CT 05/13/2019-multiple large hepatic metastases on the dotatate are poorly visualized, several small lesions are decreased in size 1 new lesion, stable mesenteric mass Netspot 09/04/2019-increase in size of left and right liver lesions, stable to slightly decreased radiotracer accumulation, stable central mesenteric mass, no new metastatic lesions Lutathera 10/13/2019 Lutathera 12/04/2019 Lutathera 01/29/2020 Lutathera 03/25/2020 Restaging Netspot 04/22/2020-overall positive measurable response to therapy.  Multiple hepatic metastasis continue to demonstrate intense radiotracer activity, the activity is consistently decreased from preradiotherapy scan.  No evidence of progressive disease in the liver.  Central mesenteric metastasis and adjacent small bowel lesion have very similar metabolic activity and no change in size.  No evidence of new peritoneal metastasis or skeletal metastasis. Monthly lanreotide continued Netspot 03/02/2021-stable radiotracer avid confluent left and right liver lesions, stable central mesenteric nodal metastasis, no new sites of metastatic disease Monthly lanreotide continued History of MI/stent placement CAD Hypertension Renal insufficiency Hypothyroidism diagnosed with markedly elevated TSH and low T4 on 12/18/2018 Acute left elbow/right ankle pain and swelling August 2021        Disposition: William Hancock appears unchanged.  He is generally asymptomatic from the metastatic  neuroendocrine tumor.  I reviewed the Netspot findings and images with him.  The  Netspot is consistent with stable disease.  The chromogranin a was slightly higher last month, but has not changed significantly over the past year.  We discussed treatment options.  The plan is to continue monthly lanreotide.  We will follow the chromogranin a level and plan for a repeat Netspot in 6-8 months.  William Coder, MD  03/21/2021  9:08 AM

## 2021-03-21 NOTE — Patient Instructions (Signed)
William Hancock  Discharge Instructions: Thank you for choosing [facility] to provide your oncology and hematology care.   If you have a lab appointment with the [facility], please go directly to the [facility] and check in at the registration area.   Wear comfortable clothing and clothing appropriate for easy access to any Portacath or PICC line.   We strive to give you quality time with your provider. You may need to reschedule your appointment if you arrive late (15 or more minutes).  Arriving late affects you and other patients whose appointments are after yours.  Also, if you miss three or more appointments without notifying the office, you may be dismissed from the clinic at the provider's discretion.      For prescription refill requests, have your pharmacy contact our office and allow 72 hours for refills to be completed.    Today you received the following chemotherapy and/or immunotherapy agents lanreotide       To help prevent nausea and vomiting after your treatment, we encourage you to take your nausea medication as directed.  BELOW ARE SYMPTOMS THAT SHOULD BE REPORTED IMMEDIATELY: *FEVER GREATER THAN 100.4 F (38 C) OR HIGHER *CHILLS OR SWEATING *NAUSEA AND VOMITING THAT IS NOT CONTROLLED WITH YOUR NAUSEA MEDICATION *UNUSUAL SHORTNESS OF BREATH *UNUSUAL BRUISING OR BLEEDING *URINARY PROBLEMS (pain or burning when urinating, or frequent urination) *BOWEL PROBLEMS (unusual diarrhea, constipation, pain near the anus) TENDERNESS IN MOUTH AND THROAT WITH OR WITHOUT PRESENCE OF ULCERS (sore throat, sores in mouth, or a toothache) UNUSUAL RASH, SWELLING OR PAIN  UNUSUAL VAGINAL DISCHARGE OR ITCHING   Items with * indicate a potential emergency and should be followed up as soon as possible or go to the Emergency Department if any problems should occur.  Please show the CHEMOTHERAPY ALERT CARD or IMMUNOTHERAPY ALERT CARD at check-in to  the Emergency Department and triage nurse.  Should you have questions after your visit or need to cancel or reschedule your appointment, please contact [facility]  Dept: [phone]  and follow the prompts.  Office hours are 8:00 a.m. to 4:30 p.m. Monday - Friday. Please note that voicemails left after 4:00 p.m. may not be returned until the following business day.  We are closed weekends and major holidays. You have access to a nurse at all times for urgent questions. Please call the main number to the clinic Dept: [phone] and follow the prompts.   For any non-urgent questions, you may also contact your provider using MyChart. We now offer e-Visits for anyone 47 and older to request care online for non-urgent symptoms. For details visit [email or web address].   Also download the MyChart app! Go to the app store, search "MyChart", open the app, select [facility], and log in with your MyChart username and password.  Due to Covid, a mask is required upon entering the hospital/clinic. If you do not have a mask, one will be given to you upon arrival. For doctor visits, patients may have 1 support person aged 65 or older with them. For treatment visits, patients cannot have anyone with them due to current Covid guidelines and our immunocompromised population.   Lanreotide injection What is this medication? LANREOTIDE (lan REE oh tide) is used to reduce blood levels of growth hormone in patients with a condition called acromegaly. It also works to slow or stop tumor growth in patients with neuroendocrine tumors and treat carcinoid syndrome. This medicine may  be used for other purposes; ask your health care provider or pharmacist if you have questions. COMMON BRAND NAME(S): Somatuline Depot What should I tell my care team before I take this medication? They need to know if you have any of these conditions: diabetes gallbladder disease heart disease kidney  disease liver disease thyroid disease an unusual or allergic reaction to lanreotide, other medicines, foods, dyes, or preservatives pregnant or trying to get pregnant breast-feeding How should I use this medication? This medicine is for injection under the skin. It is given by a health care professional in a hospital or clinic setting. Contact your pediatrician or health care professional regarding the use of this medicine in children. Special care may be needed. Overdosage: If you think you have taken too much of this medicine contact a poison control center or emergency room at once. NOTE: This medicine is only for you. Do not share this medicine with others. What if I miss a dose? It is important not to miss your dose. Call your doctor or health care professional if you are unable to keep an appointment. What may interact with this medication? This medicine may interact with the following medications: bromocriptine cyclosporine certain medicines for blood pressure, heart disease, irregular heart beat certain medicines for diabetes quinidine terfenadine This list may not describe all possible interactions. Give your health care provider a list of all the medicines, herbs, non-prescription drugs, or dietary supplements you use. Also tell them if you smoke, drink alcohol, or use illegal drugs. Some items may interact with your medicine. What should I watch for while using this medication? Tell your doctor or healthcare professional if your symptoms do not start to get better or if they get worse. Visit your doctor or health care professional for regular checks on your progress. Your condition will be monitored carefully while you are receiving this medicine. This medicine may increase blood sugar. Ask your healthcare provider if changes in diet or medicines are needed if you have diabetes. You may need blood work done while you are taking this medicine. Women should inform their doctor if  they wish to become pregnant or think they might be pregnant. There is a potential for serious side effects to an unborn child. Talk to your health care professional or pharmacist for more information. Do not breast-feed an infant while taking this medicine or for 6 months after stopping it. This medicine has caused ovarian failure in some women. This medicine may interfere with the ability to have a child. Talk with your doctor or health care professional if you are concerned about your fertility. What side effects may I notice from receiving this medication? Side effects that you should report to your doctor or health care professional as soon as possible: allergic reactions like skin rash, itching or hives, swelling of the face, lips, or tongue increased blood pressure severe stomach pain signs and symptoms of hgh blood sugar such as being more thirsty or hungry or having to urinate more than normal. You may also feel very tired or have blurry vision. signs and symptoms of low blood sugar such as feeling anxious; confusion; dizziness; increased hunger; unusually weak or tired; sweating; shakiness; cold; irritable; headache; blurred vision; fast heartbeat; loss of consciousness unusually slow heartbeat Side effects that usually do not require medical attention (report to your doctor or health care professional if they continue or are bothersome): constipation diarrhea dizziness headache muscle pain muscle spasms nausea pain, redness, or irritation at site where injected  This list may not describe all possible side effects. Call your doctor for medical advice about side effects. You may report side effects to FDA at 1-800-FDA-1088. Where should I keep my medication? This drug is given in a hospital or clinic and will not be stored at home. NOTE: This sheet is a summary. It may not cover all possible information. If you have questions about this medicine, talk to your doctor, pharmacist, or  health care provider.  2022 Elsevier/Gold Standard (2018-02-27 09:13:08)

## 2021-03-23 LAB — CHROMOGRANIN A: Chromogranin A (ng/mL): 3463 ng/mL — ABNORMAL HIGH (ref 0.0–101.8)

## 2021-03-31 DIAGNOSIS — E7800 Pure hypercholesterolemia, unspecified: Secondary | ICD-10-CM | POA: Diagnosis not present

## 2021-03-31 DIAGNOSIS — I10 Essential (primary) hypertension: Secondary | ICD-10-CM | POA: Diagnosis not present

## 2021-03-31 DIAGNOSIS — Z0000 Encounter for general adult medical examination without abnormal findings: Secondary | ICD-10-CM | POA: Diagnosis not present

## 2021-04-18 ENCOUNTER — Other Ambulatory Visit: Payer: Self-pay

## 2021-04-18 ENCOUNTER — Inpatient Hospital Stay: Payer: Federal, State, Local not specified - PPO | Attending: Nurse Practitioner

## 2021-04-18 VITALS — BP 136/92 | HR 56 | Resp 20

## 2021-04-18 DIAGNOSIS — C7B02 Secondary carcinoid tumors of liver: Secondary | ICD-10-CM | POA: Insufficient documentation

## 2021-04-18 DIAGNOSIS — C7A098 Malignant carcinoid tumors of other sites: Secondary | ICD-10-CM | POA: Diagnosis not present

## 2021-04-18 DIAGNOSIS — D3A098 Benign carcinoid tumors of other sites: Secondary | ICD-10-CM

## 2021-04-18 MED ORDER — LANREOTIDE ACETATE 120 MG/0.5ML ~~LOC~~ SOLN
120.0000 mg | Freq: Once | SUBCUTANEOUS | Status: AC
  Administered 2021-04-18: 120 mg via SUBCUTANEOUS

## 2021-04-18 NOTE — Progress Notes (Signed)
Lanreotide injection in right buttock without difficulty.

## 2021-04-18 NOTE — Patient Instructions (Signed)
[facility]  Discharge Instructions: Thank you for choosing [facility] to provide your oncology and hematology care.   If you have a lab appointment with the [facility], please go directly to the [facility] and check in at the registration area.   Wear comfortable clothing and clothing appropriate for easy access to any Portacath or PICC line.   We strive to give you quality time with your provider. You may need to reschedule your appointment if you arrive late (15 or more minutes).  Arriving late affects you and other patients whose appointments are after yours.  Also, if you miss three or more appointments without notifying the office, you may be dismissed from the clinic at the provider's discretion.      For prescription refill requests, have your pharmacy contact our office and allow 72 hours for refills to be completed.    Today you received the following chemotherapy and/or immunotherapy agents Lanreotide      To help prevent nausea and vomiting after your treatment, we encourage you to take your nausea medication as directed.  BELOW ARE SYMPTOMS THAT SHOULD BE REPORTED IMMEDIATELY: *FEVER GREATER THAN 100.4 F (38 C) OR HIGHER *CHILLS OR SWEATING *NAUSEA AND VOMITING THAT IS NOT CONTROLLED WITH YOUR NAUSEA MEDICATION *UNUSUAL SHORTNESS OF BREATH *UNUSUAL BRUISING OR BLEEDING *URINARY PROBLEMS (pain or burning when urinating, or frequent urination) *BOWEL PROBLEMS (unusual diarrhea, constipation, pain near the anus) TENDERNESS IN MOUTH AND THROAT WITH OR WITHOUT PRESENCE OF ULCERS (sore throat, sores in mouth, or a toothache) UNUSUAL RASH, SWELLING OR PAIN  UNUSUAL VAGINAL DISCHARGE OR ITCHING   Items with * indicate a potential emergency and should be followed up as soon as possible or go to the Emergency Department if any problems should occur.  Please show the CHEMOTHERAPY ALERT CARD or IMMUNOTHERAPY ALERT CARD at check-in to the  Emergency Department and triage nurse.  Should you have questions after your visit or need to cancel or reschedule your appointment, please contact [facility]  Dept: [phone]  and follow the prompts.  Office hours are 8:00 a.m. to 4:30 p.m. Monday - Friday. Please note that voicemails left after 4:00 p.m. may not be returned until the following business day.  We are closed weekends and major holidays. You have access to a nurse at all times for urgent questions. Please call the main number to the clinic Dept: [phone] and follow the prompts.   For any non-urgent questions, you may also contact your provider using MyChart. We now offer e-Visits for anyone 46 and older to request care online for non-urgent symptoms. For details visit [email or web address].   Also download the MyChart app! Go to the app store, search "MyChart", open the app, select [facility], and log in with your MyChart username and password.  Due to Covid, a mask is required upon entering the hospital/clinic. If you do not have a mask, one will be given to you upon arrival. For doctor visits, patients may have 1 support person aged 76 or older with them. For treatment visits, patients cannot have anyone with them due to current Covid guidelines and our immunocompromised population.   Lanreotide injection What is this medication? LANREOTIDE (lan REE oh tide) is used to reduce blood levels of growth hormone in patients with a condition called acromegaly. It also works to slow or stop tumor growth in patients with neuroendocrine tumors and treat carcinoid syndrome. This medicine may be  used for other purposes; ask your health care provider or pharmacist if you have questions. COMMON BRAND NAME(S): Somatuline Depot What should I tell my care team before I take this medication? They need to know if you have any of these conditions: diabetes gallbladder disease heart disease kidney disease liver  disease thyroid disease an unusual or allergic reaction to lanreotide, other medicines, foods, dyes, or preservatives pregnant or trying to get pregnant breast-feeding How should I use this medication? This medicine is for injection under the skin. It is given by a health care professional in a hospital or clinic setting. Contact your pediatrician or health care professional regarding the use of this medicine in children. Special care may be needed. Overdosage: If you think you have taken too much of this medicine contact a poison control center or emergency room at once. NOTE: This medicine is only for you. Do not share this medicine with others. What if I miss a dose? It is important not to miss your dose. Call your doctor or health care professional if you are unable to keep an appointment. What may interact with this medication? This medicine may interact with the following medications: bromocriptine cyclosporine certain medicines for blood pressure, heart disease, irregular heart beat certain medicines for diabetes quinidine terfenadine This list may not describe all possible interactions. Give your health care provider a list of all the medicines, herbs, non-prescription drugs, or dietary supplements you use. Also tell them if you smoke, drink alcohol, or use illegal drugs. Some items may interact with your medicine. What should I watch for while using this medication? Tell your doctor or healthcare professional if your symptoms do not start to get better or if they get worse. Visit your doctor or health care professional for regular checks on your progress. Your condition will be monitored carefully while you are receiving this medicine. This medicine may increase blood sugar. Ask your healthcare provider if changes in diet or medicines are needed if you have diabetes. You may need blood work done while you are taking this medicine. Women should inform their doctor if they wish to  become pregnant or think they might be pregnant. There is a potential for serious side effects to an unborn child. Talk to your health care professional or pharmacist for more information. Do not breast-feed an infant while taking this medicine or for 6 months after stopping it. This medicine has caused ovarian failure in some women. This medicine may interfere with the ability to have a child. Talk with your doctor or health care professional if you are concerned about your fertility. What side effects may I notice from receiving this medication? Side effects that you should report to your doctor or health care professional as soon as possible: allergic reactions like skin rash, itching or hives, swelling of the face, lips, or tongue increased blood pressure severe stomach pain signs and symptoms of hgh blood sugar such as being more thirsty or hungry or having to urinate more than normal. You may also feel very tired or have blurry vision. signs and symptoms of low blood sugar such as feeling anxious; confusion; dizziness; increased hunger; unusually weak or tired; sweating; shakiness; cold; irritable; headache; blurred vision; fast heartbeat; loss of consciousness unusually slow heartbeat Side effects that usually do not require medical attention (report to your doctor or health care professional if they continue or are bothersome): constipation diarrhea dizziness headache muscle pain muscle spasms nausea pain, redness, or irritation at site where injected This  list may not describe all possible side effects. Call your doctor for medical advice about side effects. You may report side effects to FDA at 1-800-FDA-1088. Where should I keep my medication? This drug is given in a hospital or clinic and will not be stored at home. NOTE: This sheet is a summary. It may not cover all possible information. If you have questions about this medicine, talk to your doctor, pharmacist, or health care  provider.  2022 Elsevier/Gold Standard (2018-04-09 00:00:00)

## 2021-04-21 ENCOUNTER — Encounter: Payer: Self-pay | Admitting: Oncology

## 2021-04-24 ENCOUNTER — Telehealth: Payer: Self-pay

## 2021-04-25 ENCOUNTER — Encounter: Payer: Self-pay | Admitting: Oncology

## 2021-04-25 NOTE — Telephone Encounter (Signed)
Pt requested 24 hour urine test. Dr Benay Spice approved. Pt informed he can come to [facility] to pick up container for urine collection.

## 2021-05-01 ENCOUNTER — Other Ambulatory Visit: Payer: Self-pay

## 2021-05-01 DIAGNOSIS — D3A098 Benign carcinoid tumors of other sites: Secondary | ICD-10-CM

## 2021-05-05 DIAGNOSIS — I2510 Atherosclerotic heart disease of native coronary artery without angina pectoris: Secondary | ICD-10-CM | POA: Diagnosis not present

## 2021-05-05 DIAGNOSIS — I252 Old myocardial infarction: Secondary | ICD-10-CM | POA: Insufficient documentation

## 2021-05-05 DIAGNOSIS — C7A098 Malignant carcinoid tumors of other sites: Secondary | ICD-10-CM | POA: Insufficient documentation

## 2021-05-05 DIAGNOSIS — C7B02 Secondary carcinoid tumors of liver: Secondary | ICD-10-CM | POA: Diagnosis not present

## 2021-05-05 DIAGNOSIS — R197 Diarrhea, unspecified: Secondary | ICD-10-CM | POA: Diagnosis not present

## 2021-05-05 DIAGNOSIS — I10 Essential (primary) hypertension: Secondary | ICD-10-CM | POA: Insufficient documentation

## 2021-05-05 DIAGNOSIS — E039 Hypothyroidism, unspecified: Secondary | ICD-10-CM | POA: Insufficient documentation

## 2021-05-05 DIAGNOSIS — N289 Disorder of kidney and ureter, unspecified: Secondary | ICD-10-CM | POA: Diagnosis not present

## 2021-05-05 DIAGNOSIS — Z79899 Other long term (current) drug therapy: Secondary | ICD-10-CM | POA: Insufficient documentation

## 2021-05-09 DIAGNOSIS — E039 Hypothyroidism, unspecified: Secondary | ICD-10-CM | POA: Diagnosis not present

## 2021-05-11 ENCOUNTER — Telehealth: Payer: Self-pay

## 2021-05-11 NOTE — Telephone Encounter (Signed)
My chart message from Pt inquiring about 24 hour urine results. Called [facility] to inquire about urine sample spoke with [place], who stated Pt's urine was sent out to the lab. [place] called the lab who stated that the test had to be reset and results are expected on 05/15/21

## 2021-05-13 LAB — 5 HIAA, QUANTITATIVE, URINE, 24 HOUR
5-HIAA, Ur: 160.5 mg/L
5-HIAA,Quant.,24 Hr Urine: 240.8 mg/24 hr — ABNORMAL HIGH (ref 0.0–14.9)
Total Volume: 1500

## 2021-05-16 ENCOUNTER — Telehealth: Payer: Self-pay | Admitting: Nurse Practitioner

## 2021-05-16 ENCOUNTER — Telehealth: Payer: Self-pay | Admitting: *Deleted

## 2021-05-16 ENCOUNTER — Inpatient Hospital Stay: Payer: Federal, State, Local not specified - PPO | Attending: Nurse Practitioner

## 2021-05-16 ENCOUNTER — Other Ambulatory Visit: Payer: Self-pay

## 2021-05-16 ENCOUNTER — Encounter: Payer: Self-pay | Admitting: Nurse Practitioner

## 2021-05-16 VITALS — BP 145/89 | HR 63 | Temp 97.7°F | Resp 20 | Ht 71.0 in | Wt 222.1 lb

## 2021-05-16 DIAGNOSIS — I10 Essential (primary) hypertension: Secondary | ICD-10-CM | POA: Diagnosis not present

## 2021-05-16 DIAGNOSIS — I252 Old myocardial infarction: Secondary | ICD-10-CM | POA: Diagnosis not present

## 2021-05-16 DIAGNOSIS — C7A098 Malignant carcinoid tumors of other sites: Secondary | ICD-10-CM | POA: Diagnosis not present

## 2021-05-16 DIAGNOSIS — R197 Diarrhea, unspecified: Secondary | ICD-10-CM | POA: Diagnosis not present

## 2021-05-16 DIAGNOSIS — I2510 Atherosclerotic heart disease of native coronary artery without angina pectoris: Secondary | ICD-10-CM | POA: Diagnosis not present

## 2021-05-16 DIAGNOSIS — Z79899 Other long term (current) drug therapy: Secondary | ICD-10-CM | POA: Diagnosis not present

## 2021-05-16 DIAGNOSIS — C7B02 Secondary carcinoid tumors of liver: Secondary | ICD-10-CM | POA: Diagnosis not present

## 2021-05-16 DIAGNOSIS — N289 Disorder of kidney and ureter, unspecified: Secondary | ICD-10-CM | POA: Diagnosis not present

## 2021-05-16 DIAGNOSIS — E039 Hypothyroidism, unspecified: Secondary | ICD-10-CM | POA: Diagnosis not present

## 2021-05-16 DIAGNOSIS — D3A098 Benign carcinoid tumors of other sites: Secondary | ICD-10-CM

## 2021-05-16 MED ORDER — LANREOTIDE ACETATE 120 MG/0.5ML ~~LOC~~ SOLN
120.0000 mg | Freq: Once | SUBCUTANEOUS | Status: AC
  Administered 2021-05-16: 120 mg via SUBCUTANEOUS

## 2021-05-16 NOTE — Telephone Encounter (Signed)
I spoke with [place], nurse practitioner with [facility]. [facility] surgical oncologist in [place].  William Hancock is being considered for resection of the primary tumor as well as surgical debulking of the liver metastases.  They are requesting we obtain an MRI of the abdomen with and without contrast.  Order placed today.

## 2021-05-16 NOTE — Patient Instructions (Signed)
Lanreotide injection °What is this medication? °LANREOTIDE (lan REE oh tide) is used to reduce blood levels of growth hormone in patients with a condition called acromegaly. It also works to slow or stop tumor growth in patients with neuroendocrine tumors and treat carcinoid syndrome. °This medicine may be used for other purposes; ask your health care provider or pharmacist if you have questions. °COMMON BRAND NAME(S): Somatuline Depot °What should I tell my care team before I take this medication? °They need to know if you have any of these conditions: °diabetes °gallbladder disease °heart disease °kidney disease °liver disease °thyroid disease °an unusual or allergic reaction to lanreotide, other medicines, foods, dyes, or preservatives °pregnant or trying to get pregnant °breast-feeding °How should I use this medication? °This medicine is for injection under the skin. It is given by a health care professional in a hospital or clinic setting. °Contact your pediatrician or health care professional regarding the use of this medicine in children. Special care may be needed. °Overdosage: If you think you have taken too much of this medicine contact a poison control center or emergency room at once. °NOTE: This medicine is only for you. Do not share this medicine with others. °What if I miss a dose? °It is important not to miss your dose. Call your doctor or health care professional if you are unable to keep an appointment. °What may interact with this medication? °This medicine may interact with the following medications: °bromocriptine °cyclosporine °certain medicines for blood pressure, heart disease, irregular heart beat °certain medicines for diabetes °quinidine °terfenadine °This list may not describe all possible interactions. Give your health care provider a list of all the medicines, herbs, non-prescription drugs, or dietary supplements you use. Also tell them if you smoke, drink alcohol, or use illegal drugs.  Some items may interact with your medicine. °What should I watch for while using this medication? °Tell your doctor or healthcare professional if your symptoms do not start to get better or if they get worse. °Visit your doctor or health care professional for regular checks on your progress. Your condition will be monitored carefully while you are receiving this medicine. °This medicine may increase blood sugar. Ask your healthcare provider if changes in diet or medicines are needed if you have diabetes. °You may need blood work done while you are taking this medicine. °Women should inform their doctor if they wish to become pregnant or think they might be pregnant. There is a potential for serious side effects to an unborn child. Talk to your health care professional or pharmacist for more information. Do not breast-feed an infant while taking this medicine or for 6 months after stopping it. °This medicine has caused ovarian failure in some women. This medicine may interfere with the ability to have a child. Talk with your doctor or health care professional if you are concerned about your fertility. °What side effects may I notice from receiving this medication? °Side effects that you should report to your doctor or health care professional as soon as possible: °allergic reactions like skin rash, itching or hives, swelling of the face, lips, or tongue °increased blood pressure °severe stomach pain °signs and symptoms of hgh blood sugar such as being more thirsty or hungry or having to urinate more than normal. You may also feel very tired or have blurry vision. °signs and symptoms of low blood sugar such as feeling anxious; confusion; dizziness; increased hunger; unusually weak or tired; sweating; shakiness; cold; irritable; headache; blurred vision; fast   heartbeat; loss of consciousness °unusually slow heartbeat °Side effects that usually do not require medical attention (report to your doctor or health care  professional if they continue or are bothersome): °constipation °diarrhea °dizziness °headache °muscle pain °muscle spasms °nausea °pain, redness, or irritation at site where injected °This list may not describe all possible side effects. Call your doctor for medical advice about side effects. You may report side effects to FDA at 1-800-FDA-1088. °Where should I keep my medication? °This drug is given in a hospital or clinic and will not be stored at home. °NOTE: This sheet is a summary. It may not cover all possible information. If you have questions about this medicine, talk to your doctor, pharmacist, or health care provider. °© 2022 Elsevier/Gold Standard (2018-04-09 00:00:00) ° °

## 2021-05-16 NOTE — Telephone Encounter (Signed)
Called patient w/MRI appointment at [facility] on 12/19 at 9 pm with arrival at 8:30 pm NPO 4 hours prior. Check in at desk in emergency room and tell them he is here for a scan. He understands and agrees to appointment.

## 2021-05-17 ENCOUNTER — Telehealth: Payer: Self-pay

## 2021-05-17 NOTE — Telephone Encounter (Signed)
Tc to the Pt to let him know that his MRI order in. Pt is aware of the order

## 2021-05-22 ENCOUNTER — Encounter: Payer: Self-pay | Admitting: Cardiology

## 2021-05-22 ENCOUNTER — Ambulatory Visit (HOSPITAL_COMMUNITY)
Admission: RE | Admit: 2021-05-22 | Discharge: 2021-05-22 | Disposition: A | Discharge status: Home / Self Care | Payer: Federal, State, Local not specified - PPO | Source: Ambulatory Visit | Attending: Nurse Practitioner | Admitting: Nurse Practitioner

## 2021-05-22 ENCOUNTER — Other Ambulatory Visit: Payer: Self-pay

## 2021-05-22 ENCOUNTER — Ambulatory Visit: Payer: Federal, State, Local not specified - PPO | Admitting: Cardiology

## 2021-05-22 VITALS — BP 150/96 | HR 64 | Temp 98.0°F | Resp 16 | Ht 71.0 in | Wt 221.0 lb

## 2021-05-22 DIAGNOSIS — K8020 Calculus of gallbladder without cholecystitis without obstruction: Secondary | ICD-10-CM | POA: Diagnosis not present

## 2021-05-22 DIAGNOSIS — C787 Secondary malignant neoplasm of liver and intrahepatic bile duct: Secondary | ICD-10-CM | POA: Diagnosis not present

## 2021-05-22 DIAGNOSIS — C7A8 Other malignant neuroendocrine tumors: Secondary | ICD-10-CM | POA: Diagnosis not present

## 2021-05-22 DIAGNOSIS — I2510 Atherosclerotic heart disease of native coronary artery without angina pectoris: Secondary | ICD-10-CM | POA: Diagnosis not present

## 2021-05-22 DIAGNOSIS — I10 Essential (primary) hypertension: Secondary | ICD-10-CM | POA: Diagnosis not present

## 2021-05-22 DIAGNOSIS — C7B02 Secondary carcinoid tumors of liver: Secondary | ICD-10-CM | POA: Diagnosis not present

## 2021-05-22 DIAGNOSIS — C7B8 Other secondary neuroendocrine tumors: Secondary | ICD-10-CM

## 2021-05-22 DIAGNOSIS — K7689 Other specified diseases of liver: Secondary | ICD-10-CM | POA: Diagnosis not present

## 2021-05-22 IMAGING — MR MR ABDOMEN WO/W CM
18 series · 47 of 48 positions shown · IV contrast (10 GADAVIST)
Comparison: PET-CT, [DATE]

CLINICAL DATA: Carcinoid metastatic to liver, status post [FU]
Dotatate radiotherapy

EXAM:
MRI ABDOMEN WITHOUT AND WITH CONTRAST
TECHNIQUE: Multiplanar multisequence MR imaging of the abdomen was performed
both before and after the administration of intravenous contrast.
CONTRAST:  10mL GADAVIST GADOBUTROL 1 MMOL/ML IV SOLN

[Series 3: DWI · axial · 6.0mm · 1.57mm/px · z∈[-58,+223]mm · 4 of 80 slices shown (1 of 2)]
[im 1/80]
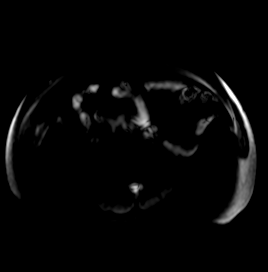
[im 27/80]
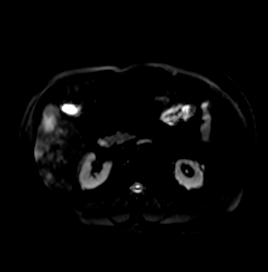
[im 53/80]
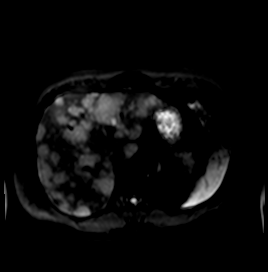
[im 80/80]
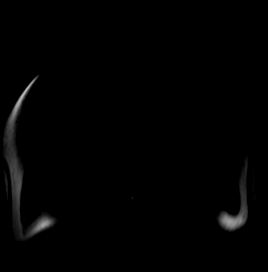

[Series 4: DWI · axial · 6.0mm · 1.57mm/px · z∈[-58,+223]mm · 2 of 40 slices shown (2 of 2)]
[im 1/40]
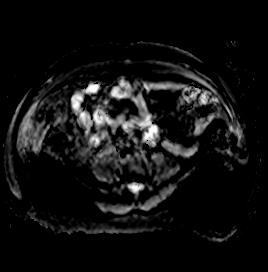
[im 40/40]
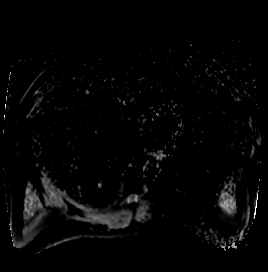

[Series 6: T2 fat-sat · axial · 6.0mm · 1.31mm/px · z∈[-59,+221]mm · 2 of 40 slices shown]
[im 1/40]
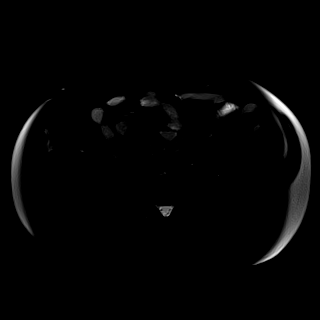
[im 40/40]
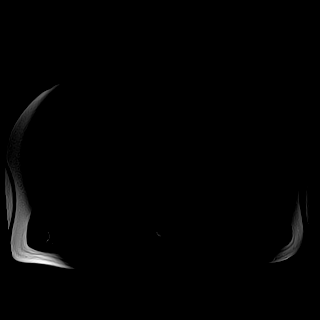

[Series 7: T2 · coronal · 6.0mm · 1.64mm/px · 1 of 36 slices shown (1 of 2)]
[im 1/36]
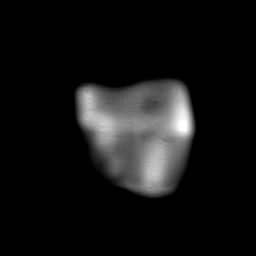

[Series 8: T1 · axial · 3.5mm · 1.31mm/px · z∈[-69,+236]mm · 3 of 88 slices shown (1 of 2)]
[im 1/88]
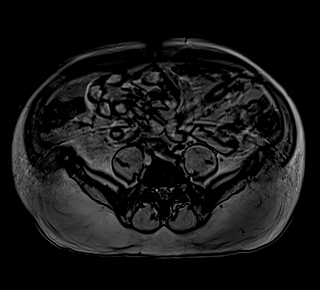
[im 44/88]
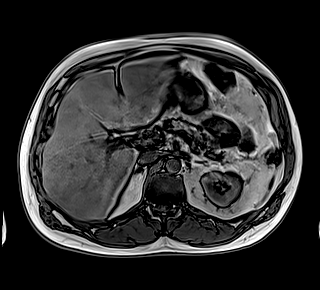
[im 88/88]
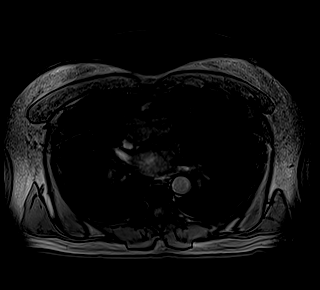

[Series 9: T1 · axial · 3.5mm · 1.31mm/px · z∈[-69,+236]mm · 3 of 88 slices shown (2 of 2)]
[im 1/88]
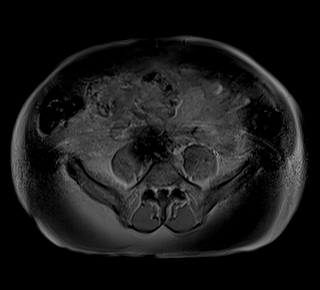
[im 44/88]
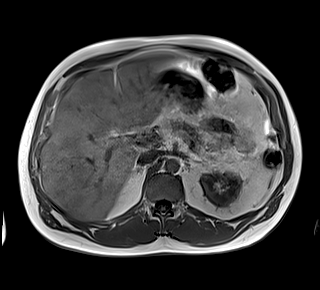
[im 88/88]
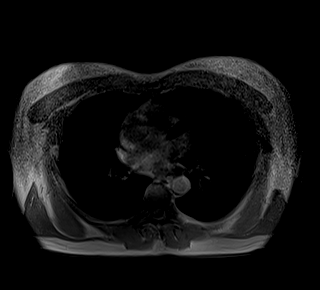

[Series 10: bSSFP · axial · 4.0mm · 0.84mm/px · z∈[-43,+233]mm · 2 of 70 slices shown]
[im 1/70]
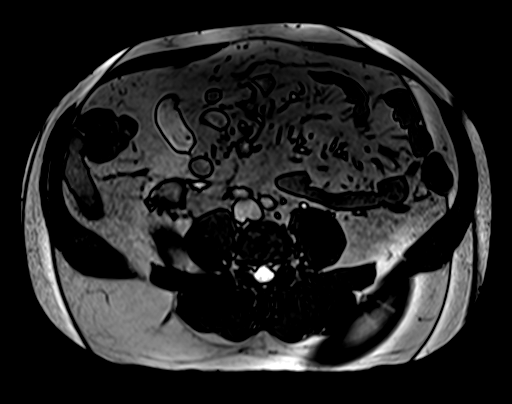
[im 70/70]
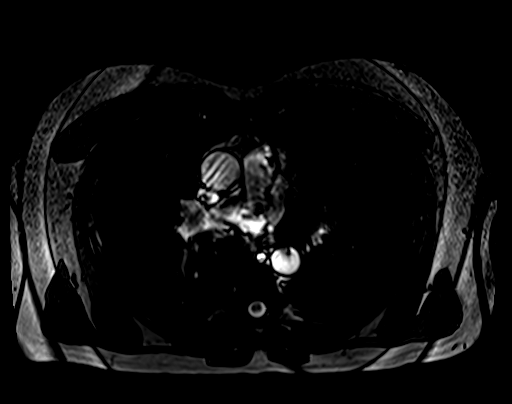

[Series 12: T1 dynamic · axial · 3.0mm · 1.34mm/px · z∈[-52,+233]mm · 3 of 96 slices shown (1 of 10)]
[im 1/96]
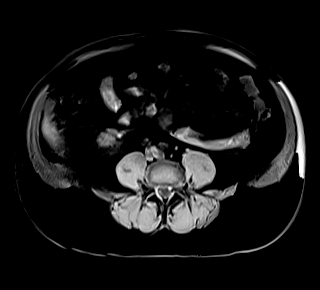
[im 48/96]
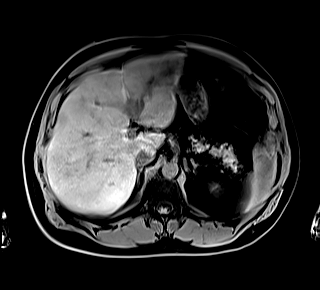
[im 96/96]
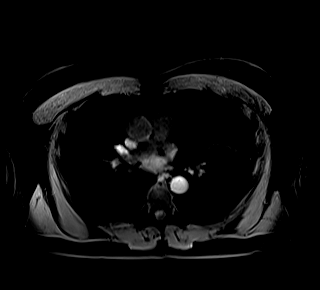

[Series 16: T1 dynamic · axial · 3.0mm · 1.34mm/px · z∈[-52,+233]mm · 3 of 96 slices shown (2 of 10)]
[im 1/96]
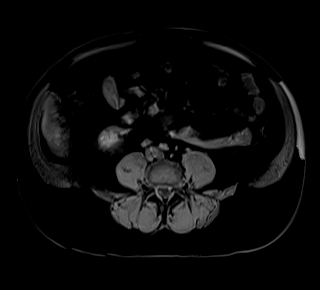
[im 48/96]
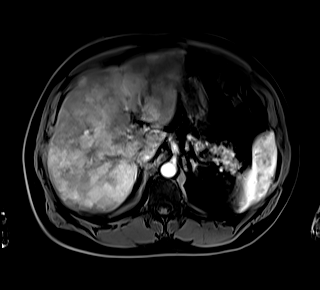
[im 96/96]
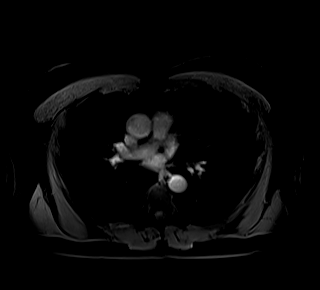

[Series 17: T1 dynamic · axial · 3.0mm · 1.34mm/px · z∈[-52,+233]mm · 3 of 96 slices shown (3 of 10)]
[im 1/96]
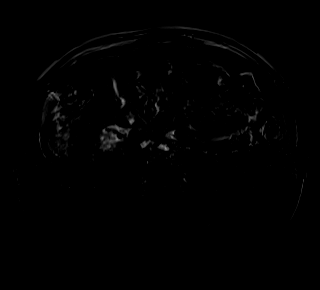
[im 48/96]
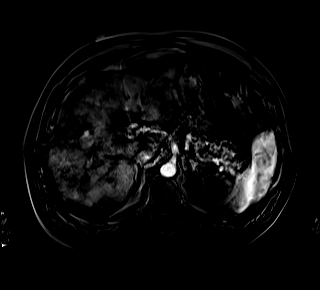
[im 96/96]
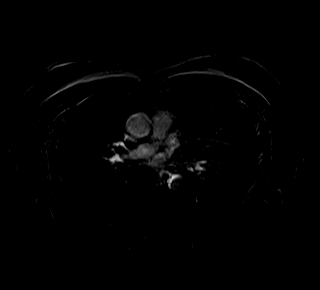

[Series 20: T1 dynamic · axial · 3.0mm · 1.34mm/px · z∈[-52,+233]mm · 3 of 96 slices shown (4 of 10)]
[im 1/96]
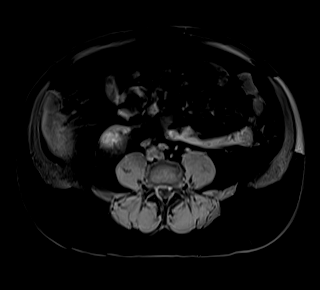
[im 48/96]
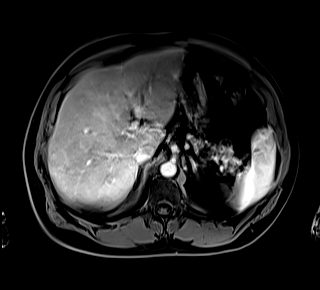
[im 96/96]
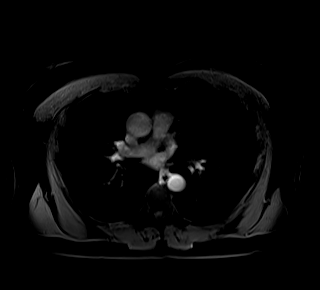

[Series 21: T1 dynamic · axial · 3.0mm · 1.34mm/px · z∈[-52,+233]mm · 3 of 96 slices shown (5 of 10)]
[im 1/96]
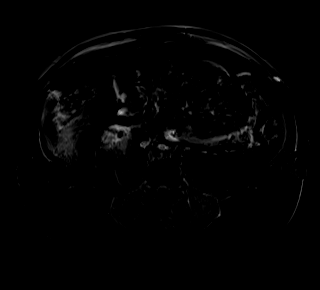
[im 48/96]
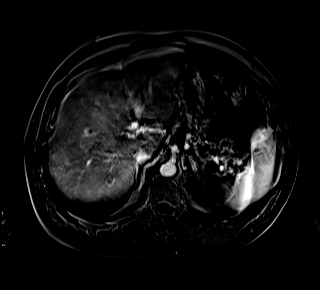
[im 96/96]
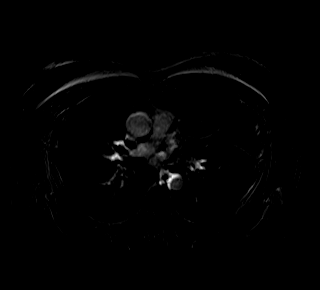

[Series 24: T1 dynamic · axial · 3.0mm · 1.34mm/px · z∈[-52,+233]mm · 3 of 96 slices shown (6 of 10)]
[im 1/96]
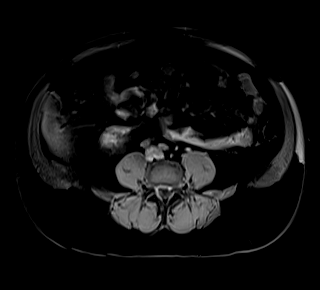
[im 48/96]
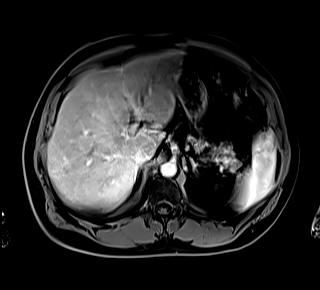
[im 96/96]
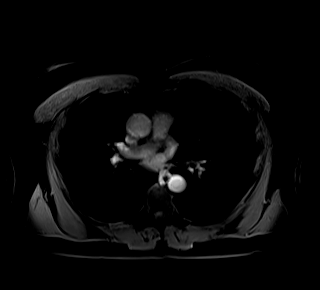

[Series 25: T1 dynamic · axial · 3.0mm · 1.34mm/px · z∈[-52,+233]mm · 3 of 96 slices shown (7 of 10)]
[im 1/96]
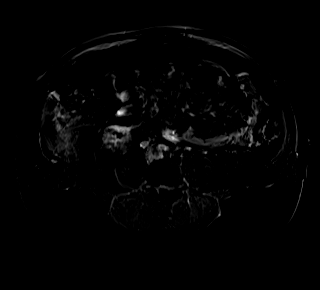
[im 48/96]
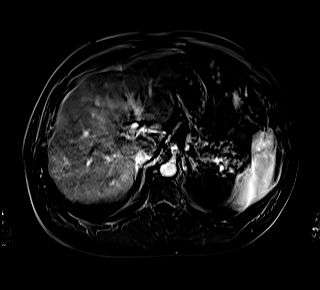
[im 96/96]
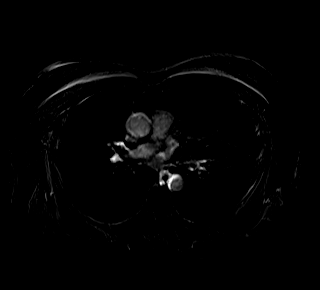

[Series 27: T1 dynamic · coronal · 3.5mm · 1.41mm/px · 3 of 88 slices shown (8 of 10)]
[im 1/88]
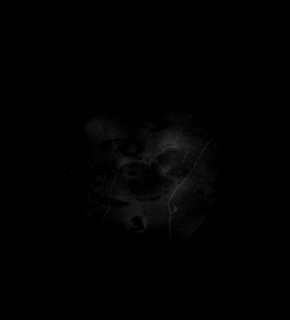
[im 44/88]
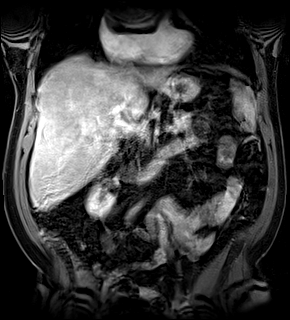
[im 88/88]
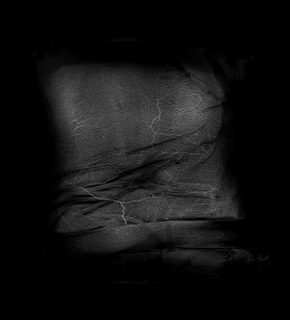

[Series 28: T2 · axial · 6.0mm · 1.68mm/px · 1 of 42 slices shown (2 of 2)]
[im 1/42]
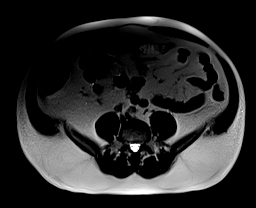

[Series 31: T1 dynamic · axial · 3.0mm · 1.34mm/px · z∈[-52,+233]mm · 3 of 96 slices shown (9 of 10)]
[im 1/96]
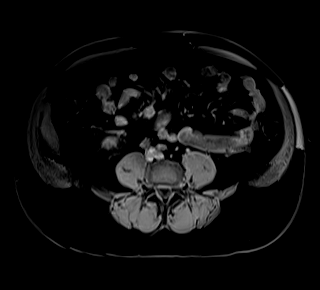
[im 48/96]
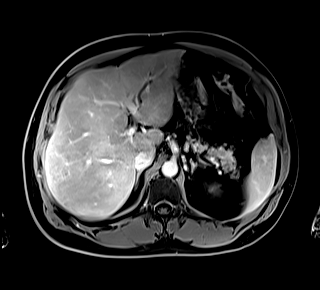
[im 96/96]
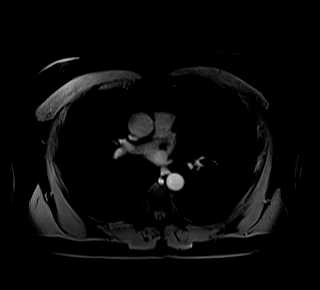

[Series 32: T1 dynamic · axial · 3.0mm · 1.34mm/px · z∈[-52,+89]mm · 2 of 96 slices shown (10 of 10)]
[im 1/96]
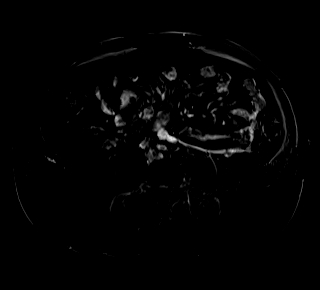
[im 48/96]
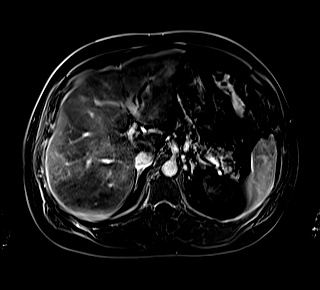

[47 of 48 positions shown; findings below may reference images not displayed]

FINDINGS: Lower chest: No acute findings.

Hepatobiliary: Innumerable bulky, confluent contrast enhancing liver
lesions are again seen. Although these are somewhat difficult to
precisely compare to prior noncontrast PET-CT examination, an index
lesion in the anterior right lobe of the liver, hepatic segment V
does not appear significantly changed, measuring 4.6 x 4.3 cm
(series 16, image 67). An additional index lesion in the inferior
posterior right lobe of the liver, hepatic segment VI, does not
appear significantly changed measuring 3.5 x 3.4 cm (series 16,
image 77). Multiple gallstones in the dependent gallbladder. No
biliary ductal dilatation.

Pancreas: No mass, inflammatory changes, or other parenchymal
abnormality identified.

Spleen:  Within normal limits in size and appearance.

Adrenals/Urinary Tract: No masses identified. No evidence of
hydronephrosis.

Stomach/Bowel: Mesenteric metastasis in the central small bowel
mesentery is only partially imaged on this examination and not
significantly changed, measuring 2.7 x 2.5 cm (series 6, image 40,
series 7, image 15). Visualized portions within the abdomen are
otherwise unremarkable.

Vascular/Lymphatic: No pathologically enlarged lymph nodes
identified. No abdominal aortic aneurysm demonstrated.

Other:  None.

Musculoskeletal: No suspicious bone lesions identified.
IMPRESSION: 1. Innumerable bulky, confluent contrast enhancing liver lesions are
again seen. Although these are somewhat difficult to precisely
compare to prior noncontrast PET-CT examination, index lesions do
not appear significantly changed.
2. Mesenteric metastasis in the central small bowel mesentery is
only partially imaged on this examination and not significantly
changed to the extent imaged.
3. Cholelithiasis.

## 2021-05-22 MED ORDER — GADOBUTROL 1 MMOL/ML IV SOLN
10.0000 mL | Freq: Once | INTRAVENOUS | Status: AC | PRN
  Administered 2021-05-22: 22:00:00 10 mL via INTRAVENOUS

## 2021-05-22 NOTE — Progress Notes (Signed)
Subjective:   William Hancock, male    DOB: 09-24-1965, 55 y.o.   MRN: [id number]   Chief complaint:  Coronary artery disease   HPI  55 year old Caucasian male with hypertension, hyperlipidemia, coronary artery disease, s/p multivessel complex PCI (pro & mid RCA, prox-mid LAD/Diag bifurcation) for NSTEMI in 06/2018, metastatic neuroendocrine tumor  Patient is scheduled for surgery in the beginning of 2023-surgical try reduction of liver mets and removal of primary neuroendocrine tumor.  This will be performed in [place] by Dr. Elie Confer.   Patient is doing well from cardiac standpoint, denies any chest pain or shortness of breath symptoms.  He is currently walking about a mile every day.  Prior to temperatures getting cold, he was walking 2 miles every day without any symptoms.  Current Outpatient Medications on File Prior to Visit  Medication Sig Dispense Refill   amLODipine (NORVASC) 5 MG tablet Take 1 tablet (5 mg total) by mouth daily. 90 tablet 3   aspirin 81 MG chewable tablet Chew 1 tablet (81 mg total) by mouth daily. 90 tablet 3   atorvastatin (LIPITOR) 40 MG tablet Take 1 tablet (40 mg total) by mouth daily. 90 tablet 3   influenza vac split quadrivalent PF (FLUARIX) 0.5 ML injection Inject into the muscle. 0.5 mL 0   levothyroxine (SYNTHROID) 125 MCG tablet Take 125 mcg by mouth daily.     metoprolol succinate (TOPROL-XL) 25 MG 24 hr tablet Take 1 tablet (25 mg total) by mouth in the morning and at bedtime. 180 tablet 3   nitroGLYCERIN (NITROSTAT) 0.4 MG SL tablet Place 1 tablet (0.4 mg total) under the tongue every 5 (five) minutes x 3 doses as needed for chest pain. 25 tablet 1   vitamin B-12 (CYANOCOBALAMIN) 500 MCG tablet Take 1,000 mcg by mouth daily.     No current facility-administered medications on file prior to visit.    Cardiovascular studies:  EKG 05/22/2021: Sinus rhythm 63 bpm  Normal EKG  Coronary intervention 06/09/2018: LM: Normal  LAD: Prox  to mid LAD/Diag1 bifurcation severe calcific 80% stenosis        Diag 1 mid 75% stenosis        Atherectomy and prox LAD into Diag         Synergy DES 2.75 X 16 mm DES        Overlapping Stent into prox LAD Synergy DES 3.0 X 38 mm        Provisional stent mid LAD with minicrush technique Synergy DES 2.75 X 16 mm DES Ramus: 50 % proximal disease LCx: Mild luminal irregularities RCA: Stenting performed 06/06/2018        Severe mid 99% stenosis--->Orsero 3.5 X 26 mm Orsero DES         Post dilatation with 4.0 mm Simpson balloon        Severe prox 80% stenosis--->Orsero 3.5 X 9 mm DES        Post dilatation with 4.0 mm Granger balloon   Recommendation: DAPT with Aspirin and brilinta for 1 year Aggressive risk factor modification  Hospital echocardiogram 06/06/2018: - Left ventricle: The cavity size was normal. There was mild   concentric hypertrophy. Systolic function was normal. The   estimated ejection fraction was in the range of 55% to 60%. Wall   motion was normal; there were no regional wall motion   abnormalities. Doppler parameters are consistent with abnormal   left ventricular relaxation (grade 1 diastolic dysfunction). - No significant valvular abnormality.  Recent labs: 03/21/2021: Glucose 101, BUN/Cr 22/1.39. EGFR 59. Na/K 140/4.8. T.bili 1.3. Rest of the CMP normal H/H 14/41. MCV 90. Platelets 186  04/22/2020: Glucose 148, BUN/Cr 18/1.54. EGFR 53. Na/K 143/4.6. Rest of the CMP normal H/H 14/42. MCV 90. Platelets 153  01/01/2020: Glucose 99, BUN/Cr 18/1.6. EGFR 47. Na/K 143/4.6.  Chol 82, TG 72, HDL 37, LDL 29 Lipoprotein a 9 normal  06/09/2019: Glucose 86, BUN/Cr 19/1.41. EGFR 56. Na/K 141/4.8. Rest of the CMP normal H/H 14/43. MCV 92. Platelets 154 TSH 2.6 normal  12/18/2018: Chol 104, TG 63, HDL 49, LDL 42  Review of Systems  Review of Systems  Cardiovascular:  Negative for chest pain, dyspnea on exertion, leg swelling, palpitations and syncope.        Vitals:    05/22/21 1351 05/22/21 1359  BP: (!) 163/98 (!) 150/96  Pulse: 60 64  Resp: 16   Temp: 98 F (36.7 C)   SpO2: 96%      Objective:   Physical Exam  Physical Exam Vitals and nursing note reviewed.  Constitutional:      General: He is not in acute distress.    Appearance: He is well-developed.  Neck:     Vascular: No JVD.  Cardiovascular:     Rate and Rhythm: Normal rate and regular rhythm.     Pulses: Intact distal pulses.     Heart sounds: Normal heart sounds. No murmur heard. Pulmonary:     Effort: Pulmonary effort is normal.     Breath sounds: Normal breath sounds. No wheezing or rales.           Assessment & Recommendations:   55 year old Caucasian male with hypertension, hyperlipidemia, coronary artery disease, s/p multivessel complex PCI (pro & mid RCA, prox-mid LAD/Diag bifurcation) for NSTEMI in 06/2018, metastatic neuroendocrine tumor  CAD without angina: S/p multilvessel PCI for NSTEMI 06/2018 Doing well without angina symptoms. EKG today is unremarkable. Will check preop echocardiogram, especially in the setting of his neuroendocrine tumor. Continue aspirin 81 mg daily.  Okay to stop Brilinta. Okay to hold aspirin 5 days before his upcoming NET surgery. Continue metoprolol succinate, which he prefers taking 25 mg twice daily.   Continue lipitor 40 mg daily. Will check lipid panel in 11/2021. Increase amlodipine to 1.5 tabs of 5 mg daily.  This can be further increased to up to 10 mg daily, subject to his tolerance and blood pressure control.    Hypertension: As above  Metastatic neuroendocrine tumor: Managed by Dr. Benay Spice.  Upcoming surgery in [place].  Blood pressure check visit with echocardiogram in 1-2 weeks.  Otherwise, follow-up with me in 11/2021.  Nigel Mormon, MD [facility] Pager: [phone] Office: [phone] If no answer Cell [phone]

## 2021-05-25 ENCOUNTER — Other Ambulatory Visit: Payer: Self-pay

## 2021-05-25 ENCOUNTER — Inpatient Hospital Stay (HOSPITAL_BASED_OUTPATIENT_CLINIC_OR_DEPARTMENT_OTHER): Payer: Federal, State, Local not specified - PPO | Admitting: Oncology

## 2021-05-25 ENCOUNTER — Encounter: Payer: Self-pay | Admitting: Oncology

## 2021-05-25 VITALS — BP 154/88 | HR 66 | Temp 97.8°F | Resp 18 | Ht 71.0 in | Wt 221.4 lb

## 2021-05-25 DIAGNOSIS — E039 Hypothyroidism, unspecified: Secondary | ICD-10-CM | POA: Diagnosis not present

## 2021-05-25 DIAGNOSIS — N289 Disorder of kidney and ureter, unspecified: Secondary | ICD-10-CM | POA: Diagnosis not present

## 2021-05-25 DIAGNOSIS — Z79899 Other long term (current) drug therapy: Secondary | ICD-10-CM | POA: Diagnosis not present

## 2021-05-25 DIAGNOSIS — C7A098 Malignant carcinoid tumors of other sites: Secondary | ICD-10-CM | POA: Diagnosis not present

## 2021-05-25 DIAGNOSIS — R197 Diarrhea, unspecified: Secondary | ICD-10-CM | POA: Diagnosis not present

## 2021-05-25 DIAGNOSIS — I10 Essential (primary) hypertension: Secondary | ICD-10-CM | POA: Diagnosis not present

## 2021-05-25 DIAGNOSIS — C7B02 Secondary carcinoid tumors of liver: Secondary | ICD-10-CM | POA: Diagnosis not present

## 2021-05-25 DIAGNOSIS — I2510 Atherosclerotic heart disease of native coronary artery without angina pectoris: Secondary | ICD-10-CM | POA: Diagnosis not present

## 2021-05-25 DIAGNOSIS — I252 Old myocardial infarction: Secondary | ICD-10-CM | POA: Diagnosis not present

## 2021-05-25 NOTE — Progress Notes (Signed)
[facility] OFFICE PROGRESS NOTE   Diagnosis: Carcinoid tumor  INTERVAL HISTORY:   [name] returns as scheduled.  He feels well.  He has flushing only after drinking alcohol.  He has an occasional episode of diarrhea after eating certain food.  No other complaint. He saw Dr.Hobbs-Maluccio in [place] on 05/12/2021.  She recommended a MRI and then consider debulking of the liver disease.  Objective:  Vital signs in last 24 hours:  Blood pressure (!) 154/88, pulse 66, temperature 97.8 F (36.6 C), temperature source Oral, resp. rate 18, height $RemoveBe'5\' 11"'MIysccQdt$  (1.803 m), weight 221 lb 6.4 oz (100.4 kg), SpO2 100 %.    Resp: Lungs clear bilaterally Cardio: Regular rate and rhythm GI: The liver edge is palpable in the right upper abdomen, nontender Vascular: No leg edema   Lab Results:  Lab Results  Component Value Date   WBC 5.5 03/21/2021   HGB 14.8 03/21/2021   HCT 41.8 03/21/2021   MCV 90.5 03/21/2021   PLT 186 03/21/2021   NEUTROABS 3.2 03/21/2021    CMP  Lab Results  Component Value Date   NA 140 03/21/2021   K 4.8 03/21/2021   CL 107 03/21/2021   CO2 25 03/21/2021   GLUCOSE 101 (H) 03/21/2021   BUN 22 (H) 03/21/2021   CREATININE 1.39 (H) 03/21/2021   CALCIUM 9.9 03/21/2021   PROT 7.0 03/21/2021   ALBUMIN 4.4 03/21/2021   AST 26 03/21/2021   ALT 31 03/21/2021   ALKPHOS 89 03/21/2021   BILITOT 1.3 (H) 03/21/2021   GFRNONAA 59 (L) 03/21/2021   GFRAA >60 02/26/2020    No results found for: CEA1, CEA, CAN199, CA125  Lab Results  Component Value Date   INR 1.0 11/13/2018   LABPROT 13.0 11/13/2018    Imaging:  MR Abdomen W Wo Contrast  Result Date: 05/23/2021 CLINICAL DATA:  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 CONTRAST:   1mL GADAVIST GADOBUTROL 1 MMOL/ML IV SOLN COMPARISON:  PET-CT, 03/02/2021 FINDINGS: Lower chest: No acute findings. Hepatobiliary: Innumerable bulky, confluent contrast enhancing liver lesions are again seen. Although these are somewhat difficult to precisely compare to prior noncontrast PET-CT examination, an index lesion in the anterior right lobe of the liver, hepatic segment V does not appear significantly changed, measuring 4.6 x 4.3 cm (series 16, image 67). An additional index lesion in the inferior posterior right lobe of the liver, hepatic segment VI, does not appear significantly changed measuring 3.5 x 3.4 cm (series 16, image 77). Multiple gallstones in the dependent gallbladder. No biliary ductal dilatation. Pancreas: No mass, inflammatory changes, or other parenchymal abnormality identified. Spleen:  Within normal limits in size and appearance. Adrenals/Urinary Tract: No masses identified. No evidence of hydronephrosis. 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 Visualized portions within the abdomen are otherwise unremarkable. Vascular/Lymphatic: No pathologically enlarged lymph nodes identified. No abdominal aortic aneurysm demonstrated. Other:  None. Musculoskeletal: No suspicious bone lesions identified. IMPRESSION: 1. Innumerable bulky, confluent contrast enhancing liver lesions are again seen. Although these are somewhat difficult to precisely compare to prior noncontrast PET-CT examination, index lesions do not appear significantly changed. 2. 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 3. Cholelithiasis. Electronically Signed   By: Delanna Ahmadi M.D.   On: 05/23/2021 10:40    Medications: I have reviewed the  patient's current medications.   Assessment/Plan: Metastatic neuroendocrine tumor, well-differentiated 10/31/2018 Abdominal ultrasound-numerous nearly confluent heterogeneous masses measuring up to 3.5 cm.   11/04/2018 CT abdomen/pelvis-multiple enhancing hepatic lesions and a central partially calcified spiculated mesenteric mass measuring 3.1 x 3.1 cm.   11/13/2018 biopsy liver lesion-metastatic well-differentiated neuroendocrine tumor positive for CKAE1-AE3, synaptophysin, chromogranin, CD56 and CDX-2; Ki-67 labeling index less than 3%; TTF-1 negative Netspot 12/12/2018- increased activity associated a lobular mesenteric mass, small bowel medial to the mesenteric mass, and multiple liver lesions Elevated chromogranin A level and 24-hour urine 5 HIAA Monthly lanreotide beginning 12/23/2018 CT 05/13/2019-multiple large hepatic metastases on the dotatate are poorly visualized, several small lesions are decreased in size 1 new lesion, stable mesenteric mass Netspot 09/04/2019-increase in size of left and right liver lesions, stable to slightly decreased radiotracer accumulation, stable central mesenteric mass, no new metastatic lesions Lutathera 10/13/2019 Lutathera 12/04/2019 Lutathera 01/29/2020 Lutathera 03/25/2020 Restaging Netspot 04/22/2020-overall positive measurable response to therapy.  Multiple hepatic metastasis continue to demonstrate intense radiotracer activity, the activity is consistently decreased from preradiotherapy scan.  No evidence of progressive disease in the liver.  Central mesenteric metastasis and adjacent small bowel lesion have very similar metabolic activity and no change in size.  No evidence of new peritoneal metastasis or skeletal metastasis. Monthly lanreotide continued Netspot 03/02/2021-stable radiotracer avid confluent left and right liver lesions, stable central mesenteric nodal metastasis, no new sites of metastatic disease Monthly lanreotide continued MRI abdomen  05/22/2021-innumerable confluent contrast-enhancing liver lesions-not significantly changed compared to the PET/CT, stable mesenteric metastasis History of MI/stent placement CAD Hypertension Renal insufficiency Hypothyroidism diagnosed with markedly elevated TSH and low T4 on 12/18/2018 Acute left elbow/right ankle pain and swelling August 2021        Disposition: Mr. Gapinski appears stable.  He continues monthly lanreotide.  I discussed the MRI findings and reviewed the images.  He has extensive metastatic disease involving the right and left liver. We discussed treatment options.  He plans to follow-up with Dr.Hobbs-Maluccio to consider surgical debulking of the liver lesions.  I explained it may be difficult to perform a significant cytoreductive procedure given the extent of tumor involving the liver. I discussed the case with Dr. Leamon Arnt.  He agrees Mr. Tomasello has received standard treatment to date.  We can arrange for a second surgical opinion at [facility] if Mr Senn desires.  I will communicate with Dr. Hortense Ramal.  Mr. Battaglini will return as scheduled 06/13/2021.  Betsy Coder, MD  05/25/2021  8:39 AM

## 2021-05-30 ENCOUNTER — Ambulatory Visit: Payer: Federal, State, Local not specified - PPO | Admitting: Student

## 2021-05-30 ENCOUNTER — Encounter: Payer: Self-pay | Admitting: *Deleted

## 2021-05-30 ENCOUNTER — Other Ambulatory Visit: Payer: Self-pay | Admitting: *Deleted

## 2021-05-30 ENCOUNTER — Ambulatory Visit: Payer: Federal, State, Local not specified - PPO

## 2021-05-30 ENCOUNTER — Other Ambulatory Visit: Payer: Self-pay

## 2021-05-30 VITALS — BP 132/82 | HR 67

## 2021-05-30 DIAGNOSIS — I10 Essential (primary) hypertension: Secondary | ICD-10-CM | POA: Diagnosis not present

## 2021-05-30 DIAGNOSIS — I2510 Atherosclerotic heart disease of native coronary artery without angina pectoris: Secondary | ICD-10-CM | POA: Diagnosis not present

## 2021-05-30 DIAGNOSIS — C7B02 Secondary carcinoid tumors of liver: Secondary | ICD-10-CM

## 2021-05-30 NOTE — Progress Notes (Signed)
Patient's blood pressure is now well controlled with amlodipine 1.5 tablets daily.  Essential hypertension   Alethia Berthold, PA-C 05/30/2021, 10:07 AM Office: [phone]

## 2021-05-30 NOTE — Progress Notes (Signed)
Faxed referral order, demographics, chart information to [facility] for Dr. Binnie Rail [phone]. Notified radiology to push over MRI and last NETSPOT images.

## 2021-06-01 ENCOUNTER — Telehealth: Payer: Self-pay | Admitting: *Deleted

## 2021-06-01 NOTE — Telephone Encounter (Signed)
Called patient to f/u on status of referral to Dr. Fayrene Helper at [facility]. He has not heard from office yet. Provided him phone # for new patient coordinator to call and follow up. Records/referral were all sent on 12/27.

## 2021-06-12 DIAGNOSIS — C7B8 Other secondary neuroendocrine tumors: Secondary | ICD-10-CM | POA: Diagnosis not present

## 2021-06-13 ENCOUNTER — Other Ambulatory Visit: Payer: Self-pay

## 2021-06-13 ENCOUNTER — Inpatient Hospital Stay: Payer: Federal, State, Local not specified - PPO | Attending: Nurse Practitioner

## 2021-06-13 ENCOUNTER — Encounter: Payer: Self-pay | Admitting: Oncology

## 2021-06-13 ENCOUNTER — Inpatient Hospital Stay: Payer: Federal, State, Local not specified - PPO | Admitting: Oncology

## 2021-06-13 ENCOUNTER — Inpatient Hospital Stay: Payer: Federal, State, Local not specified - PPO

## 2021-06-13 VITALS — BP 140/75 | HR 81 | Temp 97.8°F | Resp 20 | Ht 71.0 in | Wt 220.0 lb

## 2021-06-13 DIAGNOSIS — C7B04 Secondary carcinoid tumors of peritoneum: Secondary | ICD-10-CM | POA: Insufficient documentation

## 2021-06-13 DIAGNOSIS — E039 Hypothyroidism, unspecified: Secondary | ICD-10-CM | POA: Insufficient documentation

## 2021-06-13 DIAGNOSIS — R197 Diarrhea, unspecified: Secondary | ICD-10-CM | POA: Diagnosis not present

## 2021-06-13 DIAGNOSIS — Z79899 Other long term (current) drug therapy: Secondary | ICD-10-CM | POA: Insufficient documentation

## 2021-06-13 DIAGNOSIS — I10 Essential (primary) hypertension: Secondary | ICD-10-CM | POA: Insufficient documentation

## 2021-06-13 DIAGNOSIS — C7B02 Secondary carcinoid tumors of liver: Secondary | ICD-10-CM

## 2021-06-13 DIAGNOSIS — R232 Flushing: Secondary | ICD-10-CM | POA: Insufficient documentation

## 2021-06-13 DIAGNOSIS — C7A098 Malignant carcinoid tumors of other sites: Secondary | ICD-10-CM | POA: Diagnosis not present

## 2021-06-13 DIAGNOSIS — R978 Other abnormal tumor markers: Secondary | ICD-10-CM | POA: Diagnosis not present

## 2021-06-13 DIAGNOSIS — D3A098 Benign carcinoid tumors of other sites: Secondary | ICD-10-CM

## 2021-06-13 LAB — CMP (CANCER CENTER ONLY)
ALT: 38 U/L (ref 0–44)
AST: 25 U/L (ref 15–41)
Albumin: 4.4 g/dL (ref 3.5–5.0)
Alkaline Phosphatase: 86 U/L (ref 38–126)
Anion gap: 9 (ref 5–15)
BUN: 17 mg/dL (ref 6–20)
CO2: 25 mmol/L (ref 22–32)
Calcium: 9.9 mg/dL (ref 8.9–10.3)
Chloride: 106 mmol/L (ref 98–111)
Creatinine: 1.4 mg/dL — ABNORMAL HIGH (ref 0.61–1.24)
GFR, Estimated: 59 mL/min — ABNORMAL LOW (ref >60)
Glucose, Bld: 112 mg/dL — ABNORMAL HIGH (ref 70–99)
Potassium: 4.4 mmol/L (ref 3.5–5.1)
Sodium: 140 mmol/L (ref 135–145)
Total Bilirubin: 0.8 mg/dL (ref 0.3–1.2)
Total Protein: 7.2 g/dL (ref 6.5–8.1)

## 2021-06-13 MED ORDER — LANREOTIDE ACETATE 120 MG/0.5ML ~~LOC~~ SOLN
120.0000 mg | Freq: Once | SUBCUTANEOUS | Status: AC
  Administered 2021-06-13: 120 mg via SUBCUTANEOUS

## 2021-06-13 NOTE — Progress Notes (Signed)
William Hancock OFFICE PROGRESS NOTE   Diagnosis: Carcinoid tumor  INTERVAL HISTORY:   William Hancock returns as scheduled.  He feels well.  He has occasional diarrhea.  He has flushing only when he drinks alcohol.  No other complaint. He saw William Hancock yesterday.  He reports William Hancock does not recommend hepatic surgery.  He is scheduled for a follow-up consultation with William Hancock later this week.  Objective:  Vital signs in last 24 hours:  Blood pressure 140/75, pulse 81, temperature 97.8 F (36.6 C), temperature source Oral, resp. rate 20, height $RemoveBe'5\' 11"'JffoXtMrP$  (1.803 m), weight 220 lb (99.8 kg), SpO2 100 %.    Resp: Lungs clear bilaterally Cardio: Regular rate and rhythm GI: No hepatomegaly, nontender Vascular: No leg edema    Lab Results:  Lab Results  Component Value Date   WBC 5.5 03/21/2021   HGB 14.8 03/21/2021   HCT 41.8 03/21/2021   MCV 90.5 03/21/2021   PLT 186 03/21/2021   NEUTROABS 3.2 03/21/2021    CMP  Lab Results  Component Value Date   NA 140 06/13/2021   K 4.4 06/13/2021   CL 106 06/13/2021   CO2 25 06/13/2021   GLUCOSE 112 (H) 06/13/2021   BUN 17 06/13/2021   CREATININE 1.40 (H) 06/13/2021   CALCIUM 9.9 06/13/2021   PROT 7.2 06/13/2021   ALBUMIN 4.4 06/13/2021   AST 25 06/13/2021   ALT 38 06/13/2021   ALKPHOS 86 06/13/2021   BILITOT 0.8 06/13/2021   GFRNONAA 59 (L) 06/13/2021   GFRAA >60 02/26/2020     Medications: I have reviewed the patient's current medications.   Assessment/Plan: Metastatic neuroendocrine tumor, well-differentiated 10/31/2018 Abdominal ultrasound-numerous nearly confluent heterogeneous masses measuring up to 3.5 cm.   11/04/2018 CT abdomen/pelvis-multiple enhancing hepatic lesions and a central partially calcified spiculated mesenteric mass measuring 3.1 x 3.1 cm.   11/13/2018 biopsy liver lesion-metastatic well-differentiated neuroendocrine tumor positive for CKAE1-AE3, synaptophysin, chromogranin, CD56 and  CDX-2; Ki-67 labeling index less than 3%; TTF-1 negative Netspot 12/12/2018- increased activity associated a lobular mesenteric mass, small bowel medial to the mesenteric mass, and multiple liver lesions Elevated chromogranin A level and 24-hour urine 5 HIAA Monthly lanreotide beginning 12/23/2018 CT 05/13/2019-multiple large hepatic metastases on the dotatate are poorly visualized, several small lesions are decreased in size 1 new lesion, stable mesenteric mass Netspot 09/04/2019-increase in size of left and right liver lesions, stable to slightly decreased radiotracer accumulation, stable central mesenteric mass, no new metastatic lesions Lutathera 10/13/2019 Lutathera 12/04/2019 Lutathera 01/29/2020 Lutathera 03/25/2020 Restaging Netspot 04/22/2020-overall positive measurable response to therapy.  Multiple hepatic metastasis continue to demonstrate intense radiotracer activity, the activity is consistently decreased from preradiotherapy scan.  No evidence of progressive disease in the liver.  Central mesenteric metastasis and adjacent small bowel lesion have very similar metabolic activity and no change in size.  No evidence of new peritoneal metastasis or skeletal metastasis. Monthly lanreotide continued Netspot 03/02/2021-stable radiotracer avid confluent left and right liver lesions, stable central mesenteric nodal metastasis, no new sites of metastatic disease Monthly lanreotide continued MRI abdomen 05/22/2021-innumerable confluent contrast-enhancing liver lesions-not significantly changed compared to the PET/CT, stable mesenteric metastasis History of MI/stent placement CAD Hypertension Renal insufficiency Hypothyroidism diagnosed with markedly elevated TSH and low T4 on 12/18/2018 Acute left elbow/right ankle pain and swelling August 2021    Disposition: William Hancock appears stable.  He will continue monthly lanreotide.  He plans to follow-up with surgical oncology at [facility] later this week.  He  will then decide on proceeding with hepatic resection if this is recommended.  He saw William Hancock yesterday and surgery was not recommended.  William Hancock will return for an office visit in 3 months.  We will follow-up on the chromogranin a level from today.  William Coder, MD  06/13/2021  8:56 AM

## 2021-06-13 NOTE — Patient Instructions (Signed)
Lanreotide injection °What is this medication? °LANREOTIDE (lan REE oh tide) is used to reduce blood levels of growth hormone in patients with a condition called acromegaly. It also works to slow or stop tumor growth in patients with neuroendocrine tumors and treat carcinoid syndrome. °This medicine may be used for other purposes; ask your health care provider or pharmacist if you have questions. °COMMON BRAND NAME(S): Somatuline Depot °What should I tell my care team before I take this medication? °They need to know if you have any of these conditions: °diabetes °gallbladder disease °heart disease °kidney disease °liver disease °thyroid disease °an unusual or allergic reaction to lanreotide, other medicines, foods, dyes, or preservatives °pregnant or trying to get pregnant °breast-feeding °How should I use this medication? °This medicine is for injection under the skin. It is given by a health care professional in a hospital or clinic setting. °Contact your pediatrician or health care professional regarding the use of this medicine in children. Special care may be needed. °Overdosage: If you think you have taken too much of this medicine contact a poison control center or emergency room at once. °NOTE: This medicine is only for you. Do not share this medicine with others. °What if I miss a dose? °It is important not to miss your dose. Call your doctor or health care professional if you are unable to keep an appointment. °What may interact with this medication? °This medicine may interact with the following medications: °bromocriptine °cyclosporine °certain medicines for blood pressure, heart disease, irregular heart beat °certain medicines for diabetes °quinidine °terfenadine °This list may not describe all possible interactions. Give your health care provider a list of all the medicines, herbs, non-prescription drugs, or dietary supplements you use. Also tell them if you smoke, drink alcohol, or use illegal drugs.  Some items may interact with your medicine. °What should I watch for while using this medication? °Tell your doctor or healthcare professional if your symptoms do not start to get better or if they get worse. °Visit your doctor or health care professional for regular checks on your progress. Your condition will be monitored carefully while you are receiving this medicine. °This medicine may increase blood sugar. Ask your healthcare provider if changes in diet or medicines are needed if you have diabetes. °You may need blood work done while you are taking this medicine. °Women should inform their doctor if they wish to become pregnant or think they might be pregnant. There is a potential for serious side effects to an unborn child. Talk to your health care professional or pharmacist for more information. Do not breast-feed an infant while taking this medicine or for 6 months after stopping it. °This medicine has caused ovarian failure in some women. This medicine may interfere with the ability to have a child. Talk with your doctor or health care professional if you are concerned about your fertility. °What side effects may I notice from receiving this medication? °Side effects that you should report to your doctor or health care professional as soon as possible: °allergic reactions like skin rash, itching or hives, swelling of the face, lips, or tongue °increased blood pressure °severe stomach pain °signs and symptoms of hgh blood sugar such as being more thirsty or hungry or having to urinate more than normal. You may also feel very tired or have blurry vision. °signs and symptoms of low blood sugar such as feeling anxious; confusion; dizziness; increased hunger; unusually weak or tired; sweating; shakiness; cold; irritable; headache; blurred vision; fast   heartbeat; loss of consciousness °unusually slow heartbeat °Side effects that usually do not require medical attention (report to your doctor or health care  professional if they continue or are bothersome): °constipation °diarrhea °dizziness °headache °muscle pain °muscle spasms °nausea °pain, redness, or irritation at site where injected °This list may not describe all possible side effects. Call your doctor for medical advice about side effects. You may report side effects to FDA at 1-800-FDA-1088. °Where should I keep my medication? °This drug is given in a hospital or clinic and will not be stored at home. °NOTE: This sheet is a summary. It may not cover all possible information. If you have questions about this medicine, talk to your doctor, pharmacist, or health care provider. °© 2022 Elsevier/Gold Standard (2018-04-09 00:00:00) ° °

## 2021-06-15 LAB — CHROMOGRANIN A: Chromogranin A (ng/mL): 2216 ng/mL — ABNORMAL HIGH (ref 0.0–101.8)

## 2021-06-29 ENCOUNTER — Telehealth: Payer: Self-pay | Admitting: *Deleted

## 2021-06-29 DIAGNOSIS — C7B02 Secondary carcinoid tumors of liver: Secondary | ICD-10-CM

## 2021-06-29 NOTE — Telephone Encounter (Signed)
Received fax from [facility] in [place] that patient is having liver directed therapy on 2/15/223 and needs CBC/CMP 2 weeks and 4 weeks afterwards. Also needs MRI liver w/wo contrast at 4 week time period. O Orders placed and scheduling message sent.

## 2021-06-30 ENCOUNTER — Encounter: Payer: Self-pay | Admitting: Oncology

## 2021-07-11 ENCOUNTER — Ambulatory Visit: Payer: Federal, State, Local not specified - PPO

## 2021-07-11 ENCOUNTER — Inpatient Hospital Stay: Payer: Federal, State, Local not specified - PPO | Attending: Nurse Practitioner

## 2021-07-11 ENCOUNTER — Other Ambulatory Visit: Payer: Self-pay | Admitting: Cardiology

## 2021-07-11 ENCOUNTER — Other Ambulatory Visit: Payer: Self-pay

## 2021-07-11 ENCOUNTER — Encounter: Payer: Self-pay | Admitting: Cardiology

## 2021-07-11 VITALS — BP 143/90 | HR 64 | Temp 98.6°F | Resp 18 | Wt 221.0 lb

## 2021-07-11 DIAGNOSIS — I2510 Atherosclerotic heart disease of native coronary artery without angina pectoris: Secondary | ICD-10-CM

## 2021-07-11 DIAGNOSIS — C7A098 Malignant carcinoid tumors of other sites: Secondary | ICD-10-CM | POA: Insufficient documentation

## 2021-07-11 DIAGNOSIS — C7B02 Secondary carcinoid tumors of liver: Secondary | ICD-10-CM | POA: Insufficient documentation

## 2021-07-11 DIAGNOSIS — C7B04 Secondary carcinoid tumors of peritoneum: Secondary | ICD-10-CM | POA: Diagnosis not present

## 2021-07-11 DIAGNOSIS — R197 Diarrhea, unspecified: Secondary | ICD-10-CM | POA: Insufficient documentation

## 2021-07-11 DIAGNOSIS — D3A098 Benign carcinoid tumors of other sites: Secondary | ICD-10-CM

## 2021-07-11 MED ORDER — ATORVASTATIN CALCIUM 40 MG PO TABS: 40.0000 mg | ORAL_TABLET | Freq: Every day | ORAL | 3 refills | Status: DC

## 2021-07-11 MED ORDER — NITROGLYCERIN 0.4 MG SL SUBL: 0.4000 mg | SUBLINGUAL_TABLET | SUBLINGUAL | 1 refills | Status: DC | PRN

## 2021-07-11 MED ORDER — METOPROLOL SUCCINATE ER 25 MG PO TB24: 25.0000 mg | ORAL_TABLET | Freq: Two times a day (BID) | ORAL | 3 refills | Status: DC

## 2021-07-11 MED ORDER — AMLODIPINE BESYLATE 5 MG PO TABS: 5.0000 mg | ORAL_TABLET | Freq: Every day | ORAL | 3 refills | Status: DC

## 2021-07-11 MED ORDER — LANREOTIDE ACETATE 120 MG/0.5ML ~~LOC~~ SOLN
120.0000 mg | Freq: Once | SUBCUTANEOUS | Status: AC
  Administered 2021-07-11: 120 mg via SUBCUTANEOUS
  Filled 2021-07-11: qty 120

## 2021-07-11 MED ORDER — ASPIRIN 81 MG PO CHEW: 81.0000 mg | CHEWABLE_TABLET | Freq: Every day | ORAL | 3 refills | Status: DC

## 2021-07-11 MED ORDER — AMLODIPINE BESYLATE 2.5 MG PO TABS: 2.5000 mg | ORAL_TABLET | Freq: Every day | ORAL | 3 refills | Status: DC

## 2021-07-11 NOTE — Patient Instructions (Signed)
Lanreotide injection °What is this medication? °LANREOTIDE (lan REE oh tide) is used to reduce blood levels of growth hormone in patients with a condition called acromegaly. It also works to slow or stop tumor growth in patients with neuroendocrine tumors and treat carcinoid syndrome. °This medicine may be used for other purposes; ask your health care provider or pharmacist if you have questions. °COMMON BRAND NAME(S): Somatuline Depot °What should I tell my care team before I take this medication? °They need to know if you have any of these conditions: °diabetes °gallbladder disease °heart disease °kidney disease °liver disease °thyroid disease °an unusual or allergic reaction to lanreotide, other medicines, foods, dyes, or preservatives °pregnant or trying to get pregnant °breast-feeding °How should I use this medication? °This medicine is for injection under the skin. It is given by a health care professional in a hospital or clinic setting. °Contact your pediatrician or health care professional regarding the use of this medicine in children. Special care may be needed. °Overdosage: If you think you have taken too much of this medicine contact a poison control center or emergency room at once. °NOTE: This medicine is only for you. Do not share this medicine with others. °What if I miss a dose? °It is important not to miss your dose. Call your doctor or health care professional if you are unable to keep an appointment. °What may interact with this medication? °This medicine may interact with the following medications: °bromocriptine °cyclosporine °certain medicines for blood pressure, heart disease, irregular heart beat °certain medicines for diabetes °quinidine °terfenadine °This list may not describe all possible interactions. Give your health care provider a list of all the medicines, herbs, non-prescription drugs, or dietary supplements you use. Also tell them if you smoke, drink alcohol, or use illegal drugs.  Some items may interact with your medicine. °What should I watch for while using this medication? °Tell your doctor or healthcare professional if your symptoms do not start to get better or if they get worse. °Visit your doctor or health care professional for regular checks on your progress. Your condition will be monitored carefully while you are receiving this medicine. °This medicine may increase blood sugar. Ask your healthcare provider if changes in diet or medicines are needed if you have diabetes. °You may need blood work done while you are taking this medicine. °Women should inform their doctor if they wish to become pregnant or think they might be pregnant. There is a potential for serious side effects to an unborn child. Talk to your health care professional or pharmacist for more information. Do not breast-feed an infant while taking this medicine or for 6 months after stopping it. °This medicine has caused ovarian failure in some women. This medicine may interfere with the ability to have a child. Talk with your doctor or health care professional if you are concerned about your fertility. °What side effects may I notice from receiving this medication? °Side effects that you should report to your doctor or health care professional as soon as possible: °allergic reactions like skin rash, itching or hives, swelling of the face, lips, or tongue °increased blood pressure °severe stomach pain °signs and symptoms of hgh blood sugar such as being more thirsty or hungry or having to urinate more than normal. You may also feel very tired or have blurry vision. °signs and symptoms of low blood sugar such as feeling anxious; confusion; dizziness; increased hunger; unusually weak or tired; sweating; shakiness; cold; irritable; headache; blurred vision; fast   heartbeat; loss of consciousness °unusually slow heartbeat °Side effects that usually do not require medical attention (report to your doctor or health care  professional if they continue or are bothersome): °constipation °diarrhea °dizziness °headache °muscle pain °muscle spasms °nausea °pain, redness, or irritation at site where injected °This list may not describe all possible side effects. Call your doctor for medical advice about side effects. You may report side effects to FDA at 1-800-FDA-1088. °Where should I keep my medication? °This drug is given in a hospital or clinic and will not be stored at home. °NOTE: This sheet is a summary. It may not cover all possible information. If you have questions about this medicine, talk to your doctor, pharmacist, or health care provider. °© 2022 Elsevier/Gold Standard (2018-04-09 00:00:00) ° °

## 2021-07-11 NOTE — Telephone Encounter (Signed)
From patient.

## 2021-07-12 ENCOUNTER — Inpatient Hospital Stay: Payer: Federal, State, Local not specified - PPO

## 2021-07-12 ENCOUNTER — Inpatient Hospital Stay: Payer: Federal, State, Local not specified - PPO | Admitting: Oncology

## 2021-07-19 ENCOUNTER — Ambulatory Visit: Payer: Federal, State, Local not specified - PPO | Admitting: Cardiology

## 2021-07-26 ENCOUNTER — Encounter: Payer: Self-pay | Admitting: *Deleted

## 2021-07-26 ENCOUNTER — Other Ambulatory Visit: Payer: Self-pay | Admitting: *Deleted

## 2021-07-26 DIAGNOSIS — D3A098 Benign carcinoid tumors of other sites: Secondary | ICD-10-CM

## 2021-07-26 NOTE — Progress Notes (Signed)
Lab orders placed at request of [facility] in [place] for 08/02/21. Will need to fax labs to them at [phone]. Also placed lab orders and MRI order for 08/16/21

## 2021-07-27 ENCOUNTER — Telehealth: Payer: Self-pay | Admitting: Oncology

## 2021-07-27 NOTE — Telephone Encounter (Signed)
Attempted to contact patient to schedule lab appt for 3/15, per inbasket. No answer so voicemail was left for patient to call back.

## 2021-07-31 ENCOUNTER — Encounter: Payer: Self-pay | Admitting: Oncology

## 2021-07-31 DIAGNOSIS — N289 Disorder of kidney and ureter, unspecified: Secondary | ICD-10-CM | POA: Diagnosis not present

## 2021-07-31 DIAGNOSIS — N189 Chronic kidney disease, unspecified: Secondary | ICD-10-CM | POA: Diagnosis not present

## 2021-07-31 DIAGNOSIS — C7B8 Other secondary neuroendocrine tumors: Secondary | ICD-10-CM | POA: Diagnosis not present

## 2021-08-01 ENCOUNTER — Other Ambulatory Visit: Payer: Self-pay

## 2021-08-01 DIAGNOSIS — D3A098 Benign carcinoid tumors of other sites: Secondary | ICD-10-CM

## 2021-08-02 ENCOUNTER — Inpatient Hospital Stay: Payer: Federal, State, Local not specified - PPO

## 2021-08-04 DIAGNOSIS — N289 Disorder of kidney and ureter, unspecified: Secondary | ICD-10-CM | POA: Diagnosis not present

## 2021-08-04 DIAGNOSIS — N189 Chronic kidney disease, unspecified: Secondary | ICD-10-CM | POA: Diagnosis not present

## 2021-08-04 DIAGNOSIS — C7B8 Other secondary neuroendocrine tumors: Secondary | ICD-10-CM | POA: Diagnosis not present

## 2021-08-07 ENCOUNTER — Other Ambulatory Visit: Payer: Self-pay | Admitting: *Deleted

## 2021-08-07 DIAGNOSIS — D3A098 Benign carcinoid tumors of other sites: Secondary | ICD-10-CM

## 2021-08-07 NOTE — Progress Notes (Signed)
Call from office of Dr. Deniece Ree requesting labs at his injection appointment tomorrow and to be faxed to [phone]. Orders placed and scheduling message sent. ?

## 2021-08-08 ENCOUNTER — Encounter: Payer: Self-pay | Admitting: *Deleted

## 2021-08-08 ENCOUNTER — Inpatient Hospital Stay: Payer: Federal, State, Local not specified - PPO | Attending: Nurse Practitioner

## 2021-08-08 ENCOUNTER — Other Ambulatory Visit: Payer: Self-pay

## 2021-08-08 ENCOUNTER — Inpatient Hospital Stay: Payer: Federal, State, Local not specified - PPO

## 2021-08-08 VITALS — BP 137/85 | HR 66 | Temp 98.6°F | Resp 18 | Ht 71.0 in | Wt 204.2 lb

## 2021-08-08 DIAGNOSIS — R197 Diarrhea, unspecified: Secondary | ICD-10-CM | POA: Diagnosis not present

## 2021-08-08 DIAGNOSIS — C7A098 Malignant carcinoid tumors of other sites: Secondary | ICD-10-CM | POA: Insufficient documentation

## 2021-08-08 DIAGNOSIS — C7B04 Secondary carcinoid tumors of peritoneum: Secondary | ICD-10-CM | POA: Insufficient documentation

## 2021-08-08 DIAGNOSIS — C7B02 Secondary carcinoid tumors of liver: Secondary | ICD-10-CM | POA: Diagnosis not present

## 2021-08-08 DIAGNOSIS — D3A098 Benign carcinoid tumors of other sites: Secondary | ICD-10-CM

## 2021-08-08 LAB — CMP (CANCER CENTER ONLY)
ALT: 34 U/L (ref 0–44)
AST: 32 U/L (ref 15–41)
Albumin: 4.5 g/dL (ref 3.5–5.0)
Alkaline Phosphatase: 397 U/L — ABNORMAL HIGH (ref 38–126)
Anion gap: 9 (ref 5–15)
BUN: 31 mg/dL — ABNORMAL HIGH (ref 6–20)
CO2: 27 mmol/L (ref 22–32)
Calcium: 10.2 mg/dL (ref 8.9–10.3)
Chloride: 102 mmol/L (ref 98–111)
Creatinine: 1.95 mg/dL — ABNORMAL HIGH (ref 0.61–1.24)
GFR, Estimated: 39 mL/min — ABNORMAL LOW
Glucose, Bld: 97 mg/dL (ref 70–99)
Potassium: 5 mmol/L (ref 3.5–5.1)
Sodium: 138 mmol/L (ref 135–145)
Total Bilirubin: 0.9 mg/dL (ref 0.3–1.2)
Total Protein: 7.9 g/dL (ref 6.5–8.1)

## 2021-08-08 LAB — CBC WITH DIFFERENTIAL (CANCER CENTER ONLY)
Abs Immature Granulocytes: 0.07 10*3/uL (ref 0.00–0.07)
Basophils Absolute: 0.1 10*3/uL (ref 0.0–0.1)
Basophils Relative: 2 %
Eosinophils Absolute: 0.5 10*3/uL (ref 0.0–0.5)
Eosinophils Relative: 7 %
HCT: 32.9 % — ABNORMAL LOW (ref 39.0–52.0)
Hemoglobin: 10.9 g/dL — ABNORMAL LOW (ref 13.0–17.0)
Immature Granulocytes: 1 %
Lymphocytes Relative: 17 %
Lymphs Abs: 1.2 10*3/uL (ref 0.7–4.0)
MCH: 31.1 pg (ref 26.0–34.0)
MCHC: 33.1 g/dL (ref 30.0–36.0)
MCV: 93.7 fL (ref 80.0–100.0)
Monocytes Absolute: 0.6 10*3/uL (ref 0.1–1.0)
Monocytes Relative: 8 %
Neutro Abs: 4.9 10*3/uL (ref 1.7–7.7)
Neutrophils Relative %: 71 %
Platelet Count: 495 10*3/uL — ABNORMAL HIGH (ref 150–400)
RBC: 3.51 MIL/uL — ABNORMAL LOW (ref 4.22–5.81)
RDW: 13.3 % (ref 11.5–15.5)
WBC Count: 7 10*3/uL (ref 4.0–10.5)
nRBC: 0 % (ref 0.0–0.2)

## 2021-08-08 LAB — C-REACTIVE PROTEIN: CRP: 0.9 mg/dL (ref <1.0)

## 2021-08-08 MED ORDER — LANREOTIDE ACETATE 120 MG/0.5ML ~~LOC~~ SOLN
120.0000 mg | Freq: Once | SUBCUTANEOUS | Status: AC
  Administered 2021-08-08: 120 mg via SUBCUTANEOUS
  Filled 2021-08-08: qty 120

## 2021-08-08 NOTE — Progress Notes (Addendum)
Faxed CBC/CMP/CRP results to Dr. Deniece Ree at [phone] as requested.  ?

## 2021-08-08 NOTE — Patient Instructions (Signed)
Lanreotide injection °What is this medication? °LANREOTIDE (lan REE oh tide) is used to reduce blood levels of growth hormone in patients with a condition called acromegaly. It also works to slow or stop tumor growth in patients with neuroendocrine tumors and treat carcinoid syndrome. °This medicine may be used for other purposes; ask your health care provider or pharmacist if you have questions. °COMMON BRAND NAME(S): Somatuline Depot °What should I tell my care team before I take this medication? °They need to know if you have any of these conditions: °diabetes °gallbladder disease °heart disease °kidney disease °liver disease °thyroid disease °an unusual or allergic reaction to lanreotide, other medicines, foods, dyes, or preservatives °pregnant or trying to get pregnant °breast-feeding °How should I use this medication? °This medicine is for injection under the skin. It is given by a health care professional in a hospital or clinic setting. °Contact your pediatrician or health care professional regarding the use of this medicine in children. Special care may be needed. °Overdosage: If you think you have taken too much of this medicine contact a poison control center or emergency room at once. °NOTE: This medicine is only for you. Do not share this medicine with others. °What if I miss a dose? °It is important not to miss your dose. Call your doctor or health care professional if you are unable to keep an appointment. °What may interact with this medication? °This medicine may interact with the following medications: °bromocriptine °cyclosporine °certain medicines for blood pressure, heart disease, irregular heart beat °certain medicines for diabetes °quinidine °terfenadine °This list may not describe all possible interactions. Give your health care provider a list of all the medicines, herbs, non-prescription drugs, or dietary supplements you use. Also tell them if you smoke, drink alcohol, or use illegal drugs.  Some items may interact with your medicine. °What should I watch for while using this medication? °Tell your doctor or healthcare professional if your symptoms do not start to get better or if they get worse. °Visit your doctor or health care professional for regular checks on your progress. Your condition will be monitored carefully while you are receiving this medicine. °This medicine may increase blood sugar. Ask your healthcare provider if changes in diet or medicines are needed if you have diabetes. °You may need blood work done while you are taking this medicine. °Women should inform their doctor if they wish to become pregnant or think they might be pregnant. There is a potential for serious side effects to an unborn child. Talk to your health care professional or pharmacist for more information. Do not breast-feed an infant while taking this medicine or for 6 months after stopping it. °This medicine has caused ovarian failure in some women. This medicine may interfere with the ability to have a child. Talk with your doctor or health care professional if you are concerned about your fertility. °What side effects may I notice from receiving this medication? °Side effects that you should report to your doctor or health care professional as soon as possible: °allergic reactions like skin rash, itching or hives, swelling of the face, lips, or tongue °increased blood pressure °severe stomach pain °signs and symptoms of hgh blood sugar such as being more thirsty or hungry or having to urinate more than normal. You may also feel very tired or have blurry vision. °signs and symptoms of low blood sugar such as feeling anxious; confusion; dizziness; increased hunger; unusually weak or tired; sweating; shakiness; cold; irritable; headache; blurred vision; fast   heartbeat; loss of consciousness °unusually slow heartbeat °Side effects that usually do not require medical attention (report to your doctor or health care  professional if they continue or are bothersome): °constipation °diarrhea °dizziness °headache °muscle pain °muscle spasms °nausea °pain, redness, or irritation at site where injected °This list may not describe all possible side effects. Call your doctor for medical advice about side effects. You may report side effects to FDA at 1-800-FDA-1088. °Where should I keep my medication? °This drug is given in a hospital or clinic and will not be stored at home. °NOTE: This sheet is a summary. It may not cover all possible information. If you have questions about this medicine, talk to your doctor, pharmacist, or health care provider. °© 2022 Elsevier/Gold Standard (2018-04-09 00:00:00) ° °

## 2021-08-14 ENCOUNTER — Encounter: Payer: Self-pay | Admitting: Cardiology

## 2021-08-14 DIAGNOSIS — H35033 Hypertensive retinopathy, bilateral: Secondary | ICD-10-CM | POA: Diagnosis not present

## 2021-08-14 NOTE — Telephone Encounter (Signed)
From patient.

## 2021-08-15 ENCOUNTER — Other Ambulatory Visit: Payer: Self-pay

## 2021-08-15 ENCOUNTER — Inpatient Hospital Stay: Payer: Federal, State, Local not specified - PPO

## 2021-08-15 DIAGNOSIS — R197 Diarrhea, unspecified: Secondary | ICD-10-CM | POA: Diagnosis not present

## 2021-08-15 DIAGNOSIS — C7B02 Secondary carcinoid tumors of liver: Secondary | ICD-10-CM | POA: Diagnosis not present

## 2021-08-15 DIAGNOSIS — C7B04 Secondary carcinoid tumors of peritoneum: Secondary | ICD-10-CM | POA: Diagnosis not present

## 2021-08-15 DIAGNOSIS — D3A098 Benign carcinoid tumors of other sites: Secondary | ICD-10-CM

## 2021-08-15 DIAGNOSIS — C7A098 Malignant carcinoid tumors of other sites: Secondary | ICD-10-CM | POA: Diagnosis not present

## 2021-08-15 LAB — CBC WITH DIFFERENTIAL (CANCER CENTER ONLY)
Abs Immature Granulocytes: 0.04 10*3/uL (ref 0.00–0.07)
Basophils Absolute: 0.1 10*3/uL (ref 0.0–0.1)
Basophils Relative: 2 %
Eosinophils Absolute: 0.7 10*3/uL — ABNORMAL HIGH (ref 0.0–0.5)
Eosinophils Relative: 12 %
HCT: 32.9 % — ABNORMAL LOW (ref 39.0–52.0)
Hemoglobin: 11.1 g/dL — ABNORMAL LOW (ref 13.0–17.0)
Immature Granulocytes: 1 %
Lymphocytes Relative: 22 %
Lymphs Abs: 1.2 10*3/uL (ref 0.7–4.0)
MCH: 31.3 pg (ref 26.0–34.0)
MCHC: 33.7 g/dL (ref 30.0–36.0)
MCV: 92.7 fL (ref 80.0–100.0)
Monocytes Absolute: 0.5 10*3/uL (ref 0.1–1.0)
Monocytes Relative: 10 %
Neutro Abs: 2.8 10*3/uL (ref 1.7–7.7)
Neutrophils Relative %: 53 %
Platelet Count: 287 10*3/uL (ref 150–400)
RBC: 3.55 MIL/uL — ABNORMAL LOW (ref 4.22–5.81)
RDW: 13.2 % (ref 11.5–15.5)
WBC Count: 5.3 10*3/uL (ref 4.0–10.5)
nRBC: 0 % (ref 0.0–0.2)

## 2021-08-15 LAB — CMP (CANCER CENTER ONLY)
ALT: 26 U/L (ref 0–44)
AST: 24 U/L (ref 15–41)
Albumin: 4.4 g/dL (ref 3.5–5.0)
Alkaline Phosphatase: 237 U/L — ABNORMAL HIGH (ref 38–126)
Anion gap: 8 (ref 5–15)
BUN: 24 mg/dL — ABNORMAL HIGH (ref 6–20)
CO2: 27 mmol/L (ref 22–32)
Calcium: 10.1 mg/dL (ref 8.9–10.3)
Chloride: 104 mmol/L (ref 98–111)
Creatinine: 1.73 mg/dL — ABNORMAL HIGH (ref 0.61–1.24)
GFR, Estimated: 45 mL/min — ABNORMAL LOW (ref >60)
Glucose, Bld: 100 mg/dL — ABNORMAL HIGH (ref 70–99)
Potassium: 4.7 mmol/L (ref 3.5–5.1)
Sodium: 139 mmol/L (ref 135–145)
Total Bilirubin: 0.7 mg/dL (ref 0.3–1.2)
Total Protein: 7.5 g/dL (ref 6.5–8.1)

## 2021-08-15 NOTE — Telephone Encounter (Signed)
Sure, will be happy to. ?Rise Paganini, can you hel with the referral please? For AKI. ? ?Thanks ?MJP ?

## 2021-08-16 ENCOUNTER — Other Ambulatory Visit: Payer: Self-pay | Admitting: Oncology

## 2021-08-16 ENCOUNTER — Ambulatory Visit (HOSPITAL_COMMUNITY)
Admission: RE | Admit: 2021-08-16 | Discharge: 2021-08-16 | Disposition: A | Discharge status: Home / Self Care | Payer: Federal, State, Local not specified - PPO | Source: Ambulatory Visit | Attending: Oncology | Admitting: Oncology

## 2021-08-16 DIAGNOSIS — R160 Hepatomegaly, not elsewhere classified: Secondary | ICD-10-CM | POA: Diagnosis not present

## 2021-08-16 DIAGNOSIS — D3A098 Benign carcinoid tumors of other sites: Secondary | ICD-10-CM | POA: Diagnosis not present

## 2021-08-16 DIAGNOSIS — K8020 Calculus of gallbladder without cholecystitis without obstruction: Secondary | ICD-10-CM | POA: Diagnosis not present

## 2021-08-16 DIAGNOSIS — C787 Secondary malignant neoplasm of liver and intrahepatic bile duct: Secondary | ICD-10-CM | POA: Diagnosis not present

## 2021-08-16 DIAGNOSIS — C7A00 Malignant carcinoid tumor of unspecified site: Secondary | ICD-10-CM | POA: Diagnosis not present

## 2021-08-16 IMAGING — MR MR ABDOMEN W/O CM
9 series · 48 of 48 positions shown · non-contrast
Comparison: MRI abdomen [DATE]

CLINICAL DATA: Carcinoid metastatic to liver, status post tace
procedure

EXAM:
MRI ABDOMEN WITHOUT CONTRAST
TECHNIQUE: Multiplanar multisequence MR imaging was performed without the
administration of intravenous contrast.

[Series 3: T2 · coronal · 6.0mm · 1.56mm/px · 3 of 40 slices shown (1 of 2)]
[im 1/40]
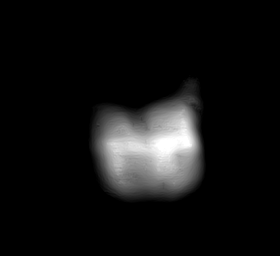
[im 20/40]
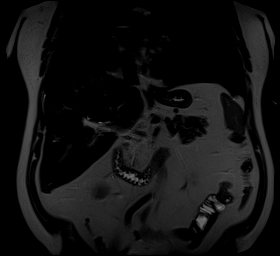
[im 40/40]
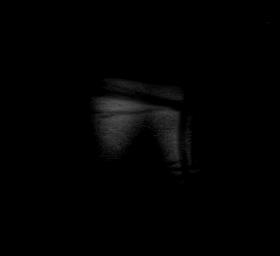

[Series 5: T2 fat-sat · axial · 6.0mm · 1.22mm/px · z∈[-143,+167]mm · 3 of 44 slices shown]
[im 1/44]
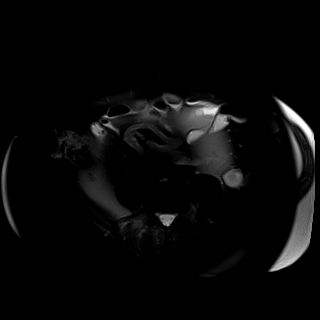
[im 22/44]
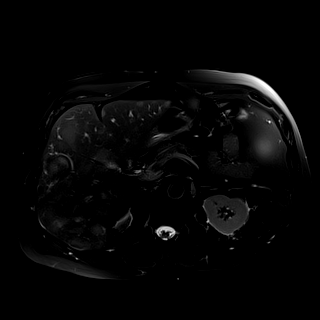
[im 44/44]
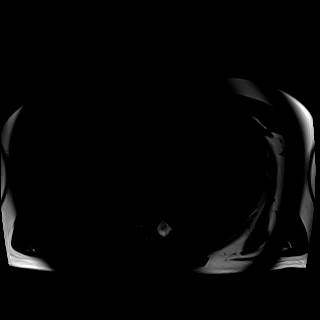

[Series 6: T1 · axial · 3.0mm · 1.25mm/px · z∈[-144,+141]mm · 8 of 96 slices shown (1 of 2)]
[im 1/96]
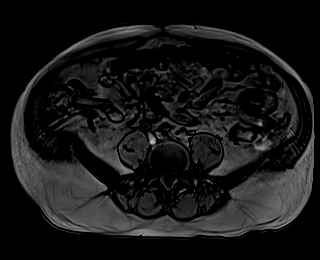
[im 14/96]
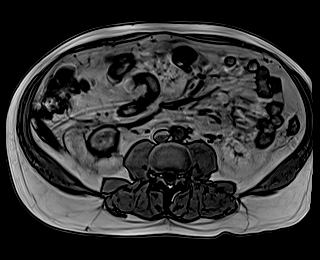
[im 28/96]
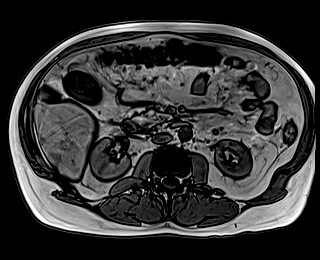
[im 41/96]
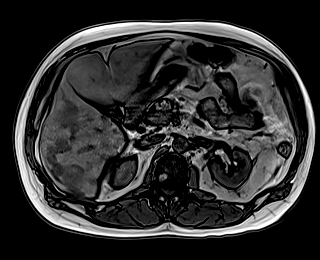
[im 55/96]
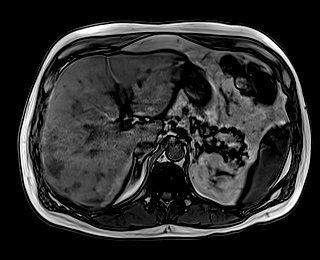
[im 68/96]
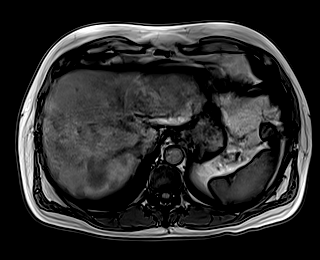
[im 82/96]
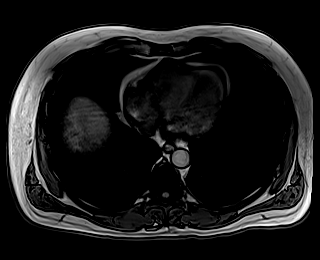
[im 96/96]
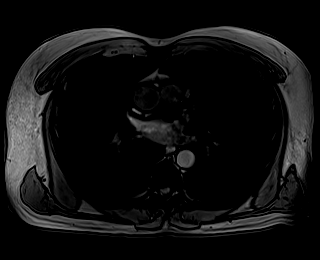

[Series 7: T1 · axial · 3.0mm · 1.25mm/px · z∈[-144,+141]mm · 8 of 96 slices shown (2 of 2)]
[im 1/96]
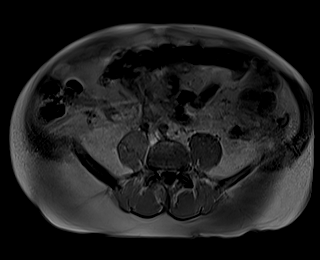
[im 14/96]
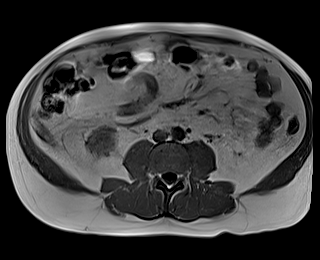
[im 28/96]
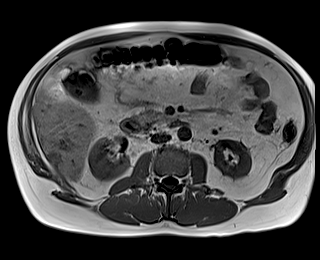
[im 41/96]
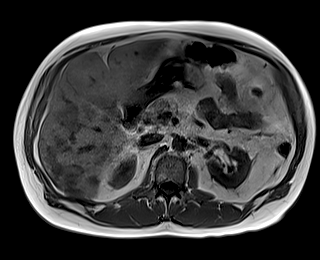
[im 55/96]
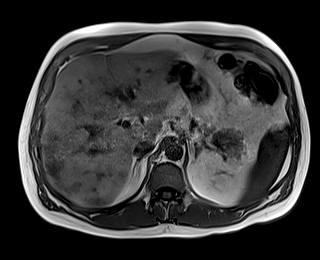
[im 68/96]
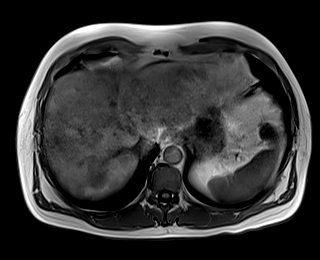
[im 82/96]
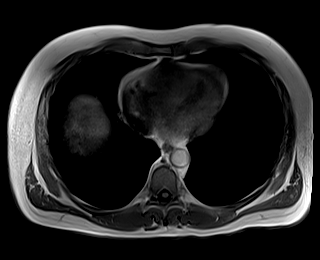
[im 96/96]
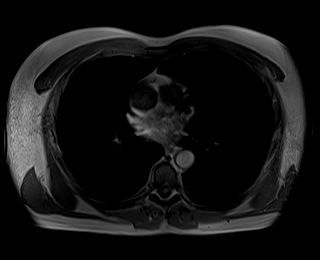

[Series 8: DWI · axial · 6.0mm · 1.46mm/px · z∈[-148,+147]mm · 7 of 84 slices shown (1 of 2)]
[im 1/84]
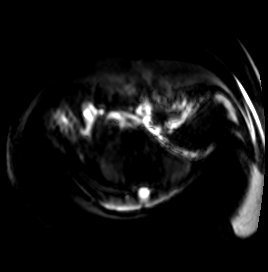
[im 14/84]
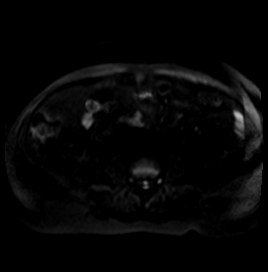
[im 28/84]
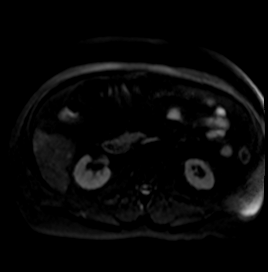
[im 42/84]
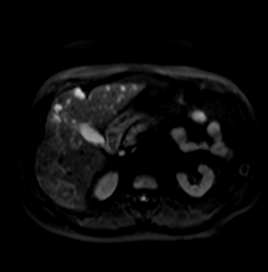
[im 56/84]
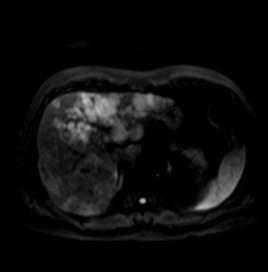
[im 70/84]
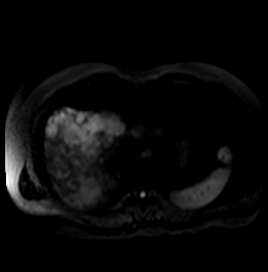
[im 84/84]
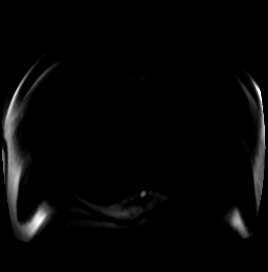

[Series 9: DWI · axial · 6.0mm · 1.46mm/px · z∈[-148,+147]mm · 3 of 42 slices shown (2 of 2)]
[im 1/42]
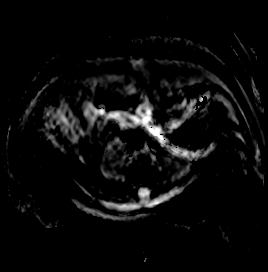
[im 21/42]
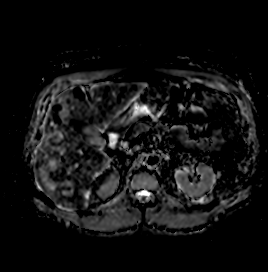
[im 42/42]
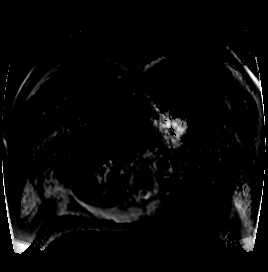

[Series 10: bSSFP · axial · 4.0mm · 0.84mm/px · z∈[-143,+129]mm · 5 of 69 slices shown]
[im 1/69]
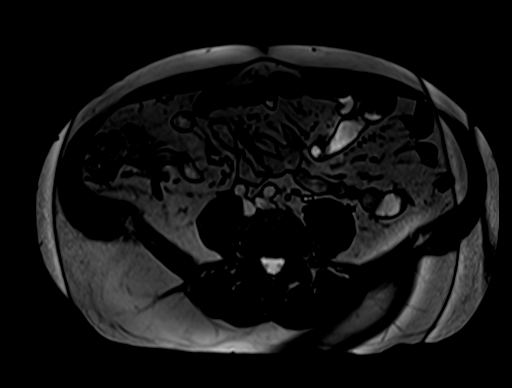
[im 18/69]
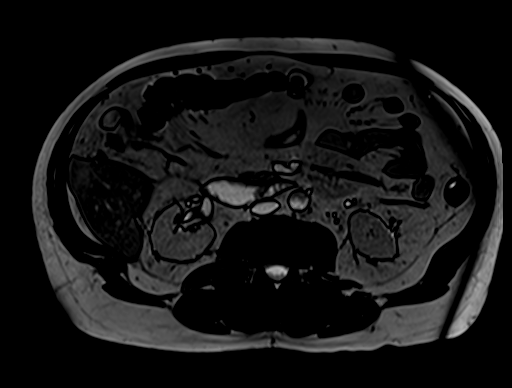
[im 35/69]
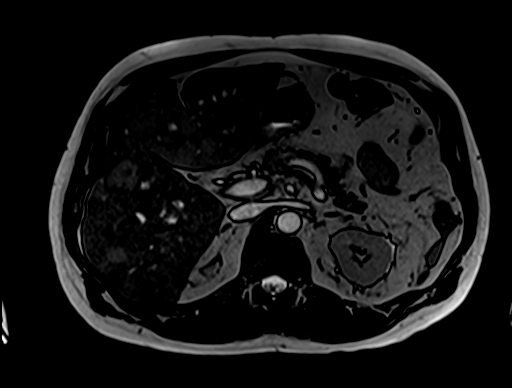
[im 52/69]
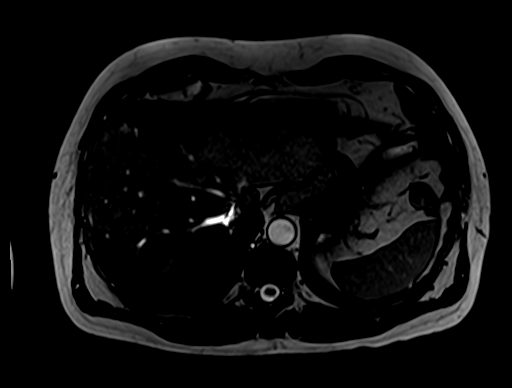
[im 69/69]
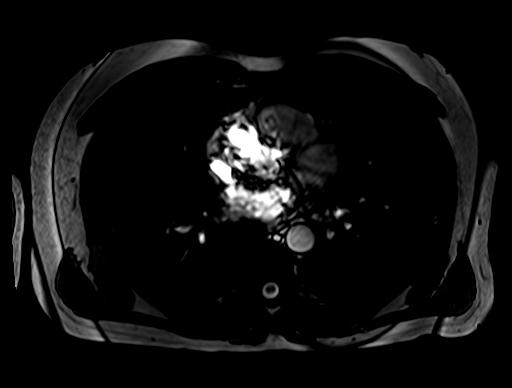

[Series 11: T2 · axial · 6.0mm · 1.56mm/px · z∈[-156,+139]mm · 3 of 42 slices shown (2 of 2)]
[im 1/42]
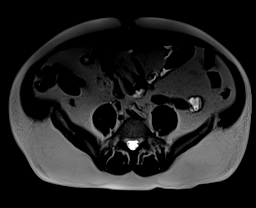
[im 21/42]
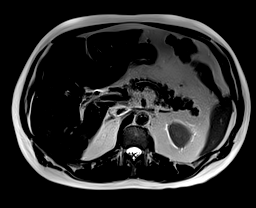
[im 42/42]
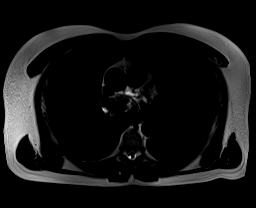

[Series 13: T1 dynamic · axial · 3.0mm · 1.22mm/px · z∈[-144,+141]mm · 8 of 96 slices shown]
[im 1/96]
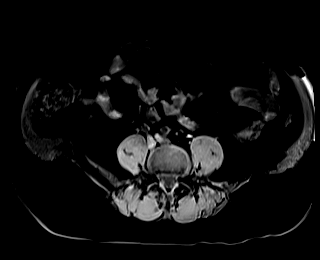
[im 14/96]
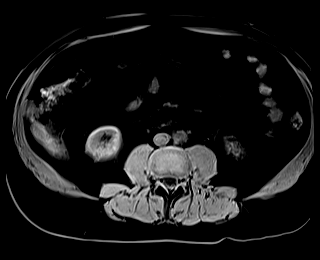
[im 28/96]
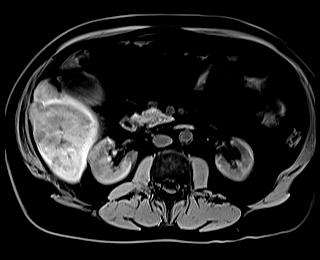
[im 41/96]
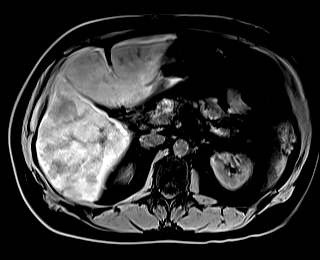
[im 55/96]
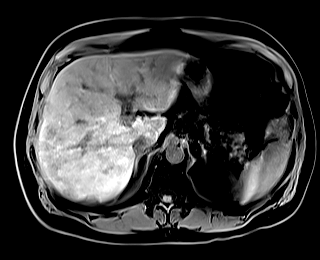
[im 68/96]
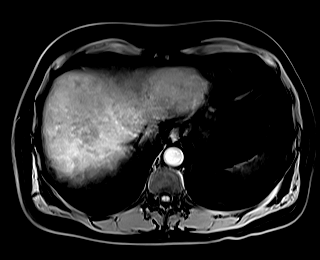
[im 82/96]
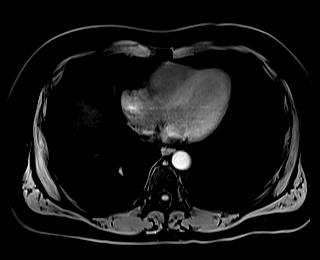
[im 96/96]
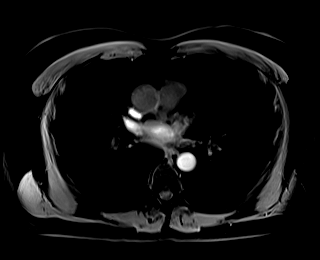

[48 of 48 positions shown; findings below may reference images not displayed]

FINDINGS: Limited due to motion and lack of contrast.

Lower chest: No acute findings.

Hepatobiliary: Liver is enlarged measuring 20.7 cm in length.
Redemonstration of innumerable mildly hyperintense T2 signal masses
throughout the liver. The masses have decreased in size and overall
bulk, as well as diminished T2 and DWI hyperintensity since previous
study especially in the right hepatic lobe segments 5 through 8.
Masses in segments 2 through 4 are all similar or slightly decreased
in size, bulk and signal intensity. Limited evaluation without IV
contrast. Cholelithiasis and sludge. No gallbladder wall thickening
or pericholecystic edema. No biliary ductal dilatation.

Pancreas: No mass, inflammatory changes, or other parenchymal
abnormality identified.

Spleen:  Within normal limits in size and appearance.

Adrenals/Urinary Tract: No masses identified. No evidence of
hydronephrosis.

Stomach/Bowel: No evidence of bowel obstruction.

Vascular/Lymphatic: No pathologically enlarged lymph nodes
identified. No abdominal aortic aneurysm demonstrated.

Other: Central mesenteric mass again visualized measuring 2.7 x 2
cm, not significantly changed.

Musculoskeletal: No suspicious bone lesions identified.
IMPRESSION: 1. Innumerable hepatic masses are again seen consistent with
metastases. The masses have decreased in size and overall bulk as
well as diminished T2 and DWI signal, especially in the right
hepatic lobe segments 5 through 8. Limited evaluation without
contrast.
2. Stable central mesenteric mass measuring 2.7 x 2 cm.

## 2021-08-16 NOTE — Progress Notes (Signed)
Per pt he wished to forgo contrast administration. Pt stated he was currently having some kidney issues and his GFR was hovering around 45. MRI contrast was not administered per the patients request. He did state that he might be back for another scan in a month or so and maybe he would do it then.  ?

## 2021-08-17 NOTE — Telephone Encounter (Signed)
Can you follow up to make sur he gets an appt soon? ? ?08/17/21 ?

## 2021-08-18 ENCOUNTER — Telehealth: Payer: Self-pay

## 2021-08-18 ENCOUNTER — Telehealth: Payer: Self-pay | Admitting: *Deleted

## 2021-08-18 NOTE — Telephone Encounter (Signed)
Pt verbalized understanding. Inquired about appointments. Appointment dates confirmed Pt stated he can also see them in mychart. ?

## 2021-08-18 NOTE — Telephone Encounter (Signed)
Notified Mr. Pucci that this week lab/MRI have been sent to Dr. Franco Nones via fax [phone]. Requested radiology push over imaging to them. ?He is aware of his appointment with Dr. Zenda Alpers on 08/23/21 at 0800 at [facility]. ?

## 2021-08-18 NOTE — Telephone Encounter (Signed)
-----   Message from Ladell Pier, MD sent at 08/17/2021  8:08 PM EDT ----- ?Please call patient, liver metastases appear improved, f/u as scheduled ? ?

## 2021-08-23 DIAGNOSIS — N1832 Chronic kidney disease, stage 3b: Secondary | ICD-10-CM | POA: Diagnosis not present

## 2021-08-23 DIAGNOSIS — I129 Hypertensive chronic kidney disease with stage 1 through stage 4 chronic kidney disease, or unspecified chronic kidney disease: Secondary | ICD-10-CM | POA: Diagnosis not present

## 2021-08-23 DIAGNOSIS — N179 Acute kidney failure, unspecified: Secondary | ICD-10-CM | POA: Diagnosis not present

## 2021-08-23 DIAGNOSIS — E039 Hypothyroidism, unspecified: Secondary | ICD-10-CM | POA: Diagnosis not present

## 2021-08-29 ENCOUNTER — Telehealth: Payer: Self-pay | Admitting: *Deleted

## 2021-08-29 ENCOUNTER — Encounter: Payer: Self-pay | Admitting: Oncology

## 2021-08-29 NOTE — Telephone Encounter (Signed)
Received faxed orders for following labs to be drawn for [facility] in [place]: ?CBC/diff ?CMP ?5HIAA Plasma (send to [facility] in Z tube) ?Serotonin ?Pancreastatin (send to [facility] in [facility] tube) ?[facility] ([facility]) at [phone] to inquire--this facility would need to special order tubes from them and then ship sample. Would need to register and be contracted with their lab. Was able to confirm that [facility] is contracted with them and could do these labs. ?Left message with Mr. Maye with this information and requested he call or send MyChart message in how to get these orders to him to take to [facility] or to send to [facility] to be drawn. ?

## 2021-08-30 ENCOUNTER — Inpatient Hospital Stay: Payer: Federal, State, Local not specified - PPO

## 2021-08-30 ENCOUNTER — Other Ambulatory Visit: Payer: Self-pay

## 2021-08-30 DIAGNOSIS — D3A098 Benign carcinoid tumors of other sites: Secondary | ICD-10-CM

## 2021-08-30 DIAGNOSIS — R197 Diarrhea, unspecified: Secondary | ICD-10-CM | POA: Diagnosis not present

## 2021-08-30 DIAGNOSIS — C7B02 Secondary carcinoid tumors of liver: Secondary | ICD-10-CM | POA: Diagnosis not present

## 2021-08-30 DIAGNOSIS — C7A098 Malignant carcinoid tumors of other sites: Secondary | ICD-10-CM | POA: Diagnosis not present

## 2021-08-30 DIAGNOSIS — C7B04 Secondary carcinoid tumors of peritoneum: Secondary | ICD-10-CM | POA: Diagnosis not present

## 2021-08-30 LAB — CBC WITH DIFFERENTIAL (CANCER CENTER ONLY)
Abs Immature Granulocytes: 0.02 10*3/uL (ref 0.00–0.07)
Basophils Absolute: 0.1 10*3/uL (ref 0.0–0.1)
Basophils Relative: 1 %
Eosinophils Absolute: 0.6 10*3/uL — ABNORMAL HIGH (ref 0.0–0.5)
Eosinophils Relative: 12 %
HCT: 34.7 % — ABNORMAL LOW (ref 39.0–52.0)
Hemoglobin: 12.2 g/dL — ABNORMAL LOW (ref 13.0–17.0)
Immature Granulocytes: 0 %
Lymphocytes Relative: 21 %
Lymphs Abs: 1.1 10*3/uL (ref 0.7–4.0)
MCH: 31.8 pg (ref 26.0–34.0)
MCHC: 35.2 g/dL (ref 30.0–36.0)
MCV: 90.4 fL (ref 80.0–100.0)
Monocytes Absolute: 0.5 10*3/uL (ref 0.1–1.0)
Monocytes Relative: 10 %
Neutro Abs: 2.9 10*3/uL (ref 1.7–7.7)
Neutrophils Relative %: 56 %
Platelet Count: 163 10*3/uL (ref 150–400)
RBC: 3.84 MIL/uL — ABNORMAL LOW (ref 4.22–5.81)
RDW: 13.2 % (ref 11.5–15.5)
WBC Count: 5.1 10*3/uL (ref 4.0–10.5)
nRBC: 0 % (ref 0.0–0.2)

## 2021-08-30 LAB — CMP (CANCER CENTER ONLY)
ALT: 28 U/L (ref 0–44)
AST: 25 U/L (ref 15–41)
Albumin: 4.5 g/dL (ref 3.5–5.0)
Alkaline Phosphatase: 136 U/L — ABNORMAL HIGH (ref 38–126)
Anion gap: 7 (ref 5–15)
BUN: 23 mg/dL — ABNORMAL HIGH (ref 6–20)
CO2: 26 mmol/L (ref 22–32)
Calcium: 10.1 mg/dL (ref 8.9–10.3)
Chloride: 105 mmol/L (ref 98–111)
Creatinine: 1.52 mg/dL — ABNORMAL HIGH (ref 0.61–1.24)
GFR, Estimated: 53 mL/min — ABNORMAL LOW (ref >60)
Glucose, Bld: 81 mg/dL (ref 70–99)
Potassium: 4.3 mmol/L (ref 3.5–5.1)
Sodium: 138 mmol/L (ref 135–145)
Total Bilirubin: 0.8 mg/dL (ref 0.3–1.2)
Total Protein: 7.6 g/dL (ref 6.5–8.1)

## 2021-09-01 DIAGNOSIS — C7B8 Other secondary neuroendocrine tumors: Secondary | ICD-10-CM | POA: Diagnosis not present

## 2021-09-05 ENCOUNTER — Inpatient Hospital Stay: Payer: Federal, State, Local not specified - PPO | Admitting: Oncology

## 2021-09-05 ENCOUNTER — Inpatient Hospital Stay: Payer: Federal, State, Local not specified - PPO

## 2021-09-05 ENCOUNTER — Inpatient Hospital Stay: Payer: Federal, State, Local not specified - PPO | Attending: Nurse Practitioner

## 2021-09-05 VITALS — BP 130/98 | HR 66 | Temp 97.7°F | Resp 18 | Ht 71.0 in | Wt 205.4 lb

## 2021-09-05 DIAGNOSIS — C7B02 Secondary carcinoid tumors of liver: Secondary | ICD-10-CM | POA: Insufficient documentation

## 2021-09-05 DIAGNOSIS — R978 Other abnormal tumor markers: Secondary | ICD-10-CM | POA: Diagnosis not present

## 2021-09-05 DIAGNOSIS — C7A098 Malignant carcinoid tumors of other sites: Secondary | ICD-10-CM | POA: Insufficient documentation

## 2021-09-05 DIAGNOSIS — I252 Old myocardial infarction: Secondary | ICD-10-CM | POA: Insufficient documentation

## 2021-09-05 DIAGNOSIS — I2510 Atherosclerotic heart disease of native coronary artery without angina pectoris: Secondary | ICD-10-CM | POA: Insufficient documentation

## 2021-09-05 DIAGNOSIS — C7B04 Secondary carcinoid tumors of peritoneum: Secondary | ICD-10-CM | POA: Diagnosis not present

## 2021-09-05 DIAGNOSIS — E039 Hypothyroidism, unspecified: Secondary | ICD-10-CM | POA: Insufficient documentation

## 2021-09-05 DIAGNOSIS — I10 Essential (primary) hypertension: Secondary | ICD-10-CM | POA: Diagnosis not present

## 2021-09-05 DIAGNOSIS — D3A098 Benign carcinoid tumors of other sites: Secondary | ICD-10-CM

## 2021-09-05 MED ORDER — LANREOTIDE ACETATE 120 MG/0.5ML ~~LOC~~ SOLN
120.0000 mg | Freq: Once | SUBCUTANEOUS | Status: AC
  Administered 2021-09-05: 120 mg via SUBCUTANEOUS
  Filled 2021-09-05: qty 120

## 2021-09-05 NOTE — Progress Notes (Signed)
?William Hancock ?OFFICE PROGRESS NOTE ? ? ?Diagnosis: Carcinoid tumor ? ?INTERVAL HISTORY:  ? ?William Hancock returns as scheduled.  He underwent a bland embolization to the right liver on 07/19/2021.  This was complicated by acute kidney injury, fever, lung consolidation.  He completed a course of Levaquin. ? ?He has been followed by nephrology since his return to [place].  The renal function has improved.  He feels well.  He has noted improvement in her generally level and the stool is more firm since undergoing the right liver embolization procedure.  He is scheduled for left liver embolization in June. ? ?He continues monthly lanreotide. ? ?Objective: ? ?Vital signs in last 24 hours: ? ?Blood pressure (!) 130/98, pulse 66, temperature 97.7 ?F (36.5 ?C), temperature source Oral, resp. rate 18, height $RemoveBe'5\' 11"'dqIKXwmOy$  (1.803 m), weight 205 lb 6.4 oz (93.2 kg), SpO2 100 %. ?  ? ?Resp: Lungs clear bilaterally ?Cardio: Regular rate and rhythm ?GI: Nontender, no hepatosplenomegaly, no mass ?Vascular: No leg edema  ? ? ?Lab Results: ? ?Lab Results  ?Component Value Date  ? WBC 5.1 08/30/2021  ? HGB 12.2 (L) 08/30/2021  ? HCT 34.7 (L) 08/30/2021  ? MCV 90.4 08/30/2021  ? PLT 163 08/30/2021  ? NEUTROABS 2.9 08/30/2021  ? ? ?CMP  ?Lab Results  ?Component Value Date  ? NA 138 08/30/2021  ? K 4.3 08/30/2021  ? CL 105 08/30/2021  ? CO2 26 08/30/2021  ? GLUCOSE 81 08/30/2021  ? BUN 23 (H) 08/30/2021  ? CREATININE 1.52 (H) 08/30/2021  ? CALCIUM 10.1 08/30/2021  ? PROT 7.6 08/30/2021  ? ALBUMIN 4.5 08/30/2021  ? AST 25 08/30/2021  ? ALT 28 08/30/2021  ? ALKPHOS 136 (H) 08/30/2021  ? BILITOT 0.8 08/30/2021  ? GFRNONAA 53 (L) 08/30/2021  ? GFRAA >60 02/26/2020  ? ? ?No results found for: CEA1, CEA, [id number], CA125 ? ?Lab Results  ?Component Value Date  ? INR 1.0 11/13/2018  ? LABPROT 13.0 11/13/2018  ? ? ?Imaging: ? ?No results found. ? ?Medications: I have reviewed the patient's current  medications. ? ? ?Assessment/Plan: ?Metastatic neuroendocrine tumor, well-differentiated ?10/31/2018 Abdominal ultrasound-numerous nearly confluent heterogeneous masses measuring up to 3.5 cm.   ?11/04/2018 CT abdomen/pelvis-multiple enhancing hepatic lesions and a central partially calcified spiculated mesenteric mass measuring 3.1 x 3.1 cm.   ?11/13/2018 biopsy liver lesion-metastatic well-differentiated neuroendocrine tumor positive for CKAE1-AE3, synaptophysin, chromogranin, CD56 and CDX-2; Ki-67 labeling index less than 3%; TTF-1 negative ?Netspot 12/12/2018- increased activity associated a lobular mesenteric mass, small bowel medial to the mesenteric mass, and multiple liver lesions ?Elevated chromogranin A level and 24-hour urine 5 HIAA ?Monthly lanreotide beginning 12/23/2018 ?CT 05/13/2019-multiple large hepatic metastases on the dotatate are poorly visualized, several small lesions are decreased in size 1 new lesion, stable mesenteric mass ?Netspot 09/04/2019-increase in size of left and right liver lesions, stable to slightly decreased radiotracer accumulation, stable central mesenteric mass, no new metastatic lesions ?Lutathera 10/13/2019 ?Lutathera 12/04/2019 ?Lutathera 01/29/2020 ?Lutathera 03/25/2020 ?Restaging Netspot 04/22/2020-overall positive measurable response to therapy.  Multiple hepatic metastasis continue to demonstrate intense radiotracer activity, the activity is consistently decreased from preradiotherapy scan.  No evidence of progressive disease in the liver.  Central mesenteric metastasis and adjacent small bowel lesion have very similar metabolic activity and no change in size.  No evidence of new peritoneal metastasis or skeletal metastasis. ?Monthly lanreotide continued ?Netspot 03/02/2021-stable radiotracer avid confluent left and right liver lesions, stable central mesenteric nodal metastasis, no new sites of  metastatic disease ?Monthly lanreotide continued ?MRI abdomen 05/22/2021-innumerable  confluent contrast-enhancing liver lesions-not significantly changed compared to the PET/CT, stable mesenteric metastasis ?07/19/2021-bland embolization of right hepatic metastases with micro particles in [place] ?History of MI/stent placement ?CAD ?Hypertension ?Renal insufficiency ?Hypothyroidism diagnosed with markedly elevated TSH and low T4 on 12/18/2018 ?Acute left elbow/right ankle pain and swelling August 2021 ?February 2023-acute renal failure following liver embolization procedure, improved ?  ? ? ? ?Disposition: ?William Hancock appears well.  He underwent planned embolization of the right liver in February.  This was complicated by acute renal failure.  The renal function has improved.  He is followed by nephrology.  He plans to return to [place] for embolization of the left liver in June.  He continues monthly lanreotide.  We will follow-up on the chromogranin A level from today.  He will return for an office visit in June. ? ?Betsy Coder, MD ? ?09/05/2021  ?8:39 AM ? ? ?

## 2021-09-05 NOTE — Patient Instructions (Signed)
Lanreotide injection °What is this medication? °LANREOTIDE (lan REE oh tide) is used to reduce blood levels of growth hormone in patients with a condition called acromegaly. It also works to slow or stop tumor growth in patients with neuroendocrine tumors and treat carcinoid syndrome. °This medicine may be used for other purposes; ask your health care provider or pharmacist if you have questions. °COMMON BRAND NAME(S): Somatuline Depot °What should I tell my care team before I take this medication? °They need to know if you have any of these conditions: °diabetes °gallbladder disease °heart disease °kidney disease °liver disease °thyroid disease °an unusual or allergic reaction to lanreotide, other medicines, foods, dyes, or preservatives °pregnant or trying to get pregnant °breast-feeding °How should I use this medication? °This medicine is for injection under the skin. It is given by a health care professional in a hospital or clinic setting. °Contact your pediatrician or health care professional regarding the use of this medicine in children. Special care may be needed. °Overdosage: If you think you have taken too much of this medicine contact a poison control center or emergency room at once. °NOTE: This medicine is only for you. Do not share this medicine with others. °What if I miss a dose? °It is important not to miss your dose. Call your doctor or health care professional if you are unable to keep an appointment. °What may interact with this medication? °This medicine may interact with the following medications: °bromocriptine °cyclosporine °certain medicines for blood pressure, heart disease, irregular heart beat °certain medicines for diabetes °quinidine °terfenadine °This list may not describe all possible interactions. Give your health care provider a list of all the medicines, herbs, non-prescription drugs, or dietary supplements you use. Also tell them if you smoke, drink alcohol, or use illegal drugs.  Some items may interact with your medicine. °What should I watch for while using this medication? °Tell your doctor or healthcare professional if your symptoms do not start to get better or if they get worse. °Visit your doctor or health care professional for regular checks on your progress. Your condition will be monitored carefully while you are receiving this medicine. °This medicine may increase blood sugar. Ask your healthcare provider if changes in diet or medicines are needed if you have diabetes. °You may need blood work done while you are taking this medicine. °Women should inform their doctor if they wish to become pregnant or think they might be pregnant. There is a potential for serious side effects to an unborn child. Talk to your health care professional or pharmacist for more information. Do not breast-feed an infant while taking this medicine or for 6 months after stopping it. °This medicine has caused ovarian failure in some women. This medicine may interfere with the ability to have a child. Talk with your doctor or health care professional if you are concerned about your fertility. °What side effects may I notice from receiving this medication? °Side effects that you should report to your doctor or health care professional as soon as possible: °allergic reactions like skin rash, itching or hives, swelling of the face, lips, or tongue °increased blood pressure °severe stomach pain °signs and symptoms of hgh blood sugar such as being more thirsty or hungry or having to urinate more than normal. You may also feel very tired or have blurry vision. °signs and symptoms of low blood sugar such as feeling anxious; confusion; dizziness; increased hunger; unusually weak or tired; sweating; shakiness; cold; irritable; headache; blurred vision; fast   heartbeat; loss of consciousness °unusually slow heartbeat °Side effects that usually do not require medical attention (report to your doctor or health care  professional if they continue or are bothersome): °constipation °diarrhea °dizziness °headache °muscle pain °muscle spasms °nausea °pain, redness, or irritation at site where injected °This list may not describe all possible side effects. Call your doctor for medical advice about side effects. You may report side effects to FDA at 1-800-FDA-1088. °Where should I keep my medication? °This drug is given in a hospital or clinic and will not be stored at home. °NOTE: This sheet is a summary. It may not cover all possible information. If you have questions about this medicine, talk to your doctor, pharmacist, or health care provider. °© 2022 Elsevier/Gold Standard (2018-04-09 00:00:00) ° °

## 2021-09-07 LAB — CHROMOGRANIN A: Chromogranin A (ng/mL): 1195 ng/mL — ABNORMAL HIGH (ref 0.0–101.8)

## 2021-10-03 ENCOUNTER — Inpatient Hospital Stay: Payer: Federal, State, Local not specified - PPO | Attending: Nurse Practitioner

## 2021-10-03 VITALS — BP 136/92 | HR 63 | Temp 98.1°F | Resp 18

## 2021-10-03 DIAGNOSIS — C7B02 Secondary carcinoid tumors of liver: Secondary | ICD-10-CM | POA: Insufficient documentation

## 2021-10-03 DIAGNOSIS — C7A098 Malignant carcinoid tumors of other sites: Secondary | ICD-10-CM | POA: Insufficient documentation

## 2021-10-03 DIAGNOSIS — C7B04 Secondary carcinoid tumors of peritoneum: Secondary | ICD-10-CM | POA: Diagnosis not present

## 2021-10-03 DIAGNOSIS — D3A098 Benign carcinoid tumors of other sites: Secondary | ICD-10-CM

## 2021-10-03 MED ORDER — LANREOTIDE ACETATE 120 MG/0.5ML ~~LOC~~ SOLN
120.0000 mg | Freq: Once | SUBCUTANEOUS | Status: AC
  Administered 2021-10-03: 120 mg via SUBCUTANEOUS
  Filled 2021-10-03: qty 120

## 2021-10-03 NOTE — Patient Instructions (Signed)
Lanreotide injection What is this medication? LANREOTIDE (lan REE oh tide) is used to reduce blood levels of growth hormone in patients with a condition called acromegaly. It also works to slow or stop tumor growth in patients with neuroendocrine tumors and treat carcinoid syndrome. This medicine may be used for other purposes; ask your health care provider or pharmacist if you have questions. COMMON BRAND NAME(S): Somatuline Depot What should I tell my care team before I take this medication? They need to know if you have any of these conditions: diabetes gallbladder disease heart disease kidney disease liver disease thyroid disease an unusual or allergic reaction to lanreotide, other medicines, foods, dyes, or preservatives pregnant or trying to get pregnant breast-feeding How should I use this medication? This medicine is for injection under the skin. It is given by a health care professional in a hospital or clinic setting. Contact your pediatrician or health care professional regarding the use of this medicine in children. Special care may be needed. Overdosage: If you think you have taken too much of this medicine contact a poison control center or emergency room at once. NOTE: This medicine is only for you. Do not share this medicine with others. What if I miss a dose? It is important not to miss your dose. Call your doctor or health care professional if you are unable to keep an appointment. What may interact with this medication? This medicine may interact with the following medications: bromocriptine cyclosporine certain medicines for blood pressure, heart disease, irregular heart beat certain medicines for diabetes quinidine terfenadine This list may not describe all possible interactions. Give your health care provider a list of all the medicines, herbs, non-prescription drugs, or dietary supplements you use. Also tell them if you smoke, drink alcohol, or use illegal drugs.  Some items may interact with your medicine. What should I watch for while using this medication? Tell your doctor or healthcare professional if your symptoms do not start to get better or if they get worse. Visit your doctor or health care professional for regular checks on your progress. Your condition will be monitored carefully while you are receiving this medicine. This medicine may increase blood sugar. Ask your healthcare provider if changes in diet or medicines are needed if you have diabetes. You may need blood work done while you are taking this medicine. Women should inform their doctor if they wish to become pregnant or think they might be pregnant. There is a potential for serious side effects to an unborn child. Talk to your health care professional or pharmacist for more information. Do not breast-feed an infant while taking this medicine or for 6 months after stopping it. This medicine has caused ovarian failure in some women. This medicine may interfere with the ability to have a child. Talk with your doctor or health care professional if you are concerned about your fertility. What side effects may I notice from receiving this medication? Side effects that you should report to your doctor or health care professional as soon as possible: allergic reactions like skin rash, itching or hives, swelling of the face, lips, or tongue increased blood pressure severe stomach pain signs and symptoms of hgh blood sugar such as being more thirsty or hungry or having to urinate more than normal. You may also feel very tired or have blurry vision. signs and symptoms of low blood sugar such as feeling anxious; confusion; dizziness; increased hunger; unusually weak or tired; sweating; shakiness; cold; irritable; headache; blurred vision; fast   heartbeat; loss of consciousness unusually slow heartbeat Side effects that usually do not require medical attention (report to your doctor or health care  professional if they continue or are bothersome): constipation diarrhea dizziness headache muscle pain muscle spasms nausea pain, redness, or irritation at site where injected This list may not describe all possible side effects. Call your doctor for medical advice about side effects. You may report side effects to FDA at 1-800-FDA-1088. Where should I keep my medication? This drug is given in a hospital or clinic and will not be stored at home. NOTE: This sheet is a summary. It may not cover all possible information. If you have questions about this medicine, talk to your doctor, pharmacist, or health care provider.  2023 Elsevier/Gold Standard (2018-04-09 00:00:00)  

## 2021-10-31 ENCOUNTER — Inpatient Hospital Stay: Payer: Federal, State, Local not specified - PPO

## 2021-10-31 VITALS — BP 140/91 | HR 60 | Temp 98.6°F | Resp 20 | Ht 71.0 in | Wt 213.5 lb

## 2021-10-31 DIAGNOSIS — D3A098 Benign carcinoid tumors of other sites: Secondary | ICD-10-CM

## 2021-10-31 DIAGNOSIS — C7A098 Malignant carcinoid tumors of other sites: Secondary | ICD-10-CM | POA: Diagnosis not present

## 2021-10-31 DIAGNOSIS — C7B02 Secondary carcinoid tumors of liver: Secondary | ICD-10-CM | POA: Diagnosis not present

## 2021-10-31 DIAGNOSIS — C7B04 Secondary carcinoid tumors of peritoneum: Secondary | ICD-10-CM | POA: Diagnosis not present

## 2021-10-31 MED ORDER — LANREOTIDE ACETATE 120 MG/0.5ML ~~LOC~~ SOLN
120.0000 mg | Freq: Once | SUBCUTANEOUS | Status: AC
  Administered 2021-10-31: 120 mg via SUBCUTANEOUS

## 2021-10-31 NOTE — Patient Instructions (Signed)
Lanreotide injection What is this medication? LANREOTIDE (lan REE oh tide) is used to reduce blood levels of growth hormone in patients with a condition called acromegaly. It also works to slow or stop tumor growth in patients with neuroendocrine tumors and treat carcinoid syndrome. This medicine may be used for other purposes; ask your health care provider or pharmacist if you have questions. COMMON BRAND NAME(S): Somatuline Depot What should I tell my care team before I take this medication? They need to know if you have any of these conditions: diabetes gallbladder disease heart disease kidney disease liver disease thyroid disease an unusual or allergic reaction to lanreotide, other medicines, foods, dyes, or preservatives pregnant or trying to get pregnant breast-feeding How should I use this medication? This medicine is for injection under the skin. It is given by a health care professional in a hospital or clinic setting. Contact your pediatrician or health care professional regarding the use of this medicine in children. Special care may be needed. Overdosage: If you think you have taken too much of this medicine contact a poison control center or emergency room at once. NOTE: This medicine is only for you. Do not share this medicine with others. What if I miss a dose? It is important not to miss your dose. Call your doctor or health care professional if you are unable to keep an appointment. What may interact with this medication? This medicine may interact with the following medications: bromocriptine cyclosporine certain medicines for blood pressure, heart disease, irregular heart beat certain medicines for diabetes quinidine terfenadine This list may not describe all possible interactions. Give your health care provider a list of all the medicines, herbs, non-prescription drugs, or dietary supplements you use. Also tell them if you smoke, drink alcohol, or use illegal drugs.  Some items may interact with your medicine. What should I watch for while using this medication? Tell your doctor or healthcare professional if your symptoms do not start to get better or if they get worse. Visit your doctor or health care professional for regular checks on your progress. Your condition will be monitored carefully while you are receiving this medicine. This medicine may increase blood sugar. Ask your healthcare provider if changes in diet or medicines are needed if you have diabetes. You may need blood work done while you are taking this medicine. Women should inform their doctor if they wish to become pregnant or think they might be pregnant. There is a potential for serious side effects to an unborn child. Talk to your health care professional or pharmacist for more information. Do not breast-feed an infant while taking this medicine or for 6 months after stopping it. This medicine has caused ovarian failure in some women. This medicine may interfere with the ability to have a child. Talk with your doctor or health care professional if you are concerned about your fertility. What side effects may I notice from receiving this medication? Side effects that you should report to your doctor or health care professional as soon as possible: allergic reactions like skin rash, itching or hives, swelling of the face, lips, or tongue increased blood pressure severe stomach pain signs and symptoms of hgh blood sugar such as being more thirsty or hungry or having to urinate more than normal. You may also feel very tired or have blurry vision. signs and symptoms of low blood sugar such as feeling anxious; confusion; dizziness; increased hunger; unusually weak or tired; sweating; shakiness; cold; irritable; headache; blurred vision; fast   heartbeat; loss of consciousness unusually slow heartbeat Side effects that usually do not require medical attention (report to your doctor or health care  professional if they continue or are bothersome): constipation diarrhea dizziness headache muscle pain muscle spasms nausea pain, redness, or irritation at site where injected This list may not describe all possible side effects. Call your doctor for medical advice about side effects. You may report side effects to FDA at 1-800-FDA-1088. Where should I keep my medication? This drug is given in a hospital or clinic and will not be stored at home. NOTE: This sheet is a summary. It may not cover all possible information. If you have questions about this medicine, talk to your doctor, pharmacist, or health care provider.  2023 Elsevier/Gold Standard (2018-04-09 00:00:00)  

## 2021-11-03 DIAGNOSIS — N179 Acute kidney failure, unspecified: Secondary | ICD-10-CM | POA: Diagnosis not present

## 2021-11-03 DIAGNOSIS — N182 Chronic kidney disease, stage 2 (mild): Secondary | ICD-10-CM | POA: Diagnosis not present

## 2021-11-03 DIAGNOSIS — I2510 Atherosclerotic heart disease of native coronary artery without angina pectoris: Secondary | ICD-10-CM | POA: Diagnosis not present

## 2021-11-03 DIAGNOSIS — E785 Hyperlipidemia, unspecified: Secondary | ICD-10-CM | POA: Diagnosis not present

## 2021-11-03 DIAGNOSIS — I129 Hypertensive chronic kidney disease with stage 1 through stage 4 chronic kidney disease, or unspecified chronic kidney disease: Secondary | ICD-10-CM | POA: Diagnosis not present

## 2021-11-04 LAB — LIPID PANEL
Chol/HDL Ratio: 2.5 ratio (ref 0.0–5.0)
Cholesterol, Total: 106 mg/dL (ref 100–199)
HDL: 42 mg/dL (ref >39)
LDL Chol Calc (NIH): 47 mg/dL (ref 0–99)
Triglycerides: 88 mg/dL (ref 0–149)
VLDL Cholesterol Cal: 17 mg/dL (ref 5–40)

## 2021-11-07 ENCOUNTER — Other Ambulatory Visit: Payer: Self-pay | Admitting: *Deleted

## 2021-11-07 DIAGNOSIS — C7B02 Secondary carcinoid tumors of liver: Secondary | ICD-10-CM

## 2021-11-07 DIAGNOSIS — D3A098 Benign carcinoid tumors of other sites: Secondary | ICD-10-CM

## 2021-11-07 NOTE — Progress Notes (Signed)
Received fax from [facility] reporting TACE procedure on 11/21/21 and needs CBC/diff/CMP 1 month afterwards as well as MRI abdomen with and without contrast.  Fax results to [phone] and [employer] images. Orders placed.

## 2021-11-14 NOTE — Progress Notes (Signed)
Subjective:   William Hancock, male    DOB: Oct 23, 1965, 56 y.o.   MRN: [id number]   Chief complaint:  Coronary artery disease   HPI  56 year old Caucasian male with hypertension, hyperlipidemia, coronary artery disease, s/p multivessel complex PCI (pro & mid RCA, prox-mid LAD/Diag bifurcation) for NSTEMI in 06/2018, metastatic neuroendocrine tumor  Patient underwent one surgery in the beginning of 2023-surgical try reduction of liver mets and removal of primary neuroendocrine tumor.  He has part 2 of the surgery soon by Dr. Josetta Huddle.  Patient is doing well from cardiac standpoint, denies any chest pain or shortness of breath symptoms.  Blood pressure slightly elevated today, but generally well controlled.    Current Outpatient Medications:    amLODipine (NORVASC) 10 MG tablet, Take 10 mg by mouth daily., Disp: , Rfl:    aspirin 81 MG chewable tablet, Chew 1 tablet (81 mg total) by mouth daily., Disp: 90 tablet, Rfl: 3   atorvastatin (LIPITOR) 40 MG tablet, Take 1 tablet (40 mg total) by mouth daily., Disp: 90 tablet, Rfl: 3   levothyroxine (SYNTHROID) 125 MCG tablet, Take 125 mcg by mouth daily., Disp: , Rfl:    metoprolol succinate (TOPROL-XL) 25 MG 24 hr tablet, Take 1 tablet (25 mg total) by mouth in the morning and at bedtime., Disp: 180 tablet, Rfl: 3   nitroGLYCERIN (NITROSTAT) 0.4 MG SL tablet, Place 1 tablet (0.4 mg total) under the tongue every 5 (five) minutes x 3 doses as needed for chest pain., Disp: 25 tablet, Rfl: 1   vitamin B-12 (CYANOCOBALAMIN) 500 MCG tablet, Take 1,000 mcg by mouth daily., Disp: , Rfl:   Cardiovascular studies:  EKG 11/15/2021: Sinus bradycardia 48 bpm First degree A-V block   Coronary intervention 06/09/2018: LM: Normal  LAD: Prox to mid LAD/Diag1 bifurcation severe calcific 80% stenosis        Diag 1 mid 75% stenosis        Atherectomy and prox LAD into Diag         Synergy DES 2.75 X 16 mm DES        Overlapping Stent into prox LAD Synergy  DES 3.0 X 38 mm        Provisional stent mid LAD with minicrush technique Synergy DES 2.75 X 16 mm DES Ramus: 50 % proximal disease LCx: Mild luminal irregularities RCA: Stenting performed 06/06/2018        Severe mid 99% stenosis--->Orsero 3.5 X 26 mm Orsero DES         Post dilatation with 4.0 mm [place] balloon        Severe prox 80% stenosis--->Orsero 3.5 X 9 mm DES        Post dilatation with 4.0 mm [place] balloon   Recommendation: DAPT with Aspirin and brilinta for 1 year Aggressive risk factor modification  Hospital echocardiogram 06/06/2018: - Left ventricle: The cavity size was normal. There was mild   concentric hypertrophy. Systolic function was normal. The   estimated ejection fraction was in the range of 55% to 60%. Wall   motion was normal; there were no regional wall motion   abnormalities. Doppler parameters are consistent with abnormal   left ventricular relaxation (grade 1 diastolic dysfunction). - No significant valvular abnormality.  Recent labs: 11/03/2021: Chol 106, TG 88, HDL 42, LDL 47  03/21/2021: Glucose 101, BUN/Cr 22/1.39. EGFR 59. Na/K 140/4.8. T.bili 1.3. Rest of the CMP normal H/H 14/41. MCV 90. Platelets 186  Review of Systems  Review of Systems  Cardiovascular:  Negative for chest pain, dyspnea on exertion, leg swelling, palpitations and syncope.         Vitals:   11/15/21 1050  BP: (!) 142/90  Pulse: (!) 55  Resp: 16  Temp: 98.2 F (36.8 C)  SpO2: 97%     Objective:   Physical Exam  Physical Exam Vitals and nursing note reviewed.  Constitutional:      General: He is not in acute distress.    Appearance: He is well-developed.  Neck:     Vascular: No JVD.  Cardiovascular:     Rate and Rhythm: Normal rate and regular rhythm.     Pulses: Intact distal pulses.     Heart sounds: Normal heart sounds. No murmur heard. Pulmonary:     Effort: Pulmonary effort is normal.     Breath sounds: Normal breath sounds. No wheezing or rales.             Assessment & Recommendations:   56-year-old Caucasian male with hypertension, hyperlipidemia, coronary artery disease, s/p multivessel complex PCI (pro & mid RCA, prox-mid LAD/Diag bifurcation) for NSTEMI in 06/2018, metastatic neuroendocrine tumor  CAD without angina: S/p multilvessel PCI for NSTEMI 06/2018 Doing well without angina symptoms Continue aspirin 81 mg daily.   Okay to hold aspirin 5 days before his upcoming NET surgery. Continue metoprolol succinate, which he prefers taking 25 mg twice daily.   Continue amlodipine 10 mg daily. Continue lipitor 40 mg daily. Will check lipid panel in 11/2021.  Hypertension: As above  Metastatic neuroendocrine tumor: Managed by Dr. Sherrill.  Upcoming surgery in [place].  F/u in 6 months    Manish J Patwardhan, MD Pager: [phone] Office: [phone]   

## 2021-11-15 ENCOUNTER — Encounter: Payer: Self-pay | Admitting: Cardiology

## 2021-11-15 ENCOUNTER — Ambulatory Visit: Payer: Federal, State, Local not specified - PPO | Admitting: Cardiology

## 2021-11-15 VITALS — BP 142/90 | HR 55 | Temp 98.2°F | Resp 16 | Ht 71.0 in | Wt 216.0 lb

## 2021-11-15 DIAGNOSIS — I10 Essential (primary) hypertension: Secondary | ICD-10-CM

## 2021-11-15 DIAGNOSIS — C7A8 Other malignant neuroendocrine tumors: Secondary | ICD-10-CM | POA: Diagnosis not present

## 2021-11-15 DIAGNOSIS — I2510 Atherosclerotic heart disease of native coronary artery without angina pectoris: Secondary | ICD-10-CM | POA: Diagnosis not present

## 2021-11-15 DIAGNOSIS — C7B8 Other secondary neuroendocrine tumors: Secondary | ICD-10-CM | POA: Diagnosis not present

## 2021-11-15 MED ORDER — METOPROLOL SUCCINATE ER 25 MG PO TB24: 25.0000 mg | ORAL_TABLET | Freq: Two times a day (BID) | ORAL | 3 refills | Status: DC

## 2021-11-15 MED ORDER — ATORVASTATIN CALCIUM 40 MG PO TABS: 40.0000 mg | ORAL_TABLET | Freq: Every day | ORAL | 3 refills | Status: DC

## 2021-11-15 MED ORDER — AMLODIPINE BESYLATE 10 MG PO TABS: 10.0000 mg | ORAL_TABLET | Freq: Every day | ORAL | 3 refills | Status: DC

## 2021-11-15 MED ORDER — NITROGLYCERIN 0.4 MG SL SUBL: 0.4000 mg | SUBLINGUAL_TABLET | SUBLINGUAL | 3 refills | Status: DC | PRN

## 2021-11-17 ENCOUNTER — Encounter: Payer: Self-pay | Admitting: Cardiology

## 2021-11-22 ENCOUNTER — Ambulatory Visit: Payer: Federal, State, Local not specified - PPO | Admitting: Cardiology

## 2021-11-27 ENCOUNTER — Encounter: Payer: Self-pay | Admitting: *Deleted

## 2021-11-28 ENCOUNTER — Inpatient Hospital Stay: Payer: Federal, State, Local not specified - PPO | Attending: Nurse Practitioner | Admitting: Oncology

## 2021-11-28 ENCOUNTER — Inpatient Hospital Stay: Payer: Federal, State, Local not specified - PPO

## 2021-11-28 ENCOUNTER — Encounter: Payer: Self-pay | Admitting: Oncology

## 2021-11-28 VITALS — BP 116/91 | HR 94 | Temp 98.2°F | Resp 18 | Ht 71.0 in | Wt 205.4 lb

## 2021-11-28 DIAGNOSIS — I10 Essential (primary) hypertension: Secondary | ICD-10-CM | POA: Insufficient documentation

## 2021-11-28 DIAGNOSIS — D3A098 Benign carcinoid tumors of other sites: Secondary | ICD-10-CM

## 2021-11-28 DIAGNOSIS — C7A098 Malignant carcinoid tumors of other sites: Secondary | ICD-10-CM | POA: Diagnosis not present

## 2021-11-28 DIAGNOSIS — C7B04 Secondary carcinoid tumors of peritoneum: Secondary | ICD-10-CM | POA: Insufficient documentation

## 2021-11-28 DIAGNOSIS — I2510 Atherosclerotic heart disease of native coronary artery without angina pectoris: Secondary | ICD-10-CM | POA: Insufficient documentation

## 2021-11-28 DIAGNOSIS — C7B02 Secondary carcinoid tumors of liver: Secondary | ICD-10-CM | POA: Insufficient documentation

## 2021-11-28 DIAGNOSIS — E039 Hypothyroidism, unspecified: Secondary | ICD-10-CM | POA: Insufficient documentation

## 2021-11-28 MED ORDER — LANREOTIDE ACETATE 120 MG/0.5ML ~~LOC~~ SOLN
120.0000 mg | Freq: Once | SUBCUTANEOUS | Status: AC
  Administered 2021-11-28: 120 mg via SUBCUTANEOUS

## 2021-11-28 NOTE — Progress Notes (Signed)
[facility] OFFICE PROGRESS NOTE   Diagnosis: Carcinoid tumor  INTERVAL HISTORY:   William Hancock returns as scheduled.  He underwent bland embolization of the left liver in [place] on 11/21/2021.  He reports tolerating the procedure well.  He was hospitalized for several days following the procedure.  He reports postprocedure malaise.  This is improving.  No diarrhea.  He had abdominal discomfort following the procedure.  The pain has improved.  No other complaint.  He is scheduled to have a restaging MRI in July.  He continues monthly lanreotide.  No chest pain or flushing.  Objective:  Vital signs in last 24 hours:  Blood pressure (!) 116/91, pulse 94, temperature 98.2 F (36.8 C), temperature source Oral, resp. rate 18, height 5\' 11"  (1.803 m), weight 205 lb 6.4 oz (93.2 kg), SpO2 98 %.    Resp: Lungs clear bilaterally Cardio: Regular rate and rhythm GI: Fullness in the right upper abdomen without a discrete liver edge, no splenomegaly, nontender Vascular: No leg edema   Lab Results:  Lab Results  Component Value Date   WBC 5.1 08/30/2021   HGB 12.2 (L) 08/30/2021   HCT 34.7 (L) 08/30/2021   MCV 90.4 08/30/2021   PLT 163 08/30/2021   NEUTROABS 2.9 08/30/2021    CMP  Lab Results  Component Value Date   NA 138 08/30/2021   K 4.3 08/30/2021   CL 105 08/30/2021   CO2 26 08/30/2021   GLUCOSE 81 08/30/2021   BUN 23 (H) 08/30/2021   CREATININE 1.52 (H) 08/30/2021   CALCIUM 10.1 08/30/2021   PROT 7.6 08/30/2021   ALBUMIN 4.5 08/30/2021   AST 25 08/30/2021   ALT 28 08/30/2021   ALKPHOS 136 (H) 08/30/2021   BILITOT 0.8 08/30/2021   GFRNONAA 53 (L) 08/30/2021   GFRAA >60 02/26/2020    Medications: I have reviewed the patient's current medications.   Assessment/Plan: Metastatic neuroendocrine tumor, well-differentiated 10/31/2018 Abdominal ultrasound-numerous nearly confluent heterogeneous masses measuring up to 3.5 cm.   11/04/2018 CT  abdomen/pelvis-multiple enhancing hepatic lesions and a central partially calcified spiculated mesenteric mass measuring 3.1 x 3.1 cm.   11/13/2018 biopsy liver lesion-metastatic well-differentiated neuroendocrine tumor positive for CKAE1-AE3, synaptophysin, chromogranin, CD56 and CDX-2; Ki-67 labeling index less than 3%; TTF-1 negative Netspot 12/12/2018- increased activity associated a lobular mesenteric mass, small bowel medial to the mesenteric mass, and multiple liver lesions Elevated chromogranin A level and 24-hour urine 5 HIAA Monthly lanreotide beginning 12/23/2018 CT 05/13/2019-multiple large hepatic metastases on the dotatate are poorly visualized, several small lesions are decreased in size 1 new lesion, stable mesenteric mass Netspot 09/04/2019-increase in size of left and right liver lesions, stable to slightly decreased radiotracer accumulation, stable central mesenteric mass, no new metastatic lesions Lutathera 10/13/2019 Lutathera 12/04/2019 Lutathera 01/29/2020 Lutathera 03/25/2020 Restaging Netspot 04/22/2020-overall positive measurable response to therapy.  Multiple hepatic metastasis continue to demonstrate intense radiotracer activity, the activity is consistently decreased from preradiotherapy scan.  No evidence of progressive disease in the liver.  Central mesenteric metastasis and adjacent small bowel lesion have very similar metabolic activity and no change in size.  No evidence of new peritoneal metastasis or skeletal metastasis. Monthly lanreotide continued Netspot 03/02/2021-stable radiotracer avid confluent left and right liver lesions, stable central mesenteric nodal metastasis, no new sites of metastatic disease Monthly lanreotide continued MRI abdomen 05/22/2021-innumerable confluent contrast-enhancing liver lesions-not significantly changed compared to the PET/CT, stable mesenteric metastasis 07/19/2021-bland embolization of right hepatic metastases with micro particles in  [place] 08/16/2021-MRI  abdomen without contrast-innumerable hepatic masses, decreased in size and overall bulk, especially in the right hepatic segments 5-8.  Stable central mesenteric mass 11/20/2021-dotatate PET-confluent radiotracer uptake at the right hepatic dome, elongated area of radiotracer uptake at the anterior superior left hepatic lobe, small focus of uptake at the caudate lobe, radiotracer uptake associated with a partially calcified mesenteric mass and adjacent small bowel loop, no new sites of metastatic disease 11/21/2021-bland bleed/particle embolization of the left hepatic artery History of MI/stent placement CAD Hypertension Renal insufficiency Hypothyroidism diagnosed with markedly elevated TSH and low T4 on 12/18/2018 Acute left elbow/right ankle pain and swelling August 2021 February 2023-acute renal failure following liver embolization procedure, improved       Disposition: William Hancock appears stable.  He underwent bland embolization of the left liver on 11/21/2021.  He appears to have tolerated procedure well.  He will return for a CBC and chemistry panel next week.  He continues monthly lanreotide.  He will be scheduled for a restaging liver MRI in July.  He will return for an office visit in 2 months.  Thornton Papas, MD  11/28/2021  8:36 AM

## 2021-11-29 ENCOUNTER — Other Ambulatory Visit: Payer: Self-pay | Admitting: *Deleted

## 2021-11-29 ENCOUNTER — Ambulatory Visit
Admission: RE | Admit: 2021-11-29 | Discharge: 2021-11-29 | Disposition: A | Discharge status: Home / Self Care | Payer: Self-pay | Source: Ambulatory Visit | Attending: Oncology | Admitting: Oncology

## 2021-11-29 DIAGNOSIS — C7B02 Secondary carcinoid tumors of liver: Secondary | ICD-10-CM

## 2021-11-29 NOTE — Progress Notes (Signed)
PET/CT CD from [facility] sent to [facility] via interoffice mail to be loaded into system.

## 2021-12-04 DIAGNOSIS — Z08 Encounter for follow-up examination after completed treatment for malignant neoplasm: Secondary | ICD-10-CM | POA: Diagnosis not present

## 2021-12-04 DIAGNOSIS — L578 Other skin changes due to chronic exposure to nonionizing radiation: Secondary | ICD-10-CM | POA: Diagnosis not present

## 2021-12-04 DIAGNOSIS — Z85828 Personal history of other malignant neoplasm of skin: Secondary | ICD-10-CM | POA: Diagnosis not present

## 2021-12-04 DIAGNOSIS — L570 Actinic keratosis: Secondary | ICD-10-CM | POA: Diagnosis not present

## 2021-12-06 ENCOUNTER — Telehealth: Payer: Self-pay | Admitting: *Deleted

## 2021-12-06 NOTE — Telephone Encounter (Signed)
Mr. Hoffer notified of MRI at [facility] on 12/22/18 at 0930/1000. NPO 4 hours prior.

## 2021-12-08 ENCOUNTER — Inpatient Hospital Stay: Payer: Federal, State, Local not specified - PPO | Attending: Nurse Practitioner

## 2021-12-08 DIAGNOSIS — C7A098 Malignant carcinoid tumors of other sites: Secondary | ICD-10-CM | POA: Insufficient documentation

## 2021-12-08 DIAGNOSIS — C7B02 Secondary carcinoid tumors of liver: Secondary | ICD-10-CM | POA: Insufficient documentation

## 2021-12-08 DIAGNOSIS — C7B04 Secondary carcinoid tumors of peritoneum: Secondary | ICD-10-CM | POA: Insufficient documentation

## 2021-12-08 DIAGNOSIS — D3A098 Benign carcinoid tumors of other sites: Secondary | ICD-10-CM

## 2021-12-08 LAB — CBC WITH DIFFERENTIAL (CANCER CENTER ONLY)
Abs Immature Granulocytes: 0.04 10*3/uL (ref 0.00–0.07)
Basophils Absolute: 0.1 10*3/uL (ref 0.0–0.1)
Basophils Relative: 1 %
Eosinophils Absolute: 0.1 10*3/uL (ref 0.0–0.5)
Eosinophils Relative: 1 %
HCT: 38.6 % — ABNORMAL LOW (ref 39.0–52.0)
Hemoglobin: 13 g/dL (ref 13.0–17.0)
Immature Granulocytes: 1 %
Lymphocytes Relative: 19 %
Lymphs Abs: 1.2 10*3/uL (ref 0.7–4.0)
MCH: 31.3 pg (ref 26.0–34.0)
MCHC: 33.7 g/dL (ref 30.0–36.0)
MCV: 93 fL (ref 80.0–100.0)
Monocytes Absolute: 0.5 10*3/uL (ref 0.1–1.0)
Monocytes Relative: 8 %
Neutro Abs: 4.4 10*3/uL (ref 1.7–7.7)
Neutrophils Relative %: 70 %
Platelet Count: 509 10*3/uL — ABNORMAL HIGH (ref 150–400)
RBC: 4.15 MIL/uL — ABNORMAL LOW (ref 4.22–5.81)
RDW: 12.6 % (ref 11.5–15.5)
WBC Count: 6.3 10*3/uL (ref 4.0–10.5)
nRBC: 0 % (ref 0.0–0.2)

## 2021-12-08 LAB — CMP (CANCER CENTER ONLY)
ALT: 44 U/L (ref 0–44)
AST: 35 U/L (ref 15–41)
Albumin: 4.2 g/dL (ref 3.5–5.0)
Alkaline Phosphatase: 442 U/L — ABNORMAL HIGH (ref 38–126)
Anion gap: 9 (ref 5–15)
BUN: 23 mg/dL — ABNORMAL HIGH (ref 6–20)
CO2: 26 mmol/L (ref 22–32)
Calcium: 10.3 mg/dL (ref 8.9–10.3)
Chloride: 105 mmol/L (ref 98–111)
Creatinine: 1.51 mg/dL — ABNORMAL HIGH (ref 0.61–1.24)
GFR, Estimated: 53 mL/min — ABNORMAL LOW (ref >60)
Glucose, Bld: 98 mg/dL (ref 70–99)
Potassium: 4.9 mmol/L (ref 3.5–5.1)
Sodium: 140 mmol/L (ref 135–145)
Total Bilirubin: 0.6 mg/dL (ref 0.3–1.2)
Total Protein: 7.8 g/dL (ref 6.5–8.1)

## 2021-12-14 ENCOUNTER — Ambulatory Visit
Admission: RE | Admit: 2021-12-14 | Discharge: 2021-12-14 | Disposition: A | Discharge status: Home / Self Care | Payer: Self-pay | Source: Ambulatory Visit | Attending: Oncology | Admitting: Oncology

## 2021-12-14 ENCOUNTER — Other Ambulatory Visit: Payer: Self-pay | Admitting: *Deleted

## 2021-12-14 DIAGNOSIS — C7B02 Secondary carcinoid tumors of liver: Secondary | ICD-10-CM

## 2021-12-14 NOTE — Progress Notes (Signed)
outside

## 2021-12-15 ENCOUNTER — Telehealth: Payer: Self-pay | Admitting: *Deleted

## 2021-12-15 NOTE — Telephone Encounter (Signed)
Opened in error

## 2021-12-18 ENCOUNTER — Telehealth: Payer: Self-pay | Admitting: Oncology

## 2021-12-18 NOTE — Telephone Encounter (Signed)
Attempted to contact patient in regards to voicemail that was left by patient. No answer so voicemail was left for patient to call back if he has anymore questions

## 2021-12-19 ENCOUNTER — Inpatient Hospital Stay: Payer: Federal, State, Local not specified - PPO

## 2021-12-20 ENCOUNTER — Inpatient Hospital Stay: Payer: Federal, State, Local not specified - PPO

## 2021-12-20 DIAGNOSIS — C7B02 Secondary carcinoid tumors of liver: Secondary | ICD-10-CM | POA: Diagnosis not present

## 2021-12-20 DIAGNOSIS — D3A098 Benign carcinoid tumors of other sites: Secondary | ICD-10-CM

## 2021-12-20 DIAGNOSIS — C7B04 Secondary carcinoid tumors of peritoneum: Secondary | ICD-10-CM | POA: Diagnosis not present

## 2021-12-20 DIAGNOSIS — C7A098 Malignant carcinoid tumors of other sites: Secondary | ICD-10-CM | POA: Diagnosis not present

## 2021-12-20 LAB — CBC WITH DIFFERENTIAL (CANCER CENTER ONLY)
Abs Immature Granulocytes: 0.04 10*3/uL (ref 0.00–0.07)
Basophils Absolute: 0.1 10*3/uL (ref 0.0–0.1)
Basophils Relative: 2 %
Eosinophils Absolute: 0.5 10*3/uL (ref 0.0–0.5)
Eosinophils Relative: 9 %
HCT: 39.2 % (ref 39.0–52.0)
Hemoglobin: 13.6 g/dL (ref 13.0–17.0)
Immature Granulocytes: 1 %
Lymphocytes Relative: 29 %
Lymphs Abs: 1.4 10*3/uL (ref 0.7–4.0)
MCH: 31.8 pg (ref 26.0–34.0)
MCHC: 34.7 g/dL (ref 30.0–36.0)
MCV: 91.6 fL (ref 80.0–100.0)
Monocytes Absolute: 0.5 10*3/uL (ref 0.1–1.0)
Monocytes Relative: 10 %
Neutro Abs: 2.3 10*3/uL (ref 1.7–7.7)
Neutrophils Relative %: 49 %
Platelet Count: 209 10*3/uL (ref 150–400)
RBC: 4.28 MIL/uL (ref 4.22–5.81)
RDW: 12.6 % (ref 11.5–15.5)
WBC Count: 4.8 10*3/uL (ref 4.0–10.5)
nRBC: 0 % (ref 0.0–0.2)

## 2021-12-20 LAB — CMP (CANCER CENTER ONLY)
ALT: 35 U/L (ref 0–44)
AST: 27 U/L (ref 15–41)
Albumin: 4.5 g/dL (ref 3.5–5.0)
Alkaline Phosphatase: 212 U/L — ABNORMAL HIGH (ref 38–126)
Anion gap: 9 (ref 5–15)
BUN: 18 mg/dL (ref 6–20)
CO2: 28 mmol/L (ref 22–32)
Calcium: 10.2 mg/dL (ref 8.9–10.3)
Chloride: 103 mmol/L (ref 98–111)
Creatinine: 1.46 mg/dL — ABNORMAL HIGH (ref 0.61–1.24)
GFR, Estimated: 55 mL/min — ABNORMAL LOW (ref >60)
Glucose, Bld: 110 mg/dL — ABNORMAL HIGH (ref 70–99)
Potassium: 4.8 mmol/L (ref 3.5–5.1)
Sodium: 140 mmol/L (ref 135–145)
Total Bilirubin: 0.7 mg/dL (ref 0.3–1.2)
Total Protein: 7.5 g/dL (ref 6.5–8.1)

## 2021-12-21 ENCOUNTER — Ambulatory Visit (HOSPITAL_COMMUNITY)
Admission: RE | Admit: 2021-12-21 | Discharge: 2021-12-21 | Disposition: A | Discharge status: Home / Self Care | Payer: Federal, State, Local not specified - PPO | Source: Ambulatory Visit | Attending: Oncology | Admitting: Oncology

## 2021-12-21 ENCOUNTER — Inpatient Hospital Stay: Payer: Federal, State, Local not specified - PPO

## 2021-12-21 DIAGNOSIS — K8020 Calculus of gallbladder without cholecystitis without obstruction: Secondary | ICD-10-CM | POA: Diagnosis not present

## 2021-12-21 DIAGNOSIS — K7689 Other specified diseases of liver: Secondary | ICD-10-CM | POA: Diagnosis not present

## 2021-12-21 DIAGNOSIS — C7B02 Secondary carcinoid tumors of liver: Secondary | ICD-10-CM | POA: Insufficient documentation

## 2021-12-21 DIAGNOSIS — C787 Secondary malignant neoplasm of liver and intrahepatic bile duct: Secondary | ICD-10-CM | POA: Diagnosis not present

## 2021-12-21 DIAGNOSIS — D3A098 Benign carcinoid tumors of other sites: Secondary | ICD-10-CM | POA: Insufficient documentation

## 2021-12-21 DIAGNOSIS — C7A8 Other malignant neuroendocrine tumors: Secondary | ICD-10-CM | POA: Diagnosis not present

## 2021-12-21 MED ORDER — GADOBUTROL 1 MMOL/ML IV SOLN
9.0000 mL | Freq: Once | INTRAVENOUS | Status: AC | PRN
  Administered 2021-12-21: 9 mL via INTRAVENOUS

## 2021-12-22 ENCOUNTER — Encounter: Payer: Self-pay | Admitting: *Deleted

## 2021-12-22 NOTE — Progress Notes (Signed)
Faxed MRI report and labs to Dr. Stanton Kidney Hobbs-Maluccio at [place] [phone] and requested radiology send images via [place] to [facility] in [place], [place].

## 2021-12-26 ENCOUNTER — Inpatient Hospital Stay: Payer: Federal, State, Local not specified - PPO

## 2021-12-26 VITALS — BP 124/90 | HR 67 | Temp 98.1°F | Resp 18 | Ht 71.0 in | Wt 207.6 lb

## 2021-12-26 DIAGNOSIS — C7B02 Secondary carcinoid tumors of liver: Secondary | ICD-10-CM | POA: Diagnosis not present

## 2021-12-26 DIAGNOSIS — C7A098 Malignant carcinoid tumors of other sites: Secondary | ICD-10-CM | POA: Diagnosis not present

## 2021-12-26 DIAGNOSIS — D3A098 Benign carcinoid tumors of other sites: Secondary | ICD-10-CM

## 2021-12-26 DIAGNOSIS — C7B04 Secondary carcinoid tumors of peritoneum: Secondary | ICD-10-CM | POA: Diagnosis not present

## 2021-12-26 MED ORDER — LANREOTIDE ACETATE 120 MG/0.5ML ~~LOC~~ SOLN
120.0000 mg | Freq: Once | SUBCUTANEOUS | Status: AC
  Administered 2021-12-26: 120 mg via SUBCUTANEOUS
  Filled 2021-12-26: qty 120

## 2021-12-26 NOTE — Patient Instructions (Signed)
Lanreotide injection What is this medication? LANREOTIDE (lan REE oh tide) is used to reduce blood levels of growth hormone in patients with a condition called acromegaly. It also works to slow or stop tumor growth in patients with neuroendocrine tumors and treat carcinoid syndrome. This medicine may be used for other purposes; ask your health care provider or pharmacist if you have questions. COMMON BRAND NAME(S): Somatuline Depot What should I tell my care team before I take this medication? They need to know if you have any of these conditions: diabetes gallbladder disease heart disease kidney disease liver disease thyroid disease an unusual or allergic reaction to lanreotide, other medicines, foods, dyes, or preservatives pregnant or trying to get pregnant breast-feeding How should I use this medication? This medicine is for injection under the skin. It is given by a health care professional in a hospital or clinic setting. Contact your pediatrician or health care professional regarding the use of this medicine in children. Special care may be needed. Overdosage: If you think you have taken too much of this medicine contact a poison control center or emergency room at once. NOTE: This medicine is only for you. Do not share this medicine with others. What if I miss a dose? It is important not to miss your dose. Call your doctor or health care professional if you are unable to keep an appointment. What may interact with this medication? This medicine may interact with the following medications: bromocriptine cyclosporine certain medicines for blood pressure, heart disease, irregular heart beat certain medicines for diabetes quinidine terfenadine This list may not describe all possible interactions. Give your health care provider a list of all the medicines, herbs, non-prescription drugs, or dietary supplements you use. Also tell them if you smoke, drink alcohol, or use illegal drugs.  Some items may interact with your medicine. What should I watch for while using this medication? Tell your doctor or healthcare professional if your symptoms do not start to get better or if they get worse. Visit your doctor or health care professional for regular checks on your progress. Your condition will be monitored carefully while you are receiving this medicine. This medicine may increase blood sugar. Ask your healthcare provider if changes in diet or medicines are needed if you have diabetes. You may need blood work done while you are taking this medicine. Women should inform their doctor if they wish to become pregnant or think they might be pregnant. There is a potential for serious side effects to an unborn child. Talk to your health care professional or pharmacist for more information. Do not breast-feed an infant while taking this medicine or for 6 months after stopping it. This medicine has caused ovarian failure in some women. This medicine may interfere with the ability to have a child. Talk with your doctor or health care professional if you are concerned about your fertility. What side effects may I notice from receiving this medication? Side effects that you should report to your doctor or health care professional as soon as possible: allergic reactions like skin rash, itching or hives, swelling of the face, lips, or tongue increased blood pressure severe stomach pain signs and symptoms of hgh blood sugar such as being more thirsty or hungry or having to urinate more than normal. You may also feel very tired or have blurry vision. signs and symptoms of low blood sugar such as feeling anxious; confusion; dizziness; increased hunger; unusually weak or tired; sweating; shakiness; cold; irritable; headache; blurred vision; fast   heartbeat; loss of consciousness unusually slow heartbeat Side effects that usually do not require medical attention (report to your doctor or health care  professional if they continue or are bothersome): constipation diarrhea dizziness headache muscle pain muscle spasms nausea pain, redness, or irritation at site where injected This list may not describe all possible side effects. Call your doctor for medical advice about side effects. You may report side effects to FDA at 1-800-FDA-1088. Where should I keep my medication? This drug is given in a hospital or clinic and will not be stored at home. NOTE: This sheet is a summary. It may not cover all possible information. If you have questions about this medicine, talk to your doctor, pharmacist, or health care provider.  2023 Elsevier/Gold Standard (2018-04-09 00:00:00)  

## 2021-12-27 DIAGNOSIS — C7A095 Malignant carcinoid tumor of the midgut NOS: Secondary | ICD-10-CM | POA: Diagnosis not present

## 2021-12-27 DIAGNOSIS — C7B8 Other secondary neuroendocrine tumors: Secondary | ICD-10-CM | POA: Diagnosis not present

## 2022-01-10 ENCOUNTER — Encounter: Payer: Self-pay | Admitting: Oncology

## 2022-01-11 ENCOUNTER — Encounter: Payer: Self-pay | Admitting: Cardiology

## 2022-01-16 ENCOUNTER — Ambulatory Visit: Payer: Federal, State, Local not specified - PPO

## 2022-01-16 ENCOUNTER — Ambulatory Visit: Payer: Federal, State, Local not specified - PPO | Admitting: Oncology

## 2022-01-19 ENCOUNTER — Inpatient Hospital Stay: Payer: Federal, State, Local not specified - PPO | Attending: Nurse Practitioner

## 2022-01-19 VITALS — BP 135/87 | HR 60 | Temp 98.3°F | Resp 18

## 2022-01-19 DIAGNOSIS — C7B04 Secondary carcinoid tumors of peritoneum: Secondary | ICD-10-CM | POA: Insufficient documentation

## 2022-01-19 DIAGNOSIS — C7A098 Malignant carcinoid tumors of other sites: Secondary | ICD-10-CM | POA: Diagnosis not present

## 2022-01-19 DIAGNOSIS — D3A098 Benign carcinoid tumors of other sites: Secondary | ICD-10-CM

## 2022-01-19 DIAGNOSIS — C7B02 Secondary carcinoid tumors of liver: Secondary | ICD-10-CM | POA: Diagnosis not present

## 2022-01-19 MED ORDER — LANREOTIDE ACETATE 120 MG/0.5ML ~~LOC~~ SOLN
120.0000 mg | Freq: Once | SUBCUTANEOUS | Status: AC
  Administered 2022-01-19: 120 mg via SUBCUTANEOUS

## 2022-01-19 NOTE — Patient Instructions (Signed)
Lanreotide Injection What is this medication? LANREOTIDE (lan REE oh tide) treats high levels of growth hormone (acromegaly). It is used when other therapies have not worked well enough or cannot be tolerated. It works by reducing the amount of growth hormone your body makes. This reduces symptoms and the risk of health problems caused by too much growth hormone, such as diabetes and heart disease. It may also be used to treat neuroendocrine tumors, a cancer of the cells that release hormones and other substances in your body. It works by slowing down the release of these substances from the cells. This slows tumor growth. It also decreases the symptoms of carcinoid syndrome, such as flushing or diarrhea. This medicine may be used for other purposes; ask your health care provider or pharmacist if you have questions. COMMON BRAND NAME(S): Somatuline [place] What should I tell my care team before I take this medication? They need to know if you have any of these conditions: Diabetes Gallbladder disease Heart disease Kidney disease Liver disease Thyroid disease An unusual or allergic reaction to lanreotide, other medications, foods, dyes, or preservatives Pregnant or trying to get pregnant Breast-feeding How should I use this medication? This medication is injected under the skin. It is given by your care team in a hospital or clinic setting. Talk to your care team about the use of this medication in children. Special care may be needed. Overdosage: If you think you have taken too much of this medicine contact a poison control center or emergency room at once. NOTE: This medicine is only for you. Do not share this medicine with others. What if I miss a dose? Keep appointments for follow-up doses. It is important not to miss your dose. Call your care team if you are unable to keep an appointment. What may interact with this medication? Bromocriptine Cyclosporine Certain medications for blood  pressure, heart disease, irregular heartbeat Certain medications for diabetes Quinidine Terfenadine This list may not describe all possible interactions. Give your health care provider a list of all the medicines, herbs, non-prescription drugs, or dietary supplements you use. Also tell them if you smoke, drink alcohol, or use illegal drugs. Some items may interact with your medicine. What should I watch for while using this medication? Visit your care team for regular checks on your progress. Tell your care team if your symptoms do not start to get better or if they get worse. Your condition will be monitored carefully while you are receiving this medication. You may need blood work while you are taking this medication. This medication may increase blood sugar. The risk may be higher in patients who already have diabetes. Ask your care team what you can do to lower your risk of diabetes while taking this medication. Talk to your care team if you wish to become pregnant or think you may be pregnant. This medication can cause serious birth defects. Do not breast-feed while taking this medication and for 6 months after stopping therapy. This medication may cause infertility. Talk to your care team if you are concerned about your fertility. What side effects may I notice from receiving this medication? Side effects that you should report to your care team as soon as possible: Allergic reactions--skin rash, itching, hives, swelling of the face, lips, tongue, or throat Gallbladder problems--severe stomach pain, nausea, vomiting, fever High blood sugar (hyperglycemia)--increased thirst or amount of urine, unusual weakness or fatigue, blurry vision Increase in blood pressure Low blood sugar (hypoglycemia)--tremors or shaking, anxiety, sweating, cold   or clammy skin, confusion, dizziness, rapid heartbeat Low thyroid levels (hypothyroidism)--unusual weakness or fatigue, increased sensitivity to cold,  constipation, hair loss, dry skin, weight gain, feelings of depression Slow heartbeat--dizziness, feeling faint or lightheaded, confusion, trouble breathing, unusual weakness or fatigue Side effects that usually do not require medical attention (report to your care team if they continue or are bothersome): Diarrhea Dizziness Headache Muscle spasms Nausea Pain, redness, irritation, or bruising at the injection site Stomach pain This list may not describe all possible side effects. Call your doctor for medical advice about side effects. You may report side effects to FDA at 1-800-FDA-1088. Where should I keep my medication? This medication is given in a hospital or clinic. It will not be stored at home. NOTE: This sheet is a summary. It may not cover all possible information. If you have questions about this medicine, talk to your doctor, pharmacist, or health care provider.  2023 Elsevier/Gold Standard (2021-08-09 00:00:00)  

## 2022-01-23 ENCOUNTER — Ambulatory Visit: Payer: Federal, State, Local not specified - PPO | Admitting: Oncology

## 2022-01-23 ENCOUNTER — Ambulatory Visit: Payer: Federal, State, Local not specified - PPO

## 2022-02-16 ENCOUNTER — Ambulatory Visit: Payer: Federal, State, Local not specified - PPO | Admitting: Nurse Practitioner

## 2022-02-16 ENCOUNTER — Ambulatory Visit: Payer: Federal, State, Local not specified - PPO

## 2022-02-22 ENCOUNTER — Inpatient Hospital Stay: Payer: Federal, State, Local not specified - PPO | Attending: Nurse Practitioner | Admitting: Nurse Practitioner

## 2022-02-22 ENCOUNTER — Encounter: Payer: Self-pay | Admitting: Nurse Practitioner

## 2022-02-22 ENCOUNTER — Inpatient Hospital Stay: Payer: Federal, State, Local not specified - PPO

## 2022-02-22 VITALS — BP 127/89 | HR 73 | Temp 98.2°F | Resp 20 | Ht 71.0 in | Wt 190.2 lb

## 2022-02-22 DIAGNOSIS — C7B02 Secondary carcinoid tumors of liver: Secondary | ICD-10-CM | POA: Diagnosis not present

## 2022-02-22 DIAGNOSIS — C7A098 Malignant carcinoid tumors of other sites: Secondary | ICD-10-CM | POA: Insufficient documentation

## 2022-02-22 DIAGNOSIS — C7B04 Secondary carcinoid tumors of peritoneum: Secondary | ICD-10-CM | POA: Diagnosis not present

## 2022-02-22 DIAGNOSIS — D3A098 Benign carcinoid tumors of other sites: Secondary | ICD-10-CM

## 2022-02-22 MED ORDER — LANREOTIDE ACETATE 120 MG/0.5ML ~~LOC~~ SOLN
120.0000 mg | Freq: Once | SUBCUTANEOUS | Status: AC
  Administered 2022-02-22: 120 mg via SUBCUTANEOUS

## 2022-02-22 NOTE — Patient Instructions (Signed)
Lanreotide Injection What is this medication? LANREOTIDE (lan REE oh tide) treats high levels of growth hormone (acromegaly). It is used when other therapies have not worked well enough or cannot be tolerated. It works by reducing the amount of growth hormone your body makes. This reduces symptoms and the risk of health problems caused by too much growth hormone, such as diabetes and heart disease. It may also be used to treat neuroendocrine tumors, a cancer of the cells that release hormones and other substances in your body. It works by slowing down the release of these substances from the cells. This slows tumor growth. It also decreases the symptoms of carcinoid syndrome, such as flushing or diarrhea. This medicine may be used for other purposes; ask your health care provider or pharmacist if you have questions. COMMON BRAND NAME(S): Somatuline [place] What should I tell my care team before I take this medication? They need to know if you have any of these conditions: Diabetes Gallbladder disease Heart disease Kidney disease Liver disease Thyroid disease An unusual or allergic reaction to lanreotide, other medications, foods, dyes, or preservatives Pregnant or trying to get pregnant Breast-feeding How should I use this medication? This medication is injected under the skin. It is given by your care team in a hospital or clinic setting. Talk to your care team about the use of this medication in children. Special care may be needed. Overdosage: If you think you have taken too much of this medicine contact a poison control center or emergency room at once. NOTE: This medicine is only for you. Do not share this medicine with others. What if I miss a dose? Keep appointments for follow-up doses. It is important not to miss your dose. Call your care team if you are unable to keep an appointment. What may interact with this medication? Bromocriptine Cyclosporine Certain medications for blood  pressure, heart disease, irregular heartbeat Certain medications for diabetes Quinidine Terfenadine This list may not describe all possible interactions. Give your health care provider a list of all the medicines, herbs, non-prescription drugs, or dietary supplements you use. Also tell them if you smoke, drink alcohol, or use illegal drugs. Some items may interact with your medicine. What should I watch for while using this medication? Visit your care team for regular checks on your progress. Tell your care team if your symptoms do not start to get better or if they get worse. Your condition will be monitored carefully while you are receiving this medication. You may need blood work while you are taking this medication. This medication may increase blood sugar. The risk may be higher in patients who already have diabetes. Ask your care team what you can do to lower your risk of diabetes while taking this medication. Talk to your care team if you wish to become pregnant or think you may be pregnant. This medication can cause serious birth defects. Do not breast-feed while taking this medication and for 6 months after stopping therapy. This medication may cause infertility. Talk to your care team if you are concerned about your fertility. What side effects may I notice from receiving this medication? Side effects that you should report to your care team as soon as possible: Allergic reactions--skin rash, itching, hives, swelling of the face, lips, tongue, or throat Gallbladder problems--severe stomach pain, nausea, vomiting, fever High blood sugar (hyperglycemia)--increased thirst or amount of urine, unusual weakness or fatigue, blurry vision Increase in blood pressure Low blood sugar (hypoglycemia)--tremors or shaking, anxiety, sweating, cold   or clammy skin, confusion, dizziness, rapid heartbeat Low thyroid levels (hypothyroidism)--unusual weakness or fatigue, increased sensitivity to cold,  constipation, hair loss, dry skin, weight gain, feelings of depression Slow heartbeat--dizziness, feeling faint or lightheaded, confusion, trouble breathing, unusual weakness or fatigue Side effects that usually do not require medical attention (report to your care team if they continue or are bothersome): Diarrhea Dizziness Headache Muscle spasms Nausea Pain, redness, irritation, or bruising at the injection site Stomach pain This list may not describe all possible side effects. Call your doctor for medical advice about side effects. You may report side effects to FDA at 1-800-FDA-1088. Where should I keep my medication? This medication is given in a hospital or clinic. It will not be stored at home. NOTE: This sheet is a summary. It may not cover all possible information. If you have questions about this medicine, talk to your doctor, pharmacist, or health care provider.  2023 Elsevier/Gold Standard (2021-08-09 00:00:00)  

## 2022-02-22 NOTE — Progress Notes (Signed)
[place] OFFICE PROGRESS NOTE   Diagnosis: Carcinoid tumor  INTERVAL HISTORY:   [name] returns for follow-up.  On 01/24/2022 he underwent small bowel resection with anastomosis, cholecystectomy and hepatic metastasectomy.  Postoperative course was complicated by AKI, fever; fluid collection/abscess right abdomen status post drainage by IR; ERCP with "drain placement" 02/02/2022 (unable to locate this report); taken back to the OR 02/02/2022 exploratory laparotomy with small bowel resection, abdominal abscess drain; return to OR 02/04/2022 for exploratory laparotomy with small bowel resection, appendectomy and small bowel-small bowel anastomosis, left open; return to the OR 02/06/2022 for washout and closure.  He was discharged 02/14/2022.  He reports feeling better every day.  He describes bowel function as "normal".  No nausea or vomiting.  He has occasional diarrhea.  He reports losing "30 pounds in 30 days".  He has regained some of the weight.  Objective:  Vital signs in last 24 hours:  Blood pressure 127/89, pulse 73, temperature 98.2 F (36.8 C), temperature source Oral, resp. rate 20, height $RemoveBe'5\' 11"'[id number]$  (1.803 m), weight 190 lb 3.2 oz (86.3 kg), SpO2 100 %.    HEENT: No thrush or ulcers. Resp: Lungs clear bilaterally. Cardio: Regular rate and rhythm. GI: Abdomen soft and nontender.  No hepatomegaly. Vascular: No leg edema. Neuro: Alert and oriented.    Lab Results:  Lab Results  Component Value Date   WBC 4.8 12/20/2021   HGB 13.6 12/20/2021   HCT 39.2 12/20/2021   MCV 91.6 12/20/2021   PLT 209 12/20/2021   NEUTROABS 2.3 12/20/2021    Imaging:  No results found.  Medications: I have reviewed the patient's current medications.  Assessment/Plan: Metastatic neuroendocrine tumor, well-differentiated 10/31/2018 Abdominal ultrasound-numerous nearly confluent heterogeneous masses measuring up to 3.5 cm.   11/04/2018 CT abdomen/pelvis-multiple enhancing hepatic  lesions and a central partially calcified spiculated mesenteric mass measuring 3.1 x 3.1 cm.   11/13/2018 biopsy liver lesion-metastatic well-differentiated neuroendocrine tumor positive for CKAE1-AE3, synaptophysin, chromogranin, CD56 and CDX-2; Ki-67 labeling index less than 3%; TTF-1 negative Netspot 12/12/2018- increased activity associated a lobular mesenteric mass, small bowel medial to the mesenteric mass, and multiple liver lesions Elevated chromogranin A level and 24-hour urine 5 HIAA Monthly lanreotide beginning 12/23/2018 CT 05/13/2019-multiple large hepatic metastases on the dotatate are poorly visualized, several small lesions are decreased in size 1 new lesion, stable mesenteric mass Netspot 09/04/2019-increase in size of left and right liver lesions, stable to slightly decreased radiotracer accumulation, stable central mesenteric mass, no new metastatic lesions Lutathera 10/13/2019 Lutathera 12/04/2019 Lutathera 01/29/2020 Lutathera 03/25/2020 Restaging Netspot 04/22/2020-overall positive measurable response to therapy.  Multiple hepatic metastasis continue to demonstrate intense radiotracer activity, the activity is consistently decreased from preradiotherapy scan.  No evidence of progressive disease in the liver.  Central mesenteric metastasis and adjacent small bowel lesion have very similar metabolic activity and no change in size.  No evidence of new peritoneal metastasis or skeletal metastasis. Monthly lanreotide continued Netspot 03/02/2021-stable radiotracer avid confluent left and right liver lesions, stable central mesenteric nodal metastasis, no new sites of metastatic disease Monthly lanreotide continued MRI abdomen 05/22/2021-innumerable confluent contrast-enhancing liver lesions-not significantly changed compared to the PET/CT, stable mesenteric metastasis 07/19/2021-bland embolization of right hepatic metastases with micro particles in [place] 08/16/2021-MRI abdomen without  contrast-innumerable hepatic masses, decreased in size and overall bulk, especially in the right hepatic segments 5-8.  Stable central mesenteric mass 11/20/2021-dotatate PET-confluent radiotracer uptake at the right hepatic dome, elongated area of radiotracer uptake at the anterior superior  left hepatic lobe, small focus of uptake at the caudate lobe, radiotracer uptake associated with a partially calcified mesenteric mass and adjacent small bowel loop, no new sites of metastatic disease 11/21/2021-bland bleed/particle embolization of the left hepatic artery 01/24/2022-small bowel resection with anastomosis, cholecystectomy and hepatic metastasectomy (gallbladder with chronic cholecystitis with cholelithiasis; mesenteric lymph nodes with 2 of 22 lymph nodes positive for metastatic well differentiated neuroendocrine tumor; small bowel resection with well-differentiated neuroendocrine tumor, 2 cm, WHO grade 2, Ki-67 index approximately 3%, tumor extends from the mucosa to the serosa, mesenteric tumor deposit with necrosis and calcifications, 6 cm in greatest diameter (Ki-67 index approximately 8%), lymphovascular invasion present, perineural invasion present, margins negative for tumor; additional segment small bowel resection-benign small bowel with normal villous architecture and no significant histopathologic change, negative for tumor; liver segment 2 resection with metastatic well differentiated neuroendocrine tumor with extensive necrosis, 2.5 cm in greatest diameter; liver additional segment 2 resection metastatic well-differentiated neuroendocrine tumor with extensive necrosis, 5.1 cm in greatest diameter ERCP 02/02/2022, stent placed (report could not be located in care everywhere) 02/02/2022 exploratory laparotomy with small bowel resection-gangrenous necrosis with acute chronic inflammation; acute serositis with adhesions 02/04/2022 exploratory laparotomy with small bowel resection, appendectomy and small  bowel-small bowel anastomosis-benign appendix with periappendiceal subacute and chronic hemorrhagic serosal reaction, no evidence of carcinoid/neuroendocrine tumor History of MI/stent placement CAD Hypertension Renal insufficiency Hypothyroidism diagnosed with markedly elevated TSH and low T4 on 12/18/2018 Acute left elbow/right ankle pain and swelling August 2021 February 2023-acute renal failure following liver embolization procedure, improved    Disposition: [name] appears stable.  He underwent small bowel resection, cholecystectomy and hepatic metastasectomy last month.  He continues to recover from the surgery/hospitalization.  Plan to continue monthly lanreotide, injection today.  He reports having a stent placed during the hospitalization.  We were unable to locate the ERCP report.  We will contact Dr. Peyton Najjar office for this.  He is coordinating removal of the stent with Dr. Watt Climes.  We will see him in follow-up in 1 month.  He will contact the office in the interim with any problems.  Patient seen with Dr. Benay Spice.   Kamdin William Hancock ANP/GNP-BC   02/22/2022  8:57 AM This was a shared visit with Ned Card.  [name] is recovering from liver cytoreductive surgery and resection of the small bowel primary carcinoid tumor.  He had a complicated and prolonged postoperative course.  He appears well today.  He will continue monthly lanreotide.  He will continue postoperative care per Dr Hobbs-Maluccio.    We will contact Dr. Aggie Moats- Maluccio to discuss the case.  Julieanne Manson, MD

## 2022-02-23 ENCOUNTER — Other Ambulatory Visit: Payer: Self-pay | Admitting: Nurse Practitioner

## 2022-02-23 DIAGNOSIS — C7B02 Secondary carcinoid tumors of liver: Secondary | ICD-10-CM

## 2022-02-27 ENCOUNTER — Encounter: Payer: Self-pay | Admitting: Oncology

## 2022-02-27 ENCOUNTER — Ambulatory Visit
Admission: RE | Admit: 2022-02-27 | Discharge: 2022-02-27 | Disposition: A | Discharge status: Home / Self Care | Payer: Self-pay | Source: Ambulatory Visit | Attending: Nurse Practitioner | Admitting: Nurse Practitioner

## 2022-02-27 ENCOUNTER — Inpatient Hospital Stay: Payer: Federal, State, Local not specified - PPO

## 2022-02-27 ENCOUNTER — Other Ambulatory Visit: Payer: Self-pay | Admitting: *Deleted

## 2022-02-27 DIAGNOSIS — C7B04 Secondary carcinoid tumors of peritoneum: Secondary | ICD-10-CM | POA: Diagnosis not present

## 2022-02-27 DIAGNOSIS — C7B02 Secondary carcinoid tumors of liver: Secondary | ICD-10-CM

## 2022-02-27 DIAGNOSIS — C7A098 Malignant carcinoid tumors of other sites: Secondary | ICD-10-CM | POA: Diagnosis not present

## 2022-02-27 LAB — CMP (CANCER CENTER ONLY)
ALT: 17 U/L (ref 0–44)
AST: 24 U/L (ref 15–41)
Albumin: 4.8 g/dL (ref 3.5–5.0)
Alkaline Phosphatase: 341 U/L — ABNORMAL HIGH (ref 38–126)
Anion gap: 12 (ref 5–15)
BUN: 14 mg/dL (ref 6–20)
CO2: 25 mmol/L (ref 22–32)
Calcium: 10.1 mg/dL (ref 8.9–10.3)
Chloride: 103 mmol/L (ref 98–111)
Creatinine: 1.38 mg/dL — ABNORMAL HIGH (ref 0.61–1.24)
GFR, Estimated: 59 mL/min — ABNORMAL LOW (ref >60)
Glucose, Bld: 95 mg/dL (ref 70–99)
Potassium: 3.8 mmol/L (ref 3.5–5.1)
Sodium: 140 mmol/L (ref 135–145)
Total Bilirubin: 0.6 mg/dL (ref 0.3–1.2)
Total Protein: 8.4 g/dL — ABNORMAL HIGH (ref 6.5–8.1)

## 2022-02-27 LAB — CBC WITH DIFFERENTIAL (CANCER CENTER ONLY)
Abs Immature Granulocytes: 0.04 10*3/uL (ref 0.00–0.07)
Basophils Absolute: 0.1 10*3/uL (ref 0.0–0.1)
Basophils Relative: 1 %
Eosinophils Absolute: 0.4 10*3/uL (ref 0.0–0.5)
Eosinophils Relative: 6 %
HCT: 33.7 % — ABNORMAL LOW (ref 39.0–52.0)
Hemoglobin: 11.2 g/dL — ABNORMAL LOW (ref 13.0–17.0)
Immature Granulocytes: 1 %
Lymphocytes Relative: 25 %
Lymphs Abs: 1.7 10*3/uL (ref 0.7–4.0)
MCH: 31.4 pg (ref 26.0–34.0)
MCHC: 33.2 g/dL (ref 30.0–36.0)
MCV: 94.4 fL (ref 80.0–100.0)
Monocytes Absolute: 0.5 10*3/uL (ref 0.1–1.0)
Monocytes Relative: 8 %
Neutro Abs: 4.1 10*3/uL (ref 1.7–7.7)
Neutrophils Relative %: 59 %
Platelet Count: 301 10*3/uL (ref 150–400)
RBC: 3.57 MIL/uL — ABNORMAL LOW (ref 4.22–5.81)
RDW: 13.9 % (ref 11.5–15.5)
WBC Count: 6.9 10*3/uL (ref 4.0–10.5)
nRBC: 0 % (ref 0.0–0.2)

## 2022-02-27 LAB — IRON AND TIBC
Iron: 85 ug/dL (ref 45–182)
Saturation Ratios: 16 % — ABNORMAL LOW (ref 17.9–39.5)
TIBC: 521 ug/dL — ABNORMAL HIGH (ref 250–450)
UIBC: 436 ug/dL

## 2022-02-27 LAB — C-REACTIVE PROTEIN: CRP: <0.5 mg/dL (ref <1.0)

## 2022-02-27 LAB — PREALBUMIN: Prealbumin: 31 mg/dL (ref 18–38)

## 2022-02-27 NOTE — Progress Notes (Signed)
Patient presented to office requesting labs be drawn today for physician in [place]. Orders entered as requested. Suggested he send order sheet via MyChart.

## 2022-02-27 NOTE — Progress Notes (Signed)
Outside films order placed for CD upload

## 2022-02-28 ENCOUNTER — Encounter: Payer: Self-pay | Admitting: *Deleted

## 2022-02-28 NOTE — Progress Notes (Signed)
Faxed labs from 02/27/22 to Dr. Edman Circle Maluccio [phone].

## 2022-03-19 ENCOUNTER — Other Ambulatory Visit: Payer: Self-pay | Admitting: Gastroenterology

## 2022-03-19 ENCOUNTER — Encounter: Payer: Self-pay | Admitting: Oncology

## 2022-03-20 ENCOUNTER — Encounter (HOSPITAL_COMMUNITY): Payer: Self-pay | Admitting: Gastroenterology

## 2022-03-21 ENCOUNTER — Inpatient Hospital Stay: Payer: Federal, State, Local not specified - PPO

## 2022-03-21 ENCOUNTER — Inpatient Hospital Stay: Payer: Federal, State, Local not specified - PPO | Attending: Nurse Practitioner | Admitting: Oncology

## 2022-03-21 VITALS — BP 141/86 | HR 74 | Temp 98.2°F | Resp 18 | Ht 71.0 in | Wt 192.2 lb

## 2022-03-21 DIAGNOSIS — C7B02 Secondary carcinoid tumors of liver: Secondary | ICD-10-CM

## 2022-03-21 DIAGNOSIS — Z23 Encounter for immunization: Secondary | ICD-10-CM | POA: Diagnosis not present

## 2022-03-21 DIAGNOSIS — C7B8 Other secondary neuroendocrine tumors: Secondary | ICD-10-CM | POA: Diagnosis not present

## 2022-03-21 DIAGNOSIS — C7A098 Malignant carcinoid tumors of other sites: Secondary | ICD-10-CM | POA: Insufficient documentation

## 2022-03-21 DIAGNOSIS — C7B04 Secondary carcinoid tumors of peritoneum: Secondary | ICD-10-CM | POA: Diagnosis not present

## 2022-03-21 DIAGNOSIS — R978 Other abnormal tumor markers: Secondary | ICD-10-CM | POA: Insufficient documentation

## 2022-03-21 DIAGNOSIS — C7A8 Other malignant neuroendocrine tumors: Secondary | ICD-10-CM | POA: Insufficient documentation

## 2022-03-21 DIAGNOSIS — D3A098 Benign carcinoid tumors of other sites: Secondary | ICD-10-CM

## 2022-03-21 LAB — CBC WITH DIFFERENTIAL (CANCER CENTER ONLY)
Abs Immature Granulocytes: 0.02 10*3/uL (ref 0.00–0.07)
Basophils Absolute: 0 10*3/uL (ref 0.0–0.1)
Basophils Relative: 1 %
Eosinophils Absolute: 0.3 10*3/uL (ref 0.0–0.5)
Eosinophils Relative: 6 %
HCT: 34.7 % — ABNORMAL LOW (ref 39.0–52.0)
Hemoglobin: 11.9 g/dL — ABNORMAL LOW (ref 13.0–17.0)
Immature Granulocytes: 0 %
Lymphocytes Relative: 18 %
Lymphs Abs: 1.1 10*3/uL (ref 0.7–4.0)
MCH: 31.8 pg (ref 26.0–34.0)
MCHC: 34.3 g/dL (ref 30.0–36.0)
MCV: 92.8 fL (ref 80.0–100.0)
Monocytes Absolute: 0.4 10*3/uL (ref 0.1–1.0)
Monocytes Relative: 7 %
Neutro Abs: 3.9 10*3/uL (ref 1.7–7.7)
Neutrophils Relative %: 68 %
Platelet Count: 174 10*3/uL (ref 150–400)
RBC: 3.74 MIL/uL — ABNORMAL LOW (ref 4.22–5.81)
RDW: 12.7 % (ref 11.5–15.5)
WBC Count: 5.7 10*3/uL (ref 4.0–10.5)
nRBC: 0 % (ref 0.0–0.2)

## 2022-03-21 LAB — CMP (CANCER CENTER ONLY)
ALT: 26 U/L (ref 0–44)
AST: 24 U/L (ref 15–41)
Albumin: 4.6 g/dL (ref 3.5–5.0)
Alkaline Phosphatase: 190 U/L — ABNORMAL HIGH (ref 38–126)
Anion gap: 10 (ref 5–15)
BUN: 13 mg/dL (ref 6–20)
CO2: 25 mmol/L (ref 22–32)
Calcium: 9.6 mg/dL (ref 8.9–10.3)
Chloride: 107 mmol/L (ref 98–111)
Creatinine: 1.25 mg/dL — ABNORMAL HIGH (ref 0.61–1.24)
GFR, Estimated: >60 mL/min (ref >60)
Glucose, Bld: 141 mg/dL — ABNORMAL HIGH (ref 70–99)
Potassium: 3.7 mmol/L (ref 3.5–5.1)
Sodium: 142 mmol/L (ref 135–145)
Total Bilirubin: 0.8 mg/dL (ref 0.3–1.2)
Total Protein: 7.6 g/dL (ref 6.5–8.1)

## 2022-03-21 MED ORDER — LANREOTIDE ACETATE 120 MG/0.5ML ~~LOC~~ SOLN
120.0000 mg | Freq: Once | SUBCUTANEOUS | Status: AC
  Administered 2022-03-21: 120 mg via SUBCUTANEOUS
  Filled 2022-03-21: qty 120

## 2022-03-21 MED ORDER — INFLUENZA VAC SPLIT QUAD 0.5 ML IM SUSY
0.5000 mL | PREFILLED_SYRINGE | Freq: Once | INTRAMUSCULAR | Status: AC
  Administered 2022-03-21: 0.5 mL via INTRAMUSCULAR
  Filled 2022-03-21: qty 0.5

## 2022-03-21 NOTE — Progress Notes (Signed)
[facility] OFF[facility] PROGRESS NOTE   Diagnosis: Carcinoid tumor  INTERVAL HISTORY:   Mr. Mentzer returns as scheduled.  He reports feeling well.  His energy level continues to improve.  No flushing.  He has intermittent diarrhea, but this is not consistent.  He takes Imodium as needed.  Objective:  Vital signs in last 24 hours:  Blood pressure (!) 141/86, pulse 74, temperature 98.2 F (36.8 C), temperature source Oral, resp. rate 18, height $RemoveBe'5\' 11"'dhAeaHpDB$  (1.803 m), weight 192 lb 3.2 oz (87.2 kg), SpO2 100 %.    Resp: Lungs clear bilaterally Cardio: Regular rate and rhythm GI: No hepatosplenomegaly, nontender, healed surgical incision Vascular: No leg edema   Lab Results:  Lab Results  Component Value Date   WBC 5.7 03/21/2022   HGB 11.9 (L) 03/21/2022   HCT 34.7 (L) 03/21/2022   MCV 92.8 03/21/2022   PLT 174 03/21/2022   NEUTROABS 3.9 03/21/2022    CMP  Lab Results  Component Value Date   NA 140 02/27/2022   K 3.8 02/27/2022   CL 103 02/27/2022   CO2 25 02/27/2022   GLUCOSE 95 02/27/2022   BUN 14 02/27/2022   CREATININE 1.38 (H) 02/27/2022   CALCIUM 10.1 02/27/2022   PROT 8.4 (H) 02/27/2022   ALBUMIN 4.8 02/27/2022   AST 24 02/27/2022   ALT 17 02/27/2022   ALKPHOS 341 (H) 02/27/2022   BILITOT 0.6 02/27/2022   GFRNONAA 59 (L) 02/27/2022   GFRAA >60 02/26/2020    Medications: I have reviewed the patient's current medications.   Assessment/Plan:  Metastatic neuroendocrine tumor, well-differentiated 10/31/2018 Abdominal ultrasound-numerous nearly confluent heterogeneous masses measuring up to 3.5 cm.   11/04/2018 CT abdomen/pelvis-multiple enhancing hepatic lesions and a central partially calcified spiculated mesenteric mass measuring 3.1 x 3.1 cm.   11/13/2018 biopsy liver lesion-metastatic well-differentiated neuroendocrine tumor positive for CKAE1-AE3, synaptophysin, chromogranin, CD56 and CDX-2; Ki-67 labeling index less than 3%; TTF-1  negative Netspot 12/12/2018- increased activity associated a lobular mesenteric mass, small bowel medial to the mesenteric mass, and multiple liver lesions Elevated chromogranin A level and 24-hour urine 5 HIAA Monthly lanreotide beginning 12/23/2018 CT 05/13/2019-multiple large hepatic metastases on the dotatate are poorly visualized, several small lesions are decreased in size 1 new lesion, stable mesenteric mass Netspot 09/04/2019-increase in size of left and right liver lesions, stable to slightly decreased radiotracer accumulation, stable central mesenteric mass, no new metastatic lesions Lutathera 10/13/2019 Lutathera 12/04/2019 Lutathera 01/29/2020 Lutathera 03/25/2020 Restaging Netspot 04/22/2020-overall positive measurable response to therapy.  Multiple hepatic metastasis continue to demonstrate intense radiotracer activity, the activity is consistently decreased from preradiotherapy scan.  No evidence of progressive disease in the liver.  Central mesenteric metastasis and adjacent small bowel lesion have very similar metabolic activity and no change in size.  No evidence of new peritoneal metastasis or skeletal metastasis. Monthly lanreotide continued Netspot 03/02/2021-stable radiotracer avid confluent left and right liver lesions, stable central mesenteric nodal metastasis, no new sites of metastatic disease Monthly lanreotide continued MRI abdomen 05/22/2021-innumerable confluent contrast-enhancing liver lesions-not significantly changed compared to the PET/CT, stable mesenteric metastasis 07/19/2021-bland embolization of right hepatic metastases with micro particles in [place] 08/16/2021-MRI abdomen without contrast-innumerable hepatic masses, decreased in size and overall bulk, especially in the right hepatic segments 5-8.  Stable central mesenteric mass 11/20/2021-dotatate PET-confluent radiotracer uptake at the right hepatic dome, elongated area of radiotracer uptake at the anterior superior  left hepatic lobe, small focus of uptake at the caudate lobe, radiotracer uptake associated with a  partially calcified mesenteric mass and adjacent small bowel loop, no new sites of metastatic disease 11/21/2021-bland bleed/particle embolization of the left hepatic artery 01/24/2022-small bowel resection with anastomosis, cholecystectomy and hepatic metastasectomy (gallbladder with chronic cholecystitis with cholelithiasis; mesenteric lymph nodes with 2 of 22 lymph nodes positive for metastatic well differentiated neuroendocrine tumor; small bowel resection with well-differentiated neuroendocrine tumor, 2 cm, WHO grade 2, Ki-67 index approximately 3%, tumor extends from the mucosa to the serosa, mesenteric tumor deposit with necrosis and calcifications, 6 cm in greatest diameter (Ki-67 index approximately 8%), lymphovascular invasion present, perineural invasion present, margins negative for tumor; additional segment small bowel resection-benign small bowel with normal villous architecture and no significant histopathologic change, negative for tumor; liver segment 2 resection with metastatic well differentiated neuroendocrine tumor with extensive necrosis, 2.5 cm in greatest diameter; liver additional segment 2 resection metastatic well-differentiated neuroendocrine tumor with extensive necrosis, 5.1 cm in greatest diameter ERCP 02/02/2022, stent placed (report could not be located in care everywhere) 02/02/2022 exploratory laparotomy with small bowel resection-gangrenous necrosis with acute chronic inflammation; acute serositis with adhesions 02/04/2022 exploratory laparotomy with small bowel resection, appendectomy and small bowel-small bowel anastomosis-benign appendix with periappendiceal subacute and chronic hemorrhagic serosal reaction, no evidence of carcinoid/neuroendocrine tumor History of MI/stent placement CAD Hypertension Renal insufficiency Hypothyroidism diagnosed with markedly elevated TSH and low  T4 on 12/18/2018 Acute left elbow/right ankle pain and swelling August 2021 February 2023-acute renal failure following liver embolization procedure, improved     Disposition: Mr. Hawthorne continues to recover from the liver and small bowel resection performed in August.  He has a good appetite.  His energy level is improving.  I discussed the case with Dr. Deniece Ree.  She recommends continuing lanreotide.  We discussed treatment options for when he has disease progression including repeat embolization therapy, and radio peptide therapy.  We will follow-up on the chromogranin A level from today.  He will return for an office visit in 3 months.  He has an appoint with Dr. Deniece Ree later this week to discuss the plan for surveillance imaging.  He will contact us with her recommendation.  Betsy Coder, MD  03/21/2022  1:35 PM

## 2022-03-22 LAB — CHROMOGRANIN A: Chromogranin A (ng/mL): 250.2 ng/mL — ABNORMAL HIGH (ref 0.0–101.8)

## 2022-03-23 DIAGNOSIS — C7A095 Malignant carcinoid tumor of the midgut NOS: Secondary | ICD-10-CM | POA: Diagnosis not present

## 2022-03-27 ENCOUNTER — Encounter (HOSPITAL_COMMUNITY): Payer: Self-pay | Admitting: Gastroenterology

## 2022-03-27 ENCOUNTER — Ambulatory Visit (HOSPITAL_COMMUNITY)
Admission: RE | Admit: 2022-03-27 | Discharge: 2022-03-27 | Disposition: A | Discharge status: Home / Self Care | Payer: Federal, State, Local not specified - PPO | Attending: Gastroenterology | Admitting: Gastroenterology

## 2022-03-27 ENCOUNTER — Other Ambulatory Visit: Payer: Self-pay

## 2022-03-27 ENCOUNTER — Ambulatory Visit (HOSPITAL_COMMUNITY): Payer: Federal, State, Local not specified - PPO | Admitting: Certified Registered Nurse Anesthetist

## 2022-03-27 ENCOUNTER — Encounter (HOSPITAL_COMMUNITY)
Admission: RE | Disposition: A | Discharge status: Home / Self Care | Payer: Self-pay | Source: Home / Self Care | Attending: Gastroenterology

## 2022-03-27 DIAGNOSIS — I252 Old myocardial infarction: Secondary | ICD-10-CM | POA: Insufficient documentation

## 2022-03-27 DIAGNOSIS — Z8589 Personal history of malignant neoplasm of other organs and systems: Secondary | ICD-10-CM | POA: Insufficient documentation

## 2022-03-27 DIAGNOSIS — Z79899 Other long term (current) drug therapy: Secondary | ICD-10-CM | POA: Diagnosis not present

## 2022-03-27 DIAGNOSIS — K2980 Duodenitis without bleeding: Secondary | ICD-10-CM | POA: Insufficient documentation

## 2022-03-27 DIAGNOSIS — X58XXXA Exposure to other specified factors, initial encounter: Secondary | ICD-10-CM | POA: Insufficient documentation

## 2022-03-27 DIAGNOSIS — Z8505 Personal history of malignant neoplasm of liver: Secondary | ICD-10-CM | POA: Diagnosis not present

## 2022-03-27 DIAGNOSIS — I10 Essential (primary) hypertension: Secondary | ICD-10-CM | POA: Insufficient documentation

## 2022-03-27 DIAGNOSIS — Z87891 Personal history of nicotine dependence: Secondary | ICD-10-CM | POA: Insufficient documentation

## 2022-03-27 DIAGNOSIS — T183XXA Foreign body in small intestine, initial encounter: Secondary | ICD-10-CM | POA: Insufficient documentation

## 2022-03-27 DIAGNOSIS — I2510 Atherosclerotic heart disease of native coronary artery without angina pectoris: Secondary | ICD-10-CM | POA: Diagnosis not present

## 2022-03-27 DIAGNOSIS — Z4659 Encounter for fitting and adjustment of other gastrointestinal appliance and device: Secondary | ICD-10-CM | POA: Diagnosis not present

## 2022-03-27 HISTORY — PX: ESOPHAGOGASTRODUODENOSCOPY (EGD) WITH PROPOFOL: SHX5813

## 2022-03-27 HISTORY — PX: GASTROINTESTINAL STENT REMOVAL: SHX6384

## 2022-03-27 SURGERY — ESOPHAGOGASTRODUODENOSCOPY (EGD) WITH PROPOFOL
Anesthesia: Monitor Anesthesia Care

## 2022-03-27 MED ORDER — SODIUM CHLORIDE 0.9 % IV SOLN: INTRAVENOUS | Status: DC

## 2022-03-27 MED ORDER — PROPOFOL 500 MG/50ML IV EMUL
INTRAVENOUS | Status: DC | PRN
  Administered 2022-03-27: 150 ug/kg/min via INTRAVENOUS

## 2022-03-27 MED ORDER — PROPOFOL 10 MG/ML IV BOLUS
INTRAVENOUS | Status: DC | PRN
  Administered 2022-03-27: 20 mg via INTRAVENOUS

## 2022-03-27 MED ORDER — ONDANSETRON HCL 4 MG/2ML IJ SOLN
INTRAMUSCULAR | Status: DC | PRN
  Administered 2022-03-27: 4 mg via INTRAVENOUS

## 2022-03-27 MED ORDER — LACTATED RINGERS IV SOLN: INTRAVENOUS | Status: DC

## 2022-03-27 MED ORDER — LIDOCAINE 2% (20 MG/ML) 5 ML SYRINGE
INTRAMUSCULAR | Status: DC | PRN
  Administered 2022-03-27: 100 mg via INTRAVENOUS

## 2022-03-27 SURGICAL SUPPLY — 15 items

## 2022-03-27 NOTE — Discharge Instructions (Addendum)
Call if question or problem otherwise follow-up as needed and begin with soft solid diet and call [facility] to schedule colonoscopy at the appropriate time  YOU HAD AN ENDOSCOPIC PROCEDURE TODAY: Refer to the procedure report and other information in the discharge instructions given to you for any specific questions about what was found during the examination. If this information does not answer your questions, please call [facility] office at [phone] to clarify.   YOU SHOULD EXPECT: Some feelings of bloating in the abdomen. Passage of more gas than usual. Walking can help get rid of the air that was put into your GI tract during the procedure and reduce the bloating. If you had a lower endoscopy (such as a colonoscopy or flexible sigmoidoscopy) you may notice spotting of blood in your stool or on the toilet paper. Some abdominal soreness may be present for a day or two, also.  DIET: Your first meal following the procedure should be a light meal and then it is ok to progress to your normal diet. A half-sandwich or bowl of soup is an example of a good first meal. Heavy or fried foods are harder to digest and may make you feel nauseous or bloated. Drink plenty of fluids but you should avoid alcoholic beverages for 24 hours. If you had a esophageal dilation, please see attached instructions for diet.    ACTIVITY: Your care partner should take you home directly after the procedure. You should plan to take it easy, moving slowly for the rest of the day. You can resume normal activity the day after the procedure however YOU SHOULD NOT DRIVE, use power tools, machinery or perform tasks that involve climbing or major physical exertion for 24 hours (because of the sedation medicines used during the test).   SYMPTOMS TO REPORT IMMEDIATELY: A gastroenterologist can be reached at any hour. Please call [phone]  for any of the following symptoms:  Following lower endoscopy (colonoscopy, flexible  sigmoidoscopy) Excessive amounts of blood in the stool  Significant tenderness, worsening of abdominal pains  Swelling of the abdomen that is new, acute  Fever of 100 or higher  Following upper endoscopy (EGD, EUS, ERCP, esophageal dilation) Vomiting of blood or coffee ground material  New, significant abdominal pain  New, significant chest pain or pain under the shoulder blades  Painful or persistently difficult swallowing  New shortness of breath  Black, tarry-looking or red, bloody stools  FOLLOW UP:  If any biopsies were taken you will be contacted by phone or by letter within the next 1-3 weeks. Call [phone]  if you have not heard about the biopsies in 3 weeks.  Please also call with any specific questions about appointments or follow up tests. YOU HAD AN ENDOSCOPIC PROCEDURE TODAY: Refer to the procedure report and other information in the discharge instructions given to you for any specific questions about what was found during the examination. If this information does not answer your questions, please call [facility] office at [phone] to clarify.   YOU SHOULD EXPECT: Some feelings of bloating in the abdomen. Passage of more gas than usual. Walking can help get rid of the air that was put into your GI tract during the procedure and reduce the bloating. If you had a lower endoscopy (such as a colonoscopy or flexible sigmoidoscopy) you may notice spotting of blood in your stool or on the toilet paper. Some abdominal soreness may be present for a day or two, also.  DIET: Your first meal following  the procedure should be a light meal and then it is ok to progress to your normal diet. A half-sandwich or bowl of soup is an example of a good first meal. Heavy or fried foods are harder to digest and may make you feel nauseous or bloated. Drink plenty of fluids but you should avoid alcoholic beverages for 24 hours. If you had a esophageal dilation, please see attached instructions for diet.     ACTIVITY: Your care partner should take you home directly after the procedure. You should plan to take it easy, moving slowly for the rest of the day. You can resume normal activity the day after the procedure however YOU SHOULD NOT DRIVE, use power tools, machinery or perform tasks that involve climbing or major physical exertion for 24 hours (because of the sedation medicines used during the test).   SYMPTOMS TO REPORT IMMEDIATELY: A gastroenterologist can be reached at any hour. Please call [phone]  for any of the following symptoms:   Following upper endoscopy (EGD, EUS, ERCP, esophageal dilation) Vomiting of blood or coffee ground material  New, significant abdominal pain  New, significant chest pain or pain under the shoulder blades  Painful or persistently difficult swallowing  New shortness of breath  Black, tarry-looking or red, bloody stools  FOLLOW UP:  If any biopsies were taken you will be contacted by phone or by letter within the next 1-3 weeks. Call [phone]  if you have not heard about the biopsies in 3 weeks.  Please also call with any specific questions about appointments or follow up tests.

## 2022-03-27 NOTE — Progress Notes (Signed)
William Hancock 12:48 PM  Subjective: Patient doing well without any problems and we discussed his surgery and has no new complaints  Objective: Vital signs stable afebrile no acute distress exam please see preassessment evaluation  Assessment: Bile leak status post stenting in [place]: Okay to proceed with endoscopy with anesthesia assistance and stent removal  [facility]  office [phone] After 5PM or if no answer call [phone]

## 2022-03-27 NOTE — Anesthesia Preprocedure Evaluation (Signed)
Anesthesia Evaluation  Patient identified by MRN, date of birth, ID band Patient awake    Reviewed: Allergy & Precautions, NPO status , Patient's Chart, lab work & pertinent test results  Airway Mallampati: II  TM Distance: >3 FB Neck ROM: Full    Dental   Pulmonary former smoker,    breath sounds clear to auscultation       Cardiovascular hypertension, Pt. on medications + CAD and + Past MI   Rhythm:Regular Rate:Normal     Neuro/Psych negative neurological ROS     GI/Hepatic Neg liver ROS, Neuroendocrine tumor s/p multiple surgeries for resection and metastatic embolization.   Endo/Other  S/p neuroendocrine tumor resection  Renal/GU Renal InsufficiencyRenal disease     Musculoskeletal   Abdominal   Peds  Hematology  (+) Blood dyscrasia, anemia ,   Anesthesia Other Findings   Reproductive/Obstetrics                             Anesthesia Physical Anesthesia Plan  ASA: 2  Anesthesia Plan: MAC   Post-op Pain Management:    Induction:   PONV Risk Score and Plan: 1 and Propofol infusion  Airway Management Planned: Natural Airway and Nasal Cannula  Additional Equipment:   Intra-op Plan:   Post-operative Plan:   Informed Consent: I have reviewed the patients History and Physical, chart, labs and discussed the procedure including the risks, benefits and alternatives for the proposed anesthesia with the patient or authorized representative who has indicated his/her understanding and acceptance.       Plan Discussed with:   Anesthesia Plan Comments:         Anesthesia Quick Evaluation

## 2022-03-27 NOTE — Op Note (Signed)
[facility] Patient Name: William Hancock Procedure Date: 03/27/2022 MRN: [id number] Attending MD: Clarene Essex , MD Date of Birth: [birth year] CSN: [id number] Age: 56 Admit Type: Outpatient Procedure:                Upper GI endoscopy Indications:              Foreign body in the small bowel, Biliary stent                            removal Providers:                Clarene Essex, MD, Jeanella Cara, RN, Frazier Richards, Technician Referring MD:              Medicines:                Monitored Anesthesia Care Complications:            No immediate complications. Estimated Blood Loss:     Estimated blood loss: none. Procedure:                Pre-Anesthesia Assessment:                           - Prior to the procedure, a History and Physical                            was performed, and patient medications and                            allergies were reviewed. The patient's tolerance of                            previous anesthesia was also reviewed. The risks                            and benefits of the procedure and the sedation                            options and risks were discussed with the patient.                            All questions were answered, and informed consent                            was obtained. Prior Anticoagulants: The patient has                            taken no previous anticoagulant or antiplatelet                            agents except for aspirin. ASA Grade Assessment: II                            - A  patient with mild systemic disease. After                            reviewing the risks and benefits, the patient was                            deemed in satisfactory condition to undergo the                            procedure.                           After obtaining informed consent, the endoscope was                            passed under direct vision. Throughout the                             procedure, the patient's blood pressure, pulse, and                            oxygen saturations were monitored continuously. The                            [id number]-[id number] ([id number]) Olympus endoscope was introduced                            through the mouth, and advanced to the second part                            of duodenum. The upper GI endoscopy was                            accomplished without difficulty. The patient                            tolerated the procedure well. Scope In: Scope Out: Findings:      The examined esophagus was normal.      The entire examined stomach was normal.      Localized mild inflammation characterized by erythema was found in the       duodenal bulb.      A previously placed plastic biliary stent was seen in the ampulla. Stent       removal was accomplished with a Raptor grasping device.      The exam was otherwise without abnormality. Impression:               - Normal esophagus.                           - Normal stomach.                           - Duodenitis.                           - Plastic biliary stent in  the duodenum. Removed.                           - The examination was otherwise normal. Moderate Sedation:      Not Applicable - Patient had care per Anesthesia. Recommendation:           - Patient has a contact number available for                            emergencies. The signs and symptoms of potential                            delayed complications were discussed with the                            patient. Return to normal activities tomorrow.                            Written discharge instructions were provided to the                            patient.                           - Soft diet today.                           - Continue present medications.                           - Return to GI clinic PRN.                           - Telephone GI clinic if symptomatic PRN.                           - Perform a colonoscopy  at appointment to be                            scheduled. Procedure Code(s):        --- Professional ---                           [phone], Esophagogastroduodenoscopy, flexible,                            transoral; with removal of foreign body(s) Diagnosis Code(s):        --- Professional ---                           K29.80, Duodenitis without bleeding                           Z46.59, Encounter for fitting and adjustment of                            other gastrointestinal appliance and device  T18.3XXA, Foreign body in small intestine, initial                            encounter CPT copyright 2019 [employer]. All rights reserved. The codes documented in this report are preliminary and upon coder review may  be revised to meet current compliance requirements. Clarene Essex, MD 03/27/2022 1:15:15 PM This report has been signed electronically. Number of Addenda: 0

## 2022-03-27 NOTE — Transfer of Care (Signed)
Immediate Anesthesia Transfer of Care Note  Patient: William Hancock  Procedure(s) Performed: ESOPHAGOGASTRODUODENOSCOPY (EGD) WITH PROPOFOL GASTROINTESTINAL STENT REMOVAL  Patient Location: PACU and Endoscopy Unit  Anesthesia Type:MAC  Level of Consciousness: awake, alert  and oriented  Airway & Oxygen Therapy: Patient Spontanous Breathing and Patient connected to face mask oxygen  Post-op Assessment: Report given to RN and Post -op Vital signs reviewed and stable  Post vital signs: Reviewed and stable  Last Vitals:  Vitals Value Taken Time  BP 142/81 03/27/22 1312  Temp 37 C 03/27/22 1309  Pulse 65 03/27/22 1313  Resp 11 03/27/22 1313  SpO2 100 % 03/27/22 1313  Vitals shown include unvalidated device data.  Last Pain:  Vitals:   03/27/22 1309  TempSrc: Temporal  PainSc: 0-No pain         Complications: No notable events documented.

## 2022-03-27 NOTE — Anesthesia Procedure Notes (Signed)
Procedure Name: MAC Date/Time: 03/27/2022 12:49 PM  Performed by: Maxwell Caul, CRNAPre-anesthesia Checklist: Patient identified, Emergency Drugs available, Suction available and Patient being monitored Oxygen Delivery Method: Simple face mask

## 2022-03-28 NOTE — Anesthesia Postprocedure Evaluation (Signed)
Anesthesia Post Note  Patient: William Hancock  Procedure(s) Performed: ESOPHAGOGASTRODUODENOSCOPY (EGD) WITH PROPOFOL GASTROINTESTINAL STENT REMOVAL     Patient location during evaluation: PACU Anesthesia Type: MAC Level of consciousness: awake and alert Pain management: pain level controlled Vital Signs Assessment: post-procedure vital signs reviewed and stable Respiratory status: spontaneous breathing, nonlabored ventilation, respiratory function stable and patient connected to nasal cannula oxygen Cardiovascular status: stable and blood pressure returned to baseline Postop Assessment: no apparent nausea or vomiting Anesthetic complications: no   No notable events documented.  Last Vitals:  Vitals:   03/27/22 1319 03/27/22 1329  BP: (!) 148/81 (!) 155/94  Pulse: 62 (!) 59  Resp: 11 13  Temp:    SpO2: 100% 100%    Last Pain:  Vitals:   03/27/22 1329  TempSrc:   PainSc: 0-No pain                 William Hancock

## 2022-03-29 ENCOUNTER — Encounter (HOSPITAL_COMMUNITY): Payer: Self-pay | Admitting: Gastroenterology

## 2022-04-04 DIAGNOSIS — Z0000 Encounter for general adult medical examination without abnormal findings: Secondary | ICD-10-CM | POA: Diagnosis not present

## 2022-04-17 ENCOUNTER — Inpatient Hospital Stay: Payer: Federal, State, Local not specified - PPO | Attending: Nurse Practitioner

## 2022-04-17 VITALS — BP 139/92 | HR 64 | Temp 98.1°F | Resp 20 | Ht 71.0 in | Wt 198.0 lb

## 2022-04-17 DIAGNOSIS — D3A098 Benign carcinoid tumors of other sites: Secondary | ICD-10-CM

## 2022-04-17 DIAGNOSIS — C7B02 Secondary carcinoid tumors of liver: Secondary | ICD-10-CM | POA: Insufficient documentation

## 2022-04-17 DIAGNOSIS — C7A098 Malignant carcinoid tumors of other sites: Secondary | ICD-10-CM | POA: Diagnosis not present

## 2022-04-17 DIAGNOSIS — C7B04 Secondary carcinoid tumors of peritoneum: Secondary | ICD-10-CM | POA: Diagnosis not present

## 2022-04-17 MED ORDER — LANREOTIDE ACETATE 120 MG/0.5ML ~~LOC~~ SOLN
120.0000 mg | Freq: Once | SUBCUTANEOUS | Status: AC
  Administered 2022-04-17: 120 mg via SUBCUTANEOUS
  Filled 2022-04-17: qty 120

## 2022-04-17 NOTE — Patient Instructions (Signed)
Lanreotide Injection What is this medication? LANREOTIDE (lan REE oh tide) treats high levels of growth hormone (acromegaly). It is used when other therapies have not worked well enough or cannot be tolerated. It works by reducing the amount of growth hormone your body makes. This reduces symptoms and the risk of health problems caused by too much growth hormone, such as diabetes and heart disease. It may also be used to treat neuroendocrine tumors, a cancer of the cells that release hormones and other substances in your body. It works by slowing down the release of these substances from the cells. This slows tumor growth. It also decreases the symptoms of carcinoid syndrome, such as flushing or diarrhea. This medicine may be used for other purposes; ask your health care provider or pharmacist if you have questions. COMMON BRAND NAME(S): Somatuline [place] What should I tell my care team before I take this medication? They need to know if you have any of these conditions: Diabetes Gallbladder disease Heart disease Kidney disease Liver disease Thyroid disease An unusual or allergic reaction to lanreotide, other medications, foods, dyes, or preservatives Pregnant or trying to get pregnant Breast-feeding How should I use this medication? This medication is injected under the skin. It is given by your care team in a hospital or clinic setting. Talk to your care team about the use of this medication in children. Special care may be needed. Overdosage: If you think you have taken too much of this medicine contact a poison control center or emergency room at once. NOTE: This medicine is only for you. Do not share this medicine with others. What if I miss a dose? Keep appointments for follow-up doses. It is important not to miss your dose. Call your care team if you are unable to keep an appointment. What may interact with this medication? Bromocriptine Cyclosporine Certain medications for blood  pressure, heart disease, irregular heartbeat Certain medications for diabetes Quinidine Terfenadine This list may not describe all possible interactions. Give your health care provider a list of all the medicines, herbs, non-prescription drugs, or dietary supplements you use. Also tell them if you smoke, drink alcohol, or use illegal drugs. Some items may interact with your medicine. What should I watch for while using this medication? Visit your care team for regular checks on your progress. Tell your care team if your symptoms do not start to get better or if they get worse. Your condition will be monitored carefully while you are receiving this medication. You may need blood work while you are taking this medication. This medication may increase blood sugar. The risk may be higher in patients who already have diabetes. Ask your care team what you can do to lower your risk of diabetes while taking this medication. Talk to your care team if you wish to become pregnant or think you may be pregnant. This medication can cause serious birth defects. Do not breast-feed while taking this medication and for 6 months after stopping therapy. This medication may cause infertility. Talk to your care team if you are concerned about your fertility. What side effects may I notice from receiving this medication? Side effects that you should report to your care team as soon as possible: Allergic reactions--skin rash, itching, hives, swelling of the face, lips, tongue, or throat Gallbladder problems--severe stomach pain, nausea, vomiting, fever High blood sugar (hyperglycemia)--increased thirst or amount of urine, unusual weakness or fatigue, blurry vision Increase in blood pressure Low blood sugar (hypoglycemia)--tremors or shaking, anxiety, sweating, cold   or clammy skin, confusion, dizziness, rapid heartbeat Low thyroid levels (hypothyroidism)--unusual weakness or fatigue, increased sensitivity to cold,  constipation, hair loss, dry skin, weight gain, feelings of depression Slow heartbeat--dizziness, feeling faint or lightheaded, confusion, trouble breathing, unusual weakness or fatigue Side effects that usually do not require medical attention (report to your care team if they continue or are bothersome): Diarrhea Dizziness Headache Muscle spasms Nausea Pain, redness, irritation, or bruising at the injection site Stomach pain This list may not describe all possible side effects. Call your doctor for medical advice about side effects. You may report side effects to FDA at 1-800-FDA-1088. Where should I keep my medication? This medication is given in a hospital or clinic. It will not be stored at home. NOTE: This sheet is a summary. It may not cover all possible information. If you have questions about this medicine, talk to your doctor, pharmacist, or health care provider.  2023 Elsevier/Gold Standard (2021-07-21 00:00:00)  

## 2022-04-17 NOTE — Progress Notes (Signed)
Patient seen today for treatment #38 of Lanreotide acetate. Patient verbalized some discomfort during administration and that it felt like it was being administered on his hip. This nurse explained to patient that injection was being administered to his Right Upper Outer Buttocks, and that injection site pain was expected and would resolve. No bleeding or bruising noted post injection administration and this was communicated to patient. Patient knows to call the clinic with any concerns.

## 2022-04-24 ENCOUNTER — Encounter: Payer: Self-pay | Admitting: Oncology

## 2022-04-25 ENCOUNTER — Other Ambulatory Visit: Payer: Self-pay | Admitting: *Deleted

## 2022-04-25 DIAGNOSIS — D3A098 Benign carcinoid tumors of other sites: Secondary | ICD-10-CM

## 2022-04-25 DIAGNOSIS — C7B02 Secondary carcinoid tumors of liver: Secondary | ICD-10-CM

## 2022-04-25 NOTE — Progress Notes (Signed)
Received request from Dr. Deniece Ree in [place], [place] for lab work and MRI/PET. Orders placed. Informed William Hancock he will need to have the 5HIAA plasma, serotonin, and Pancreastatin done elsewhere as we do not process and send samples to [facility] He agrees and will have these done at [facility].

## 2022-05-01 DIAGNOSIS — M1310 Monoarthritis, not elsewhere classified, unspecified site: Secondary | ICD-10-CM | POA: Diagnosis not present

## 2022-05-01 DIAGNOSIS — L853 Xerosis cutis: Secondary | ICD-10-CM | POA: Diagnosis not present

## 2022-05-09 DIAGNOSIS — N179 Acute kidney failure, unspecified: Secondary | ICD-10-CM | POA: Diagnosis not present

## 2022-05-14 ENCOUNTER — Encounter: Payer: Self-pay | Admitting: *Deleted

## 2022-05-15 ENCOUNTER — Inpatient Hospital Stay: Payer: Federal, State, Local not specified - PPO

## 2022-05-15 ENCOUNTER — Inpatient Hospital Stay: Payer: Federal, State, Local not specified - PPO | Attending: Nurse Practitioner

## 2022-05-15 VITALS — BP 146/96 | HR 56 | Temp 98.1°F | Resp 18 | Ht 71.0 in | Wt 196.0 lb

## 2022-05-15 DIAGNOSIS — C7A098 Malignant carcinoid tumors of other sites: Secondary | ICD-10-CM | POA: Insufficient documentation

## 2022-05-15 DIAGNOSIS — C7B02 Secondary carcinoid tumors of liver: Secondary | ICD-10-CM | POA: Diagnosis not present

## 2022-05-15 DIAGNOSIS — D3A098 Benign carcinoid tumors of other sites: Secondary | ICD-10-CM

## 2022-05-15 DIAGNOSIS — C7B04 Secondary carcinoid tumors of peritoneum: Secondary | ICD-10-CM | POA: Diagnosis not present

## 2022-05-15 LAB — CBC WITH DIFFERENTIAL (CANCER CENTER ONLY)
Abs Immature Granulocytes: 0.07 10*3/uL (ref 0.00–0.07)
Basophils Absolute: 0.1 10*3/uL (ref 0.0–0.1)
Basophils Relative: 1 %
Eosinophils Absolute: 0 10*3/uL (ref 0.0–0.5)
Eosinophils Relative: 0 %
HCT: 39.7 % (ref 39.0–52.0)
Hemoglobin: 13.7 g/dL (ref 13.0–17.0)
Immature Granulocytes: 1 %
Lymphocytes Relative: 13 %
Lymphs Abs: 1.3 10*3/uL (ref 0.7–4.0)
MCH: 30.6 pg (ref 26.0–34.0)
MCHC: 34.5 g/dL (ref 30.0–36.0)
MCV: 88.8 fL (ref 80.0–100.0)
Monocytes Absolute: 0.5 10*3/uL (ref 0.1–1.0)
Monocytes Relative: 5 %
Neutro Abs: 8.3 10*3/uL — ABNORMAL HIGH (ref 1.7–7.7)
Neutrophils Relative %: 80 %
Platelet Count: 230 10*3/uL (ref 150–400)
RBC: 4.47 MIL/uL (ref 4.22–5.81)
RDW: 13.2 % (ref 11.5–15.5)
WBC Count: 10.3 10*3/uL (ref 4.0–10.5)
nRBC: 0 % (ref 0.0–0.2)

## 2022-05-15 LAB — CMP (CANCER CENTER ONLY)
ALT: 20 U/L (ref 0–44)
AST: 17 U/L (ref 15–41)
Albumin: 4.8 g/dL (ref 3.5–5.0)
Alkaline Phosphatase: 157 U/L — ABNORMAL HIGH (ref 38–126)
Anion gap: 11 (ref 5–15)
BUN: 18 mg/dL (ref 6–20)
CO2: 25 mmol/L (ref 22–32)
Calcium: 9.2 mg/dL (ref 8.9–10.3)
Chloride: 104 mmol/L (ref 98–111)
Creatinine: 1.17 mg/dL (ref 0.61–1.24)
GFR, Estimated: >60 mL/min (ref >60)
Glucose, Bld: 112 mg/dL — ABNORMAL HIGH (ref 70–99)
Potassium: 3.8 mmol/L (ref 3.5–5.1)
Sodium: 140 mmol/L (ref 135–145)
Total Bilirubin: 1.5 mg/dL — ABNORMAL HIGH (ref 0.3–1.2)
Total Protein: 7.7 g/dL (ref 6.5–8.1)

## 2022-05-15 MED ORDER — LANREOTIDE ACETATE 120 MG/0.5ML ~~LOC~~ SOLN
120.0000 mg | Freq: Once | SUBCUTANEOUS | Status: AC
  Administered 2022-05-15: 120 mg via SUBCUTANEOUS
  Filled 2022-05-15: qty 120

## 2022-05-15 NOTE — Patient Instructions (Signed)
Lanreotide Injection What is this medication? LANREOTIDE (lan REE oh tide) treats high levels of growth hormone (acromegaly). It is used when other therapies have not worked well enough or cannot be tolerated. It works by reducing the amount of growth hormone your body makes. This reduces symptoms and the risk of health problems caused by too much growth hormone, such as diabetes and heart disease. It may also be used to treat neuroendocrine tumors, a cancer of the cells that release hormones and other substances in your body. It works by slowing down the release of these substances from the cells. This slows tumor growth. It also decreases the symptoms of carcinoid syndrome, such as flushing or diarrhea. This medicine may be used for other purposes; ask your health care provider or pharmacist if you have questions. COMMON BRAND NAME(S): Somatuline [place] What should I tell my care team before I take this medication? They need to know if you have any of these conditions: Diabetes Gallbladder disease Heart disease Kidney disease Liver disease Thyroid disease An unusual or allergic reaction to lanreotide, other medications, foods, dyes, or preservatives Pregnant or trying to get pregnant Breast-feeding How should I use this medication? This medication is injected under the skin. It is given by your care team in a hospital or clinic setting. Talk to your care team about the use of this medication in children. Special care may be needed. Overdosage: If you think you have taken too much of this medicine contact a poison control center or emergency room at once. NOTE: This medicine is only for you. Do not share this medicine with others. What if I miss a dose? Keep appointments for follow-up doses. It is important not to miss your dose. Call your care team if you are unable to keep an appointment. What may interact with this medication? Bromocriptine Cyclosporine Certain medications for blood  pressure, heart disease, irregular heartbeat Certain medications for diabetes Quinidine Terfenadine This list may not describe all possible interactions. Give your health care provider a list of all the medicines, herbs, non-prescription drugs, or dietary supplements you use. Also tell them if you smoke, drink alcohol, or use illegal drugs. Some items may interact with your medicine. What should I watch for while using this medication? Visit your care team for regular checks on your progress. Tell your care team if your symptoms do not start to get better or if they get worse. Your condition will be monitored carefully while you are receiving this medication. You may need blood work while you are taking this medication. This medication may increase blood sugar. The risk may be higher in patients who already have diabetes. Ask your care team what you can do to lower your risk of diabetes while taking this medication. Talk to your care team if you wish to become pregnant or think you may be pregnant. This medication can cause serious birth defects. Do not breast-feed while taking this medication and for 6 months after stopping therapy. This medication may cause infertility. Talk to your care team if you are concerned about your fertility. What side effects may I notice from receiving this medication? Side effects that you should report to your care team as soon as possible: Allergic reactions--skin rash, itching, hives, swelling of the face, lips, tongue, or throat Gallbladder problems--severe stomach pain, nausea, vomiting, fever High blood sugar (hyperglycemia)--increased thirst or amount of urine, unusual weakness or fatigue, blurry vision Increase in blood pressure Low blood sugar (hypoglycemia)--tremors or shaking, anxiety, sweating, cold   or clammy skin, confusion, dizziness, rapid heartbeat Low thyroid levels (hypothyroidism)--unusual weakness or fatigue, increased sensitivity to cold,  constipation, hair loss, dry skin, weight gain, feelings of depression Slow heartbeat--dizziness, feeling faint or lightheaded, confusion, trouble breathing, unusual weakness or fatigue Side effects that usually do not require medical attention (report to your care team if they continue or are bothersome): Diarrhea Dizziness Headache Muscle spasms Nausea Pain, redness, irritation, or bruising at the injection site Stomach pain This list may not describe all possible side effects. Call your doctor for medical advice about side effects. You may report side effects to FDA at 1-800-FDA-1088. Where should I keep my medication? This medication is given in a hospital or clinic. It will not be stored at home. NOTE: This sheet is a summary. It may not cover all possible information. If you have questions about this medicine, talk to your doctor, pharmacist, or health care provider.  2023 Elsevier/Gold Standard (2021-07-21 00:00:00)  

## 2022-05-17 ENCOUNTER — Encounter: Payer: Self-pay | Admitting: Cardiology

## 2022-05-17 ENCOUNTER — Ambulatory Visit: Payer: Federal, State, Local not specified - PPO | Admitting: Cardiology

## 2022-05-17 VITALS — BP 145/95 | HR 61 | Ht 71.0 in | Wt 197.8 lb

## 2022-05-17 DIAGNOSIS — N182 Chronic kidney disease, stage 2 (mild): Secondary | ICD-10-CM | POA: Diagnosis not present

## 2022-05-17 DIAGNOSIS — E039 Hypothyroidism, unspecified: Secondary | ICD-10-CM | POA: Diagnosis not present

## 2022-05-17 DIAGNOSIS — I2510 Atherosclerotic heart disease of native coronary artery without angina pectoris: Secondary | ICD-10-CM | POA: Diagnosis not present

## 2022-05-17 DIAGNOSIS — I129 Hypertensive chronic kidney disease with stage 1 through stage 4 chronic kidney disease, or unspecified chronic kidney disease: Secondary | ICD-10-CM | POA: Diagnosis not present

## 2022-05-17 DIAGNOSIS — I10 Essential (primary) hypertension: Secondary | ICD-10-CM | POA: Diagnosis not present

## 2022-05-17 MED ORDER — LOSARTAN POTASSIUM 50 MG PO TABS: 50.0000 mg | ORAL_TABLET | Freq: Every day | ORAL | 3 refills | Status: DC

## 2022-05-17 NOTE — Progress Notes (Signed)
Subjective:   William Hancock, male    DOB: 08-Sep-1965, 56 y.o.   MRN: [id number]   Chief complaint:  Coronary artery disease   HPI  56 year old Caucasian male with hypertension, hyperlipidemia, coronary artery disease, s/p multivessel complex PCI (pro & mid RCA, prox-mid LAD/Diag bifurcation) for NSTEMI in 06/2018, metastatic neuroendocrine tumor, now s/p potentially curative surgery  Patient underwent complex surgery for neuroendocrine tumor in [place] in 01/2022. He had perioperative complications requiring 23 days ICU stay. However, he has recovered from all of this and if feeling great. Overall, the surgery was successful. He is going to have follow up MRI next month. Throughout all of the above, he did not have any perioperative cardiac events. He is increasing physical activity, denies chest pain, shortness of breath, palpitations, leg edema, orthopnea, PND, TIA/syncope. Blood pressure elevated today. He does not check it regularly.     Current Outpatient Medications:    amLODipine (NORVASC) 10 MG tablet, Take 1 tablet (10 mg total) by mouth daily., Disp: 90 tablet, Rfl: 3   ASPIRIN LOW DOSE 81 MG chewable tablet, Chew 81 mg by mouth in the morning., Disp: , Rfl:    atorvastatin (LIPITOR) 40 MG tablet, Take 1 tablet (40 mg total) by mouth daily., Disp: 90 tablet, Rfl: 3   cyanocobalamin (VITAMIN B12) 1000 MCG tablet, Take 1,000 mcg by mouth in the morning. sublinguinal, Disp: , Rfl:    levothyroxine (SYNTHROID) 125 MCG tablet, Take 125 mcg by mouth daily before breakfast., Disp: , Rfl:    loperamide (IMODIUM) 2 MG capsule, Take 2 mg by mouth 4 (four) times daily as needed for diarrhea or loose stools., Disp: , Rfl:    metoprolol succinate (TOPROL-XL) 25 MG 24 hr tablet, Take 1 tablet (25 mg total) by mouth in the morning and at bedtime., Disp: 180 tablet, Rfl: 3   nitroGLYCERIN (NITROSTAT) 0.4 MG SL tablet, Place 1 tablet (0.4 mg total) under the tongue every 5 (five) minutes x 3  doses as needed for chest pain., Disp: 25 tablet, Rfl: 3   Zinc 30 MG TABS, Take by mouth in the morning., Disp: , Rfl:   Cardiovascular studies:  EKG 05/17/2022: Sinus rhythm 58 bpm First degree A-V block  Nonspecific T-abnormality   Coronary intervention 06/09/2018: LM: Normal  LAD: Prox to mid LAD/Diag1 bifurcation severe calcific 80% stenosis        Diag 1 mid 75% stenosis        Atherectomy and prox LAD into Diag         Synergy DES 2.75 X 16 mm DES        Overlapping Stent into prox LAD Synergy DES 3.0 X 38 mm        Provisional stent mid LAD with minicrush technique Synergy DES 2.75 X 16 mm DES Ramus: 50 % proximal disease LCx: Mild luminal irregularities RCA: Stenting performed 06/06/2018        Severe mid 99% stenosis--->Orsero 3.5 X 26 mm Orsero DES         Post dilatation with 4.0 mm [place] balloon        Severe prox 80% stenosis--->Orsero 3.5 X 9 mm DES        Post dilatation with 4.0 mm [place] balloon   Recommendation: DAPT with Aspirin and brilinta for 1 year Aggressive risk factor modification  Hospital echocardiogram 06/06/2018: - Left ventricle: The cavity size was normal. There was mild   concentric hypertrophy. Systolic function was normal. The  estimated ejection fraction was in the range of 55% to 60%. Wall   motion was normal; there were no regional wall motion   abnormalities. Doppler parameters are consistent with abnormal   left ventricular relaxation (grade 1 diastolic dysfunction). - No significant valvular abnormality.  Recent labs: 05/15/2022: Glucose 112, BUN/Cr 18/1.17. EGFR >60. Na/K 140/3.8. AlKP 157. Rest of the CMP normal H/H 13/39. MCV 88. Platelets 230  11/03/2021: Chol 106, TG 88, HDL 42, LDL 47  03/21/2021: Glucose 101, BUN/Cr 22/1.39. EGFR 59. Na/K 140/4.8. T.bili 1.3. Rest of the CMP normal H/H 14/41. MCV 90. Platelets 186  Review of Systems  Review of Systems  Cardiovascular:  Negative for chest pain, dyspnea on exertion, leg  swelling, palpitations and syncope.         Vitals:   05/17/22 0844 05/17/22 0852  BP: (!) 153/97 (!) 145/95  Pulse: 61 61  SpO2: 99% 97%     Objective:   Physical Exam  Physical Exam Vitals and nursing note reviewed.  Constitutional:      General: He is not in acute distress.    Appearance: He is well-developed.  Neck:     Vascular: No JVD.  Cardiovascular:     Rate and Rhythm: Normal rate and regular rhythm.     Pulses: Intact distal pulses.     Heart sounds: Normal heart sounds. No murmur heard. Pulmonary:     Effort: Pulmonary effort is normal.     Breath sounds: Normal breath sounds. No wheezing or rales.  Musculoskeletal:     Right lower leg: No edema.     Left lower leg: No edema.            Assessment & Recommendations:   56 year old Caucasian male with hypertension, hyperlipidemia, coronary artery disease, s/p multivessel complex PCI (pro & mid RCA, prox-mid LAD/Diag bifurcation) for NSTEMI in 06/2018, metastatic neuroendocrine tumor, now s/p potentially curative surgery  CAD without angina: S/p multilvessel PCI for NSTEMI 06/2018 Doing well without angina symptoms Continue aspirin 81 mg daily.   >3 years since multivessel PCI. Okay to discontinue metoprolol.  Continue amlodipine 10 mg daily. Continue lipitor 40 mg daily.  Chol 106, TG 88, HDL 42, LDL 47 (11/2021).  Hypertension: Uncontrolled. Changed metoprolol to losartan 50 mg daily.  Check BMP in 1 week.  Metastatic neuroendocrine tumor: Now s/p surgery. Continue f/u w/Dr. Benay Spice.   BP check visit in 4 weeks F/u w/me in 6 months    Nigel Mormon, MD Pager: [phone] Office: [phone]

## 2022-05-22 ENCOUNTER — Other Ambulatory Visit: Payer: Self-pay | Admitting: *Deleted

## 2022-05-22 DIAGNOSIS — D3A098 Benign carcinoid tumors of other sites: Secondary | ICD-10-CM

## 2022-05-23 ENCOUNTER — Ambulatory Visit (HOSPITAL_COMMUNITY)
Admission: RE | Admit: 2022-05-23 | Discharge: 2022-05-23 | Disposition: A | Discharge status: Home / Self Care | Payer: Federal, State, Local not specified - PPO | Source: Ambulatory Visit | Attending: Oncology | Admitting: Oncology

## 2022-05-23 DIAGNOSIS — D3A098 Benign carcinoid tumors of other sites: Secondary | ICD-10-CM | POA: Diagnosis not present

## 2022-05-23 DIAGNOSIS — C7A8 Other malignant neuroendocrine tumors: Secondary | ICD-10-CM | POA: Diagnosis not present

## 2022-05-23 DIAGNOSIS — K769 Liver disease, unspecified: Secondary | ICD-10-CM | POA: Diagnosis not present

## 2022-05-23 DIAGNOSIS — C787 Secondary malignant neoplasm of liver and intrahepatic bile duct: Secondary | ICD-10-CM | POA: Diagnosis not present

## 2022-05-23 DIAGNOSIS — Z8603 Personal history of neoplasm of uncertain behavior: Secondary | ICD-10-CM | POA: Diagnosis not present

## 2022-05-23 MED ORDER — GADOBUTROL 1 MMOL/ML IV SOLN
9.0000 mL | Freq: Once | INTRAVENOUS | Status: AC | PRN
  Administered 2022-05-23: 9 mL via INTRAVENOUS

## 2022-05-30 ENCOUNTER — Encounter: Payer: Self-pay | Admitting: *Deleted

## 2022-05-30 NOTE — Progress Notes (Signed)
Faxed copy of PET scan to Dr. Deniece Ree and left message for radiology to push over images to her at [facility] in [place], [place]

## 2022-06-07 DIAGNOSIS — L570 Actinic keratosis: Secondary | ICD-10-CM | POA: Diagnosis not present

## 2022-06-07 DIAGNOSIS — L821 Other seborrheic keratosis: Secondary | ICD-10-CM | POA: Diagnosis not present

## 2022-06-07 DIAGNOSIS — L814 Other melanin hyperpigmentation: Secondary | ICD-10-CM | POA: Diagnosis not present

## 2022-06-07 DIAGNOSIS — D225 Melanocytic nevi of trunk: Secondary | ICD-10-CM | POA: Diagnosis not present

## 2022-06-11 ENCOUNTER — Ambulatory Visit (HOSPITAL_COMMUNITY)
Admission: RE | Admit: 2022-06-11 | Discharge: 2022-06-11 | Disposition: A | Discharge status: Home / Self Care | Payer: Federal, State, Local not specified - PPO | Source: Ambulatory Visit | Attending: Oncology | Admitting: Oncology

## 2022-06-11 DIAGNOSIS — Z8603 Personal history of neoplasm of uncertain behavior: Secondary | ICD-10-CM | POA: Diagnosis not present

## 2022-06-11 DIAGNOSIS — D3A098 Benign carcinoid tumors of other sites: Secondary | ICD-10-CM | POA: Diagnosis not present

## 2022-06-11 DIAGNOSIS — Z8505 Personal history of malignant neoplasm of liver: Secondary | ICD-10-CM | POA: Diagnosis not present

## 2022-06-11 DIAGNOSIS — C7B02 Secondary carcinoid tumors of liver: Secondary | ICD-10-CM

## 2022-06-11 DIAGNOSIS — C787 Secondary malignant neoplasm of liver and intrahepatic bile duct: Secondary | ICD-10-CM | POA: Diagnosis not present

## 2022-06-11 DIAGNOSIS — C7A1 Malignant poorly differentiated neuroendocrine tumors: Secondary | ICD-10-CM | POA: Diagnosis not present

## 2022-06-11 MED ORDER — COPPER CU 64 DOTATATE 1 MCI/ML IV SOLN
4.0000 | Freq: Once | INTRAVENOUS | Status: AC
  Administered 2022-06-11: 3.85 via INTRAVENOUS

## 2022-06-11 MED ORDER — COPPER CU 64 DOTATATE 1 MCI/ML IV SOLN: 4.0000 | Freq: Once | INTRAVENOUS | Status: DC

## 2022-06-12 ENCOUNTER — Inpatient Hospital Stay: Payer: Federal, State, Local not specified - PPO

## 2022-06-12 ENCOUNTER — Inpatient Hospital Stay: Payer: Federal, State, Local not specified - PPO | Attending: Nurse Practitioner | Admitting: Oncology

## 2022-06-12 VITALS — BP 123/93 | HR 66 | Temp 98.2°F | Resp 18 | Ht 71.0 in | Wt 195.0 lb

## 2022-06-12 DIAGNOSIS — C7B04 Secondary carcinoid tumors of peritoneum: Secondary | ICD-10-CM | POA: Insufficient documentation

## 2022-06-12 DIAGNOSIS — Z79899 Other long term (current) drug therapy: Secondary | ICD-10-CM | POA: Insufficient documentation

## 2022-06-12 DIAGNOSIS — C7B02 Secondary carcinoid tumors of liver: Secondary | ICD-10-CM | POA: Diagnosis not present

## 2022-06-12 DIAGNOSIS — C7A098 Malignant carcinoid tumors of other sites: Secondary | ICD-10-CM | POA: Diagnosis not present

## 2022-06-12 DIAGNOSIS — I252 Old myocardial infarction: Secondary | ICD-10-CM | POA: Insufficient documentation

## 2022-06-12 DIAGNOSIS — D3A098 Benign carcinoid tumors of other sites: Secondary | ICD-10-CM

## 2022-06-12 DIAGNOSIS — R197 Diarrhea, unspecified: Secondary | ICD-10-CM | POA: Insufficient documentation

## 2022-06-12 DIAGNOSIS — I2510 Atherosclerotic heart disease of native coronary artery without angina pectoris: Secondary | ICD-10-CM | POA: Insufficient documentation

## 2022-06-12 DIAGNOSIS — I10 Essential (primary) hypertension: Secondary | ICD-10-CM | POA: Diagnosis not present

## 2022-06-12 DIAGNOSIS — E039 Hypothyroidism, unspecified: Secondary | ICD-10-CM | POA: Insufficient documentation

## 2022-06-12 MED ORDER — LANREOTIDE ACETATE 120 MG/0.5ML ~~LOC~~ SOLN
120.0000 mg | Freq: Once | SUBCUTANEOUS | Status: AC
  Administered 2022-06-12: 120 mg via SUBCUTANEOUS

## 2022-06-12 NOTE — Progress Notes (Signed)
William OFFICE PROGRESS NOTE   Diagnosis: Carcinoid tumor  INTERVAL HISTORY:   William Hancock returns as scheduled.  He feels well.  He continues monthly Sandostatin.  He has intermittent loose stool.  Good appetite and energy level.  He is scheduled for a telehealth visit with Dr.Maluccio today.  Objective:  Vital signs in last 24 hours:  Blood pressure (!) 123/93, pulse 66, temperature 98.2 F (36.8 C), temperature source Oral, resp. rate 18, height '5\' 11"'$  (1.803 m), weight 195 lb (88.5 kg), SpO2 100 %.    Resp: Lungs clear bilaterally Cardio: Regular rate and rhythm GI: Nontender, no mass, no hepatosplenomegaly Vascular: No leg edema   Lab Results:  Lab Results  Component Value Date   WBC 10.3 05/15/2022   HGB 13.7 05/15/2022   HCT 39.7 05/15/2022   MCV 88.8 05/15/2022   PLT 230 05/15/2022   NEUTROABS 8.3 (H) 05/15/2022    CMP  Lab Results  Component Value Date   NA 140 05/15/2022   K 3.8 05/15/2022   CL 104 05/15/2022   CO2 25 05/15/2022   GLUCOSE 112 (H) 05/15/2022   BUN 18 05/15/2022   CREATININE 1.17 05/15/2022   CALCIUM 9.2 05/15/2022   PROT 7.7 05/15/2022   ALBUMIN 4.8 05/15/2022   AST 17 05/15/2022   ALT 20 05/15/2022   ALKPHOS 157 (H) 05/15/2022   BILITOT 1.5 (H) 05/15/2022   GFRNONAA >60 05/15/2022   GFRAA >60 02/26/2020    No results found for: "CEA1", "CEA", "BSJ628", "CA125"  Lab Results  Component Value Date   INR 1.0 11/13/2018   LABPROT 13.0 11/13/2018    Imaging:  NM PET DOTATATE SKULL BASE TO MID THIGH  Result Date: 06/11/2022 CLINICAL DATA:  Subsequent treatment strategy for well differentiated neuroendocrine tumor. Status post peptide receptor radiotherapy and bland embolization of liver metastasis. Patient status post small bowel resection. EXAM: NUCLEAR MEDICINE PET SKULL BASE TO THIGH TECHNIQUE: 3.9 mCi copper 64 DOTATATE was injected intravenously. Full-ring PET imaging was performed from the skull base to  thigh after the radiotracer. CT data was obtained and used for attenuation correction and anatomic localization. COMPARISON:  None Available. FINDINGS: NECK No radiotracer activity in neck lymph nodes. Incidental CT findings: None CHEST No radiotracer accumulation within mediastinal or hilar lymph nodes. No suspicious pulmonary nodules on the CT scan. Incidental CT finding:None ABDOMEN/PELVIS Marked reduction in the number of radiotracer avid lesions within the liver. Additional is volume reduction within the liver consistent with partial hepatectomy. Lesions which remain radiotracer avid are within the RIGHT hepatic lobe. There is a linear band of radiotracer avid lesions in the central RIGHT hepatic lobe (segment 5 and 8) with SUV max equal 19.2. Lesion in the subcapsular more lateral RIGHT hepatic lobe with SUV max equal 23.2. comparable lesion in the RIGHT hepatic lobe on comparison exam with SUV max equal 18.2. Interval resection of the central mesenteric mass. No radiotracer avid mesenteric lesions. Radiotracer avid lesions in the small bowel. Postsurgical anatomy consistent with partial small bowel resection. Physiologic activity noted in the liver, spleen, adrenal glands and kidneys. Incidental CT findings:None SKELETON No focal activity to suggest skeletal metastasis. Incidental CT findings:None IMPRESSION: 1. Marked decrease in number of radiotracer avid metastasis within liver. Remaining metastatic lesions remain in the RIGHT hepatic lobe with similar activity compared to comparison DOTATATE PET scan. 2. Interval resection of central mesenteric neuroendocrine tumor and partial small bowel resection. No mesenteric or small bowel lesions present. Electronically Signed  By: Suzy Bouchard M.D.   On: 06/11/2022 10:27    Medications: I have reviewed the patient's current medications.   Assessment/Plan: Metastatic neuroendocrine tumor, well-differentiated 10/31/2018 Abdominal ultrasound-numerous nearly  confluent heterogeneous masses measuring up to 3.5 cm.   11/04/2018 CT abdomen/pelvis-multiple enhancing hepatic lesions and a central partially calcified spiculated mesenteric mass measuring 3.1 x 3.1 cm.   11/13/2018 biopsy liver lesion-metastatic well-differentiated neuroendocrine tumor positive for CKAE1-AE3, synaptophysin, chromogranin, CD56 and CDX-2; Ki-67 labeling index less than 3%; TTF-1 negative Netspot 12/12/2018- increased activity associated a lobular mesenteric mass, small bowel medial to the mesenteric mass, and multiple liver lesions Elevated chromogranin A level and 24-hour urine 5 HIAA Monthly lanreotide beginning 12/23/2018 CT 05/13/2019-multiple large hepatic metastases on the dotatate are poorly visualized, several small lesions are decreased in size 1 new lesion, stable mesenteric mass Netspot 09/04/2019-increase in size of left and right liver lesions, stable to slightly decreased radiotracer accumulation, stable central mesenteric mass, no new metastatic lesions Lutathera 10/13/2019 Lutathera 12/04/2019 Lutathera 01/29/2020 Lutathera 03/25/2020 Restaging Netspot 04/22/2020-overall positive measurable response to therapy.  Multiple hepatic metastasis continue to demonstrate intense radiotracer activity, the activity is consistently decreased from preradiotherapy scan.  No evidence of progressive disease in the liver.  Central mesenteric metastasis and adjacent small bowel lesion have very similar metabolic activity and no change in size.  No evidence of new peritoneal metastasis or skeletal metastasis. Monthly lanreotide continued Netspot 03/02/2021-stable radiotracer avid confluent left and right liver lesions, stable central mesenteric nodal metastasis, no new sites of metastatic disease Monthly lanreotide continued MRI abdomen 05/22/2021-innumerable confluent contrast-enhancing liver lesions-not significantly changed compared to the PET/CT, stable mesenteric metastasis 07/19/2021-bland  embolization of right hepatic metastases with micro particles in [place] 08/16/2021-MRI abdomen without contrast-innumerable hepatic masses, decreased in size and overall bulk, especially in the right hepatic segments 5-8.  Stable central mesenteric mass 11/20/2021-dotatate PET-confluent radiotracer uptake at the right hepatic dome, elongated area of radiotracer uptake at the anterior superior left hepatic lobe, small focus of uptake at the caudate lobe, radiotracer uptake associated with a partially calcified mesenteric mass and adjacent small bowel loop, no new sites of metastatic disease 11/21/2021-bland bleed/particle embolization of the left hepatic artery 01/24/2022-small bowel resection with anastomosis, cholecystectomy and hepatic metastasectomy (gallbladder with chronic cholecystitis with cholelithiasis; mesenteric lymph nodes with 2 of 22 lymph nodes positive for metastatic well differentiated neuroendocrine tumor; small bowel resection with well-differentiated neuroendocrine tumor, 2 cm, WHO grade 2, Ki-67 index approximately 3%, tumor extends from the mucosa to the serosa, mesenteric tumor deposit with necrosis and calcifications, 6 cm in greatest diameter (Ki-67 index approximately 8%), lymphovascular invasion present, perineural invasion present, margins negative for tumor; additional segment small bowel resection-benign small bowel with normal villous architecture and no significant histopathologic change, negative for tumor; liver segment 2 resection with metastatic well differentiated neuroendocrine tumor with extensive necrosis, 2.5 cm in greatest diameter; liver additional segment 2 resection metastatic well-differentiated neuroendocrine tumor with extensive necrosis, 5.1 cm in greatest diameter ERCP 02/02/2022, stent placed (report could not be located in care everywhere) 02/02/2022 exploratory laparotomy with small bowel resection-gangrenous necrosis with acute chronic inflammation; acute  serositis with adhesions 02/04/2022 exploratory laparotomy with small bowel resection, appendectomy and small bowel-small bowel anastomosis-benign appendix with periappendiceal subacute and chronic hemorrhagic serosal reaction, no evidence of carcinoid/neuroendocrine tumor Decreased chromogranin A 03/21/2022 MRI abdomen 05/23/2022-overall decrease in size of innumerable bilobar hepatic metastases, no new hepatic lesions, postsurgical changes with interval resection of the central mesenteric mass Dotatate PET 06/11/2022-decrease in  number of radiotracer avid metastases in the liver, remaining metastatic lesions in the right liver, interval resection of central mesenteric mass with partial small bowel resection, no mesenteric or small bowel lesions present History of MI/stent placement CAD Hypertension Renal insufficiency Hypothyroidism diagnosed with markedly elevated TSH and low T4 on 12/18/2018 Acute left elbow/right ankle pain and swelling August 2021 February 2023-acute renal failure following liver embolization procedure, improved       Disposition: Mr. [place] Hancock appears stable.  I reviewed the restaging dotatate images with him.  The carcinoid tumor burden has improved following the hepatic and small bowel surgery.  Chromogranin A is lower.  He will continue monthly lanreotide.  We will check a chromogranin A level in 3 months.  He will be scheduled for an office visit in 4 months. Betsy Coder, MD  06/12/2022  8:13 AM

## 2022-06-12 NOTE — Patient Instructions (Signed)
Lanreotide Injection What is this medication? LANREOTIDE (lan REE oh tide) treats high levels of growth hormone (acromegaly). It is used when other therapies have not worked well enough or cannot be tolerated. It works by reducing the amount of growth hormone your body makes. This reduces symptoms and the risk of health problems caused by too much growth hormone, such as diabetes and heart disease. It may also be used to treat neuroendocrine tumors, a cancer of the cells that release hormones and other substances in your body. It works by slowing down the release of these substances from the cells. This slows tumor growth. It also decreases the symptoms of carcinoid syndrome, such as flushing or diarrhea. This medicine may be used for other purposes; ask your health care provider or pharmacist if you have questions. COMMON BRAND NAME(S): Somatuline [place] What should I tell my care team before I take this medication? They need to know if you have any of these conditions: Diabetes Gallbladder disease Heart disease Kidney disease Liver disease Thyroid disease An unusual or allergic reaction to lanreotide, other medications, foods, dyes, or preservatives Pregnant or trying to get pregnant Breast-feeding How should I use this medication? This medication is injected under the skin. It is given by your care team in a hospital or clinic setting. Talk to your care team about the use of this medication in children. Special care may be needed. Overdosage: If you think you have taken too much of this medicine contact a poison control center or emergency room at once. NOTE: This medicine is only for you. Do not share this medicine with others. What if I miss a dose? Keep appointments for follow-up doses. It is important not to miss your dose. Call your care team if you are unable to keep an appointment. What may interact with this medication? Bromocriptine Cyclosporine Certain medications for blood  pressure, heart disease, irregular heartbeat Certain medications for diabetes Quinidine Terfenadine This list may not describe all possible interactions. Give your health care provider a list of all the medicines, herbs, non-prescription drugs, or dietary supplements you use. Also tell them if you smoke, drink alcohol, or use illegal drugs. Some items may interact with your medicine. What should I watch for while using this medication? Visit your care team for regular checks on your progress. Tell your care team if your symptoms do not start to get better or if they get worse. Your condition will be monitored carefully while you are receiving this medication. You may need blood work while you are taking this medication. This medication may increase blood sugar. The risk may be higher in patients who already have diabetes. Ask your care team what you can do to lower your risk of diabetes while taking this medication. Talk to your care team if you wish to become pregnant or think you may be pregnant. This medication can cause serious birth defects. Do not breast-feed while taking this medication and for 6 months after stopping therapy. This medication may cause infertility. Talk to your care team if you are concerned about your fertility. What side effects may I notice from receiving this medication? Side effects that you should report to your care team as soon as possible: Allergic reactions--skin rash, itching, hives, swelling of the face, lips, tongue, or throat Gallbladder problems--severe stomach pain, nausea, vomiting, fever High blood sugar (hyperglycemia)--increased thirst or amount of urine, unusual weakness or fatigue, blurry vision Increase in blood pressure Low blood sugar (hypoglycemia)--tremors or shaking, anxiety, sweating, cold   or clammy skin, confusion, dizziness, rapid heartbeat Low thyroid levels (hypothyroidism)--unusual weakness or fatigue, increased sensitivity to cold,  constipation, hair loss, dry skin, weight gain, feelings of depression Slow heartbeat--dizziness, feeling faint or lightheaded, confusion, trouble breathing, unusual weakness or fatigue Side effects that usually do not require medical attention (report to your care team if they continue or are bothersome): Diarrhea Dizziness Headache Muscle spasms Nausea Pain, redness, irritation, or bruising at the injection site Stomach pain This list may not describe all possible side effects. Call your doctor for medical advice about side effects. You may report side effects to FDA at 1-800-FDA-1088. Where should I keep my medication? This medication is given in a hospital or clinic. It will not be stored at home. NOTE: This sheet is a summary. It may not cover all possible information. If you have questions about this medicine, talk to your doctor, pharmacist, or health care provider.  2023 Elsevier/Gold Standard (2021-07-21 00:00:00)  

## 2022-06-13 ENCOUNTER — Encounter: Payer: Self-pay | Admitting: *Deleted

## 2022-06-13 NOTE — Progress Notes (Signed)
Faxed PET report of 06/11/22 and 06/12/22 office note to Dr. Edman Circle Maluccio at [phone]. Radiology is pushing images over to [facility].

## 2022-06-18 ENCOUNTER — Ambulatory Visit: Payer: Federal, State, Local not specified - PPO | Admitting: Cardiology

## 2022-06-21 ENCOUNTER — Encounter: Payer: Self-pay | Admitting: Cardiology

## 2022-06-21 ENCOUNTER — Ambulatory Visit: Payer: Federal, State, Local not specified - PPO | Admitting: Cardiology

## 2022-06-21 VITALS — BP 133/94 | HR 69 | Resp 16 | Ht 71.0 in | Wt 196.0 lb

## 2022-06-21 DIAGNOSIS — I10 Essential (primary) hypertension: Secondary | ICD-10-CM

## 2022-06-21 DIAGNOSIS — I2510 Atherosclerotic heart disease of native coronary artery without angina pectoris: Secondary | ICD-10-CM

## 2022-06-21 MED ORDER — LOSARTAN POTASSIUM 50 MG PO TABS: 50.0000 mg | ORAL_TABLET | Freq: Every day | ORAL | 3 refills | Status: DC

## 2022-06-21 NOTE — Progress Notes (Signed)
Subjective:   William Hancock, male    DOB: 12/13/1965, 57 y.o.   MRN: [id number]   Chief complaint:  Coronary artery disease   HPI  57 year old Caucasian male with hypertension, hyperlipidemia, coronary artery disease, s/p multivessel complex PCI (pro & mid RCA, prox-mid LAD/Diag bifurcation) for NSTEMI in 06/2018, metastatic neuroendocrine tumor, now s/p potentially curative surgery  Patient denies any symptoms. Blood pressure is elevated.     Current Outpatient Medications:    amLODipine (NORVASC) 10 MG tablet, Take 1 tablet (10 mg total) by mouth daily., Disp: 90 tablet, Rfl: 3   ASPIRIN LOW DOSE 81 MG chewable tablet, Chew 81 mg by mouth in the morning., Disp: , Rfl:    atorvastatin (LIPITOR) 40 MG tablet, Take 1 tablet (40 mg total) by mouth daily., Disp: 90 tablet, Rfl: 3   cyanocobalamin (VITAMIN B12) 1000 MCG tablet, Take 1,000 mcg by mouth in the morning. sublinguinal, Disp: , Rfl:    levothyroxine (SYNTHROID) 125 MCG tablet, Take 125 mcg by mouth daily before breakfast., Disp: , Rfl:    losartan (COZAAR) 50 MG tablet, Take 1 tablet (50 mg total) by mouth daily., Disp: 30 tablet, Rfl: 3   nitroGLYCERIN (NITROSTAT) 0.4 MG SL tablet, Place 1 tablet (0.4 mg total) under the tongue every 5 (five) minutes x 3 doses as needed for chest pain., Disp: 25 tablet, Rfl: 3   Zinc 30 MG TABS, Take by mouth in the morning., Disp: , Rfl:   Cardiovascular studies:  EKG 05/17/2022: Sinus rhythm 58 bpm First degree A-V block  Nonspecific T-abnormality   Coronary intervention 06/09/2018: LM: Normal  LAD: Prox to mid LAD/Diag1 bifurcation severe calcific 80% stenosis        Diag 1 mid 75% stenosis        Atherectomy and prox LAD into Diag         Synergy DES 2.75 X 16 mm DES        Overlapping Stent into prox LAD Synergy DES 3.0 X 38 mm        Provisional stent mid LAD with minicrush technique Synergy DES 2.75 X 16 mm DES Ramus: 50 % proximal disease LCx: Mild luminal  irregularities RCA: Stenting performed 06/06/2018        Severe mid 99% stenosis--->Orsero 3.5 X 26 mm Orsero DES         Post dilatation with 4.0 mm [place] balloon        Severe prox 80% stenosis--->Orsero 3.5 X 9 mm DES        Post dilatation with 4.0 mm  balloon   Recommendation: DAPT with Aspirin and brilinta for 1 year Aggressive risk factor modification  Hospital echocardiogram 06/06/2018: - Left ventricle: The cavity size was normal. There was mild   concentric hypertrophy. Systolic function was normal. The   estimated ejection fraction was in the range of 55% to 60%. Wall   motion was normal; there were no regional wall motion   abnormalities. Doppler parameters are consistent with abnormal   left ventricular relaxation (grade 1 diastolic dysfunction). - No significant valvular abnormality.  Recent labs: 05/15/2022: Glucose 112, BUN/Cr 18/1.17. EGFR >60. Na/K 140/3.8. AlKP 157. Rest of the CMP normal H/H 13/39. MCV 88. Platelets 230  11/03/2021: Chol 106, TG 88, HDL 42, LDL 47  03/21/2021: Glucose 101, BUN/Cr 22/1.39. EGFR 59. Na/K 140/4.8. T.bili 1.3. Rest of the CMP normal H/H 14/41. MCV 90. Platelets 186  Review of Systems  Review of Systems  Cardiovascular:  Negative for chest pain, dyspnea on exertion, leg swelling, palpitations and syncope.         Vitals:   06/21/22 1404 06/21/22 1407  BP: (!) 153/95 (!) 133/94  Pulse: 69 69  Resp: 16   SpO2: 99%      Objective:   Physical Exam  Physical Exam Vitals and nursing note reviewed.  Constitutional:      General: He is not in acute distress.    Appearance: He is well-developed.  Neck:     Vascular: No JVD.  Cardiovascular:     Rate and Rhythm: Normal rate and regular rhythm.     Pulses: Intact distal pulses.     Heart sounds: Normal heart sounds. No murmur heard. Pulmonary:     Effort: Pulmonary effort is normal.     Breath sounds: Normal breath sounds. No wheezing or rales.  Musculoskeletal:      Right lower leg: No edema.     Left lower leg: No edema.            Assessment & Recommendations:   57 year old Caucasian male with hypertension, hyperlipidemia, coronary artery disease, s/p multivessel complex PCI (pro & mid RCA, prox-mid LAD/Diag bifurcation) for NSTEMI in 06/2018, metastatic neuroendocrine tumor, now s/p potentially curative surgery  CAD without angina: S/p multilvessel PCI for NSTEMI 06/2018 Doing well without angina symptoms Continue aspirin 81 mg daily.   Continue amlodipine 10 mg daily. Continue lipitor 40 mg daily.  Chol 106, TG 88, HDL 42, LDL 47 (11/2021).  Hypertension: Uncontrolled. Resume metoprolol succinate, patient takes it 25 mg bid. Continue amlodipine 10 mg in am and losartan 50 mg in pm. Check lipid panel. Patient will send my MyChart message in 2 weeks. If SBP consisently >140 mmHg, will increase losartan to 100 mg daily, or add HCTZ in future.  Metastatic neuroendocrine tumor: Now s/p surgery. Continue f/u w/Dr. Benay Spice.   F/u w/me in 3 months    Nigel Mormon, MD Pager: [phone] Office: [phone]

## 2022-06-22 DIAGNOSIS — I10 Essential (primary) hypertension: Secondary | ICD-10-CM | POA: Diagnosis not present

## 2022-06-22 DIAGNOSIS — C7A8 Other malignant neuroendocrine tumors: Secondary | ICD-10-CM | POA: Diagnosis not present

## 2022-06-22 DIAGNOSIS — C7880 Secondary malignant neoplasm of unspecified digestive organ: Secondary | ICD-10-CM | POA: Diagnosis not present

## 2022-06-23 LAB — BASIC METABOLIC PANEL
BUN/Creatinine Ratio: 10 (ref 9–20)
BUN: 14 mg/dL (ref 6–24)
CO2: 22 mmol/L (ref 20–29)
Calcium: 8.5 mg/dL — ABNORMAL LOW (ref 8.7–10.2)
Chloride: 105 mmol/L (ref 96–106)
Creatinine, Ser: 1.39 mg/dL — ABNORMAL HIGH (ref 0.76–1.27)
Glucose: 90 mg/dL (ref 70–99)
Potassium: 4 mmol/L (ref 3.5–5.2)
Sodium: 143 mmol/L (ref 134–144)
eGFR: 59 mL/min/{1.73_m2} — ABNORMAL LOW (ref >59)

## 2022-06-28 ENCOUNTER — Other Ambulatory Visit: Payer: Self-pay | Admitting: *Deleted

## 2022-06-28 DIAGNOSIS — C7B02 Secondary carcinoid tumors of liver: Secondary | ICD-10-CM

## 2022-06-28 DIAGNOSIS — H35033 Hypertensive retinopathy, bilateral: Secondary | ICD-10-CM | POA: Diagnosis not present

## 2022-06-28 NOTE — Progress Notes (Signed)
Faxed request from Dr. Franco Nones requesting MRI abdomen w/wo contrast in 3 months. Order placed.

## 2022-07-02 ENCOUNTER — Other Ambulatory Visit: Payer: Self-pay | Admitting: Cardiology

## 2022-07-02 ENCOUNTER — Encounter: Payer: Self-pay | Admitting: Cardiology

## 2022-07-02 DIAGNOSIS — I10 Essential (primary) hypertension: Secondary | ICD-10-CM

## 2022-07-02 NOTE — Telephone Encounter (Signed)
From patient.

## 2022-07-17 ENCOUNTER — Inpatient Hospital Stay: Payer: Federal, State, Local not specified - PPO | Attending: Nurse Practitioner

## 2022-07-17 VITALS — BP 134/91 | HR 62 | Temp 97.6°F | Resp 18

## 2022-07-17 DIAGNOSIS — C7B02 Secondary carcinoid tumors of liver: Secondary | ICD-10-CM | POA: Diagnosis not present

## 2022-07-17 DIAGNOSIS — C7A098 Malignant carcinoid tumors of other sites: Secondary | ICD-10-CM | POA: Insufficient documentation

## 2022-07-17 DIAGNOSIS — D3A098 Benign carcinoid tumors of other sites: Secondary | ICD-10-CM

## 2022-07-17 DIAGNOSIS — C7B04 Secondary carcinoid tumors of peritoneum: Secondary | ICD-10-CM | POA: Diagnosis not present

## 2022-07-17 MED ORDER — LANREOTIDE ACETATE 120 MG/0.5ML ~~LOC~~ SOLN
120.0000 mg | Freq: Once | SUBCUTANEOUS | Status: AC
  Administered 2022-07-17: 120 mg via SUBCUTANEOUS
  Filled 2022-07-17: qty 120

## 2022-07-17 NOTE — Patient Instructions (Signed)
Lanreotide Injection What is this medication? LANREOTIDE (lan REE oh tide) treats high levels of growth hormone (acromegaly). It is used when other therapies have not worked well enough or cannot be tolerated. It works by reducing the amount of growth hormone your body makes. This reduces symptoms and the risk of health problems caused by too much growth hormone, such as diabetes and heart disease. It may also be used to treat neuroendocrine tumors, a cancer of the cells that release hormones and other substances in your body. It works by slowing down the release of these substances from the cells. This slows tumor growth. It also decreases the symptoms of carcinoid syndrome, such as flushing or diarrhea. This medicine may be used for other purposes; ask your health care provider or pharmacist if you have questions. COMMON BRAND NAME(S): Somatuline [place] What should I tell my care team before I take this medication? They need to know if you have any of these conditions: Diabetes Gallbladder disease Heart disease Kidney disease Liver disease Thyroid disease An unusual or allergic reaction to lanreotide, other medications, foods, dyes, or preservatives Pregnant or trying to get pregnant Breast-feeding How should I use this medication? This medication is injected under the skin. It is given by your care team in a hospital or clinic setting. Talk to your care team about the use of this medication in children. Special care may be needed. Overdosage: If you think you have taken too much of this medicine contact a poison control center or emergency room at once. NOTE: This medicine is only for you. Do not share this medicine with others. What if I miss a dose? Keep appointments for follow-up doses. It is important not to miss your dose. Call your care team if you are unable to keep an appointment. What may interact with this medication? Bromocriptine Cyclosporine Certain medications for blood  pressure, heart disease, irregular heartbeat Certain medications for diabetes Quinidine Terfenadine This list may not describe all possible interactions. Give your health care provider a list of all the medicines, herbs, non-prescription drugs, or dietary supplements you use. Also tell them if you smoke, drink alcohol, or use illegal drugs. Some items may interact with your medicine. What should I watch for while using this medication? Visit your care team for regular checks on your progress. Tell your care team if your symptoms do not start to get better or if they get worse. Your condition will be monitored carefully while you are receiving this medication. You may need blood work while you are taking this medication. This medication may increase blood sugar. The risk may be higher in patients who already have diabetes. Ask your care team what you can do to lower your risk of diabetes while taking this medication. Talk to your care team if you wish to become pregnant or think you may be pregnant. This medication can cause serious birth defects. Do not breast-feed while taking this medication and for 6 months after stopping therapy. This medication may cause infertility. Talk to your care team if you are concerned about your fertility. What side effects may I notice from receiving this medication? Side effects that you should report to your care team as soon as possible: Allergic reactions--skin rash, itching, hives, swelling of the face, lips, tongue, or throat Gallbladder problems--severe stomach pain, nausea, vomiting, fever High blood sugar (hyperglycemia)--increased thirst or amount of urine, unusual weakness or fatigue, blurry vision Increase in blood pressure Low blood sugar (hypoglycemia)--tremors or shaking, anxiety, sweating, cold   or clammy skin, confusion, dizziness, rapid heartbeat Low thyroid levels (hypothyroidism)--unusual weakness or fatigue, increased sensitivity to cold,  constipation, hair loss, dry skin, weight gain, feelings of depression Slow heartbeat--dizziness, feeling faint or lightheaded, confusion, trouble breathing, unusual weakness or fatigue Side effects that usually do not require medical attention (report to your care team if they continue or are bothersome): Diarrhea Dizziness Headache Muscle spasms Nausea Pain, redness, irritation, or bruising at the injection site Stomach pain This list may not describe all possible side effects. Call your doctor for medical advice about side effects. You may report side effects to FDA at 1-800-FDA-1088. Where should I keep my medication? This medication is given in a hospital or clinic. It will not be stored at home. NOTE: This sheet is a summary. It may not cover all possible information. If you have questions about this medicine, talk to your doctor, pharmacist, or health care provider.  2023 Elsevier/Gold Standard (2021-07-21 00:00:00)  

## 2022-07-25 ENCOUNTER — Encounter: Payer: Self-pay | Admitting: Cardiology

## 2022-07-25 DIAGNOSIS — I10 Essential (primary) hypertension: Secondary | ICD-10-CM | POA: Diagnosis not present

## 2022-07-26 LAB — BASIC METABOLIC PANEL
BUN/Creatinine Ratio: 10 (ref 9–20)
BUN: 14 mg/dL (ref 6–24)
CO2: 22 mmol/L (ref 20–29)
Calcium: 9.7 mg/dL (ref 8.7–10.2)
Chloride: 104 mmol/L (ref 96–106)
Creatinine, Ser: 1.45 mg/dL — ABNORMAL HIGH (ref 0.76–1.27)
Glucose: 99 mg/dL (ref 70–99)
Potassium: 4.8 mmol/L (ref 3.5–5.2)
Sodium: 142 mmol/L (ref 134–144)
eGFR: 56 mL/min/{1.73_m2} — ABNORMAL LOW (ref >59)

## 2022-07-30 ENCOUNTER — Other Ambulatory Visit: Payer: Self-pay | Admitting: Cardiology

## 2022-07-30 DIAGNOSIS — I10 Essential (primary) hypertension: Secondary | ICD-10-CM

## 2022-07-30 DIAGNOSIS — I2510 Atherosclerotic heart disease of native coronary artery without angina pectoris: Secondary | ICD-10-CM

## 2022-07-30 MED ORDER — AMLODIPINE BESYLATE 10 MG PO TABS: 10.0000 mg | ORAL_TABLET | Freq: Every day | ORAL | 3 refills | Status: DC

## 2022-07-30 MED ORDER — LOSARTAN POTASSIUM 50 MG PO TABS: 50.0000 mg | ORAL_TABLET | Freq: Every day | ORAL | 3 refills | Status: DC

## 2022-07-30 MED ORDER — METOPROLOL SUCCINATE ER 25 MG PO TB24: 25.0000 mg | ORAL_TABLET | Freq: Two times a day (BID) | ORAL | 3 refills | Status: DC

## 2022-07-30 NOTE — Telephone Encounter (Signed)
Blood pressure remains elevated. Will need renal artery duplex at the hospital to evaluate for renal artery stenosis. Order placed.  Thanks MJP

## 2022-07-30 NOTE — Telephone Encounter (Signed)
From patient

## 2022-07-31 DIAGNOSIS — Z09 Encounter for follow-up examination after completed treatment for conditions other than malignant neoplasm: Secondary | ICD-10-CM | POA: Diagnosis not present

## 2022-07-31 DIAGNOSIS — Z86010 Personal history of colonic polyps: Secondary | ICD-10-CM | POA: Diagnosis not present

## 2022-07-31 DIAGNOSIS — K649 Unspecified hemorrhoids: Secondary | ICD-10-CM | POA: Diagnosis not present

## 2022-07-31 DIAGNOSIS — K635 Polyp of colon: Secondary | ICD-10-CM | POA: Diagnosis not present

## 2022-08-07 ENCOUNTER — Other Ambulatory Visit: Payer: Self-pay

## 2022-08-07 ENCOUNTER — Encounter: Payer: Self-pay | Admitting: Cardiology

## 2022-08-07 MED ORDER — LOSARTAN POTASSIUM 100 MG PO TABS: 100.0000 mg | ORAL_TABLET | Freq: Every day | ORAL | 3 refills | Status: DC

## 2022-08-13 ENCOUNTER — Ambulatory Visit (HOSPITAL_COMMUNITY)
Admission: RE | Admit: 2022-08-13 | Discharge: 2022-08-13 | Disposition: A | Discharge status: Home / Self Care | Payer: Federal, State, Local not specified - PPO | Source: Ambulatory Visit | Attending: Cardiology | Admitting: Cardiology

## 2022-08-13 DIAGNOSIS — I10 Essential (primary) hypertension: Secondary | ICD-10-CM

## 2022-08-13 NOTE — Progress Notes (Signed)
Gave patient this info. He acknowledged information, and had no further questions. He did say his BP is still elevated consistently, and he plans on monitoring for the next week and sending you those readings next week.

## 2022-08-14 ENCOUNTER — Inpatient Hospital Stay: Payer: Federal, State, Local not specified - PPO | Attending: Nurse Practitioner

## 2022-08-14 VITALS — BP 136/91 | HR 57 | Temp 97.8°F | Resp 18 | Ht 71.0 in | Wt 202.8 lb

## 2022-08-14 DIAGNOSIS — C7B02 Secondary carcinoid tumors of liver: Secondary | ICD-10-CM | POA: Diagnosis not present

## 2022-08-14 DIAGNOSIS — C7A098 Malignant carcinoid tumors of other sites: Secondary | ICD-10-CM | POA: Insufficient documentation

## 2022-08-14 DIAGNOSIS — C7B04 Secondary carcinoid tumors of peritoneum: Secondary | ICD-10-CM | POA: Diagnosis not present

## 2022-08-14 DIAGNOSIS — D3A098 Benign carcinoid tumors of other sites: Secondary | ICD-10-CM

## 2022-08-14 MED ORDER — LANREOTIDE ACETATE 120 MG/0.5ML ~~LOC~~ SOLN
120.0000 mg | Freq: Once | SUBCUTANEOUS | Status: AC
  Administered 2022-08-14: 120 mg via SUBCUTANEOUS
  Filled 2022-08-14: qty 120

## 2022-08-14 NOTE — Patient Instructions (Signed)
Lanreotide Injection What is this medication? LANREOTIDE (lan REE oh tide) treats high levels of growth hormone (acromegaly). It is used when other therapies have not worked well enough or cannot be tolerated. It works by reducing the amount of growth hormone your body makes. This reduces symptoms and the risk of health problems caused by too much growth hormone, such as diabetes and heart disease. It may also be used to treat neuroendocrine tumors, a cancer of the cells that release hormones and other substances in your body. It works by slowing down the release of these substances from the cells. This slows tumor growth. It also decreases the symptoms of carcinoid syndrome, such as flushing or diarrhea. This medicine may be used for other purposes; ask your health care provider or pharmacist if you have questions. COMMON BRAND NAME(S): Somatuline [place] What should I tell my care team before I take this medication? They need to know if you have any of these conditions: Diabetes Gallbladder disease Heart disease Kidney disease Liver disease Thyroid disease An unusual or allergic reaction to lanreotide, other medications, foods, dyes, or preservatives Pregnant or trying to get pregnant Breast-feeding How should I use this medication? This medication is injected under the skin. It is given by your care team in a hospital or clinic setting. Talk to your care team about the use of this medication in children. Special care may be needed. Overdosage: If you think you have taken too much of this medicine contact a poison control center or emergency room at once. NOTE: This medicine is only for you. Do not share this medicine with others. What if I miss a dose? Keep appointments for follow-up doses. It is important not to miss your dose. Call your care team if you are unable to keep an appointment. What may interact with this medication? Bromocriptine Cyclosporine Certain medications for blood  pressure, heart disease, irregular heartbeat Certain medications for diabetes Quinidine Terfenadine This list may not describe all possible interactions. Give your health care provider a list of all the medicines, herbs, non-prescription drugs, or dietary supplements you use. Also tell them if you smoke, drink alcohol, or use illegal drugs. Some items may interact with your medicine. What should I watch for while using this medication? Visit your care team for regular checks on your progress. Tell your care team if your symptoms do not start to get better or if they get worse. Your condition will be monitored carefully while you are receiving this medication. You may need blood work while you are taking this medication. This medication may increase blood sugar. The risk may be higher in patients who already have diabetes. Ask your care team what you can do to lower your risk of diabetes while taking this medication. Talk to your care team if you wish to become pregnant or think you may be pregnant. This medication can cause serious birth defects. Do not breast-feed while taking this medication and for 6 months after stopping therapy. This medication may cause infertility. Talk to your care team if you are concerned about your fertility. What side effects may I notice from receiving this medication? Side effects that you should report to your care team as soon as possible: Allergic reactions--skin rash, itching, hives, swelling of the face, lips, tongue, or throat Gallbladder problems--severe stomach pain, nausea, vomiting, fever High blood sugar (hyperglycemia)--increased thirst or amount of urine, unusual weakness or fatigue, blurry vision Increase in blood pressure Low blood sugar (hypoglycemia)--tremors or shaking, anxiety, sweating, cold   or clammy skin, confusion, dizziness, rapid heartbeat Low thyroid levels (hypothyroidism)--unusual weakness or fatigue, increased sensitivity to cold,  constipation, hair loss, dry skin, weight gain, feelings of depression Slow heartbeat--dizziness, feeling faint or lightheaded, confusion, trouble breathing, unusual weakness or fatigue Side effects that usually do not require medical attention (report to your care team if they continue or are bothersome): Diarrhea Dizziness Headache Muscle spasms Nausea Pain, redness, irritation, or bruising at the injection site Stomach pain This list may not describe all possible side effects. Call your doctor for medical advice about side effects. You may report side effects to FDA at 1-800-FDA-1088. Where should I keep my medication? This medication is given in a hospital or clinic. It will not be stored at home. NOTE: This sheet is a summary. It may not cover all possible information. If you have questions about this medicine, talk to your doctor, pharmacist, or health care provider.  2023 Elsevier/Gold Standard (2021-07-21 00:00:00)  

## 2022-08-30 ENCOUNTER — Ambulatory Visit (HOSPITAL_COMMUNITY)
Admission: RE | Admit: 2022-08-30 | Discharge: 2022-08-30 | Disposition: A | Discharge status: Home / Self Care | Payer: Federal, State, Local not specified - PPO | Source: Ambulatory Visit | Attending: Oncology | Admitting: Oncology

## 2022-08-30 DIAGNOSIS — C7B02 Secondary carcinoid tumors of liver: Secondary | ICD-10-CM

## 2022-08-30 DIAGNOSIS — C7A8 Other malignant neuroendocrine tumors: Secondary | ICD-10-CM | POA: Diagnosis not present

## 2022-08-30 DIAGNOSIS — C787 Secondary malignant neoplasm of liver and intrahepatic bile duct: Secondary | ICD-10-CM | POA: Diagnosis not present

## 2022-08-30 MED ORDER — GADOBUTROL 1 MMOL/ML IV SOLN
9.0000 mL | Freq: Once | INTRAVENOUS | Status: AC | PRN
  Administered 2022-08-30: 9 mL via INTRAVENOUS

## 2022-09-11 ENCOUNTER — Inpatient Hospital Stay: Payer: Federal, State, Local not specified - PPO | Attending: Nurse Practitioner

## 2022-09-11 VITALS — BP 139/90 | HR 58 | Temp 97.2°F | Resp 18 | Ht 71.0 in | Wt 203.2 lb

## 2022-09-11 DIAGNOSIS — C7A098 Malignant carcinoid tumors of other sites: Secondary | ICD-10-CM | POA: Diagnosis not present

## 2022-09-11 DIAGNOSIS — C7B02 Secondary carcinoid tumors of liver: Secondary | ICD-10-CM | POA: Insufficient documentation

## 2022-09-11 DIAGNOSIS — C7B04 Secondary carcinoid tumors of peritoneum: Secondary | ICD-10-CM | POA: Diagnosis not present

## 2022-09-11 DIAGNOSIS — D3A098 Benign carcinoid tumors of other sites: Secondary | ICD-10-CM

## 2022-09-11 MED ORDER — LANREOTIDE ACETATE 120 MG/0.5ML ~~LOC~~ SOLN
120.0000 mg | Freq: Once | SUBCUTANEOUS | Status: AC
  Administered 2022-09-11: 120 mg via SUBCUTANEOUS
  Filled 2022-09-11: qty 120

## 2022-09-11 NOTE — Patient Instructions (Signed)
Lanreotide Injection What is this medication? LANREOTIDE (lan REE oh tide) treats high levels of growth hormone (acromegaly). It is used when other therapies have not worked well enough or cannot be tolerated. It works by reducing the amount of growth hormone your body makes. This reduces symptoms and the risk of health problems caused by too much growth hormone, such as diabetes and heart disease. It may also be used to treat neuroendocrine tumors, a cancer of the cells that release hormones and other substances in your body. It works by slowing down the release of these substances from the cells. This slows tumor growth. It also decreases the symptoms of carcinoid syndrome, such as flushing or diarrhea. This medicine may be used for other purposes; ask your health care provider or pharmacist if you have questions. COMMON BRAND NAME(S): Somatuline [place] What should I tell my care team before I take this medication? They need to know if you have any of these conditions: Diabetes Gallbladder disease Heart disease Kidney disease Liver disease Thyroid disease An unusual or allergic reaction to lanreotide, other medications, foods, dyes, or preservatives Pregnant or trying to get pregnant Breast-feeding How should I use this medication? This medication is injected under the skin. It is given by your care team in a hospital or clinic setting. Talk to your care team about the use of this medication in children. Special care may be needed. Overdosage: If you think you have taken too much of this medicine contact a poison control center or emergency room at once. NOTE: This medicine is only for you. Do not share this medicine with others. What if I miss a dose? Keep appointments for follow-up doses. It is important not to miss your dose. Call your care team if you are unable to keep an appointment. What may interact with this medication? Bromocriptine Cyclosporine Certain medications for blood  pressure, heart disease, irregular heartbeat Certain medications for diabetes Quinidine Terfenadine This list may not describe all possible interactions. Give your health care provider a list of all the medicines, herbs, non-prescription drugs, or dietary supplements you use. Also tell them if you smoke, drink alcohol, or use illegal drugs. Some items may interact with your medicine. What should I watch for while using this medication? Visit your care team for regular checks on your progress. Tell your care team if your symptoms do not start to get better or if they get worse. Your condition will be monitored carefully while you are receiving this medication. You may need blood work while you are taking this medication. This medication may increase blood sugar. The risk may be higher in patients who already have diabetes. Ask your care team what you can do to lower your risk of diabetes while taking this medication. Talk to your care team if you wish to become pregnant or think you may be pregnant. This medication can cause serious birth defects. Do not breast-feed while taking this medication and for 6 months after stopping therapy. This medication may cause infertility. Talk to your care team if you are concerned about your fertility. What side effects may I notice from receiving this medication? Side effects that you should report to your care team as soon as possible: Allergic reactions--skin rash, itching, hives, swelling of the face, lips, tongue, or throat Gallbladder problems--severe stomach pain, nausea, vomiting, fever High blood sugar (hyperglycemia)--increased thirst or amount of urine, unusual weakness or fatigue, blurry vision Increase in blood pressure Low blood sugar (hypoglycemia)--tremors or shaking, anxiety, sweating, cold   or clammy skin, confusion, dizziness, rapid heartbeat Low thyroid levels (hypothyroidism)--unusual weakness or fatigue, increased sensitivity to cold,  constipation, hair loss, dry skin, weight gain, feelings of depression Slow heartbeat--dizziness, feeling faint or lightheaded, confusion, trouble breathing, unusual weakness or fatigue Side effects that usually do not require medical attention (report to your care team if they continue or are bothersome): Diarrhea Dizziness Headache Muscle spasms Nausea Pain, redness, irritation, or bruising at the injection site Stomach pain This list may not describe all possible side effects. Call your doctor for medical advice about side effects. You may report side effects to FDA at 1-800-FDA-1088. Where should I keep my medication? This medication is given in a hospital or clinic. It will not be stored at home. NOTE: This sheet is a summary. It may not cover all possible information. If you have questions about this medicine, talk to your doctor, pharmacist, or health care provider.  2023 Elsevier/Gold Standard (2021-07-21 00:00:00)  

## 2022-09-14 ENCOUNTER — Other Ambulatory Visit: Payer: Self-pay | Admitting: Cardiology

## 2022-09-21 ENCOUNTER — Ambulatory Visit: Payer: Federal, State, Local not specified - PPO | Admitting: Cardiology

## 2022-09-24 ENCOUNTER — Ambulatory Visit: Payer: Federal, State, Local not specified - PPO | Admitting: Cardiology

## 2022-09-24 ENCOUNTER — Encounter: Payer: Self-pay | Admitting: Cardiology

## 2022-09-24 VITALS — BP 136/99 | HR 65 | Resp 16 | Ht 71.0 in | Wt 206.0 lb

## 2022-09-24 DIAGNOSIS — I2510 Atherosclerotic heart disease of native coronary artery without angina pectoris: Secondary | ICD-10-CM | POA: Diagnosis not present

## 2022-09-24 DIAGNOSIS — I10 Essential (primary) hypertension: Secondary | ICD-10-CM

## 2022-09-24 MED ORDER — LABETALOL HCL 200 MG PO TABS: 200.0000 mg | ORAL_TABLET | Freq: Two times a day (BID) | ORAL | 3 refills | Status: DC

## 2022-09-24 NOTE — Progress Notes (Signed)
Subjective:   William Hancock, male    DOB: June 12, 1965, 57 y.o.   MRN: [id number]   Chief complaint:  Coronary artery disease   HPI  57 year old Caucasian male with hypertension, hyperlipidemia, coronary artery disease, s/p multivessel complex PCI (pro & mid RCA, prox-mid LAD/Diag bifurcation) for NSTEMI in 06/2018, metastatic neuroendocrine tumor, now s/p potentially curative surgery  Patient denies any symptoms.  He is staying active with regular physical activity and gym workouts, but blood pressure is elevated. Reviewed recent test results with the patient, details below.      Current Outpatient Medications:    amLODipine (NORVASC) 10 MG tablet, Take 1 tablet (10 mg total) by mouth daily., Disp: 90 tablet, Rfl: 3   aspirin (ASPIRIN LOW DOSE) 81 MG chewable tablet, CHEW ONE TABLET BY MOUTH DAILY, Disp: 90 tablet, Rfl: 1   atorvastatin (LIPITOR) 40 MG tablet, Take 1 tablet (40 mg total) by mouth daily., Disp: 90 tablet, Rfl: 3   cyanocobalamin (VITAMIN B12) 1000 MCG tablet, Take 1,000 mcg by mouth in the morning. sublinguinal, Disp: , Rfl:    levothyroxine (SYNTHROID) 125 MCG tablet, Take 125 mcg by mouth daily before breakfast., Disp: , Rfl:    losartan (COZAAR) 100 MG tablet, Take 1 tablet (100 mg total) by mouth daily., Disp: 90 tablet, Rfl: 3   metoprolol succinate (TOPROL-XL) 25 MG 24 hr tablet, Take 1 tablet (25 mg total) by mouth in the morning and at bedtime., Disp: 180 tablet, Rfl: 3   nitroGLYCERIN (NITROSTAT) 0.4 MG SL tablet, Place 1 tablet (0.4 mg total) under the tongue every 5 (five) minutes x 3 doses as needed for chest pain., Disp: 25 tablet, Rfl: 3   Zinc 30 MG TABS, Take by mouth in the morning., Disp: , Rfl:   Cardiovascular studies:  EKG 05/17/2022: Sinus rhythm 58 bpm First degree A-V block  Nonspecific T-abnormality  Renal artery duplex 08/13/2022:   Right: No evidence of right renal artery stenosis. Normal right         Resisitive Index. Normal size  right kidney. RRV flow present.  Left:  No evidence of left renal artery stenosis. Normal left         Resistive Index. Normal size of left kidney. LRV flow         present.  Mesenteric:  Normal Celiac artery and Superior Mesenteric artery findings.     Echocardiogram 05/30/2021: Normal LV systolic function with visual EF 60-65%. Left ventricle cavity is normal in size. Normal left ventricular wall thickness. Normal global wall motion. Normal diastolic filling pattern, normal LAP. Calculated EF 59%. Mild tricuspid regurgitation. No evidence of pulmonary hypertension. Compared to study 06/06/2018 no significant change.  Coronary intervention 06/09/2018: LM: Normal  LAD: Prox to mid LAD/Diag1 bifurcation severe calcific 80% stenosis        Diag 1 mid 75% stenosis        Atherectomy and prox LAD into Diag         Synergy DES 2.75 X 16 mm DES        Overlapping Stent into prox LAD Synergy DES 3.0 X 38 mm        Provisional stent mid LAD with minicrush technique Synergy DES 2.75 X 16 mm DES Ramus: 50 % proximal disease LCx: Mild luminal irregularities RCA: Stenting performed 06/06/2018        Severe mid 99% stenosis--->Orsero 3.5 X 26 mm Orsero DES         Post dilatation with 4.0 mm  [place] balloon        Severe prox 80% stenosis--->Orsero 3.5 X 9 mm DES        Post dilatation with 4.0 mm [place] balloon   Recommendation: DAPT with Aspirin and brilinta for 1 year Aggressive risk factor modification  Hospital echocardiogram 06/06/2018: - Left ventricle: The cavity size was normal. There was mild   concentric hypertrophy. Systolic function was normal. The   estimated ejection fraction was in the range of 55% to 60%. Wall   motion was normal; there were no regional wall motion   abnormalities. Doppler parameters are consistent with abnormal   left ventricular relaxation (grade 1 diastolic dysfunction). - No significant valvular abnormality.  Recent labs: 05/15/2022: Glucose 112, BUN/Cr  18/1.17. EGFR >60. Na/K 140/3.8. AlKP 157. Rest of the CMP normal H/H 13/39. MCV 88. Platelets 230  11/03/2021: Chol 106, TG 88, HDL 42, LDL 47  03/21/2021: Glucose 101, BUN/Cr 22/1.39. EGFR 59. Na/K 140/4.8. T.bili 1.3. Rest of the CMP normal H/H 14/41. MCV 90. Platelets 186  Review of Systems  Review of Systems  Cardiovascular:  Negative for chest pain, dyspnea on exertion, leg swelling, palpitations and syncope.         Vitals:   09/24/22 1427  BP: (!) 136/99  Pulse: 65  Resp: 16  SpO2: 98%     Objective:   Physical Exam  Physical Exam Vitals and nursing note reviewed.  Constitutional:      General: He is not in acute distress.    Appearance: He is well-developed.  Neck:     Vascular: No JVD.  Cardiovascular:     Rate and Rhythm: Normal rate and regular rhythm.     Pulses: Intact distal pulses.     Heart sounds: Normal heart sounds. No murmur heard. Pulmonary:     Effort: Pulmonary effort is normal.     Breath sounds: Normal breath sounds. No wheezing or rales.  Musculoskeletal:     Right lower leg: No edema.     Left lower leg: No edema.            Assessment & Recommendations:   57 y/o Caucasian male with hypertension, hyperlipidemia, coronary artery disease, s/p multivessel complex PCI (pro & mid RCA, prox-mid LAD/Diag bifurcation) for NSTEMI in 06/2018, metastatic neuroendocrine tumor, now s/p potentially curative surgery  CAD without angina: S/p multilvessel PCI for NSTEMI 06/2018 Doing well without angina symptoms Continue aspirin 81 mg daily.   Continue amlodipine 10 mg daily. Continue lipitor 40 mg daily.  Chol 106, TG 88, HDL 42, LDL 47 (11/2021).  Hypertension: Uncontrolled. Change metoprolol succinate 25 mg twice daily, to labetalol 200 mg twice daily. Continue amlodipine 10 mg daily, losartan 100 mg daily. Recommend reducing caffeine intake. Patient will send my MyChart message in 2 weeks. If SBP consisently >140 mmHg, will consider  adding hydralazine.    Metastatic neuroendocrine tumor: Now s/p surgery. Continue f/u w/Dr. Truett Perna.   F/u in 3 months    Elder Negus, MD Pager: [phone] Office: [phone]

## 2022-10-05 ENCOUNTER — Encounter: Payer: Self-pay | Admitting: Oncology

## 2022-10-09 ENCOUNTER — Inpatient Hospital Stay: Payer: Federal, State, Local not specified - PPO

## 2022-10-09 ENCOUNTER — Inpatient Hospital Stay: Payer: Federal, State, Local not specified - PPO | Attending: Nurse Practitioner

## 2022-10-09 ENCOUNTER — Inpatient Hospital Stay: Payer: Federal, State, Local not specified - PPO | Admitting: Oncology

## 2022-10-09 VITALS — BP 131/85 | HR 60 | Temp 97.9°F | Resp 18 | Ht 71.0 in | Wt 201.9 lb

## 2022-10-09 DIAGNOSIS — C7B04 Secondary carcinoid tumors of peritoneum: Secondary | ICD-10-CM | POA: Insufficient documentation

## 2022-10-09 DIAGNOSIS — I252 Old myocardial infarction: Secondary | ICD-10-CM | POA: Diagnosis not present

## 2022-10-09 DIAGNOSIS — C7B02 Secondary carcinoid tumors of liver: Secondary | ICD-10-CM | POA: Diagnosis not present

## 2022-10-09 DIAGNOSIS — C7A098 Malignant carcinoid tumors of other sites: Secondary | ICD-10-CM | POA: Insufficient documentation

## 2022-10-09 DIAGNOSIS — I2510 Atherosclerotic heart disease of native coronary artery without angina pectoris: Secondary | ICD-10-CM | POA: Diagnosis not present

## 2022-10-09 DIAGNOSIS — E039 Hypothyroidism, unspecified: Secondary | ICD-10-CM | POA: Insufficient documentation

## 2022-10-09 DIAGNOSIS — D3A098 Benign carcinoid tumors of other sites: Secondary | ICD-10-CM

## 2022-10-09 DIAGNOSIS — I10 Essential (primary) hypertension: Secondary | ICD-10-CM | POA: Insufficient documentation

## 2022-10-09 LAB — CMP (CANCER CENTER ONLY)
ALT: 33 U/L (ref 0–44)
AST: 34 U/L (ref 15–41)
Albumin: 4.7 g/dL (ref 3.5–5.0)
Alkaline Phosphatase: 113 U/L (ref 38–126)
Anion gap: 8 (ref 5–15)
BUN: 21 mg/dL — ABNORMAL HIGH (ref 6–20)
CO2: 24 mmol/L (ref 22–32)
Calcium: 9.1 mg/dL (ref 8.9–10.3)
Chloride: 108 mmol/L (ref 98–111)
Creatinine: 1.51 mg/dL — ABNORMAL HIGH (ref 0.61–1.24)
GFR, Estimated: 53 mL/min — ABNORMAL LOW (ref >60)
Glucose, Bld: 100 mg/dL — ABNORMAL HIGH (ref 70–99)
Potassium: 4 mmol/L (ref 3.5–5.1)
Sodium: 140 mmol/L (ref 135–145)
Total Bilirubin: 1.8 mg/dL — ABNORMAL HIGH (ref 0.3–1.2)
Total Protein: 7.6 g/dL (ref 6.5–8.1)

## 2022-10-09 NOTE — Progress Notes (Signed)
[facility] OFFICE PROGRESS NOTE   Diagnosis: Carcinoid tumor  INTERVAL HISTORY:   Mr. Mcdonnell returns as scheduled.  He continues monthly lanreotide.  No flushing or diarrhea.  No complaint.  Objective:  Vital signs in last 24 hours:  Blood pressure 131/85, pulse 60, temperature 97.9 F (36.6 C), temperature source Oral, resp. rate 18, height 5\' 11"  (1.803 m), weight 201 lb 14.4 oz (91.6 kg), SpO2 100 %.    Resp: Lungs clear bilaterally Cardio: Regular rate and rhythm GI: Nontender, no splenomegaly, no apparent ascites, liver edge is palpable a few fingers below the medial right costal margin Vascular: No leg edema   Lab Results:  Lab Results  Component Value Date   WBC 10.3 05/15/2022   HGB 13.7 05/15/2022   HCT 39.7 05/15/2022   MCV 88.8 05/15/2022   PLT 230 05/15/2022   NEUTROABS 8.3 (H) 05/15/2022    CMP  Lab Results  Component Value Date   NA 142 07/25/2022   K 4.8 07/25/2022   CL 104 07/25/2022   CO2 22 07/25/2022   GLUCOSE 99 07/25/2022   BUN 14 07/25/2022   CREATININE 1.45 (H) 07/25/2022   CALCIUM 9.7 07/25/2022   PROT 7.7 05/15/2022   ALBUMIN 4.8 05/15/2022   AST 17 05/15/2022   ALT 20 05/15/2022   ALKPHOS 157 (H) 05/15/2022   BILITOT 1.5 (H) 05/15/2022   GFRNONAA >60 05/15/2022   GFRAA >60 02/26/2020    Medications: I have reviewed the patient's current medications.   Assessment/Plan: Metastatic neuroendocrine tumor, well-differentiated 10/31/2018 Abdominal ultrasound-numerous nearly confluent heterogeneous masses measuring up to 3.5 cm.   11/04/2018 CT abdomen/pelvis-multiple enhancing hepatic lesions and a central partially calcified spiculated mesenteric mass measuring 3.1 x 3.1 cm.   11/13/2018 biopsy liver lesion-metastatic well-differentiated neuroendocrine tumor positive for CKAE1-AE3, synaptophysin, chromogranin, CD56 and CDX-2; Ki-67 labeling index less than 3%; TTF-1 negative Netspot 12/12/2018- increased activity  associated a lobular mesenteric mass, small bowel medial to the mesenteric mass, and multiple liver lesions Elevated chromogranin A level and 24-hour urine 5 HIAA Monthly lanreotide beginning 12/23/2018 CT 05/13/2019-multiple large hepatic metastases on the dotatate are poorly visualized, several small lesions are decreased in size 1 new lesion, stable mesenteric mass Netspot 09/04/2019-increase in size of left and right liver lesions, stable to slightly decreased radiotracer accumulation, stable central mesenteric mass, no new metastatic lesions Lutathera 10/13/2019 Lutathera 12/04/2019 Lutathera 01/29/2020 Lutathera 03/25/2020 Restaging Netspot 04/22/2020-overall positive measurable response to therapy.  Multiple hepatic metastasis continue to demonstrate intense radiotracer activity, the activity is consistently decreased from preradiotherapy scan.  No evidence of progressive disease in the liver.  Central mesenteric metastasis and adjacent small bowel lesion have very similar metabolic activity and no change in size.  No evidence of new peritoneal metastasis or skeletal metastasis. Monthly lanreotide continued Netspot 03/02/2021-stable radiotracer avid confluent left and right liver lesions, stable central mesenteric nodal metastasis, no new sites of metastatic disease Monthly lanreotide continued MRI abdomen 05/22/2021-innumerable confluent contrast-enhancing liver lesions-not significantly changed compared to the PET/CT, stable mesenteric metastasis 07/19/2021-bland embolization of right hepatic metastases with micro particles in [place] 08/16/2021-MRI abdomen without contrast-innumerable hepatic masses, decreased in size and overall bulk, especially in the right hepatic segments 5-8.  Stable central mesenteric mass 11/20/2021-dotatate PET-confluent radiotracer uptake at the right hepatic dome, elongated area of radiotracer uptake at the anterior superior left hepatic lobe, small focus of uptake at the  caudate lobe, radiotracer uptake associated with a partially calcified mesenteric mass and adjacent small bowel loop,  no new sites of metastatic disease 11/21/2021-bland bleed/particle embolization of the left hepatic artery 01/24/2022-small bowel resection with anastomosis, cholecystectomy and hepatic metastasectomy (gallbladder with chronic cholecystitis with cholelithiasis; mesenteric lymph nodes with 2 of 22 lymph nodes positive for metastatic well differentiated neuroendocrine tumor; small bowel resection with well-differentiated neuroendocrine tumor, 2 cm, WHO grade 2, Ki-67 index approximately 3%, tumor extends from the mucosa to the serosa, mesenteric tumor deposit with necrosis and calcifications, 6 cm in greatest diameter (Ki-67 index approximately 8%), lymphovascular invasion present, perineural invasion present, margins negative for tumor; additional segment small bowel resection-benign small bowel with normal villous architecture and no significant histopathologic change, negative for tumor; liver segment 2 resection with metastatic well differentiated neuroendocrine tumor with extensive necrosis, 2.5 cm in greatest diameter; liver additional segment 2 resection metastatic well-differentiated neuroendocrine tumor with extensive necrosis, 5.1 cm in greatest diameter ERCP 02/02/2022, stent placed (report could not be located in care everywhere) 02/02/2022 exploratory laparotomy with small bowel resection-gangrenous necrosis with acute chronic inflammation; acute serositis with adhesions 02/04/2022 exploratory laparotomy with small bowel resection, appendectomy and small bowel-small bowel anastomosis-benign appendix with periappendiceal subacute and chronic hemorrhagic serosal reaction, no evidence of carcinoid/neuroendocrine tumor Decreased chromogranin A 03/21/2022 MRI abdomen 05/23/2022-overall decrease in size of innumerable bilobar hepatic metastases, no new hepatic lesions, postsurgical changes with  interval resection of the central mesenteric mass Dotatate PET 06/11/2022-decrease in number of radiotracer avid metastases in the liver, remaining metastatic lesions in the right liver, interval resection of central mesenteric mass with partial small bowel resection, no mesenteric or small bowel lesions present MRI abdomen 08/30/2022-innumerable by lobar hepatic metastases, mildly improved from 05/23/2022 MRI History of MI/stent placement CAD Hypertension Renal insufficiency Hypothyroidism diagnosed with markedly elevated TSH and low T4 on 12/18/2018 Acute left elbow/right ankle pain and swelling August 2021 February 2023-acute renal failure following liver embolization procedure, improved        Disposition: Mr. Graffeo appears stable.  We will follow-up on the chromogranin A level from today.  He is scheduled for a dotatate PET in [place] on 10/18/2022.  Lanreotide will be held today.  He will return for an office visit and lanreotide on 11/08/2022.  Thornton Papas, MD  10/09/2022  8:37 AM

## 2022-10-11 LAB — CHROMOGRANIN A: Chromogranin A (ng/mL): 303.3 ng/mL — ABNORMAL HIGH (ref 0.0–101.8)

## 2022-10-16 ENCOUNTER — Encounter: Payer: Self-pay | Admitting: Oncology

## 2022-11-08 ENCOUNTER — Inpatient Hospital Stay: Payer: Federal, State, Local not specified - PPO

## 2022-11-08 ENCOUNTER — Inpatient Hospital Stay: Payer: Federal, State, Local not specified - PPO | Attending: Nurse Practitioner | Admitting: Oncology

## 2022-11-08 ENCOUNTER — Encounter: Payer: Self-pay | Admitting: *Deleted

## 2022-11-08 VITALS — BP 136/92 | HR 60 | Temp 98.1°F | Resp 18 | Ht 71.0 in | Wt 200.0 lb

## 2022-11-08 DIAGNOSIS — C7B04 Secondary carcinoid tumors of peritoneum: Secondary | ICD-10-CM | POA: Insufficient documentation

## 2022-11-08 DIAGNOSIS — C7A098 Malignant carcinoid tumors of other sites: Secondary | ICD-10-CM | POA: Insufficient documentation

## 2022-11-08 DIAGNOSIS — C7B02 Secondary carcinoid tumors of liver: Secondary | ICD-10-CM | POA: Insufficient documentation

## 2022-11-08 DIAGNOSIS — I252 Old myocardial infarction: Secondary | ICD-10-CM | POA: Insufficient documentation

## 2022-11-08 DIAGNOSIS — D3A098 Benign carcinoid tumors of other sites: Secondary | ICD-10-CM | POA: Diagnosis not present

## 2022-11-08 DIAGNOSIS — I2510 Atherosclerotic heart disease of native coronary artery without angina pectoris: Secondary | ICD-10-CM | POA: Insufficient documentation

## 2022-11-08 DIAGNOSIS — E039 Hypothyroidism, unspecified: Secondary | ICD-10-CM | POA: Insufficient documentation

## 2022-11-08 DIAGNOSIS — I10 Essential (primary) hypertension: Secondary | ICD-10-CM | POA: Diagnosis not present

## 2022-11-08 MED ORDER — LANREOTIDE ACETATE 120 MG/0.5ML ~~LOC~~ SOLN
120.0000 mg | Freq: Once | SUBCUTANEOUS | Status: AC
  Administered 2022-11-08: 120 mg via SUBCUTANEOUS
  Filled 2022-11-08: qty 120

## 2022-11-08 NOTE — Progress Notes (Signed)
[facility] OFFICE PROGRESS NOTE   Diagnosis: Carcinoid tumor  INTERVAL HISTORY:   Mr. Ohler is monthly lanreotide.  No flushing or consistent diarrhea.  He generally feels well.  He underwent a restaging dotatate PET [place] on 10/18/2022.  There is new tracer uptake associated with a small left paratracheal node.  New focus of uptake in the posterior right hepatic lobe with resolution of previously noted radiotracer avid mesenteric nodular focus. He saw Dr.Maluccio  on 10/18/2022.  She recommends continue lanreotide with a 79-month follow-up MRI.   Objective:  Vital signs in last 24 hours:  Blood pressure (!) 136/92, pulse 60, temperature 98.1 F (36.7 C), temperature source Oral, resp. rate 18, height 5\' 11"  (1.803 m), weight 200 lb (90.7 kg), SpO2 100 %.    Resp: Lungs clear bilaterally Cardio: Regular rate and rhythm GI: No hepatosplenomegaly, nontender Vascular: No leg edema    Portacath/PICC-without erythema  Lab Results:  Lab Results  Component Value Date   WBC 10.3 05/15/2022   HGB 13.7 05/15/2022   HCT 39.7 05/15/2022   MCV 88.8 05/15/2022   PLT 230 05/15/2022   NEUTROABS 8.3 (H) 05/15/2022    CMP  Lab Results  Component Value Date   NA 140 10/09/2022   K 4.0 10/09/2022   CL 108 10/09/2022   CO2 24 10/09/2022   GLUCOSE 100 (H) 10/09/2022   BUN 21 (H) 10/09/2022   CREATININE 1.51 (H) 10/09/2022   CALCIUM 9.1 10/09/2022   PROT 7.6 10/09/2022   ALBUMIN 4.7 10/09/2022   AST 34 10/09/2022   ALT 33 10/09/2022   ALKPHOS 113 10/09/2022   BILITOT 1.8 (H) 10/09/2022   GFRNONAA 53 (L) 10/09/2022   GFRAA >60 02/26/2020  Creatinine 1.56 on 10/18/2022   Medications: I have reviewed the patient's current medications.   Assessment/Plan: Metastatic neuroendocrine tumor, well-differentiated 10/31/2018 Abdominal ultrasound-numerous nearly confluent heterogeneous masses measuring up to 3.5 cm.   11/04/2018 CT abdomen/pelvis-multiple enhancing  hepatic lesions and a central partially calcified spiculated mesenteric mass measuring 3.1 x 3.1 cm.   11/13/2018 biopsy liver lesion-metastatic well-differentiated neuroendocrine tumor positive for CKAE1-AE3, synaptophysin, chromogranin, CD56 and CDX-2; Ki-67 labeling index less than 3%; TTF-1 negative Netspot 12/12/2018- increased activity associated a lobular mesenteric mass, small bowel medial to the mesenteric mass, and multiple liver lesions Elevated chromogranin A level and 24-hour urine 5 HIAA Monthly lanreotide beginning 12/23/2018 CT 05/13/2019-multiple large hepatic metastases on the dotatate are poorly visualized, several small lesions are decreased in size 1 new lesion, stable mesenteric mass Netspot 09/04/2019-increase in size of left and right liver lesions, stable to slightly decreased radiotracer accumulation, stable central mesenteric mass, no new metastatic lesions Lutathera 10/13/2019 Lutathera 12/04/2019 Lutathera 01/29/2020 Lutathera 03/25/2020 Restaging Netspot 04/22/2020-overall positive measurable response to therapy.  Multiple hepatic metastasis continue to demonstrate intense radiotracer activity, the activity is consistently decreased from preradiotherapy scan.  No evidence of progressive disease in the liver.  Central mesenteric metastasis and adjacent small bowel lesion have very similar metabolic activity and no change in size.  No evidence of new peritoneal metastasis or skeletal metastasis. Monthly lanreotide continued Netspot 03/02/2021-stable radiotracer avid confluent left and right liver lesions, stable central mesenteric nodal metastasis, no new sites of metastatic disease Monthly lanreotide continued MRI abdomen 05/22/2021-innumerable confluent contrast-enhancing liver lesions-not significantly changed compared to the PET/CT, stable mesenteric metastasis 07/19/2021-bland embolization of right hepatic metastases with micro particles in [place] 08/16/2021-MRI abdomen  without contrast-innumerable hepatic masses, decreased in size and overall bulk, especially in the  right hepatic segments 5-8.  Stable central mesenteric mass 11/20/2021-dotatate PET-confluent radiotracer uptake at the right hepatic dome, elongated area of radiotracer uptake at the anterior superior left hepatic lobe, small focus of uptake at the caudate lobe, radiotracer uptake associated with a partially calcified mesenteric mass and adjacent small bowel loop, no new sites of metastatic disease 11/21/2021-bland bleed/particle embolization of the left hepatic artery 01/24/2022-small bowel resection with anastomosis, cholecystectomy and hepatic metastasectomy (gallbladder with chronic cholecystitis with cholelithiasis; mesenteric lymph nodes with 2 of 22 lymph nodes positive for metastatic well differentiated neuroendocrine tumor; small bowel resection with well-differentiated neuroendocrine tumor, 2 cm, WHO grade 2, Ki-67 index approximately 3%, tumor extends from the mucosa to the serosa, mesenteric tumor deposit with necrosis and calcifications, 6 cm in greatest diameter (Ki-67 index approximately 8%), lymphovascular invasion present, perineural invasion present, margins negative for tumor; additional segment small bowel resection-benign small bowel with normal villous architecture and no significant histopathologic change, negative for tumor; liver segment 2 resection with metastatic well differentiated neuroendocrine tumor with extensive necrosis, 2.5 cm in greatest diameter; liver additional segment 2 resection metastatic well-differentiated neuroendocrine tumor with extensive necrosis, 5.1 cm in greatest diameter ERCP 02/02/2022, stent placed (report could not be located in care everywhere) 02/02/2022 exploratory laparotomy with small bowel resection-gangrenous necrosis with acute chronic inflammation; acute serositis with adhesions 02/04/2022 exploratory laparotomy with small bowel resection, appendectomy and  small bowel-small bowel anastomosis-benign appendix with periappendiceal subacute and chronic hemorrhagic serosal reaction, no evidence of carcinoid/neuroendocrine tumor Decreased chromogranin A 03/21/2022 MRI abdomen 05/23/2022-overall decrease in size of innumerable bilobar hepatic metastases, no new hepatic lesions, postsurgical changes with interval resection of the central mesenteric mass Dotatate PET 06/11/2022-decrease in number of radiotracer avid metastases in the liver, remaining metastatic lesions in the right liver, interval resection of central mesenteric mass with partial small bowel resection, no mesenteric or small bowel lesions present MRI abdomen 08/30/2022-innumerable by lobar hepatic metastases, mildly improved from 05/23/2022 MRI Dotatate PET 10/18/2022-compared to 11/20/2021, similar appearance of right hepatic lobe and caudate avid metastatic disease with exception of a new focus of the posterior right hepatic lobe, new SSR positive nonenlarged left paratracheal node Lanreotide continued History of MI/stent placement CAD Hypertension Renal insufficiency Hypothyroidism diagnosed with markedly elevated TSH and low T4 on 12/18/2018 Acute left elbow/right ankle pain and swelling August 2021 February 2023-acute renal failure following liver embolization procedure, improved        Disposition: William Hancock appears stable.  He has metastatic carcinoid tumor.  The restaging Netspot reveals overall stable disease with a new focus of activity involving a small mediastinal lymph node.  The plan is to continue monthly lanreotide.  He will be scheduled for a restaging MRI in 3 months.  We will consider a chest CT in 4-6 months to follow-up on the mediastinal lymph node.  He will return for an office visit after the restaging MRI in 3 months.  Thornton Papas, MD  11/08/2022  8:34 AM

## 2022-11-16 ENCOUNTER — Ambulatory Visit: Payer: Federal, State, Local not specified - PPO | Admitting: Cardiology

## 2022-11-22 DIAGNOSIS — D225 Melanocytic nevi of trunk: Secondary | ICD-10-CM | POA: Diagnosis not present

## 2022-11-22 DIAGNOSIS — L570 Actinic keratosis: Secondary | ICD-10-CM | POA: Diagnosis not present

## 2022-11-22 DIAGNOSIS — L814 Other melanin hyperpigmentation: Secondary | ICD-10-CM | POA: Diagnosis not present

## 2022-11-22 DIAGNOSIS — L218 Other seborrheic dermatitis: Secondary | ICD-10-CM | POA: Diagnosis not present

## 2022-11-22 DIAGNOSIS — L821 Other seborrheic keratosis: Secondary | ICD-10-CM | POA: Diagnosis not present

## 2022-12-05 ENCOUNTER — Inpatient Hospital Stay: Payer: Federal, State, Local not specified - PPO | Attending: Nurse Practitioner

## 2022-12-05 VITALS — BP 124/82 | HR 63 | Temp 98.4°F | Resp 18 | Ht 71.0 in | Wt 201.4 lb

## 2022-12-05 DIAGNOSIS — C7B04 Secondary carcinoid tumors of peritoneum: Secondary | ICD-10-CM | POA: Insufficient documentation

## 2022-12-05 DIAGNOSIS — C7B02 Secondary carcinoid tumors of liver: Secondary | ICD-10-CM | POA: Diagnosis not present

## 2022-12-05 DIAGNOSIS — C7A098 Malignant carcinoid tumors of other sites: Secondary | ICD-10-CM | POA: Diagnosis not present

## 2022-12-05 DIAGNOSIS — D3A098 Benign carcinoid tumors of other sites: Secondary | ICD-10-CM

## 2022-12-05 MED ORDER — LANREOTIDE ACETATE 120 MG/0.5ML ~~LOC~~ SOLN
120.0000 mg | Freq: Once | SUBCUTANEOUS | Status: AC
  Administered 2022-12-05: 120 mg via SUBCUTANEOUS

## 2022-12-05 NOTE — Patient Instructions (Signed)
[facility]   Discharge Instructions: Thank you for choosing [facility] to provide your oncology and hematology care.   If you have a lab appointment with the Cancer Center, please go directly to the Cancer Center and check in at the registration area.   Wear comfortable clothing and clothing appropriate for easy access to any Portacath or PICC line.   We strive to give you quality time with your provider. You may need to reschedule your appointment if you arrive late (15 or more minutes).  Arriving late affects you and other patients whose appointments are after yours.  Also, if you miss three or more appointments without notifying the office, you may be dismissed from the clinic at the provider's discretion.      For prescription refill requests, have your pharmacy contact our office and allow 72 hours for refills to be completed.    Today you received the following chemotherapy and/or immunotherapy agents Lanreotide.      To help prevent nausea and vomiting after your treatment, we encourage you to take your nausea medication as directed.  BELOW ARE SYMPTOMS THAT SHOULD BE REPORTED IMMEDIATELY: *FEVER GREATER THAN 100.4 F (38 C) OR HIGHER *CHILLS OR SWEATING *NAUSEA AND VOMITING THAT IS NOT CONTROLLED WITH YOUR NAUSEA MEDICATION *UNUSUAL SHORTNESS OF BREATH *UNUSUAL BRUISING OR BLEEDING *URINARY PROBLEMS (pain or burning when urinating, or frequent urination) *BOWEL PROBLEMS (unusual diarrhea, constipation, pain near the anus) TENDERNESS IN MOUTH AND THROAT WITH OR WITHOUT PRESENCE OF ULCERS (sore throat, sores in mouth, or a toothache) UNUSUAL RASH, SWELLING OR PAIN  UNUSUAL VAGINAL DISCHARGE OR ITCHING   Items with * indicate a potential emergency and should be followed up as soon as possible or go to the Emergency Department if any problems should occur.  Please show the CHEMOTHERAPY ALERT CARD or IMMUNOTHERAPY ALERT CARD at  check-in to the Emergency Department and triage nurse.  Should you have questions after your visit or need to cancel or reschedule your appointment, please contact [facility]  Dept: [phone]  and follow the prompts.  Office hours are 8:00 a.m. to 4:30 p.m. Monday - Friday. Please note that voicemails left after 4:00 p.m. may not be returned until the following business day.  We are closed weekends and major holidays. You have access to a nurse at all times for urgent questions. Please call the main number to the clinic Dept: [phone] and follow the prompts.   For any non-urgent questions, you may also contact your provider using MyChart. We now offer e-Visits for anyone 18 and older to request care online for non-urgent symptoms. For details visit [email or web address].   Also download the MyChart app! Go to the app store, search "MyChart", open the app, select [facility], and log in with your MyChart username and password.  Lanreotide Injection What is this medication? LANREOTIDE (lan REE oh tide) treats high levels of growth hormone (acromegaly). It is used when other therapies have not worked well enough or cannot be tolerated. It works by reducing the amount of growth hormone your body makes. This reduces symptoms and the risk of health problems caused by too much growth hormone, such as diabetes and heart disease. It may also be used to treat neuroendocrine tumors, a cancer of the cells that release hormones and other substances in your body. It works by slowing down the release of these substances from the cells. This slows tumor growth. It   also decreases the symptoms of carcinoid syndrome, such as flushing or diarrhea. This medicine may be used for other purposes; ask your health care provider or pharmacist if you have questions. COMMON BRAND NAME(S): Somatuline [place] What should I tell my care team before I take this medication? They need to know  if you have any of these conditions: Diabetes Gallbladder disease Heart disease Kidney disease Liver disease Thyroid disease An unusual or allergic reaction to lanreotide, other medications, foods, dyes, or preservatives Pregnant or trying to get pregnant Breast-feeding How should I use this medication? This medication is injected under the skin. It is given by your care team in a hospital or clinic setting. Talk to your care team about the use of this medication in children. Special care may be needed. Overdosage: If you think you have taken too much of this medicine contact a poison control center or emergency room at once. NOTE: This medicine is only for you. Do not share this medicine with others. What if I miss a dose? Keep appointments for follow-up doses. It is important not to miss your dose. Call your care team if you are unable to keep an appointment. What may interact with this medication? Bromocriptine Cyclosporine Certain medications for blood pressure, heart disease, irregular heartbeat Certain medications for diabetes Quinidine Terfenadine This list may not describe all possible interactions. Give your health care provider a list of all the medicines, herbs, non-prescription drugs, or dietary supplements you use. Also tell them if you smoke, drink alcohol, or use illegal drugs. Some items may interact with your medicine. What should I watch for while using this medication? Visit your care team for regular checks on your progress. Tell your care team if your symptoms do not start to get better or if they get worse. Your condition will be monitored carefully while you are receiving this medication. You may need blood work while you are taking this medication. This medication may increase blood sugar. The risk may be higher in patients who already have diabetes. Ask your care team what you can do to lower your risk of diabetes while taking this medication. Talk to your care  team if you wish to become pregnant or think you may be pregnant. This medication can cause serious birth defects. Do not breast-feed while taking this medication and for 6 months after stopping therapy. This medication may cause infertility. Talk to your care team if you are concerned about your fertility. What side effects may I notice from receiving this medication? Side effects that you should report to your care team as soon as possible: Allergic reactions--skin rash, itching, hives, swelling of the face, lips, tongue, or throat Gallbladder problems--severe stomach pain, nausea, vomiting, fever High blood sugar (hyperglycemia)--increased thirst or amount of urine, unusual weakness or fatigue, blurry vision Increase in blood pressure Low blood sugar (hypoglycemia)--tremors or shaking, anxiety, sweating, cold or clammy skin, confusion, dizziness, rapid heartbeat Low thyroid levels (hypothyroidism)--unusual weakness or fatigue, increased sensitivity to cold, constipation, hair loss, dry skin, weight gain, feelings of depression Slow heartbeat--dizziness, feeling faint or lightheaded, confusion, trouble breathing, unusual weakness or fatigue Side effects that usually do not require medical attention (report to your care team if they continue or are bothersome): Diarrhea Dizziness Headache Muscle spasms Nausea Pain, redness, irritation, or bruising at the injection site Stomach pain This list may not describe all possible side effects. Call your doctor for medical advice about side effects. You may report side effects to FDA at 1-800-FDA-1088. Where   should I keep my medication? This medication is given in a hospital or clinic. It will not be stored at home. NOTE: This sheet is a summary. It may not cover all possible information. If you have questions about this medicine, talk to your doctor, pharmacist, or health care provider.  2024 Elsevier/Gold Standard (2021-10-11 00:00:00)   

## 2022-12-26 ENCOUNTER — Ambulatory Visit: Payer: Federal, State, Local not specified - PPO | Admitting: Cardiology

## 2022-12-27 ENCOUNTER — Ambulatory Visit: Payer: Federal, State, Local not specified - PPO | Admitting: Cardiology

## 2022-12-27 ENCOUNTER — Encounter: Payer: Self-pay | Admitting: *Deleted

## 2022-12-27 ENCOUNTER — Encounter: Payer: Self-pay | Admitting: Cardiology

## 2022-12-27 VITALS — BP 120/79 | HR 64 | Resp 17 | Ht 71.0 in | Wt 200.0 lb

## 2022-12-27 DIAGNOSIS — I2510 Atherosclerotic heart disease of native coronary artery without angina pectoris: Secondary | ICD-10-CM

## 2022-12-27 DIAGNOSIS — I10 Essential (primary) hypertension: Secondary | ICD-10-CM | POA: Diagnosis not present

## 2022-12-27 MED ORDER — ATORVASTATIN CALCIUM 40 MG PO TABS
40.0000 mg | ORAL_TABLET | Freq: Every day | ORAL | 3 refills | Status: DC
Start: 2022-12-27 — End: 2023-07-30

## 2022-12-27 MED ORDER — LOSARTAN POTASSIUM 100 MG PO TABS: 100.0000 mg | ORAL_TABLET | Freq: Every day | ORAL | 3 refills | Status: DC

## 2022-12-27 MED ORDER — ASPIRIN 81 MG PO CHEW: 81.0000 mg | CHEWABLE_TABLET | Freq: Every day | ORAL | 3 refills | Status: DC

## 2022-12-27 MED ORDER — AMLODIPINE BESYLATE 10 MG PO TABS
10.0000 mg | ORAL_TABLET | Freq: Every day | ORAL | 3 refills | Status: DC
Start: 2022-12-27 — End: 2023-07-30

## 2022-12-27 MED ORDER — NITROGLYCERIN 0.4 MG SL SUBL: 0.4000 mg | SUBLINGUAL_TABLET | SUBLINGUAL | 3 refills | Status: DC | PRN

## 2022-12-27 MED ORDER — LABETALOL HCL 200 MG PO TABS: 200.0000 mg | ORAL_TABLET | Freq: Two times a day (BID) | ORAL | 3 refills | Status: DC

## 2022-12-27 NOTE — Progress Notes (Signed)
Subjective:   William Hancock, male    DOB: 1965-09-25, 57 y.o.   MRN: [id number]   Chief complaint:  Coronary artery disease   HPI  57 year old Caucasian male with hypertension, hyperlipidemia, coronary artery disease, s/p multivessel complex PCI (pro & mid RCA, prox-mid LAD/Diag bifurcation) for NSTEMI in 06/2018, metastatic neuroendocrine tumor, now s/p potentially curative surgery  Patient is doing well.  He is exercising regularly without any complaints of chest pain or shortness of breath.  He has regular follow-up with his neuroendocrine tumor doctors.  Blood pressure is not very well-controlled.   Current Outpatient Medications:    amLODipine (NORVASC) 10 MG tablet, Take 1 tablet (10 mg total) by mouth daily., Disp: 90 tablet, Rfl: 3   aspirin (ASPIRIN LOW DOSE) 81 MG chewable tablet, CHEW ONE TABLET BY MOUTH DAILY, Disp: 90 tablet, Rfl: 1   atorvastatin (LIPITOR) 40 MG tablet, Take 1 tablet (40 mg total) by mouth daily., Disp: 90 tablet, Rfl: 3   cyanocobalamin (VITAMIN B12) 1000 MCG tablet, Take 1,000 mcg by mouth in the morning. sublinguinal, Disp: , Rfl:    labetalol (NORMODYNE) 200 MG tablet, Take 1 tablet (200 mg total) by mouth 2 (two) times daily., Disp: 180 tablet, Rfl: 3   levothyroxine (SYNTHROID) 125 MCG tablet, Take 125 mcg by mouth daily before breakfast., Disp: , Rfl:    losartan (COZAAR) 100 MG tablet, Take 1 tablet (100 mg total) by mouth daily., Disp: 90 tablet, Rfl: 3   nitroGLYCERIN (NITROSTAT) 0.4 MG SL tablet, Place 1 tablet (0.4 mg total) under the tongue every 5 (five) minutes x 3 doses as needed for chest pain., Disp: 25 tablet, Rfl: 3   Zinc 30 MG TABS, Take by mouth in the morning., Disp: , Rfl:   Cardiovascular studies:  EKG 12/27/2022: Sinus rhythm 62 bpm First degree A-V block  Occasional PVC  Renal artery duplex 08/13/2022:   Right: No evidence of right renal artery stenosis. Normal right         Resisitive Index. Normal size right kidney. RRV  flow present.  Left:  No evidence of left renal artery stenosis. Normal left         Resistive Index. Normal size of left kidney. LRV flow         present.  Mesenteric:  Normal Celiac artery and Superior Mesenteric artery findings.     Echocardiogram 05/30/2021: Normal LV systolic function with visual EF 60-65%. Left ventricle cavity is normal in size. Normal left ventricular wall thickness. Normal global wall motion. Normal diastolic filling pattern, normal LAP. Calculated EF 59%. Mild tricuspid regurgitation. No evidence of pulmonary hypertension. Compared to study 06/06/2018 no significant change.  Coronary intervention 06/09/2018: LM: Normal  LAD: Prox to mid LAD/Diag1 bifurcation severe calcific 80% stenosis        Diag 1 mid 75% stenosis        Atherectomy and prox LAD into Diag         Synergy DES 2.75 X 16 mm DES        Overlapping Stent into prox LAD Synergy DES 3.0 X 38 mm        Provisional stent mid LAD with minicrush technique Synergy DES 2.75 X 16 mm DES Ramus: 50 % proximal disease LCx: Mild luminal irregularities RCA: Stenting performed 06/06/2018        Severe mid 99% stenosis--->Orsero 3.5 X 26 mm Orsero DES         Post dilatation with 4.0 mm [place] balloon  Severe prox 80% stenosis--->Orsero 3.5 X 9 mm DES        Post dilatation with 4.0 mm [place] balloon   Recommendation: DAPT with Aspirin and brilinta for 1 year Aggressive risk factor modification  Hospital echocardiogram 06/06/2018: - Left ventricle: The cavity size was normal. There was mild   concentric hypertrophy. Systolic function was normal. The   estimated ejection fraction was in the range of 55% to 60%. Wall   motion was normal; there were no regional wall motion   abnormalities. Doppler parameters are consistent with abnormal   left ventricular relaxation (grade 1 diastolic dysfunction). - No significant valvular abnormality.   Recent labs: 10/09/2022: Glucose 100, BUN/Cr 21/1.51. EGFR 53. Na/K  140/4/0. T.bili 1.8. Rest of the CMP normal  05/15/2022: Glucose 112, BUN/Cr 18/1.17. EGFR >60. Na/K 140/3.8. AlKP 157. Rest of the CMP normal H/H 13/39. MCV 88. Platelets 230  11/03/2021: Chol 106, TG 88, HDL 42, LDL 47  03/21/2021: Glucose 101, BUN/Cr 22/1.39. EGFR 59. Na/K 140/4.8. T.bili 1.3. Rest of the CMP normal H/H 14/41. MCV 90. Platelets 186  Review of Systems  Review of Systems  Cardiovascular:  Negative for chest pain, dyspnea on exertion, leg swelling, palpitations and syncope.         Vitals:   12/27/22 1340  BP: 120/79  Pulse: 64  Resp: 17  SpO2: 96%      Objective:   Physical Exam  Physical Exam Vitals and nursing note reviewed.  Constitutional:      General: He is not in acute distress.    Appearance: He is well-developed.  Neck:     Vascular: No JVD.  Cardiovascular:     Rate and Rhythm: Normal rate and regular rhythm.     Pulses: Intact distal pulses.     Heart sounds: Normal heart sounds. No murmur heard. Pulmonary:     Effort: Pulmonary effort is normal.     Breath sounds: Normal breath sounds. No wheezing or rales.  Musculoskeletal:     Right lower leg: No edema.     Left lower leg: No edema.            Assessment & Recommendations:   57 y/o Caucasian male with hypertension, hyperlipidemia, coronary artery disease, s/p multivessel complex PCI (pro & mid RCA, prox-mid LAD/Diag bifurcation) for NSTEMI in 06/2018, metastatic neuroendocrine tumor, now s/p potentially curative surgery  CAD without angina: S/p multilvessel PCI for NSTEMI 06/2018 Doing well without angina symptoms Continue aspirin 81 mg daily.   Continue amlodipine 10 mg daily. Continue lipitor 40 mg daily.  Chol 106, TG 88, HDL 42, LDL 47 (11/2021). Check lipid panel.  Hypertension: Now very well controlled on labetalol 200 mg twice daily, amlodipine 10 mg daily, losartan 100 mg daily.  Refilled the same.  Metastatic neuroendocrine tumor: Now s/p  surgery. Continue f/u w/Dr. Truett Perna.   F/u in 1 year     Elder Negus, MD Pager: [phone] Office: [phone]

## 2023-01-07 ENCOUNTER — Inpatient Hospital Stay: Payer: Federal, State, Local not specified - PPO | Attending: Nurse Practitioner

## 2023-01-07 VITALS — BP 123/83 | HR 73 | Temp 97.9°F | Resp 18 | Ht 71.0 in | Wt 201.8 lb

## 2023-01-07 DIAGNOSIS — C7B02 Secondary carcinoid tumors of liver: Secondary | ICD-10-CM | POA: Insufficient documentation

## 2023-01-07 DIAGNOSIS — D3A098 Benign carcinoid tumors of other sites: Secondary | ICD-10-CM

## 2023-01-07 DIAGNOSIS — C7A098 Malignant carcinoid tumors of other sites: Secondary | ICD-10-CM | POA: Diagnosis not present

## 2023-01-07 DIAGNOSIS — C7B04 Secondary carcinoid tumors of peritoneum: Secondary | ICD-10-CM | POA: Diagnosis not present

## 2023-01-07 MED ORDER — LANREOTIDE ACETATE 120 MG/0.5ML ~~LOC~~ SOLN
120.0000 mg | Freq: Once | SUBCUTANEOUS | Status: AC
  Administered 2023-01-07: 120 mg via SUBCUTANEOUS
  Filled 2023-01-07: qty 120

## 2023-01-07 NOTE — Patient Instructions (Signed)
[facility]   Discharge Instructions: Thank you for choosing [facility] to provide your oncology and hematology care.   If you have a lab appointment with the Cancer Center, please go directly to the Cancer Center and check in at the registration area.   Wear comfortable clothing and clothing appropriate for easy access to any Portacath or PICC line.   We strive to give you quality time with your provider. You may need to reschedule your appointment if you arrive late (15 or more minutes).  Arriving late affects you and other patients whose appointments are after yours.  Also, if you miss three or more appointments without notifying the office, you may be dismissed from the clinic at the provider's discretion.      For prescription refill requests, have your pharmacy contact our office and allow 72 hours for refills to be completed.    Today you received the following chemotherapy and/or immunotherapy agents Lanreotide.      To help prevent nausea and vomiting after your treatment, we encourage you to take your nausea medication as directed.  BELOW ARE SYMPTOMS THAT SHOULD BE REPORTED IMMEDIATELY: *FEVER GREATER THAN 100.4 F (38 C) OR HIGHER *CHILLS OR SWEATING *NAUSEA AND VOMITING THAT IS NOT CONTROLLED WITH YOUR NAUSEA MEDICATION *UNUSUAL SHORTNESS OF BREATH *UNUSUAL BRUISING OR BLEEDING *URINARY PROBLEMS (pain or burning when urinating, or frequent urination) *BOWEL PROBLEMS (unusual diarrhea, constipation, pain near the anus) TENDERNESS IN MOUTH AND THROAT WITH OR WITHOUT PRESENCE OF ULCERS (sore throat, sores in mouth, or a toothache) UNUSUAL RASH, SWELLING OR PAIN  UNUSUAL VAGINAL DISCHARGE OR ITCHING   Items with * indicate a potential emergency and should be followed up as soon as possible or go to the Emergency Department if any problems should occur.  Please show the CHEMOTHERAPY ALERT CARD or IMMUNOTHERAPY ALERT CARD at  check-in to the Emergency Department and triage nurse.  Should you have questions after your visit or need to cancel or reschedule your appointment, please contact [facility]  Dept: [phone]  and follow the prompts.  Office hours are 8:00 a.m. to 4:30 p.m. Monday - Friday. Please note that voicemails left after 4:00 p.m. may not be returned until the following business day.  We are closed weekends and major holidays. You have access to a nurse at all times for urgent questions. Please call the main number to the clinic Dept: [phone] and follow the prompts.   For any non-urgent questions, you may also contact your provider using MyChart. We now offer e-Visits for anyone 18 and older to request care online for non-urgent symptoms. For details visit [email or web address].   Also download the MyChart app! Go to the app store, search "MyChart", open the app, select , and log in with your MyChart username and password.  Lanreotide Injection What is this medication? LANREOTIDE (lan REE oh tide) treats high levels of growth hormone (acromegaly). It is used when other therapies have not worked well enough or cannot be tolerated. It works by reducing the amount of growth hormone your body makes. This reduces symptoms and the risk of health problems caused by too much growth hormone, such as diabetes and heart disease. It may also be used to treat neuroendocrine tumors, a cancer of the cells that release hormones and other substances in your body. It works by slowing down the release of these substances from the cells. This slows tumor growth. It   also decreases the symptoms of carcinoid syndrome, such as flushing or diarrhea. This medicine may be used for other purposes; ask your health care provider or pharmacist if you have questions. COMMON BRAND NAME(S): Somatuline Depot What should I tell my care team before I take this medication? They need to know  if you have any of these conditions: Diabetes Gallbladder disease Heart disease Kidney disease Liver disease Thyroid disease An unusual or allergic reaction to lanreotide, other medications, foods, dyes, or preservatives Pregnant or trying to get pregnant Breast-feeding How should I use this medication? This medication is injected under the skin. It is given by your care team in a hospital or clinic setting. Talk to your care team about the use of this medication in children. Special care may be needed. Overdosage: If you think you have taken too much of this medicine contact a poison control center or emergency room at once. NOTE: This medicine is only for you. Do not share this medicine with others. What if I miss a dose? Keep appointments for follow-up doses. It is important not to miss your dose. Call your care team if you are unable to keep an appointment. What may interact with this medication? Bromocriptine Cyclosporine Certain medications for blood pressure, heart disease, irregular heartbeat Certain medications for diabetes Quinidine Terfenadine This list may not describe all possible interactions. Give your health care provider a list of all the medicines, herbs, non-prescription drugs, or dietary supplements you use. Also tell them if you smoke, drink alcohol, or use illegal drugs. Some items may interact with your medicine. What should I watch for while using this medication? Visit your care team for regular checks on your progress. Tell your care team if your symptoms do not start to get better or if they get worse. Your condition will be monitored carefully while you are receiving this medication. You may need blood work while you are taking this medication. This medication may increase blood sugar. The risk may be higher in patients who already have diabetes. Ask your care team what you can do to lower your risk of diabetes while taking this medication. Talk to your care  team if you wish to become pregnant or think you may be pregnant. This medication can cause serious birth defects. Do not breast-feed while taking this medication and for 6 months after stopping therapy. This medication may cause infertility. Talk to your care team if you are concerned about your fertility. What side effects may I notice from receiving this medication? Side effects that you should report to your care team as soon as possible: Allergic reactions--skin rash, itching, hives, swelling of the face, lips, tongue, or throat Gallbladder problems--severe stomach pain, nausea, vomiting, fever High blood sugar (hyperglycemia)--increased thirst or amount of urine, unusual weakness or fatigue, blurry vision Increase in blood pressure Low blood sugar (hypoglycemia)--tremors or shaking, anxiety, sweating, cold or clammy skin, confusion, dizziness, rapid heartbeat Low thyroid levels (hypothyroidism)--unusual weakness or fatigue, increased sensitivity to cold, constipation, hair loss, dry skin, weight gain, feelings of depression Slow heartbeat--dizziness, feeling faint or lightheaded, confusion, trouble breathing, unusual weakness or fatigue Side effects that usually do not require medical attention (report to your care team if they continue or are bothersome): Diarrhea Dizziness Headache Muscle spasms Nausea Pain, redness, irritation, or bruising at the injection site Stomach pain This list may not describe all possible side effects. Call your doctor for medical advice about side effects. You may report side effects to FDA at 1-800-FDA-1088. Where   should I keep my medication? This medication is given in a hospital or clinic. It will not be stored at home. NOTE: This sheet is a summary. It may not cover all possible information. If you have questions about this medicine, talk to your doctor, pharmacist, or health care provider.  2024 Elsevier/Gold Standard (2021-10-11 00:00:00)   

## 2023-01-09 DIAGNOSIS — C7A095 Malignant carcinoid tumor of the midgut NOS: Secondary | ICD-10-CM | POA: Diagnosis not present

## 2023-01-09 DIAGNOSIS — I2510 Atherosclerotic heart disease of native coronary artery without angina pectoris: Secondary | ICD-10-CM | POA: Diagnosis not present

## 2023-01-28 ENCOUNTER — Ambulatory Visit (HOSPITAL_COMMUNITY)
Admission: RE | Admit: 2023-01-28 | Discharge: 2023-01-28 | Disposition: A | Discharge status: Home / Self Care | Payer: Federal, State, Local not specified - PPO | Source: Ambulatory Visit | Attending: Oncology | Admitting: Oncology

## 2023-01-28 DIAGNOSIS — C787 Secondary malignant neoplasm of liver and intrahepatic bile duct: Secondary | ICD-10-CM | POA: Diagnosis not present

## 2023-01-28 DIAGNOSIS — C7A8 Other malignant neuroendocrine tumors: Secondary | ICD-10-CM | POA: Diagnosis not present

## 2023-01-28 DIAGNOSIS — D3A098 Benign carcinoid tumors of other sites: Secondary | ICD-10-CM | POA: Diagnosis not present

## 2023-01-28 MED ORDER — GADOBUTROL 1 MMOL/ML IV SOLN
9.0000 mL | Freq: Once | INTRAVENOUS | Status: AC | PRN
  Administered 2023-01-28: 9 mL via INTRAVENOUS

## 2023-01-31 ENCOUNTER — Inpatient Hospital Stay: Payer: Federal, State, Local not specified - PPO

## 2023-01-31 ENCOUNTER — Inpatient Hospital Stay: Payer: Federal, State, Local not specified - PPO | Admitting: Oncology

## 2023-01-31 ENCOUNTER — Inpatient Hospital Stay
Admission: RE | Admit: 2023-01-31 | Discharge: 2023-01-31 | Disposition: A | Discharge status: Home / Self Care | Payer: Self-pay | Source: Ambulatory Visit | Attending: Oncology | Admitting: Oncology

## 2023-01-31 ENCOUNTER — Other Ambulatory Visit: Payer: Self-pay | Admitting: *Deleted

## 2023-01-31 VITALS — BP 124/82 | HR 61 | Temp 98.1°F | Resp 18 | Ht 71.0 in | Wt 198.7 lb

## 2023-01-31 DIAGNOSIS — D3A098 Benign carcinoid tumors of other sites: Secondary | ICD-10-CM

## 2023-01-31 DIAGNOSIS — C7B04 Secondary carcinoid tumors of peritoneum: Secondary | ICD-10-CM | POA: Diagnosis not present

## 2023-01-31 DIAGNOSIS — C7B02 Secondary carcinoid tumors of liver: Secondary | ICD-10-CM | POA: Diagnosis not present

## 2023-01-31 DIAGNOSIS — C7A098 Malignant carcinoid tumors of other sites: Secondary | ICD-10-CM | POA: Diagnosis not present

## 2023-01-31 LAB — CBC WITH DIFFERENTIAL (CANCER CENTER ONLY)
Abs Immature Granulocytes: 0.02 10*3/uL (ref 0.00–0.07)
Basophils Absolute: 0.1 10*3/uL (ref 0.0–0.1)
Basophils Relative: 1 %
Eosinophils Absolute: 0.3 10*3/uL (ref 0.0–0.5)
Eosinophils Relative: 7 %
HCT: 38.1 % — ABNORMAL LOW (ref 39.0–52.0)
Hemoglobin: 13.6 g/dL (ref 13.0–17.0)
Immature Granulocytes: 0 %
Lymphocytes Relative: 28 %
Lymphs Abs: 1.4 10*3/uL (ref 0.7–4.0)
MCH: 33.7 pg (ref 26.0–34.0)
MCHC: 35.7 g/dL (ref 30.0–36.0)
MCV: 94.3 fL (ref 80.0–100.0)
Monocytes Absolute: 0.4 10*3/uL (ref 0.1–1.0)
Monocytes Relative: 8 %
Neutro Abs: 2.9 10*3/uL (ref 1.7–7.7)
Neutrophils Relative %: 56 %
Platelet Count: 155 10*3/uL (ref 150–400)
RBC: 4.04 MIL/uL — ABNORMAL LOW (ref 4.22–5.81)
RDW: 12 % (ref 11.5–15.5)
WBC Count: 5.1 10*3/uL (ref 4.0–10.5)
nRBC: 0 % (ref 0.0–0.2)

## 2023-01-31 LAB — CMP (CANCER CENTER ONLY)
ALT: 30 U/L (ref 0–44)
AST: 29 U/L (ref 15–41)
Albumin: 4.6 g/dL (ref 3.5–5.0)
Alkaline Phosphatase: 107 U/L (ref 38–126)
Anion gap: 9 (ref 5–15)
BUN: 20 mg/dL (ref 6–20)
CO2: 24 mmol/L (ref 22–32)
Calcium: 9.1 mg/dL (ref 8.9–10.3)
Chloride: 107 mmol/L (ref 98–111)
Creatinine: 1.47 mg/dL — ABNORMAL HIGH (ref 0.61–1.24)
GFR, Estimated: 55 mL/min — ABNORMAL LOW (ref >60)
Glucose, Bld: 129 mg/dL — ABNORMAL HIGH (ref 70–99)
Potassium: 3.9 mmol/L (ref 3.5–5.1)
Sodium: 140 mmol/L (ref 135–145)
Total Bilirubin: 1.7 mg/dL — ABNORMAL HIGH (ref 0.3–1.2)
Total Protein: 7.3 g/dL (ref 6.5–8.1)

## 2023-01-31 MED ORDER — LANREOTIDE ACETATE 120 MG/0.5ML ~~LOC~~ SOLN
120.0000 mg | Freq: Once | SUBCUTANEOUS | Status: AC
  Administered 2023-01-31: 120 mg via SUBCUTANEOUS
  Filled 2023-01-31: qty 120

## 2023-01-31 NOTE — Progress Notes (Signed)
[facility] OFFICE PROGRESS NOTE   Diagnosis: Carcinoid tumor  INTERVAL HISTORY:   Mr. Coffer returns as scheduled.  He feels well.  No fever, flushing, or diarrhea.  He is exercising.  He continues monthly lanreotide.  Objective:  Vital signs in last 24 hours:  Blood pressure 124/82, pulse 61, temperature 98.1 F (36.7 C), temperature source Oral, resp. rate 18, height 5\' 11"  (1.803 m), weight 198 lb 11.2 oz (90.1 kg), SpO2 100%.     Resp: Lungs clear bilaterally Cardio: Regular rate and rhythm GI: Nontender, no hepatosplenomegaly Vascular: No leg edema  Lab Results:  Lab Results  Component Value Date   WBC 10.3 05/15/2022   HGB 13.7 05/15/2022   HCT 39.7 05/15/2022   MCV 88.8 05/15/2022   PLT 230 05/15/2022   NEUTROABS 8.3 (H) 05/15/2022    CMP  Lab Results  Component Value Date   NA 140 10/09/2022   K 4.0 10/09/2022   CL 108 10/09/2022   CO2 24 10/09/2022   GLUCOSE 100 (H) 10/09/2022   BUN 21 (H) 10/09/2022   CREATININE 1.51 (H) 10/09/2022   CALCIUM 9.1 10/09/2022   PROT 7.6 10/09/2022   ALBUMIN 4.7 10/09/2022   AST 34 10/09/2022   ALT 33 10/09/2022   ALKPHOS 113 10/09/2022   BILITOT 1.8 (H) 10/09/2022   GFRNONAA 53 (L) 10/09/2022   GFRAA >60 02/26/2020    No results found for: "CEA1", "CEA", "CAN199", "CA125"  Lab Results  Component Value Date   INR 1.0 11/13/2018   LABPROT 13.0 11/13/2018    Imaging:  MR Abdomen W Wo Contrast  Result Date: 01/30/2023 CLINICAL DATA:  Metastatic neuroendocrine tumor. EXAM: MRI ABDOMEN WITHOUT AND WITH CONTRAST TECHNIQUE: Multiplanar multisequence MR imaging of the abdomen was performed both before and after the administration of intravenous contrast. CONTRAST:  9mL GADAVIST GADOBUTROL 1 MMOL/ML IV SOLN COMPARISON:  08/22/2022 FINDINGS: Lower chest: Unremarkable. Hepatobiliary: Innumerable liver metastases again noted. Index 2.3 cm left hepatic lobe lesion measured previously is 2.3 cm again today  on image 12 of T2 fat-suppressed series 9. Index lesion in segment IV measures 17 mm today on 14/9 compared to 16 mm previously (remeasured). Anterior right hepatic lobe index lesion measuring 18 mm on 16/9 today was 20 mm previously (remeasured). Cholecystectomy.  No intrahepatic or extrahepatic biliary dilation. Pancreas: No focal mass lesion. No dilatation of the main duct. No intraparenchymal cyst. No peripancreatic edema. Spleen:  No splenomegaly. No suspicious focal mass lesion. Adrenals/Urinary Tract: No adrenal nodule or mass. Kidneys unremarkable. Stomach/Bowel: Stomach is unremarkable. No gastric wall thickening. No evidence of outlet obstruction. Duodenum is normally positioned as is the ligament of Treitz. No small bowel or colonic dilatation within the visualized abdomen. Vascular/Lymphatic: No abdominal aortic aneurysm. No abdominal lymphadenopathy Other:  No intraperitoneal free fluid. Musculoskeletal: No focal suspicious marrow enhancement within the visualized bony anatomy. IMPRESSION: Innumerable liver metastases again noted. Index lesions are stable to slightly decreased in size since the prior study. No new or progressive findings. Electronically Signed   By: Kennith Center M.D.   On: 01/30/2023 15:55    Medications: I have reviewed the patient's current medications.   Assessment/Plan: Metastatic neuroendocrine tumor, well-differentiated 10/31/2018 Abdominal ultrasound-numerous nearly confluent heterogeneous masses measuring up to 3.5 cm.   11/04/2018 CT abdomen/pelvis-multiple enhancing hepatic lesions and a central partially calcified spiculated mesenteric mass measuring 3.1 x 3.1 cm.   11/13/2018 biopsy liver lesion-metastatic well-differentiated neuroendocrine tumor positive for CKAE1-AE3, synaptophysin, chromogranin, CD56 and CDX-2; Ki-67  labeling index less than 3%; TTF-1 negative Netspot 12/12/2018- increased activity associated a lobular mesenteric mass, small bowel medial to the  mesenteric mass, and multiple liver lesions Elevated chromogranin A level and 24-hour urine 5 HIAA Monthly lanreotide beginning 12/23/2018 CT 05/13/2019-multiple large hepatic metastases on the dotatate are poorly visualized, several small lesions are decreased in size 1 new lesion, stable mesenteric mass Netspot 09/04/2019-increase in size of left and right liver lesions, stable to slightly decreased radiotracer accumulation, stable central mesenteric mass, no new metastatic lesions Lutathera 10/13/2019 Lutathera 12/04/2019 Lutathera 01/29/2020 Lutathera 03/25/2020 Restaging Netspot 04/22/2020-overall positive measurable response to therapy.  Multiple hepatic metastasis continue to demonstrate intense radiotracer activity, the activity is consistently decreased from preradiotherapy scan.  No evidence of progressive disease in the liver.  Central mesenteric metastasis and adjacent small bowel lesion have very similar metabolic activity and no change in size.  No evidence of new peritoneal metastasis or skeletal metastasis. Monthly lanreotide continued Netspot 03/02/2021-stable radiotracer avid confluent left and right liver lesions, stable central mesenteric nodal metastasis, no new sites of metastatic disease Monthly lanreotide continued MRI abdomen 05/22/2021-innumerable confluent contrast-enhancing liver lesions-not significantly changed compared to the PET/CT, stable mesenteric metastasis 07/19/2021-bland embolization of right hepatic metastases with micro particles in [place] 08/16/2021-MRI abdomen without contrast-innumerable hepatic masses, decreased in size and overall bulk, especially in the right hepatic segments 5-8.  Stable central mesenteric mass 11/20/2021-dotatate PET-confluent radiotracer uptake at the right hepatic dome, elongated area of radiotracer uptake at the anterior superior left hepatic lobe, small focus of uptake at the caudate lobe, radiotracer uptake associated with a partially  calcified mesenteric mass and adjacent small bowel loop, no new sites of metastatic disease 11/21/2021-bland bleed/particle embolization of the left hepatic artery 01/24/2022-small bowel resection with anastomosis, cholecystectomy and hepatic metastasectomy (gallbladder with chronic cholecystitis with cholelithiasis; mesenteric lymph nodes with 2 of 22 lymph nodes positive for metastatic well differentiated neuroendocrine tumor; small bowel resection with well-differentiated neuroendocrine tumor, 2 cm, WHO grade 2, Ki-67 index approximately 3%, tumor extends from the mucosa to the serosa, mesenteric tumor deposit with necrosis and calcifications, 6 cm in greatest diameter (Ki-67 index approximately 8%), lymphovascular invasion present, perineural invasion present, margins negative for tumor; additional segment small bowel resection-benign small bowel with normal villous architecture and no significant histopathologic change, negative for tumor; liver segment 2 resection with metastatic well differentiated neuroendocrine tumor with extensive necrosis, 2.5 cm in greatest diameter; liver additional segment 2 resection metastatic well-differentiated neuroendocrine tumor with extensive necrosis, 5.1 cm in greatest diameter ERCP 02/02/2022, stent placed (report could not be located in care everywhere) 02/02/2022 exploratory laparotomy with small bowel resection-gangrenous necrosis with acute chronic inflammation; acute serositis with adhesions 02/04/2022 exploratory laparotomy with small bowel resection, appendectomy and small bowel-small bowel anastomosis-benign appendix with periappendiceal subacute and chronic hemorrhagic serosal reaction, no evidence of carcinoid/neuroendocrine tumor Decreased chromogranin A 03/21/2022 MRI abdomen 05/23/2022-overall decrease in size of innumerable bilobar hepatic metastases, no new hepatic lesions, postsurgical changes with interval resection of the central mesenteric mass Dotatate  PET 06/11/2022-decrease in number of radiotracer avid metastases in the liver, remaining metastatic lesions in the right liver, interval resection of central mesenteric mass with partial small bowel resection, no mesenteric or small bowel lesions present MRI abdomen 08/30/2022-innumerable by lobar hepatic metastases, mildly improved from 05/23/2022 MRI Dotatate PET 10/18/2022-compared to 11/20/2021, similar appearance of right hepatic lobe and caudate avid metastatic disease with exception of a new focus of the posterior right hepatic lobe, new SSR positive nonenlarged left paratracheal  node Lanreotide continued MRI abdomen 01/28/2023-innumerable liver lesions, stable to slightly decreased in size.  No progressive disease Lanreotide continued History of MI/stent placement CAD Hypertension Renal insufficiency Hypothyroidism diagnosed with markedly elevated TSH and low T4 on 12/18/2018 Acute left elbow/right ankle pain and swelling August 2021 February 2023-acute renal failure following liver embolization procedure, improved        Disposition: Mr. Colaluca.  Stable.  The restaging MRI reveals no evidence of disease progression.  The plan is to continue monthly lanreotide.  We will follow-up on the chromogranin a level from today.  He will return for an office visit in 4 months.  He will be referred for a restaging chest CT to follow-up on the left paratracheal lymph node noted on the dotatate PET in May.  Thornton Papas, MD  01/31/2023  8:14 AM

## 2023-01-31 NOTE — Progress Notes (Signed)
Requested PET images from 10/18/22 be imported to [facility] and requested MRI 01/28/23 be pushed over to their system.

## 2023-02-01 ENCOUNTER — Telehealth: Payer: Self-pay | Admitting: *Deleted

## 2023-02-01 LAB — CHROMOGRANIN A

## 2023-02-01 NOTE — Telephone Encounter (Signed)
Radiology having difficulty getting PET scan from 10/18/22 sent to [facility] system. Called [facility] and left VM requesting nurse Aurther Loft working with Dr. Lissa Merlin to call office to assist with this.

## 2023-02-05 ENCOUNTER — Other Ambulatory Visit: Payer: Self-pay | Admitting: *Deleted

## 2023-02-05 ENCOUNTER — Encounter: Payer: Self-pay | Admitting: Oncology

## 2023-02-05 DIAGNOSIS — D3A098 Benign carcinoid tumors of other sites: Secondary | ICD-10-CM

## 2023-02-05 NOTE — Progress Notes (Signed)
Determined that lab did not process blood sample properly to test for chromogranin A last week. Mr. Berkovitz will have it recollected at next injection appointment.

## 2023-02-27 ENCOUNTER — Inpatient Hospital Stay: Payer: Federal, State, Local not specified - PPO | Attending: Nurse Practitioner

## 2023-02-27 ENCOUNTER — Inpatient Hospital Stay: Payer: Federal, State, Local not specified - PPO

## 2023-02-27 ENCOUNTER — Other Ambulatory Visit (HOSPITAL_BASED_OUTPATIENT_CLINIC_OR_DEPARTMENT_OTHER): Payer: Self-pay

## 2023-02-27 VITALS — BP 115/76 | HR 64 | Temp 98.2°F | Resp 48 | Ht 71.0 in | Wt 202.9 lb

## 2023-02-27 DIAGNOSIS — C7B04 Secondary carcinoid tumors of peritoneum: Secondary | ICD-10-CM | POA: Insufficient documentation

## 2023-02-27 DIAGNOSIS — C7A098 Malignant carcinoid tumors of other sites: Secondary | ICD-10-CM | POA: Insufficient documentation

## 2023-02-27 DIAGNOSIS — C7B02 Secondary carcinoid tumors of liver: Secondary | ICD-10-CM | POA: Insufficient documentation

## 2023-02-27 DIAGNOSIS — D3A098 Benign carcinoid tumors of other sites: Secondary | ICD-10-CM

## 2023-02-27 MED ORDER — FLULAVAL 0.5 ML IM SUSY
PREFILLED_SYRINGE | INTRAMUSCULAR | 0 refills | Status: DC
  Filled 2023-02-27: qty 0.5, 1d supply, fill #0

## 2023-02-27 MED ORDER — LANREOTIDE ACETATE 120 MG/0.5ML ~~LOC~~ SOLN
120.0000 mg | Freq: Once | SUBCUTANEOUS | Status: AC
  Administered 2023-02-27: 120 mg via SUBCUTANEOUS

## 2023-02-27 NOTE — Patient Instructions (Signed)
[place] [facility]   Discharge Instructions: Thank you for choosing [facility] to provide your oncology and hematology care.   If you have a lab appointment with the Cancer Center, please go directly to the Cancer Center and check in at the registration area.   Wear comfortable clothing and clothing appropriate for easy access to any Portacath or PICC line.   We strive to give you quality time with your provider. You may need to reschedule your appointment if you arrive late (15 or more minutes).  Arriving late affects you and other patients whose appointments are after yours.  Also, if you miss three or more appointments without notifying the office, you may be dismissed from the clinic at the provider's discretion.      For prescription refill requests, have your pharmacy contact our office and allow 72 hours for refills to be completed.    Today you received the following chemotherapy and/or immunotherapy agents Lanreotide      To help prevent nausea and vomiting after your treatment, we encourage you to take your nausea medication as directed.  BELOW ARE SYMPTOMS THAT SHOULD BE REPORTED IMMEDIATELY: *FEVER GREATER THAN 100.4 F (38 C) OR HIGHER *CHILLS OR SWEATING *NAUSEA AND VOMITING THAT IS NOT CONTROLLED WITH YOUR NAUSEA MEDICATION *UNUSUAL SHORTNESS OF BREATH *UNUSUAL BRUISING OR BLEEDING *URINARY PROBLEMS (pain or burning when urinating, or frequent urination) *BOWEL PROBLEMS (unusual diarrhea, constipation, pain near the anus) TENDERNESS IN MOUTH AND THROAT WITH OR WITHOUT PRESENCE OF ULCERS (sore throat, sores in mouth, or a toothache) UNUSUAL RASH, SWELLING OR PAIN  UNUSUAL VAGINAL DISCHARGE OR ITCHING   Items with * indicate a potential emergency and should be followed up as soon as possible or go to the Emergency Department if any problems should occur.  Please show the CHEMOTHERAPY ALERT CARD or IMMUNOTHERAPY ALERT CARD at  check-in to the Emergency Department and triage nurse.  Should you have questions after your visit or need to cancel or reschedule your appointment, please contact [place] [facility]  Dept: [phone]  and follow the prompts.  Office hours are 8:00 a.m. to 4:30 p.m. Monday - Friday. Please note that voicemails left after 4:00 p.m. may not be returned until the following business day.  We are closed weekends and major holidays. You have access to a nurse at all times for urgent questions. Please call the main number to the clinic Dept: [phone] and follow the prompts.   For any non-urgent questions, you may also contact your provider using MyChart. We now offer e-Visits for anyone 44 and older to request care online for non-urgent symptoms. For details visit [email or web address].   Also download the MyChart app! Go to the app store, search "MyChart", open the app, select , and log in with your MyChart username and password.  Lanreotide Injection What is this medication? LANREOTIDE (lan REE oh tide) treats high levels of growth hormone (acromegaly). It is used when other therapies have not worked well enough or cannot be tolerated. It works by reducing the amount of growth hormone your body makes. This reduces symptoms and the risk of health problems caused by too much growth hormone, such as diabetes and heart disease. It may also be used to treat neuroendocrine tumors, a cancer of the cells that release hormones and other substances in your body. It works by slowing down the release of these substances from the cells. This slows tumor growth. It  also decreases the symptoms of carcinoid syndrome, such as flushing or diarrhea. This medicine may be used for other purposes; ask your health care provider or pharmacist if you have questions. COMMON BRAND NAME(S): Somatuline Depot What should I tell my care team before I take this medication? They need to know  if you have any of these conditions: Diabetes Gallbladder disease Heart disease Kidney disease Liver disease Thyroid disease An unusual or allergic reaction to lanreotide, other medications, foods, dyes, or preservatives Pregnant or trying to get pregnant Breast-feeding How should I use this medication? This medication is injected under the skin. It is given by your care team in a hospital or clinic setting. Talk to your care team about the use of this medication in children. Special care may be needed. Overdosage: If you think you have taken too much of this medicine contact a poison control center or emergency room at once. NOTE: This medicine is only for you. Do not share this medicine with others. What if I miss a dose? Keep appointments for follow-up doses. It is important not to miss your dose. Call your care team if you are unable to keep an appointment. What may interact with this medication? Bromocriptine Cyclosporine Certain medications for blood pressure, heart disease, irregular heartbeat Certain medications for diabetes Quinidine Terfenadine This list may not describe all possible interactions. Give your health care provider a list of all the medicines, herbs, non-prescription drugs, or dietary supplements you use. Also tell them if you smoke, drink alcohol, or use illegal drugs. Some items may interact with your medicine. What should I watch for while using this medication? Visit your care team for regular checks on your progress. Tell your care team if your symptoms do not start to get better or if they get worse. Your condition will be monitored carefully while you are receiving this medication. You may need blood work while you are taking this medication. This medication may increase blood sugar. The risk may be higher in patients who already have diabetes. Ask your care team what you can do to lower your risk of diabetes while taking this medication. Talk to your care  team if you wish to become pregnant or think you may be pregnant. This medication can cause serious birth defects. Do not breast-feed while taking this medication and for 6 months after stopping therapy. This medication may cause infertility. Talk to your care team if you are concerned about your fertility. What side effects may I notice from receiving this medication? Side effects that you should report to your care team as soon as possible: Allergic reactions--skin rash, itching, hives, swelling of the face, lips, tongue, or throat Gallbladder problems--severe stomach pain, nausea, vomiting, fever High blood sugar (hyperglycemia)--increased thirst or amount of urine, unusual weakness or fatigue, blurry vision Increase in blood pressure Low blood sugar (hypoglycemia)--tremors or shaking, anxiety, sweating, cold or clammy skin, confusion, dizziness, rapid heartbeat Low thyroid levels (hypothyroidism)--unusual weakness or fatigue, increased sensitivity to cold, constipation, hair loss, dry skin, weight gain, feelings of depression Slow heartbeat--dizziness, feeling faint or lightheaded, confusion, trouble breathing, unusual weakness or fatigue Side effects that usually do not require medical attention (report to your care team if they continue or are bothersome): Diarrhea Dizziness Headache Muscle spasms Nausea Pain, redness, irritation, or bruising at the injection site Stomach pain This list may not describe all possible side effects. Call your doctor for medical advice about side effects. You may report side effects to FDA at 1-800-FDA-1088. Where  should I keep my medication? This medication is given in a hospital or clinic. It will not be stored at home. NOTE: This sheet is a summary. It may not cover all possible information. If you have questions about this medicine, talk to your doctor, pharmacist, or health care provider.  2024 Elsevier/Gold Standard (2021-10-11 00:00:00)

## 2023-02-28 LAB — CHROMOGRANIN A: Chromogranin A (ng/mL): 310.8 ng/mL — ABNORMAL HIGH (ref 0.0–101.8)

## 2023-03-27 ENCOUNTER — Inpatient Hospital Stay: Payer: Federal, State, Local not specified - PPO | Attending: Nurse Practitioner

## 2023-03-27 VITALS — BP 119/84 | HR 57 | Temp 97.8°F | Resp 18 | Ht 71.0 in | Wt 201.4 lb

## 2023-03-27 DIAGNOSIS — C7B04 Secondary carcinoid tumors of peritoneum: Secondary | ICD-10-CM | POA: Diagnosis not present

## 2023-03-27 DIAGNOSIS — C7B02 Secondary carcinoid tumors of liver: Secondary | ICD-10-CM | POA: Diagnosis not present

## 2023-03-27 DIAGNOSIS — C7A098 Malignant carcinoid tumors of other sites: Secondary | ICD-10-CM | POA: Diagnosis not present

## 2023-03-27 DIAGNOSIS — D3A098 Benign carcinoid tumors of other sites: Secondary | ICD-10-CM

## 2023-03-27 MED ORDER — LANREOTIDE ACETATE 120 MG/0.5ML ~~LOC~~ SOLN
120.0000 mg | Freq: Once | SUBCUTANEOUS | Status: AC
  Administered 2023-03-27: 120 mg via SUBCUTANEOUS

## 2023-03-27 NOTE — Patient Instructions (Signed)
Lanreotide Injection What is this medication? LANREOTIDE (lan REE oh tide) treats high levels of growth hormone (acromegaly). It is used when other therapies have not worked well enough or cannot be tolerated. It works by reducing the amount of growth hormone your body makes. This reduces symptoms and the risk of health problems caused by too much growth hormone, such as diabetes and heart disease. It may also be used to treat neuroendocrine tumors, a cancer of the cells that release hormones and other substances in your body. It works by slowing down the release of these substances from the cells. This slows tumor growth. It also decreases the symptoms of carcinoid syndrome, such as flushing or diarrhea. This medicine may be used for other purposes; ask your health care provider or pharmacist if you have questions. COMMON BRAND NAME(S): Somatuline Depot What should I tell my care team before I take this medication? They need to know if you have any of these conditions: Diabetes Gallbladder disease Heart disease Kidney disease Liver disease Thyroid disease An unusual or allergic reaction to lanreotide, other medications, foods, dyes, or preservatives Pregnant or trying to get pregnant Breast-feeding How should I use this medication? This medication is injected under the skin. It is given by your care team in a hospital or clinic setting. Talk to your care team about the use of this medication in children. Special care may be needed. Overdosage: If you think you have taken too much of this medicine contact a poison control center or emergency room at once. NOTE: This medicine is only for you. Do not share this medicine with others. What if I miss a dose? Keep appointments for follow-up doses. It is important not to miss your dose. Call your care team if you are unable to keep an appointment. What may interact with this medication? Bromocriptine Cyclosporine Certain medications for blood  pressure, heart disease, irregular heartbeat Certain medications for diabetes Quinidine Terfenadine This list may not describe all possible interactions. Give your health care provider a list of all the medicines, herbs, non-prescription drugs, or dietary supplements you use. Also tell them if you smoke, drink alcohol, or use illegal drugs. Some items may interact with your medicine. What should I watch for while using this medication? Visit your care team for regular checks on your progress. Tell your care team if your symptoms do not start to get better or if they get worse. Your condition will be monitored carefully while you are receiving this medication. You may need blood work while you are taking this medication. This medication may increase blood sugar. The risk may be higher in patients who already have diabetes. Ask your care team what you can do to lower your risk of diabetes while taking this medication. Talk to your care team if you wish to become pregnant or think you may be pregnant. This medication can cause serious birth defects. Do not breast-feed while taking this medication and for 6 months after stopping therapy. This medication may cause infertility. Talk to your care team if you are concerned about your fertility. What side effects may I notice from receiving this medication? Side effects that you should report to your care team as soon as possible: Allergic reactions--skin rash, itching, hives, swelling of the face, lips, tongue, or throat Gallbladder problems--severe stomach pain, nausea, vomiting, fever High blood sugar (hyperglycemia)--increased thirst or amount of urine, unusual weakness or fatigue, blurry vision Increase in blood pressure Low blood sugar (hypoglycemia)--tremors or shaking, anxiety, sweating, cold   or clammy skin, confusion, dizziness, rapid heartbeat Low thyroid levels (hypothyroidism)--unusual weakness or fatigue, increased sensitivity to cold,  constipation, hair loss, dry skin, weight gain, feelings of depression Slow heartbeat--dizziness, feeling faint or lightheaded, confusion, trouble breathing, unusual weakness or fatigue Side effects that usually do not require medical attention (report to your care team if they continue or are bothersome): Diarrhea Dizziness Headache Muscle spasms Nausea Pain, redness, irritation, or bruising at the injection site Stomach pain This list may not describe all possible side effects. Call your doctor for medical advice about side effects. You may report side effects to FDA at 1-800-FDA-1088. Where should I keep my medication? This medication is given in a hospital or clinic. It will not be stored at home. NOTE: This sheet is a summary. It may not cover all possible information. If you have questions about this medicine, talk to your doctor, pharmacist, or health care provider.  2024 Elsevier/Gold Standard (2021-10-11 00:00:00)  

## 2023-04-10 DIAGNOSIS — Z23 Encounter for immunization: Secondary | ICD-10-CM | POA: Diagnosis not present

## 2023-04-10 DIAGNOSIS — C7A8 Other malignant neuroendocrine tumors: Secondary | ICD-10-CM | POA: Diagnosis not present

## 2023-04-10 DIAGNOSIS — N1831 Chronic kidney disease, stage 3a: Secondary | ICD-10-CM | POA: Diagnosis not present

## 2023-04-10 DIAGNOSIS — Z0000 Encounter for general adult medical examination without abnormal findings: Secondary | ICD-10-CM | POA: Diagnosis not present

## 2023-04-10 DIAGNOSIS — E039 Hypothyroidism, unspecified: Secondary | ICD-10-CM | POA: Diagnosis not present

## 2023-04-15 DIAGNOSIS — R946 Abnormal results of thyroid function studies: Secondary | ICD-10-CM | POA: Diagnosis not present

## 2023-04-16 ENCOUNTER — Ambulatory Visit (HOSPITAL_BASED_OUTPATIENT_CLINIC_OR_DEPARTMENT_OTHER)
Admission: RE | Admit: 2023-04-16 | Discharge: 2023-04-16 | Disposition: A | Discharge status: Home / Self Care | Payer: Federal, State, Local not specified - PPO | Source: Ambulatory Visit | Attending: Oncology | Admitting: Oncology

## 2023-04-16 DIAGNOSIS — R911 Solitary pulmonary nodule: Secondary | ICD-10-CM | POA: Diagnosis not present

## 2023-04-16 DIAGNOSIS — I700 Atherosclerosis of aorta: Secondary | ICD-10-CM | POA: Diagnosis not present

## 2023-04-16 DIAGNOSIS — D3A098 Benign carcinoid tumors of other sites: Secondary | ICD-10-CM | POA: Diagnosis not present

## 2023-04-16 DIAGNOSIS — C7A8 Other malignant neuroendocrine tumors: Secondary | ICD-10-CM | POA: Diagnosis not present

## 2023-04-24 ENCOUNTER — Telehealth: Payer: Self-pay | Admitting: *Deleted

## 2023-04-24 ENCOUNTER — Inpatient Hospital Stay
Admission: RE | Admit: 2023-04-24 | Discharge: 2023-04-24 | Disposition: A | Discharge status: Home / Self Care | Payer: Self-pay | Source: Ambulatory Visit | Attending: Oncology | Admitting: Oncology

## 2023-04-24 ENCOUNTER — Inpatient Hospital Stay: Payer: Federal, State, Local not specified - PPO | Attending: Nurse Practitioner

## 2023-04-24 VITALS — BP 117/83 | HR 60 | Temp 97.8°F | Resp 17 | Ht 71.0 in | Wt 202.3 lb

## 2023-04-24 DIAGNOSIS — C7B02 Secondary carcinoid tumors of liver: Secondary | ICD-10-CM | POA: Diagnosis not present

## 2023-04-24 DIAGNOSIS — C7B04 Secondary carcinoid tumors of peritoneum: Secondary | ICD-10-CM | POA: Diagnosis not present

## 2023-04-24 DIAGNOSIS — D3A098 Benign carcinoid tumors of other sites: Secondary | ICD-10-CM

## 2023-04-24 DIAGNOSIS — C7A098 Malignant carcinoid tumors of other sites: Secondary | ICD-10-CM | POA: Insufficient documentation

## 2023-04-24 MED ORDER — LANREOTIDE ACETATE 120 MG/0.5ML ~~LOC~~ SOLN
120.0000 mg | Freq: Once | SUBCUTANEOUS | Status: AC
  Administered 2023-04-24: 120 mg via SUBCUTANEOUS
  Filled 2023-04-24: qty 120

## 2023-04-24 NOTE — Telephone Encounter (Signed)
Faxed CT chest report to Dr. Loreli Slot at [phone] with message that we are trying to get images sent to them. Dr. Truett Perna asking for images from PET from [facility] to allow comparison with CT Chest. [facility] reports they have been unsuccessful getting images to do a comparison. Called and spoke with triage w/Dr. [facility] requesting assistance in getting images pushed over to our system as well as getting images from here sent there. Do they powershare or need to mail CD? She will have nurse call back.

## 2023-04-24 NOTE — Patient Instructions (Signed)
 [place] [facility] - A DEPT OF [facility]  Discharge Instructions: Thank you for choosing [facility] to provide your oncology and hematology care.   If you have a lab appointment with the Cancer Center, please go directly to the Cancer Center and check in at the registration area.   Wear comfortable clothing and clothing appropriate for easy access to any Portacath or PICC line.   We strive to give you quality time with your provider. You may need to reschedule your appointment if you arrive late (15 or more minutes).  Arriving late affects you and other patients whose appointments are after yours.  Also, if you miss three or more appointments without notifying the office, you may be dismissed from the clinic at the provider's discretion.      For prescription refill requests, have your pharmacy contact our office and allow 72 hours for refills to be completed.    Today you received the following chemotherapy and/or immunotherapy agents Lanreotide      To help prevent nausea and vomiting after your treatment, we encourage you to take your nausea medication as directed.  BELOW ARE SYMPTOMS THAT SHOULD BE REPORTED IMMEDIATELY: *FEVER GREATER THAN 100.4 F (38 C) OR HIGHER *CHILLS OR SWEATING *NAUSEA AND VOMITING THAT IS NOT CONTROLLED WITH YOUR NAUSEA MEDICATION *UNUSUAL SHORTNESS OF BREATH *UNUSUAL BRUISING OR BLEEDING *URINARY PROBLEMS (pain or burning when urinating, or frequent urination) *BOWEL PROBLEMS (unusual diarrhea, constipation, pain near the anus) TENDERNESS IN MOUTH AND THROAT WITH OR WITHOUT PRESENCE OF ULCERS (sore throat, sores in mouth, or a toothache) UNUSUAL RASH, SWELLING OR PAIN  UNUSUAL VAGINAL DISCHARGE OR ITCHING   Items with * indicate a potential emergency and should be followed up as soon as possible or go to the Emergency Department if any problems should occur.  Please show the CHEMOTHERAPY ALERT CARD or IMMUNOTHERAPY  ALERT CARD at check-in to the Emergency Department and triage nurse.  Should you have questions after your visit or need to cancel or reschedule your appointment, please contact [place] [facility] - [facility]  Dept: [phone]  and follow the prompts.  Office hours are 8:00 a.m. to 4:30 p.m. Monday - Friday. Please note that voicemails left after 4:00 p.m. may not be returned until the following business day.  We are closed weekends and major holidays. You have access to a nurse at all times for urgent questions. Please call the main number to the clinic Dept: [phone] and follow the prompts.   For any non-urgent questions, you may also contact your provider using MyChart. We now offer e-Visits for anyone 35 and older to request care online for non-urgent symptoms. For details visit [email or web address].   Also download the MyChart app! Go to the app store, search "MyChart", open the app, select [place], and log in with your MyChart username and password.  Lanreotide Injection What is this medication? LANREOTIDE (lan REE oh tide) treats high levels of growth hormone (acromegaly). It is used when other therapies have not worked well enough or cannot be tolerated. It works by reducing the amount of growth hormone your body makes. This reduces symptoms and the risk of health problems caused by too much growth hormone, such as diabetes and heart disease. It may also be used to treat neuroendocrine tumors, a cancer of the cells that release hormones and other substances in your body. It works by slowing down the release  of these substances from the cells. This slows tumor growth. It also decreases the symptoms of carcinoid syndrome, such as flushing or diarrhea. This medicine may be used for other purposes; ask your health care provider or pharmacist if you have questions. COMMON BRAND NAME(S): Somatuline Depot What should I tell my care team before I take  this medication? They need to know if you have any of these conditions: Diabetes Gallbladder disease Heart disease Kidney disease Liver disease Thyroid disease An unusual or allergic reaction to lanreotide, other medications, foods, dyes, or preservatives Pregnant or trying to get pregnant Breast-feeding How should I use this medication? This medication is injected under the skin. It is given by your care team in a hospital or clinic setting. Talk to your care team about the use of this medication in children. Special care may be needed. Overdosage: If you think you have taken too much of this medicine contact a poison control center or emergency room at once. NOTE: This medicine is only for you. Do not share this medicine with others. What if I miss a dose? Keep appointments for follow-up doses. It is important not to miss your dose. Call your care team if you are unable to keep an appointment. What may interact with this medication? Bromocriptine Cyclosporine Certain medications for blood pressure, heart disease, irregular heartbeat Certain medications for diabetes Quinidine Terfenadine This list may not describe all possible interactions. Give your health care provider a list of all the medicines, herbs, non-prescription drugs, or dietary supplements you use. Also tell them if you smoke, drink alcohol, or use illegal drugs. Some items may interact with your medicine. What should I watch for while using this medication? Visit your care team for regular checks on your progress. Tell your care team if your symptoms do not start to get better or if they get worse. Your condition will be monitored carefully while you are receiving this medication. You may need blood work while you are taking this medication. This medication may increase blood sugar. The risk may be higher in patients who already have diabetes. Ask your care team what you can do to lower your risk of diabetes while taking  this medication. Talk to your care team if you wish to become pregnant or think you may be pregnant. This medication can cause serious birth defects. Do not breast-feed while taking this medication and for 6 months after stopping therapy. This medication may cause infertility. Talk to your care team if you are concerned about your fertility. What side effects may I notice from receiving this medication? Side effects that you should report to your care team as soon as possible: Allergic reactions--skin rash, itching, hives, swelling of the face, lips, tongue, or throat Gallbladder problems--severe stomach pain, nausea, vomiting, fever High blood sugar (hyperglycemia)--increased thirst or amount of urine, unusual weakness or fatigue, blurry vision Increase in blood pressure Low blood sugar (hypoglycemia)--tremors or shaking, anxiety, sweating, cold or clammy skin, confusion, dizziness, rapid heartbeat Low thyroid levels (hypothyroidism)--unusual weakness or fatigue, increased sensitivity to cold, constipation, hair loss, dry skin, weight gain, feelings of depression Slow heartbeat--dizziness, feeling faint or lightheaded, confusion, trouble breathing, unusual weakness or fatigue Side effects that usually do not require medical attention (report to your care team if they continue or are bothersome): Diarrhea Dizziness Headache Muscle spasms Nausea Pain, redness, irritation, or bruising at the injection site Stomach pain This list may not describe all possible side effects. Call your doctor for medical advice about side  effects. You may report side effects to FDA at 1-800-FDA-1088. Where should I keep my medication? This medication is given in a hospital or clinic. It will not be stored at home. NOTE: This sheet is a summary. It may not cover all possible information. If you have questions about this medicine, talk to your doctor, pharmacist, or health care provider.  2024 Elsevier/Gold  Standard (2021-10-11 00:00:00)

## 2023-04-24 NOTE — Telephone Encounter (Signed)
Made aware that mediastinal nodes are normal in size. Are awaiting Jon Gills PET images from May to come in for direct comparison. He is asking to be seen sooner than current appointment when this has been done. Expresses concern.

## 2023-04-24 NOTE — Telephone Encounter (Signed)
Confirmed with staff from Dr. Loreli Slot that they use [employer] and will send PET images to [facility] with [facility]. Notified [facility] to look for images that are being sent this afternoon. Need for radiology to do comparison w/CT chest and submit addendum. Informed them to please push over his CT chest to them via [facility] in [place], [place]

## 2023-04-24 NOTE — Telephone Encounter (Signed)
-----   Message from Thornton Papas sent at 04/23/2023  7:18 PM EST ----- Please call patient , small lymph nodes in mediastinum are normal in size.  Please request outside images and report for direct comparison and addendum report.  Outside PET report is in care everywhere.

## 2023-05-10 DIAGNOSIS — E039 Hypothyroidism, unspecified: Secondary | ICD-10-CM | POA: Diagnosis not present

## 2023-05-22 ENCOUNTER — Encounter: Payer: Self-pay | Admitting: *Deleted

## 2023-05-22 ENCOUNTER — Inpatient Hospital Stay: Payer: Federal, State, Local not specified - PPO

## 2023-05-22 ENCOUNTER — Inpatient Hospital Stay: Payer: Federal, State, Local not specified - PPO | Attending: Nurse Practitioner | Admitting: Oncology

## 2023-05-22 VITALS — BP 128/89 | HR 65 | Temp 98.1°F | Resp 18 | Ht 71.0 in | Wt 202.9 lb

## 2023-05-22 DIAGNOSIS — C7A098 Malignant carcinoid tumors of other sites: Secondary | ICD-10-CM | POA: Diagnosis not present

## 2023-05-22 DIAGNOSIS — C7B02 Secondary carcinoid tumors of liver: Secondary | ICD-10-CM | POA: Insufficient documentation

## 2023-05-22 DIAGNOSIS — D3A098 Benign carcinoid tumors of other sites: Secondary | ICD-10-CM

## 2023-05-22 DIAGNOSIS — C7B04 Secondary carcinoid tumors of peritoneum: Secondary | ICD-10-CM | POA: Diagnosis not present

## 2023-05-22 MED ORDER — LANREOTIDE ACETATE 120 MG/0.5ML ~~LOC~~ SOLN
120.0000 mg | Freq: Once | SUBCUTANEOUS | Status: AC
  Administered 2023-05-22: 120 mg via SUBCUTANEOUS
  Filled 2023-05-22: qty 120

## 2023-05-22 NOTE — Progress Notes (Signed)
Called to [facility] reading room requesting direct comparison of CT chest 04/16/23 to PET image imported from 10/28/22 and dictate an addendum per Dr. Kalman Drape request.

## 2023-05-22 NOTE — Progress Notes (Signed)
[facility] OFFICE PROGRESS NOTE   Diagnosis: Carcinoid tumor  INTERVAL HISTORY:   William Hancock returns as scheduled.  He feels well.  He is exercising.  No flushing, diarrhea, or pain. The thyroid hormone dose is being adjusted by William Hancock. Objective:  Vital signs in last 24 hours:  Blood pressure 128/89, pulse 65, temperature 98.1 F (36.7 C), temperature source Temporal, resp. rate 18, height 5\' 11"  (1.803 m), weight 202 lb 14.4 oz (92 kg), SpO2 100%.    Lymphatics: No cervical, supraclavicular, axillary, or inguinal nodes Resp: Lungs clear bilaterally Cardio: Regular rate and rhythm GI: No hepatosplenomegaly, no mass, nontender Vascular: No leg edema   Lab Results:  Lab Results  Component Value Date   WBC 5.1 01/31/2023   HGB 13.6 01/31/2023   HCT 38.1 (L) 01/31/2023   MCV 94.3 01/31/2023   PLT 155 01/31/2023   NEUTROABS 2.9 01/31/2023    CMP  Lab Results  Component Value Date   NA 140 01/31/2023   K 3.9 01/31/2023   CL 107 01/31/2023   CO2 24 01/31/2023   GLUCOSE 129 (H) 01/31/2023   BUN 20 01/31/2023   CREATININE 1.47 (H) 01/31/2023   CALCIUM 9.1 01/31/2023   PROT 7.3 01/31/2023   ALBUMIN 4.6 01/31/2023   AST 29 01/31/2023   ALT 30 01/31/2023   ALKPHOS 107 01/31/2023   BILITOT 1.7 (H) 01/31/2023   GFRNONAA 55 (L) 01/31/2023   GFRAA >60 02/26/2020    No results found for: "CEA1", "CEA", "UXL244", "CA125"  Lab Results  Component Value Date   INR 1.0 11/13/2018   LABPROT 13.0 11/13/2018    Imaging:  No results found.  Medications: I have reviewed the patient's current medications.   Assessment/Plan: Metastatic neuroendocrine tumor, well-differentiated 10/31/2018 Abdominal ultrasound-numerous nearly confluent heterogeneous masses measuring up to 3.5 cm.   11/04/2018 CT abdomen/pelvis-multiple enhancing hepatic lesions and a central partially calcified spiculated mesenteric mass measuring 3.1 x 3.1 cm.   11/13/2018 biopsy liver  lesion-metastatic well-differentiated neuroendocrine tumor positive for CKAE1-AE3, synaptophysin, chromogranin, CD56 and CDX-2; Ki-67 labeling index less than 3%; TTF-1 negative Netspot 12/12/2018- increased activity associated a lobular mesenteric mass, small bowel medial to the mesenteric mass, and multiple liver lesions Elevated chromogranin A level and 24-hour urine 5 HIAA Monthly lanreotide beginning 12/23/2018 CT 05/13/2019-multiple large hepatic metastases on the dotatate are poorly visualized, several small lesions are decreased in size 1 new lesion, stable mesenteric mass Netspot 09/04/2019-increase in size of left and right liver lesions, stable to slightly decreased radiotracer accumulation, stable central mesenteric mass, no new metastatic lesions Lutathera 10/13/2019 Lutathera 12/04/2019 Lutathera 01/29/2020 Lutathera 03/25/2020 Restaging Netspot 04/22/2020-overall positive measurable response to therapy.  Multiple hepatic metastasis continue to demonstrate intense radiotracer activity, the activity is consistently decreased from preradiotherapy scan.  No evidence of progressive disease in the liver.  Central mesenteric metastasis and adjacent small bowel lesion have very similar metabolic activity and no change in size.  No evidence of new peritoneal metastasis or skeletal metastasis. Monthly lanreotide continued Netspot 03/02/2021-stable radiotracer avid confluent left and right liver lesions, stable central mesenteric nodal metastasis, no new sites of metastatic disease Monthly lanreotide continued MRI abdomen 05/22/2021-innumerable confluent contrast-enhancing liver lesions-not significantly changed compared to the PET/CT, stable mesenteric metastasis 07/19/2021-bland embolization of right hepatic metastases with micro particles in [place] 08/16/2021-MRI abdomen without contrast-innumerable hepatic masses, decreased in size and overall bulk, especially in the right hepatic segments 5-8.   Stable central mesenteric mass 11/20/2021-dotatate PET-confluent radiotracer uptake at  the right hepatic dome, elongated area of radiotracer uptake at the anterior superior left hepatic lobe, small focus of uptake at the caudate lobe, radiotracer uptake associated with a partially calcified mesenteric mass and adjacent small bowel loop, no new sites of metastatic disease 11/21/2021-bland bleed/particle embolization of the left hepatic artery 01/24/2022-small bowel resection with anastomosis, cholecystectomy and hepatic metastasectomy (gallbladder with chronic cholecystitis with cholelithiasis; mesenteric lymph nodes with 2 of 22 lymph nodes positive for metastatic well differentiated neuroendocrine tumor; small bowel resection with well-differentiated neuroendocrine tumor, 2 cm, WHO grade 2, Ki-67 index approximately 3%, tumor extends from the mucosa to the serosa, mesenteric tumor deposit with necrosis and calcifications, 6 cm in greatest diameter (Ki-67 index approximately 8%), lymphovascular invasion present, perineural invasion present, margins negative for tumor; additional segment small bowel resection-benign small bowel with normal villous architecture and no significant histopathologic change, negative for tumor; liver segment 2 resection with metastatic well differentiated neuroendocrine tumor with extensive necrosis, 2.5 cm in greatest diameter; liver additional segment 2 resection metastatic well-differentiated neuroendocrine tumor with extensive necrosis, 5.1 cm in greatest diameter ERCP 02/02/2022, stent placed (report could not be located in care everywhere) 02/02/2022 exploratory laparotomy with small bowel resection-gangrenous necrosis with acute chronic inflammation; acute serositis with adhesions 02/04/2022 exploratory laparotomy with small bowel resection, appendectomy and small bowel-small bowel anastomosis-benign appendix with periappendiceal subacute and chronic hemorrhagic serosal reaction, no  evidence of carcinoid/neuroendocrine tumor Decreased chromogranin A 03/21/2022 MRI abdomen 05/23/2022-overall decrease in size of innumerable bilobar hepatic metastases, no new hepatic lesions, postsurgical changes with interval resection of the central mesenteric mass Dotatate PET 06/11/2022-decrease in number of radiotracer avid metastases in the liver, remaining metastatic lesions in the right liver, interval resection of central mesenteric mass with partial small bowel resection, no mesenteric or small bowel lesions present MRI abdomen 08/30/2022-innumerable by lobar hepatic metastases, mildly improved from 05/23/2022 MRI Dotatate PET 10/18/2022-compared to 11/20/2021, similar appearance of right hepatic lobe and caudate avid metastatic disease with exception of a new focus of the posterior right hepatic lobe, new SSR positive nonenlarged left paratracheal node Lanreotide continued MRI abdomen 01/28/2023-innumerable liver lesions, stable to slightly decreased in size.  No progressive disease Lanreotide continued CT chest 04/16/2023: Small mediastinal lymph nodes are not pathologically enlarged, slightly larger than images from January 2024, 7 mm groundglass right upper lobe nodule-indeterminate History of MI/stent placement CAD Hypertension Renal insufficiency Hypothyroidism diagnosed with markedly elevated TSH and low T4 on 12/18/2018 Acute left elbow/right ankle pain and swelling August 2021 February 2023-acute renal failure following liver embolization procedure, improved        Disposition: William Hancock appears stable.  He continues monthly lanreotide.  I reviewed the restaging chest CT findings and images with him.  He appears to have small mediastinal lymph nodes and a groundglass nodule in the right lung.  These are likely benign nonspecific findings.  It is possible the hypermetabolic node seen on the 10/18/2022 PET is a metastasis.  We will ask for a direct comparison to the 10/18/2022  images.  He will return for an office visit and chromogranin A in 4 months.  We will plan for a repeat chest CT in 6 months.  William Papas, MD  05/22/2023  9:37 AM

## 2023-05-23 LAB — CHROMOGRANIN A: Chromogranin A (ng/mL): 385 ng/mL — ABNORMAL HIGH (ref 0.0–101.8)

## 2023-06-07 DIAGNOSIS — E039 Hypothyroidism, unspecified: Secondary | ICD-10-CM | POA: Diagnosis not present

## 2023-06-11 DIAGNOSIS — E039 Hypothyroidism, unspecified: Secondary | ICD-10-CM | POA: Diagnosis not present

## 2023-06-11 DIAGNOSIS — I129 Hypertensive chronic kidney disease with stage 1 through stage 4 chronic kidney disease, or unspecified chronic kidney disease: Secondary | ICD-10-CM | POA: Diagnosis not present

## 2023-06-11 DIAGNOSIS — N182 Chronic kidney disease, stage 2 (mild): Secondary | ICD-10-CM | POA: Diagnosis not present

## 2023-06-12 ENCOUNTER — Encounter: Payer: Self-pay | Admitting: Oncology

## 2023-06-12 ENCOUNTER — Encounter: Payer: Self-pay | Admitting: Cardiology

## 2023-06-19 ENCOUNTER — Inpatient Hospital Stay: Payer: Federal, State, Local not specified - PPO | Attending: Nurse Practitioner

## 2023-06-19 VITALS — BP 127/86 | HR 58 | Temp 98.2°F | Resp 18

## 2023-06-19 DIAGNOSIS — C7B02 Secondary carcinoid tumors of liver: Secondary | ICD-10-CM | POA: Insufficient documentation

## 2023-06-19 DIAGNOSIS — C7B04 Secondary carcinoid tumors of peritoneum: Secondary | ICD-10-CM | POA: Insufficient documentation

## 2023-06-19 DIAGNOSIS — D3A098 Benign carcinoid tumors of other sites: Secondary | ICD-10-CM

## 2023-06-19 DIAGNOSIS — C7A098 Malignant carcinoid tumors of other sites: Secondary | ICD-10-CM | POA: Insufficient documentation

## 2023-06-19 MED ORDER — LANREOTIDE ACETATE 120 MG/0.5ML ~~LOC~~ SOLN
120.0000 mg | Freq: Once | SUBCUTANEOUS | Status: AC
Start: 2023-06-19 — End: 2023-06-19
  Administered 2023-06-19: 120 mg via SUBCUTANEOUS
  Filled 2023-06-19: qty 120

## 2023-06-19 NOTE — Patient Instructions (Signed)
 Lanreotide Injection What is this medication? LANREOTIDE (lan REE oh tide) treats high levels of growth hormone (acromegaly). It is used when other therapies have not worked well enough or cannot be tolerated. It works by reducing the amount of growth hormone your body makes. This reduces symptoms and the risk of health problems caused by too much growth hormone, such as diabetes and heart disease. It may also be used to treat neuroendocrine tumors, a cancer of the cells that release hormones and other substances in your body. It works by slowing down the release of these substances from the cells. This slows tumor growth. It also decreases the symptoms of carcinoid syndrome, such as flushing or diarrhea. This medicine may be used for other purposes; ask your health care provider or pharmacist if you have questions. COMMON BRAND NAME(S): Somatuline Depot What should I tell my care team before I take this medication? They need to know if you have any of these conditions: Diabetes Gallbladder disease Heart disease Kidney disease Liver disease Thyroid disease An unusual or allergic reaction to lanreotide, other medications, foods, dyes, or preservatives Pregnant or trying to get pregnant Breast-feeding How should I use this medication? This medication is injected under the skin. It is given by your care team in a hospital or clinic setting. Talk to your care team about the use of this medication in children. Special care may be needed. Overdosage: If you think you have taken too much of this medicine contact a poison control center or emergency room at once. NOTE: This medicine is only for you. Do not share this medicine with others. What if I miss a dose? Keep appointments for follow-up doses. It is important not to miss your dose. Call your care team if you are unable to keep an appointment. What may interact with this medication? Bromocriptine Cyclosporine Certain medications for blood  pressure, heart disease, irregular heartbeat Certain medications for diabetes Quinidine Terfenadine This list may not describe all possible interactions. Give your health care provider a list of all the medicines, herbs, non-prescription drugs, or dietary supplements you use. Also tell them if you smoke, drink alcohol, or use illegal drugs. Some items may interact with your medicine. What should I watch for while using this medication? Visit your care team for regular checks on your progress. Tell your care team if your symptoms do not start to get better or if they get worse. Your condition will be monitored carefully while you are receiving this medication. You may need blood work while you are taking this medication. This medication may increase blood sugar. The risk may be higher in patients who already have diabetes. Ask your care team what you can do to lower your risk of diabetes while taking this medication. Talk to your care team if you wish to become pregnant or think you may be pregnant. This medication can cause serious birth defects. Do not breast-feed while taking this medication and for 6 months after stopping therapy. This medication may cause infertility. Talk to your care team if you are concerned about your fertility. What side effects may I notice from receiving this medication? Side effects that you should report to your care team as soon as possible: Allergic reactions--skin rash, itching, hives, swelling of the face, lips, tongue, or throat Gallbladder problems--severe stomach pain, nausea, vomiting, fever High blood sugar (hyperglycemia)--increased thirst or amount of urine, unusual weakness or fatigue, blurry vision Increase in blood pressure Low blood sugar (hypoglycemia)--tremors or shaking, anxiety, sweating, cold  or clammy skin, confusion, dizziness, rapid heartbeat Low thyroid levels (hypothyroidism)--unusual weakness or fatigue, increased sensitivity to cold,  constipation, hair loss, dry skin, weight gain, feelings of depression Slow heartbeat--dizziness, feeling faint or lightheaded, confusion, trouble breathing, unusual weakness or fatigue Side effects that usually do not require medical attention (report to your care team if they continue or are bothersome): Diarrhea Dizziness Headache Muscle spasms Nausea Pain, redness, irritation, or bruising at the injection site Stomach pain This list may not describe all possible side effects. Call your doctor for medical advice about side effects. You may report side effects to FDA at 1-800-FDA-1088. Where should I keep my medication? This medication is given in a hospital or clinic. It will not be stored at home. NOTE: This sheet is a summary. It may not cover all possible information. If you have questions about this medicine, talk to your doctor, pharmacist, or health care provider.  2024 Elsevier/Gold Standard (2021-10-11 00:00:00)

## 2023-06-19 NOTE — Progress Notes (Signed)
 Patient ok receiving Lanreotide (by [employer]) today.  [name] [place], [place], BCPS, BCOP 06/19/2023 1:14 PM

## 2023-07-17 ENCOUNTER — Inpatient Hospital Stay: Payer: Federal, State, Local not specified - PPO | Attending: Nurse Practitioner

## 2023-07-17 VITALS — BP 128/84 | HR 68 | Temp 98.1°F | Resp 18 | Ht 71.0 in | Wt 205.7 lb

## 2023-07-17 DIAGNOSIS — C7A098 Malignant carcinoid tumors of other sites: Secondary | ICD-10-CM | POA: Diagnosis not present

## 2023-07-17 DIAGNOSIS — C7B02 Secondary carcinoid tumors of liver: Secondary | ICD-10-CM | POA: Diagnosis not present

## 2023-07-17 DIAGNOSIS — C7B04 Secondary carcinoid tumors of peritoneum: Secondary | ICD-10-CM | POA: Insufficient documentation

## 2023-07-17 DIAGNOSIS — D3A098 Benign carcinoid tumors of other sites: Secondary | ICD-10-CM

## 2023-07-17 MED ORDER — LANREOTIDE ACETATE 120 MG/0.5ML ~~LOC~~ SOLN
120.0000 mg | Freq: Once | SUBCUTANEOUS | Status: AC
  Administered 2023-07-17: 120 mg via SUBCUTANEOUS
  Filled 2023-07-17: qty 120

## 2023-07-17 NOTE — Patient Instructions (Signed)
[facility] A DEPT OF [facility]   Discharge Instructions: Thank you for choosing [facility] to provide your oncology and hematology care.   If you have a lab appointment with the Cancer Center, please go directly to the Cancer Center and check in at the registration area.   Wear comfortable clothing and clothing appropriate for easy access to any Portacath or PICC line.   We strive to give you quality time with your provider. You may need to reschedule your appointment if you arrive late (15 or more minutes).  Arriving late affects you and other patients whose appointments are after yours.  Also, if you miss three or more appointments without notifying the office, you may be dismissed from the clinic at the provider's discretion.      For prescription refill requests, have your pharmacy contact our office and allow 72 hours for refills to be completed.    Today you received the following chemotherapy and/or immunotherapy agents LANREOTIDE      To help prevent nausea and vomiting after your treatment, we encourage you to take your nausea medication as directed.  BELOW ARE SYMPTOMS THAT SHOULD BE REPORTED IMMEDIATELY: *FEVER GREATER THAN 100.4 F (38 C) OR HIGHER *CHILLS OR SWEATING *NAUSEA AND VOMITING THAT IS NOT CONTROLLED WITH YOUR NAUSEA MEDICATION *UNUSUAL SHORTNESS OF BREATH *UNUSUAL BRUISING OR BLEEDING *URINARY PROBLEMS (pain or burning when urinating, or frequent urination) *BOWEL PROBLEMS (unusual diarrhea, constipation, pain near the anus) TENDERNESS IN MOUTH AND THROAT WITH OR WITHOUT PRESENCE OF ULCERS (sore throat, sores in mouth, or a toothache) UNUSUAL RASH, SWELLING OR PAIN  UNUSUAL VAGINAL DISCHARGE OR ITCHING   Items with * indicate a potential emergency and should be followed up as soon as possible or go to the Emergency Department if any problems should occur.  Please show the CHEMOTHERAPY ALERT CARD or  IMMUNOTHERAPY ALERT CARD at check-in to the Emergency Department and triage nurse.  Should you have questions after your visit or need to cancel or reschedule your appointment, please contact [facility] - A DEPT OF [facility]  Dept: [phone]  and follow the prompts.  Office hours are 8:00 a.m. to 4:30 p.m. Monday - Friday. Please note that voicemails left after 4:00 p.m. may not be returned until the following business day.  We are closed weekends and major holidays. You have access to a nurse at all times for urgent questions. Please call the main number to the clinic Dept: [phone] and follow the prompts.   For any non-urgent questions, you may also contact your provider using MyChart. We now offer e-Visits for anyone 19 and older to request care online for non-urgent symptoms. For details visit [email or web address].   Also download the MyChart app! Go to the app store, search "MyChart", open the app, select [facility], and log in with your MyChart username and password.  Lanreotide Injection What is this medication? LANREOTIDE (lan REE oh tide) treats high levels of growth hormone (acromegaly). It is used when other therapies have not worked well enough or cannot be tolerated. It works by reducing the amount of growth hormone your body makes. This reduces symptoms and the risk of health problems caused by too much growth hormone, such as diabetes and heart disease. It may also be used to treat neuroendocrine tumors, a cancer of the cells that release hormones and other substances in your body. It works by slowing down the  release of these substances from the cells. This slows tumor growth. It also decreases the symptoms of carcinoid syndrome, such as flushing or diarrhea. This medicine may be used for other purposes; ask your health care provider or pharmacist if you have questions. COMMON BRAND NAME(S): Somatuline Depot What should I tell my care team  before I take this medication? They need to know if you have any of these conditions: Diabetes Gallbladder disease Heart disease Kidney disease Liver disease Pancreatic disease Thyroid disease An unusual or allergic reaction to lanreotide, other medications, foods, dyes, or preservatives Pregnant or trying to get pregnant Breastfeeding How should I use this medication? This medication is injected under the skin. It is given by your care team in a hospital or clinic setting. Talk to your care team about the use of this medication in children. Special care may be needed. Overdosage: If you think you have taken too much of this medicine contact a poison control center or emergency room at once. NOTE: This medicine is only for you. Do not share this medicine with others. What if I miss a dose? Keep appointments for follow-up doses. It is important not to miss your dose. Call your care team if you are unable to keep an appointment. What may interact with this medication? Bromocriptine Cyclosporine Certain medications for blood pressure, heart disease, irregular heartbeat Certain medications for diabetes Quinidine Terfenadine This list may not describe all possible interactions. Give your health care provider a list of all the medicines, herbs, non-prescription drugs, or dietary supplements you use. Also tell them if you smoke, drink alcohol, or use illegal drugs. Some items may interact with your medicine. What should I watch for while using this medication? Visit your care team for regular checks on your progress. Tell your care team if your symptoms do not start to get better or if they get worse. Your condition will be monitored carefully while you are receiving this medication. You may need blood work while you are taking this medication. This medication may increase blood sugar. The risk may be higher in patients who already have diabetes. Ask your care team what you can do to lower  your risk of diabetes while taking this medication. Talk to your care team if you wish to become pregnant or think you may be pregnant. This medication can cause serious birth defects. Do not breast-feed while taking this medication and for 6 months after stopping therapy. This medication may cause infertility. Talk to your care team if you are concerned about your fertility. What side effects may I notice from receiving this medication? Side effects that you should report to your care team as soon as possible: Allergic reactions--skin rash, itching, hives, swelling of the face, lips, tongue, or throat Gallbladder problems--severe stomach pain, nausea, vomiting, fever High blood sugar (hyperglycemia)--increased thirst or amount of urine, unusual weakness or fatigue, blurry vision Increase in blood pressure Low blood sugar (hypoglycemia)--pale, blue or purple skin or lips, sweating, fussiness, rapid heartbeat, poor feeding, low body temperature Low thyroid levels (hypothyroidism)--unusual weakness or fatigue, increased sensitivity to cold, constipation, hair loss, dry skin, weight gain, feelings of depression Oily or light-colored stools, diarrhea, bloating, weight loss Slow heartbeat--dizziness, feeling faint or lightheaded, confusion, trouble breathing, unusual weakness or fatigue Side effects that usually do not require medical attention (report these to your care team if they continue or are bothersome): Diarrhea Dizziness Headache Muscle spasms Nausea Pain, redness, or irritation at injection site Stomach pain This list may not  describe all possible side effects. Call your doctor for medical advice about side effects. You may report side effects to FDA at 1-800-FDA-1088. Where should I keep my medication? This medication is given in a hospital or clinic. It will not be stored at home. NOTE: This sheet is a summary. It may not cover all possible information. If you have questions about  this medicine, talk to your doctor, pharmacist, or health care provider.  2024 Elsevier/Gold Standard (2023-05-03 00:00:00)

## 2023-07-23 DIAGNOSIS — E039 Hypothyroidism, unspecified: Secondary | ICD-10-CM | POA: Diagnosis not present

## 2023-07-23 DIAGNOSIS — E349 Endocrine disorder, unspecified: Secondary | ICD-10-CM | POA: Diagnosis not present

## 2023-07-24 ENCOUNTER — Ambulatory Visit: Payer: Federal, State, Local not specified - PPO | Admitting: Cardiology

## 2023-07-26 NOTE — Progress Notes (Signed)
 Cardiology Office Note:  .   Date:  07/26/2023  ID:  [name], DOB June 25, 1965, MRN [id number] PCP: Renford Dills, MD  [facility] Providers Cardiologist:  Truett Mainland, MD PCP: Renford Dills, MD  Chief Complaint  Patient presents with   Coronary Artery Disease      History of Present Illness: .    William Hancock is a 58 y.o. male with hypertension, hyperlipidemia, CAD, metastatic neuroendocrine tumor, now s/p potentially curative surgery   Patient is staying active with regular workouts at the gym, and denies any exertional chest pain.  However, he feels generalized fatigue and malaise.  He does report mild exertional dyspnea, but denies any orthopnea, PND.  Recently, his TSH was found to be upper limit normal, low testosterone was found to be slightly low.  Reviewed recent CT chest from 05/2023, that showed expected findings of coronary calcification and aortic atherosclerosis.   Vitals:   07/30/23 1022  BP: 118/76  Pulse: 63  SpO2: 97%     ROS:  Review of Systems  Constitutional: Positive for malaise/fatigue.  Cardiovascular:  Positive for dyspnea on exertion. Negative for chest pain, leg swelling, palpitations and syncope.     Studies Reviewed: Marland Kitchen        EKG 07/30/2023: Normal sinus rhythm Normal ECG When compared with ECG of 10-Jun-2018 02:31, Incomplete right bundle branch block is no longer Present Criteria for Anterior infarct are no longer Present  Independently interpreted 01/2023: Chol 70, TG 99, HDL 36, LDL 15 Hb 13.6 Cr 9.62, eGFR 55  Coronary intervention 06/09/2018: LM: Normal  LAD: Prox to mid LAD/Diag1 bifurcation severe calcific 80% stenosis        Diag 1 mid 75% stenosis        Atherectomy and prox LAD into Diag         Synergy DES 2.75 X 16 mm DES        Overlapping Stent into prox LAD Synergy DES 3.0 X 38 mm        Provisional stent mid LAD with minicrush technique Synergy DES 2.75 X 16 mm DES Ramus: 50 % proximal  disease LCx: Mild luminal irregularities RCA: Stenting performed 06/06/2018        Severe mid 99% stenosis--->Orsero 3.5 X 26 mm Orsero DES         Post dilatation with 4.0 mm [place] balloon        Severe prox 80% stenosis--->Orsero 3.5 X 9 mm DES        Post dilatation with 4.0 mm [place] balloon   Recommendation: DAPT with Aspirin and brilinta for 1 year Aggressive risk factor modification    Physical Exam:   Physical Exam Vitals and nursing note reviewed.  Constitutional:      General: He is not in acute distress. Neck:     Vascular: No JVD.  Cardiovascular:     Rate and Rhythm: Normal rate and regular rhythm.     Heart sounds: Normal heart sounds. No murmur heard. Pulmonary:     Effort: Pulmonary effort is normal.     Breath sounds: Normal breath sounds. No wheezing or rales.  Musculoskeletal:     Right lower leg: No edema.     Left lower leg: No edema.      VISIT DIAGNOSES:   ICD-10-CM   1. Coronary artery disease involving native coronary artery of native heart without angina pectoris  I25.10 EKG 12-Lead    2. Essential hypertension  I10 EKG 12-Lead  3. Mixed hyperlipidemia  E78.2 EKG 12-Lead       ASSESSMENT AND PLAN: .    William Hancock is a 58 y.o. male with hypertension, hyperlipidemia, coronary artery disease, s/p multivessel complex PCI (pro & mid RCA, prox-mid LAD/Diag bifurcation) for NSTEMI in 06/2018, metastatic neuroendocrine tumor, now s/p potentially curative surgery   CAD: S/p multilvessel PCI for NSTEMI 06/2018 No angina symptoms, but has mild exertional dyspnea and generalized fatigue. Recommend exercise nuclear stress test and echocardiogram. Continue aspirin 81 mg daily.   Continue amlodipine 10 mg daily. Continue lipitor 40 mg daily.  Chol 70, TG 99, HDL 36, LDL 15 (01/2023).   If echocardiogram and exercise nuclear stress test are reassuring, could consider discussion with endocrinology regarding management of low testosterone, as possible  cause of his fatigue and malaise.  Hypertension: Very well controlled on labetalol 200 mg twice daily, amlodipine 10 mg daily, losartan 100 mg daily.  Refilled the same.   Metastatic neuroendocrine tumor: Now s/p surgery. Continue f/u w/Dr. Truett Perna.   Informed Consent   Shared Decision Making/Informed Consent The risks [chest pain, shortness of breath, cardiac arrhythmias, dizziness, blood pressure fluctuations, myocardial infarction, stroke/transient ischemic attack, nausea, vomiting, allergic reaction, radiation exposure, metallic taste sensation and life-threatening complications (estimated to be 1 in 10,000)], benefits (risk stratification, diagnosing coronary artery disease, treatment guidance) and alternatives of a nuclear stress test were discussed in detail with William Hancock and he agrees to proceed.       Meds ordered this encounter  Medications   nitroGLYCERIN (NITROSTAT) 0.4 MG SL tablet    Sig: Place 1 tablet (0.4 mg total) under the tongue every 5 (five) minutes x 3 doses as needed for chest pain.    Dispense:  25 tablet    Refill:  3   losartan (COZAAR) 100 MG tablet    Sig: Take 1 tablet (100 mg total) by mouth daily.    Dispense:  90 tablet    Refill:  3   labetalol (NORMODYNE) 200 MG tablet    Sig: Take 1 tablet (200 mg total) by mouth 2 (two) times daily.    Dispense:  180 tablet    Refill:  3   atorvastatin (LIPITOR) 40 MG tablet    Sig: Take 1 tablet (40 mg total) by mouth daily.    Dispense:  90 tablet    Refill:  3   aspirin (ASPIRIN LOW DOSE) 81 MG chewable tablet    Sig: Chew 1 tablet (81 mg total) by mouth daily.    Dispense:  90 tablet    Refill:  3   amLODipine (NORVASC) 10 MG tablet    Sig: Take 1 tablet (10 mg total) by mouth daily.    Dispense:  90 tablet    Refill:  3     F/u in 6 months  Signed, Elder Negus, MD

## 2023-07-30 ENCOUNTER — Ambulatory Visit: Payer: Federal, State, Local not specified - PPO | Attending: Cardiology | Admitting: Cardiology

## 2023-07-30 ENCOUNTER — Encounter: Payer: Self-pay | Admitting: *Deleted

## 2023-07-30 ENCOUNTER — Encounter: Payer: Self-pay | Admitting: Cardiology

## 2023-07-30 VITALS — BP 118/76 | HR 63 | Ht 71.0 in | Wt 199.6 lb

## 2023-07-30 DIAGNOSIS — E782 Mixed hyperlipidemia: Secondary | ICD-10-CM

## 2023-07-30 DIAGNOSIS — I10 Essential (primary) hypertension: Secondary | ICD-10-CM

## 2023-07-30 DIAGNOSIS — I2510 Atherosclerotic heart disease of native coronary artery without angina pectoris: Secondary | ICD-10-CM | POA: Diagnosis not present

## 2023-07-30 MED ORDER — NITROGLYCERIN 0.4 MG SL SUBL: 0.4000 mg | SUBLINGUAL_TABLET | SUBLINGUAL | 3 refills | Status: DC | PRN

## 2023-07-30 MED ORDER — ATORVASTATIN CALCIUM 40 MG PO TABS: 40.0000 mg | ORAL_TABLET | Freq: Every day | ORAL | 3 refills | Status: DC

## 2023-07-30 MED ORDER — AMLODIPINE BESYLATE 10 MG PO TABS
10.0000 mg | ORAL_TABLET | Freq: Every day | ORAL | 3 refills | Status: DC
Start: 2023-07-30 — End: 2024-01-15

## 2023-07-30 MED ORDER — LOSARTAN POTASSIUM 100 MG PO TABS: 100.0000 mg | ORAL_TABLET | Freq: Every day | ORAL | 3 refills | Status: DC

## 2023-07-30 MED ORDER — ASPIRIN 81 MG PO CHEW: 81.0000 mg | CHEWABLE_TABLET | Freq: Every day | ORAL | 3 refills | Status: DC

## 2023-07-30 MED ORDER — LABETALOL HCL 200 MG PO TABS: 200.0000 mg | ORAL_TABLET | Freq: Two times a day (BID) | ORAL | 3 refills | Status: DC

## 2023-07-30 NOTE — Patient Instructions (Signed)
 Medication Instructions:   Your physician recommends that you continue on your current medications as directed. Please refer to the Current Medication list given to you today.  *If you need a refill on your cardiac medications before your next appointment, please call your pharmacy*    Testing/Procedures:  Your physician has requested that you have an echocardiogram. Echocardiography is a painless test that uses sound waves to create images of your heart. It provides your doctor with information about the size and shape of your heart and how well your heart's chambers and valves are working. This procedure takes approximately one hour. There are no restrictions for this procedure. Please do NOT wear cologne, perfume, aftershave, or lotions (deodorant is allowed). Please arrive 15 minutes prior to your appointment time.  Please note: We ask at that you not bring children with you during ultrasound (echo/ vascular) testing. Due to room size and safety concerns, children are not allowed in the ultrasound rooms during exams. Our front office staff cannot provide observation of children in our lobby area while testing is being conducted. An adult accompanying a patient to their appointment will only be allowed in the ultrasound room at the discretion of the ultrasound technician under special circumstances. We apologize for any inconvenience.    Your physician has requested that you have en exercise stress myoview. For further information please visit [email or web address] Please follow instruction sheet, as given.     Follow-Up: At [facility], you and your health needs are our priority.  As part of our continuing mission to provide you with exceptional heart care, we have created designated Provider Care Teams.  These Care Teams include your primary Cardiologist (physician) and Advanced Practice Providers (APPs -  Physician Assistants and Nurse Practitioners) who all work together to  provide you with the care you need, when you need it.  We recommend signing up for the patient portal called "MyChart".  Sign up information is provided on this After Visit Summary.  MyChart is used to connect with patients for Virtual Visits (Telemedicine).  Patients are able to view lab/test results, encounter notes, upcoming appointments, etc.  Non-urgent messages can be sent to your provider as well.   To learn more about what you can do with MyChart, go to [email or web address].    Your next appointment:   6 month(s)  Provider:   DR. Rosemary Holms    Other Instructions    1st Floor: - Lobby - Registration  - Pharmacy  - Lab - Cafe  2nd Floor: - PV Lab - Diagnostic Testing (echo, CT, nuclear med)  3rd Floor: - Vacant  4th Floor: - TCTS (cardiothoracic surgery) - AFib Clinic - Structural Heart Clinic - Vascular Surgery  - Vascular Ultrasound  5th Floor: - HeartCare Cardiology (general and EP) - Clinical Pharmacy for coumadin, hypertension, lipid, weight-loss medications, and med management appointments    Valet parking services will be available as well.

## 2023-08-14 ENCOUNTER — Inpatient Hospital Stay: Payer: Federal, State, Local not specified - PPO | Attending: Nurse Practitioner

## 2023-08-14 ENCOUNTER — Encounter (HOSPITAL_COMMUNITY): Payer: Self-pay

## 2023-08-14 VITALS — BP 141/89 | HR 56 | Resp 18 | Ht 71.0 in | Wt 201.1 lb

## 2023-08-14 DIAGNOSIS — C7B02 Secondary carcinoid tumors of liver: Secondary | ICD-10-CM | POA: Diagnosis not present

## 2023-08-14 DIAGNOSIS — D3A098 Benign carcinoid tumors of other sites: Secondary | ICD-10-CM

## 2023-08-14 DIAGNOSIS — C7A098 Malignant carcinoid tumors of other sites: Secondary | ICD-10-CM | POA: Insufficient documentation

## 2023-08-14 DIAGNOSIS — C7B04 Secondary carcinoid tumors of peritoneum: Secondary | ICD-10-CM | POA: Diagnosis not present

## 2023-08-14 MED ORDER — LANREOTIDE ACETATE 120 MG/0.5ML ~~LOC~~ SOLN
120.0000 mg | Freq: Once | SUBCUTANEOUS | Status: AC
Start: 2023-08-14 — End: 2023-08-14
  Administered 2023-08-14: 120 mg via SUBCUTANEOUS
  Filled 2023-08-14: qty 120

## 2023-08-14 NOTE — Patient Instructions (Signed)
 Lanreotide Injection What is this medication? LANREOTIDE (lan REE oh tide) treats high levels of growth hormone (acromegaly). It is used when other therapies have not worked well enough or cannot be tolerated. It works by reducing the amount of growth hormone your body makes. This reduces symptoms and the risk of health problems caused by too much growth hormone, such as diabetes and heart disease. It may also be used to treat neuroendocrine tumors, a cancer of the cells that release hormones and other substances in your body. It works by slowing down the release of these substances from the cells. This slows tumor growth. It also decreases the symptoms of carcinoid syndrome, such as flushing or diarrhea. This medicine may be used for other purposes; ask your health care provider or pharmacist if you have questions. COMMON BRAND NAME(S): Somatuline [place] What should I tell my care team before I take this medication? They need to know if you have any of these conditions: Diabetes Gallbladder disease Heart disease Kidney disease Liver disease Pancreatic disease Thyroid disease An unusual or allergic reaction to lanreotide, other medications, foods, dyes, or preservatives Pregnant or trying to get pregnant Breastfeeding How should I use this medication? This medication is injected under the skin. It is given by your care team in a hospital or clinic setting. Talk to your care team about the use of this medication in children. Special care may be needed. Overdosage: If you think you have taken too much of this medicine contact a poison control center or emergency room at once. NOTE: This medicine is only for you. Do not share this medicine with others. What if I miss a dose? Keep appointments for follow-up doses. It is important not to miss your dose. Call your care team if you are unable to keep an appointment. What may interact with this medication? Bromocriptine Cyclosporine Certain  medications for blood pressure, heart disease, irregular heartbeat Certain medications for diabetes Quinidine Terfenadine This list may not describe all possible interactions. Give your health care provider a list of all the medicines, herbs, non-prescription drugs, or dietary supplements you use. Also tell them if you smoke, drink alcohol, or use illegal drugs. Some items may interact with your medicine. What should I watch for while using this medication? Visit your care team for regular checks on your progress. Tell your care team if your symptoms do not start to get better or if they get worse. Your condition will be monitored carefully while you are receiving this medication. You may need blood work while you are taking this medication. This medication may increase blood sugar. The risk may be higher in patients who already have diabetes. Ask your care team what you can do to lower your risk of diabetes while taking this medication. Talk to your care team if you wish to become pregnant or think you may be pregnant. This medication can cause serious birth defects. Do not breast-feed while taking this medication and for 6 months after stopping therapy. This medication may cause infertility. Talk to your care team if you are concerned about your fertility. What side effects may I notice from receiving this medication? Side effects that you should report to your care team as soon as possible: Allergic reactions--skin rash, itching, hives, swelling of the face, lips, tongue, or throat Gallbladder problems--severe stomach pain, nausea, vomiting, fever High blood sugar (hyperglycemia)--increased thirst or amount of urine, unusual weakness or fatigue, blurry vision Increase in blood pressure Low blood sugar (hypoglycemia)--pale, blue or purple  skin or lips, sweating, fussiness, rapid heartbeat, poor feeding, low body temperature Low thyroid levels (hypothyroidism)--unusual weakness or fatigue,  increased sensitivity to cold, constipation, hair loss, dry skin, weight gain, feelings of depression Oily or light-colored stools, diarrhea, bloating, weight loss Slow heartbeat--dizziness, feeling faint or lightheaded, confusion, trouble breathing, unusual weakness or fatigue Side effects that usually do not require medical attention (report these to your care team if they continue or are bothersome): Diarrhea Dizziness Headache Muscle spasms Nausea Pain, redness, or irritation at injection site Stomach pain This list may not describe all possible side effects. Call your doctor for medical advice about side effects. You may report side effects to FDA at 1-800-FDA-1088. Where should I keep my medication? This medication is given in a hospital or clinic. It will not be stored at home. NOTE: This sheet is a summary. It may not cover all possible information. If you have questions about this medicine, talk to your doctor, pharmacist, or health care provider.  2024 Elsevier/Gold Standard (2023-05-03 00:00:00)

## 2023-08-19 ENCOUNTER — Ambulatory Visit (HOSPITAL_COMMUNITY): Payer: Federal, State, Local not specified - PPO | Attending: Cardiology

## 2023-08-19 ENCOUNTER — Ambulatory Visit (HOSPITAL_BASED_OUTPATIENT_CLINIC_OR_DEPARTMENT_OTHER): Payer: Federal, State, Local not specified - PPO

## 2023-08-19 DIAGNOSIS — I2510 Atherosclerotic heart disease of native coronary artery without angina pectoris: Secondary | ICD-10-CM

## 2023-08-19 LAB — MYOCARDIAL PERFUSION IMAGING
LV dias vol: 76 mL (ref 62–150)
LV sys vol: 34 mL
Nuc Stress EF: 56 %
Rest Nuclear Isotope Dose: 10.1 mCi
SDS: 9
SRS: 2
SSS: 11
ST Depression (mm): 1 mm
Stress Nuclear Isotope Dose: 32.7 mCi
TID: 0.86

## 2023-08-19 LAB — ECHOCARDIOGRAM COMPLETE
Area-P 1/2: 3.31 cm2
Height: 71 in
S' Lateral: 2.4 cm
Weight: 3184 [oz_av]

## 2023-08-19 MED ORDER — TECHNETIUM TC 99M TETROFOSMIN IV KIT
10.1000 | PACK | Freq: Once | INTRAVENOUS | Status: AC | PRN
Start: 2023-08-19 — End: 2023-08-19
  Administered 2023-08-19: 10.1 via INTRAVENOUS

## 2023-08-19 MED ORDER — REGADENOSON 0.4 MG/5ML IV SOLN
0.4000 mg | Freq: Once | INTRAVENOUS | Status: AC
  Administered 2023-08-19: 0.4 mg via INTRAVENOUS

## 2023-08-19 MED ORDER — TECHNETIUM TC 99M TETROFOSMIN IV KIT
32.7000 | PACK | Freq: Once | INTRAVENOUS | Status: AC | PRN
  Administered 2023-08-19: 32.7 via INTRAVENOUS

## 2023-08-20 ENCOUNTER — Encounter: Payer: Self-pay | Admitting: Cardiology

## 2023-09-11 ENCOUNTER — Inpatient Hospital Stay: Payer: Federal, State, Local not specified - PPO

## 2023-09-11 ENCOUNTER — Inpatient Hospital Stay: Payer: Federal, State, Local not specified - PPO | Attending: Nurse Practitioner

## 2023-09-11 ENCOUNTER — Inpatient Hospital Stay (HOSPITAL_BASED_OUTPATIENT_CLINIC_OR_DEPARTMENT_OTHER): Payer: Federal, State, Local not specified - PPO | Admitting: Oncology

## 2023-09-11 VITALS — BP 128/86 | HR 69 | Temp 98.1°F | Resp 18 | Ht 71.0 in | Wt 205.5 lb

## 2023-09-11 DIAGNOSIS — C7B04 Secondary carcinoid tumors of peritoneum: Secondary | ICD-10-CM | POA: Insufficient documentation

## 2023-09-11 DIAGNOSIS — D3A098 Benign carcinoid tumors of other sites: Secondary | ICD-10-CM | POA: Diagnosis not present

## 2023-09-11 DIAGNOSIS — C7B02 Secondary carcinoid tumors of liver: Secondary | ICD-10-CM | POA: Diagnosis not present

## 2023-09-11 DIAGNOSIS — E039 Hypothyroidism, unspecified: Secondary | ICD-10-CM | POA: Insufficient documentation

## 2023-09-11 DIAGNOSIS — R197 Diarrhea, unspecified: Secondary | ICD-10-CM | POA: Insufficient documentation

## 2023-09-11 DIAGNOSIS — C7A098 Malignant carcinoid tumors of other sites: Secondary | ICD-10-CM | POA: Diagnosis not present

## 2023-09-11 LAB — CMP (CANCER CENTER ONLY)
ALT: 29 U/L (ref 0–44)
AST: 27 U/L (ref 15–41)
Albumin: 4.6 g/dL (ref 3.5–5.0)
Alkaline Phosphatase: 81 U/L (ref 38–126)
Anion gap: 10 (ref 5–15)
BUN: 19 mg/dL (ref 6–20)
CO2: 23 mmol/L (ref 22–32)
Calcium: 9.2 mg/dL (ref 8.9–10.3)
Chloride: 109 mmol/L (ref 98–111)
Creatinine: 1.43 mg/dL — ABNORMAL HIGH (ref 0.61–1.24)
GFR, Estimated: 56 mL/min — ABNORMAL LOW (ref >60)
Glucose, Bld: 100 mg/dL — ABNORMAL HIGH (ref 70–99)
Potassium: 3.7 mmol/L (ref 3.5–5.1)
Sodium: 142 mmol/L (ref 135–145)
Total Bilirubin: 1.6 mg/dL — ABNORMAL HIGH (ref 0.0–1.2)
Total Protein: 6.9 g/dL (ref 6.5–8.1)

## 2023-09-11 MED ORDER — LANREOTIDE ACETATE 120 MG/0.5ML ~~LOC~~ SOLN
120.0000 mg | Freq: Once | SUBCUTANEOUS | Status: AC
  Administered 2023-09-11: 120 mg via SUBCUTANEOUS
  Filled 2023-09-11: qty 120

## 2023-09-11 NOTE — Progress Notes (Signed)
 [facility] OFFICE PROGRESS NOTE   Diagnosis: Carcinoid  INTERVAL HISTORY:   William Hancock returns as scheduled.  He continues every 4-week lanreotide.  No flushing.  He has 3-4 loose stools per day.  This has been the pattern since he underwent small bowel surgery.  Good appetite and energy level.  Objective:  Vital signs in last 24 hours:  Blood pressure 128/86, pulse 69, temperature 98.1 F (36.7 C), temperature source Temporal, resp. rate 18, height 5\' 11"  (1.803 m), weight 205 lb 8 oz (93.2 kg), SpO2 100%.    Lymphatics: No cervical, supraclavicular, or axillary nodes Resp: Lungs clear bilaterally Cardio: Regular rate and rhythm GI: No hepatosplenomegaly, no mass, nontender Vascular: No leg edema  Portacath/PICC-without erythema  Lab Results:  Lab Results  Component Value Date   WBC 5.1 01/31/2023   HGB 13.6 01/31/2023   HCT 38.1 (L) 01/31/2023   MCV 94.3 01/31/2023   PLT 155 01/31/2023   NEUTROABS 2.9 01/31/2023    CMP  Lab Results  Component Value Date   NA 142 09/11/2023   K 3.7 09/11/2023   CL 109 09/11/2023   CO2 23 09/11/2023   GLUCOSE 100 (H) 09/11/2023   BUN 19 09/11/2023   CREATININE 1.43 (H) 09/11/2023   CALCIUM 9.2 09/11/2023   PROT 6.9 09/11/2023   ALBUMIN 4.6 09/11/2023   AST 27 09/11/2023   ALT 29 09/11/2023   ALKPHOS 81 09/11/2023   BILITOT 1.6 (H) 09/11/2023   GFRNONAA 56 (L) 09/11/2023   GFRAA >60 02/26/2020     Medications: I have reviewed the patient's current medications.   Assessment/Plan: Metastatic neuroendocrine tumor, well-differentiated 10/31/2018 Abdominal ultrasound-numerous nearly confluent heterogeneous masses measuring up to 3.5 cm.   11/04/2018 CT abdomen/pelvis-multiple enhancing hepatic lesions and a central partially calcified spiculated mesenteric mass measuring 3.1 x 3.1 cm.   11/13/2018 biopsy liver lesion-metastatic well-differentiated neuroendocrine tumor positive for CKAE1-AE3, synaptophysin,  chromogranin, CD56 and CDX-2; Ki-67 labeling index less than 3%; TTF-1 negative Netspot 12/12/2018- increased activity associated a lobular mesenteric mass, small bowel medial to the mesenteric mass, and multiple liver lesions Elevated chromogranin A level and 24-hour urine 5 HIAA Monthly lanreotide beginning 12/23/2018 CT 05/13/2019-multiple large hepatic metastases on the dotatate are poorly visualized, several small lesions are decreased in size 1 new lesion, stable mesenteric mass Netspot 09/04/2019-increase in size of left and right liver lesions, stable to slightly decreased radiotracer accumulation, stable central mesenteric mass, no new metastatic lesions Lutathera 10/13/2019 Lutathera 12/04/2019 Lutathera 01/29/2020 Lutathera 03/25/2020 Restaging Netspot 04/22/2020-overall positive measurable response to therapy.  Multiple hepatic metastasis continue to demonstrate intense radiotracer activity, the activity is consistently decreased from preradiotherapy scan.  No evidence of progressive disease in the liver.  Central mesenteric metastasis and adjacent small bowel lesion have very similar metabolic activity and no change in size.  No evidence of new peritoneal metastasis or skeletal metastasis. Monthly lanreotide continued Netspot 03/02/2021-stable radiotracer avid confluent left and right liver lesions, stable central mesenteric nodal metastasis, no new sites of metastatic disease Monthly lanreotide continued MRI abdomen 05/22/2021-innumerable confluent contrast-enhancing liver lesions-not significantly changed compared to the PET/CT, stable mesenteric metastasis 07/19/2021-bland embolization of right hepatic metastases with micro particles in [place] 08/16/2021-MRI abdomen without contrast-innumerable hepatic masses, decreased in size and overall bulk, especially in the right hepatic segments 5-8.  Stable central mesenteric mass 11/20/2021-dotatate PET-confluent radiotracer uptake at the right  hepatic dome, elongated area of radiotracer uptake at the anterior superior left hepatic lobe, small focus of uptake at  the caudate lobe, radiotracer uptake associated with a partially calcified mesenteric mass and adjacent small bowel loop, no new sites of metastatic disease 11/21/2021-bland bleed/particle embolization of the left hepatic artery 01/24/2022-small bowel resection with anastomosis, cholecystectomy and hepatic metastasectomy (gallbladder with chronic cholecystitis with cholelithiasis; mesenteric lymph nodes with 2 of 22 lymph nodes positive for metastatic well differentiated neuroendocrine tumor; small bowel resection with well-differentiated neuroendocrine tumor, 2 cm, WHO grade 2, Ki-67 index approximately 3%, tumor extends from the mucosa to the serosa, mesenteric tumor deposit with necrosis and calcifications, 6 cm in greatest diameter (Ki-67 index approximately 8%), lymphovascular invasion present, perineural invasion present, margins negative for tumor; additional segment small bowel resection-benign small bowel with normal villous architecture and no significant histopathologic change, negative for tumor; liver segment 2 resection with metastatic well differentiated neuroendocrine tumor with extensive necrosis, 2.5 cm in greatest diameter; liver additional segment 2 resection metastatic well-differentiated neuroendocrine tumor with extensive necrosis, 5.1 cm in greatest diameter ERCP 02/02/2022, stent placed (report could not be located in care everywhere) 02/02/2022 exploratory laparotomy with small bowel resection-gangrenous necrosis with acute chronic inflammation; acute serositis with adhesions 02/04/2022 exploratory laparotomy with small bowel resection, appendectomy and small bowel-small bowel anastomosis-benign appendix with periappendiceal subacute and chronic hemorrhagic serosal reaction, no evidence of carcinoid/neuroendocrine tumor Decreased chromogranin A 03/21/2022 MRI abdomen  05/23/2022-overall decrease in size of innumerable bilobar hepatic metastases, no new hepatic lesions, postsurgical changes with interval resection of the central mesenteric mass Dotatate PET 06/11/2022-decrease in number of radiotracer avid metastases in the liver, remaining metastatic lesions in the right liver, interval resection of central mesenteric mass with partial small bowel resection, no mesenteric or small bowel lesions present MRI abdomen 08/30/2022-innumerable by lobar hepatic metastases, mildly improved from 05/23/2022 MRI Dotatate PET 10/18/2022-compared to 11/20/2021, similar appearance of right hepatic lobe and caudate avid metastatic disease with exception of a new focus of the posterior right hepatic lobe, new SSR positive nonenlarged left paratracheal node Lanreotide continued MRI abdomen 01/28/2023-innumerable liver lesions, stable to slightly decreased in size.  No progressive disease Lanreotide continued CT chest 04/16/2023: Small mediastinal lymph nodes are not pathologically enlarged, slightly larger than images from January 2024, 7 mm groundglass right upper lobe nodule-indeterminate, left paratracheal hypermetabolic node on 1/61/0960 PET measures 6 mm compared to 5 mm in May History of MI/stent placement CAD Hypertension Renal insufficiency Hypothyroidism diagnosed with markedly elevated TSH and low T4 on 12/18/2018 Acute left elbow/right ankle pain and swelling August 2021 February 2023-acute renal failure following liver embolization procedure, improved         Disposition: Mr. Largo appears stable.  There is no clinical evidence for progression of the carcinoid tumor.  He continues monthly lanreotide.  We will follow-up on the chromogranin a level from today.  He will undergo a restaging dotatate scan in May to follow-up on the liver lesions, mediastinal lymph node, and 7 mm right upper lobe nodule-noted on the November 2024 CT.  He will be scheduled for an office  visit in 4 months.  He continues follow-up with Dr. Melynda Ripple- Maluccio and reports he has a telehealth visit scheduled for within the next few weeks.  William Papas, MD  09/11/2023  9:28 AM

## 2023-09-13 LAB — CHROMOGRANIN A: Chromogranin A (ng/mL): 460 ng/mL — ABNORMAL HIGH (ref 0.0–101.8)

## 2023-09-16 DIAGNOSIS — E039 Hypothyroidism, unspecified: Secondary | ICD-10-CM | POA: Diagnosis not present

## 2023-09-17 DIAGNOSIS — C7A8 Other malignant neuroendocrine tumors: Secondary | ICD-10-CM | POA: Diagnosis not present

## 2023-09-17 DIAGNOSIS — H6123 Impacted cerumen, bilateral: Secondary | ICD-10-CM | POA: Diagnosis not present

## 2023-09-23 ENCOUNTER — Encounter: Payer: Self-pay | Admitting: Oncology

## 2023-09-23 ENCOUNTER — Telehealth: Payer: Self-pay | Admitting: *Deleted

## 2023-09-23 NOTE — Telephone Encounter (Signed)
 Noted that his DOTATATE scan is 5/16 with Lanreotide injection on 5/7 and per policy this is too close to scan to administer. Asking if he agrees to move the scan out closer before the 6/4 injection or reschedule the 5/7 injection to after 5/16 or same day after the scan. Requested call or MyChart message.

## 2023-09-24 ENCOUNTER — Encounter: Payer: Self-pay | Admitting: *Deleted

## 2023-09-24 NOTE — Progress Notes (Signed)
 [name] has an appointment with Dr. Adriana Albany on 5/1. Confirmed with nurse, Jinnie Mountain that they have the MRI images from 01/28/23, but not the CT chest of 04/16/23. Requested w/radiology to push over these images to [name] in [place], [place].

## 2023-09-25 ENCOUNTER — Encounter: Payer: Self-pay | Admitting: Oncology

## 2023-10-02 ENCOUNTER — Ambulatory Visit (HOSPITAL_COMMUNITY)
Admission: RE | Admit: 2023-10-02 | Discharge: 2023-10-02 | Disposition: A | Discharge status: Home / Self Care | Source: Ambulatory Visit | Attending: Oncology | Admitting: Oncology

## 2023-10-02 DIAGNOSIS — D3A098 Benign carcinoid tumors of other sites: Secondary | ICD-10-CM | POA: Diagnosis not present

## 2023-10-02 DIAGNOSIS — C7B02 Secondary carcinoid tumors of liver: Secondary | ICD-10-CM | POA: Diagnosis not present

## 2023-10-02 DIAGNOSIS — R932 Abnormal findings on diagnostic imaging of liver and biliary tract: Secondary | ICD-10-CM | POA: Diagnosis not present

## 2023-10-02 DIAGNOSIS — C7A8 Other malignant neuroendocrine tumors: Secondary | ICD-10-CM | POA: Diagnosis not present

## 2023-10-02 MED ORDER — COPPER CU 64 DOTATATE 1 MCI/ML IV SOLN
4.0000 | Freq: Once | INTRAVENOUS | Status: AC
  Administered 2023-10-02: 4 via INTRAVENOUS

## 2023-10-03 ENCOUNTER — Encounter: Payer: Self-pay | Admitting: *Deleted

## 2023-10-09 ENCOUNTER — Inpatient Hospital Stay: Attending: Nurse Practitioner

## 2023-10-09 VITALS — BP 128/84 | HR 63 | Temp 98.2°F | Resp 14

## 2023-10-09 DIAGNOSIS — C7B02 Secondary carcinoid tumors of liver: Secondary | ICD-10-CM | POA: Diagnosis not present

## 2023-10-09 DIAGNOSIS — C7B04 Secondary carcinoid tumors of peritoneum: Secondary | ICD-10-CM | POA: Insufficient documentation

## 2023-10-09 DIAGNOSIS — C7A098 Malignant carcinoid tumors of other sites: Secondary | ICD-10-CM | POA: Diagnosis not present

## 2023-10-09 DIAGNOSIS — D3A098 Benign carcinoid tumors of other sites: Secondary | ICD-10-CM

## 2023-10-09 MED ORDER — LANREOTIDE ACETATE 120 MG/0.5ML ~~LOC~~ SOLN
120.0000 mg | Freq: Once | SUBCUTANEOUS | Status: AC
  Administered 2023-10-09: 120 mg via SUBCUTANEOUS

## 2023-10-17 ENCOUNTER — Encounter: Payer: Self-pay | Admitting: *Deleted

## 2023-10-18 ENCOUNTER — Encounter (HOSPITAL_COMMUNITY)

## 2023-11-06 ENCOUNTER — Inpatient Hospital Stay: Attending: Nurse Practitioner

## 2023-11-06 VITALS — BP 115/77 | HR 68 | Temp 98.3°F | Resp 18

## 2023-11-06 DIAGNOSIS — C7B02 Secondary carcinoid tumors of liver: Secondary | ICD-10-CM | POA: Diagnosis not present

## 2023-11-06 DIAGNOSIS — D3A098 Benign carcinoid tumors of other sites: Secondary | ICD-10-CM

## 2023-11-06 DIAGNOSIS — C7B04 Secondary carcinoid tumors of peritoneum: Secondary | ICD-10-CM | POA: Insufficient documentation

## 2023-11-06 DIAGNOSIS — R197 Diarrhea, unspecified: Secondary | ICD-10-CM | POA: Insufficient documentation

## 2023-11-06 DIAGNOSIS — C7A098 Malignant carcinoid tumors of other sites: Secondary | ICD-10-CM | POA: Insufficient documentation

## 2023-11-06 MED ORDER — LANREOTIDE ACETATE 120 MG/0.5ML ~~LOC~~ SOLN
120.0000 mg | Freq: Once | SUBCUTANEOUS | Status: AC
  Administered 2023-11-06: 120 mg via SUBCUTANEOUS

## 2023-11-06 NOTE — Patient Instructions (Signed)
 Lanreotide Injection What is this medication? LANREOTIDE (lan REE oh tide) treats high levels of growth hormone (acromegaly). It is used when other therapies have not worked well enough or cannot be tolerated. It works by reducing the amount of growth hormone your body makes. This reduces symptoms and the risk of health problems caused by too much growth hormone, such as diabetes and heart disease. It may also be used to treat neuroendocrine tumors, a cancer of the cells that release hormones and other substances in your body. It works by slowing down the release of these substances from the cells. This slows tumor growth. It also decreases the symptoms of carcinoid syndrome, such as flushing or diarrhea. This medicine may be used for other purposes; ask your health care provider or pharmacist if you have questions. COMMON BRAND NAME(S): Somatuline [place] What should I tell my care team before I take this medication? They need to know if you have any of these conditions: Diabetes Gallbladder disease Heart disease Kidney disease Liver disease Pancreatic disease Thyroid disease An unusual or allergic reaction to lanreotide, other medications, foods, dyes, or preservatives Pregnant or trying to get pregnant Breastfeeding How should I use this medication? This medication is injected under the skin. It is given by your care team in a hospital or clinic setting. Talk to your care team about the use of this medication in children. Special care may be needed. Overdosage: If you think you have taken too much of this medicine contact a poison control center or emergency room at once. NOTE: This medicine is only for you. Do not share this medicine with others. What if I miss a dose? Keep appointments for follow-up doses. It is important not to miss your dose. Call your care team if you are unable to keep an appointment. What may interact with this medication? Bromocriptine Cyclosporine Certain  medications for blood pressure, heart disease, irregular heartbeat Certain medications for diabetes Quinidine Terfenadine This list may not describe all possible interactions. Give your health care provider a list of all the medicines, herbs, non-prescription drugs, or dietary supplements you use. Also tell them if you smoke, drink alcohol, or use illegal drugs. Some items may interact with your medicine. What should I watch for while using this medication? Visit your care team for regular checks on your progress. Tell your care team if your symptoms do not start to get better or if they get worse. Your condition will be monitored carefully while you are receiving this medication. You may need blood work while you are taking this medication. This medication may increase blood sugar. The risk may be higher in patients who already have diabetes. Ask your care team what you can do to lower your risk of diabetes while taking this medication. Talk to your care team if you wish to become pregnant or think you may be pregnant. This medication can cause serious birth defects. Do not breast-feed while taking this medication and for 6 months after stopping therapy. This medication may cause infertility. Talk to your care team if you are concerned about your fertility. What side effects may I notice from receiving this medication? Side effects that you should report to your care team as soon as possible: Allergic reactions--skin rash, itching, hives, swelling of the face, lips, tongue, or throat Gallbladder problems--severe stomach pain, nausea, vomiting, fever High blood sugar (hyperglycemia)--increased thirst or amount of urine, unusual weakness or fatigue, blurry vision Increase in blood pressure Low blood sugar (hypoglycemia)--pale, blue or purple  skin or lips, sweating, fussiness, rapid heartbeat, poor feeding, low body temperature Low thyroid levels (hypothyroidism)--unusual weakness or fatigue,  increased sensitivity to cold, constipation, hair loss, dry skin, weight gain, feelings of depression Oily or light-colored stools, diarrhea, bloating, weight loss Slow heartbeat--dizziness, feeling faint or lightheaded, confusion, trouble breathing, unusual weakness or fatigue Side effects that usually do not require medical attention (report these to your care team if they continue or are bothersome): Diarrhea Dizziness Headache Muscle spasms Nausea Pain, redness, or irritation at injection site Stomach pain This list may not describe all possible side effects. Call your doctor for medical advice about side effects. You may report side effects to FDA at 1-800-FDA-1088. Where should I keep my medication? This medication is given in a hospital or clinic. It will not be stored at home. NOTE: This sheet is a summary. It may not cover all possible information. If you have questions about this medicine, talk to your doctor, pharmacist, or health care provider.  2024 Elsevier/Gold Standard (2023-05-03 00:00:00)

## 2023-11-13 DIAGNOSIS — E039 Hypothyroidism, unspecified: Secondary | ICD-10-CM | POA: Diagnosis not present

## 2023-11-25 DIAGNOSIS — L570 Actinic keratosis: Secondary | ICD-10-CM | POA: Diagnosis not present

## 2023-11-25 DIAGNOSIS — X32XXXA Exposure to sunlight, initial encounter: Secondary | ICD-10-CM | POA: Diagnosis not present

## 2023-11-25 DIAGNOSIS — D225 Melanocytic nevi of trunk: Secondary | ICD-10-CM | POA: Diagnosis not present

## 2023-11-25 DIAGNOSIS — L821 Other seborrheic keratosis: Secondary | ICD-10-CM | POA: Diagnosis not present

## 2023-11-25 DIAGNOSIS — L814 Other melanin hyperpigmentation: Secondary | ICD-10-CM | POA: Diagnosis not present

## 2023-12-04 ENCOUNTER — Inpatient Hospital Stay: Attending: Nurse Practitioner

## 2023-12-04 VITALS — BP 141/96 | HR 60 | Temp 98.1°F | Resp 18

## 2023-12-04 DIAGNOSIS — D3A098 Benign carcinoid tumors of other sites: Secondary | ICD-10-CM

## 2023-12-04 DIAGNOSIS — C7B02 Secondary carcinoid tumors of liver: Secondary | ICD-10-CM | POA: Insufficient documentation

## 2023-12-04 DIAGNOSIS — C7A098 Malignant carcinoid tumors of other sites: Secondary | ICD-10-CM | POA: Insufficient documentation

## 2023-12-04 DIAGNOSIS — C7B04 Secondary carcinoid tumors of peritoneum: Secondary | ICD-10-CM | POA: Insufficient documentation

## 2023-12-04 DIAGNOSIS — R978 Other abnormal tumor markers: Secondary | ICD-10-CM | POA: Insufficient documentation

## 2023-12-04 MED ORDER — LANREOTIDE ACETATE 120 MG/0.5ML ~~LOC~~ SOLN
120.0000 mg | Freq: Once | SUBCUTANEOUS | Status: AC
  Administered 2023-12-04: 120 mg via SUBCUTANEOUS
  Filled 2023-12-04: qty 120

## 2023-12-04 NOTE — Patient Instructions (Signed)
 Lanreotide Injection What is this medication? LANREOTIDE (lan REE oh tide) treats high levels of growth hormone (acromegaly). It is used when other therapies have not worked well enough or cannot be tolerated. It works by reducing the amount of growth hormone your body makes. This reduces symptoms and the risk of health problems caused by too much growth hormone, such as diabetes and heart disease. It may also be used to treat neuroendocrine tumors, a cancer of the cells that release hormones and other substances in your body. It works by slowing down the release of these substances from the cells. This slows tumor growth. It also decreases the symptoms of carcinoid syndrome, such as flushing or diarrhea. This medicine may be used for other purposes; ask your health care provider or pharmacist if you have questions. COMMON BRAND NAME(S): Somatuline [place] What should I tell my care team before I take this medication? They need to know if you have any of these conditions: Diabetes Gallbladder disease Heart disease Kidney disease Liver disease Pancreatic disease Thyroid disease An unusual or allergic reaction to lanreotide, other medications, foods, dyes, or preservatives Pregnant or trying to get pregnant Breastfeeding How should I use this medication? This medication is injected under the skin. It is given by your care team in a hospital or clinic setting. Talk to your care team about the use of this medication in children. Special care may be needed. Overdosage: If you think you have taken too much of this medicine contact a poison control center or emergency room at once. NOTE: This medicine is only for you. Do not share this medicine with others. What if I miss a dose? Keep appointments for follow-up doses. It is important not to miss your dose. Call your care team if you are unable to keep an appointment. What may interact with this medication? Bromocriptine Cyclosporine Certain  medications for blood pressure, heart disease, irregular heartbeat Certain medications for diabetes Quinidine Terfenadine This list may not describe all possible interactions. Give your health care provider a list of all the medicines, herbs, non-prescription drugs, or dietary supplements you use. Also tell them if you smoke, drink alcohol, or use illegal drugs. Some items may interact with your medicine. What should I watch for while using this medication? Visit your care team for regular checks on your progress. Tell your care team if your symptoms do not start to get better or if they get worse. Your condition will be monitored carefully while you are receiving this medication. You may need blood work while you are taking this medication. This medication may increase blood sugar. The risk may be higher in patients who already have diabetes. Ask your care team what you can do to lower your risk of diabetes while taking this medication. Talk to your care team if you wish to become pregnant or think you may be pregnant. This medication can cause serious birth defects. Do not breast-feed while taking this medication and for 6 months after stopping therapy. This medication may cause infertility. Talk to your care team if you are concerned about your fertility. What side effects may I notice from receiving this medication? Side effects that you should report to your care team as soon as possible: Allergic reactions--skin rash, itching, hives, swelling of the face, lips, tongue, or throat Gallbladder problems--severe stomach pain, nausea, vomiting, fever High blood sugar (hyperglycemia)--increased thirst or amount of urine, unusual weakness or fatigue, blurry vision Increase in blood pressure Low blood sugar (hypoglycemia)--pale, blue or purple  skin or lips, sweating, fussiness, rapid heartbeat, poor feeding, low body temperature Low thyroid levels (hypothyroidism)--unusual weakness or fatigue,  increased sensitivity to cold, constipation, hair loss, dry skin, weight gain, feelings of depression Oily or light-colored stools, diarrhea, bloating, weight loss Slow heartbeat--dizziness, feeling faint or lightheaded, confusion, trouble breathing, unusual weakness or fatigue Side effects that usually do not require medical attention (report these to your care team if they continue or are bothersome): Diarrhea Dizziness Headache Muscle spasms Nausea Pain, redness, or irritation at injection site Stomach pain This list may not describe all possible side effects. Call your doctor for medical advice about side effects. You may report side effects to FDA at 1-800-FDA-1088. Where should I keep my medication? This medication is given in a hospital or clinic. It will not be stored at home. NOTE: This sheet is a summary. It may not cover all possible information. If you have questions about this medicine, talk to your doctor, pharmacist, or health care provider.  2024 Elsevier/Gold Standard (2023-05-03 00:00:00)

## 2023-12-27 ENCOUNTER — Ambulatory Visit: Payer: Self-pay | Admitting: Cardiology

## 2024-01-01 ENCOUNTER — Inpatient Hospital Stay

## 2024-01-01 ENCOUNTER — Inpatient Hospital Stay (HOSPITAL_BASED_OUTPATIENT_CLINIC_OR_DEPARTMENT_OTHER): Admitting: Oncology

## 2024-01-01 ENCOUNTER — Encounter: Payer: Self-pay | Admitting: *Deleted

## 2024-01-01 VITALS — BP 114/71 | HR 70 | Temp 98.0°F | Resp 18 | Ht 71.0 in | Wt 203.1 lb

## 2024-01-01 DIAGNOSIS — D3A098 Benign carcinoid tumors of other sites: Secondary | ICD-10-CM

## 2024-01-01 DIAGNOSIS — C7B04 Secondary carcinoid tumors of peritoneum: Secondary | ICD-10-CM | POA: Diagnosis not present

## 2024-01-01 DIAGNOSIS — C7A098 Malignant carcinoid tumors of other sites: Secondary | ICD-10-CM | POA: Diagnosis not present

## 2024-01-01 DIAGNOSIS — C7B02 Secondary carcinoid tumors of liver: Secondary | ICD-10-CM | POA: Diagnosis not present

## 2024-01-01 DIAGNOSIS — R978 Other abnormal tumor markers: Secondary | ICD-10-CM | POA: Diagnosis not present

## 2024-01-01 LAB — CMP (CANCER CENTER ONLY)
ALT: 79 U/L — ABNORMAL HIGH (ref 0–44)
AST: 41 U/L (ref 15–41)
Albumin: 4.3 g/dL (ref 3.5–5.0)
Alkaline Phosphatase: 148 U/L — ABNORMAL HIGH (ref 38–126)
Anion gap: 14 (ref 5–15)
BUN: 21 mg/dL — ABNORMAL HIGH (ref 6–20)
CO2: 21 mmol/L — ABNORMAL LOW (ref 22–32)
Calcium: 9.7 mg/dL (ref 8.9–10.3)
Chloride: 107 mmol/L (ref 98–111)
Creatinine: 1.5 mg/dL — ABNORMAL HIGH (ref 0.61–1.24)
GFR, Estimated: 53 mL/min — ABNORMAL LOW (ref >60)
Glucose, Bld: 124 mg/dL — ABNORMAL HIGH (ref 70–99)
Potassium: 3.9 mmol/L (ref 3.5–5.1)
Sodium: 141 mmol/L (ref 135–145)
Total Bilirubin: 1.8 mg/dL — ABNORMAL HIGH (ref 0.0–1.2)
Total Protein: 7.1 g/dL (ref 6.5–8.1)

## 2024-01-01 MED ORDER — LANREOTIDE ACETATE 120 MG/0.5ML ~~LOC~~ SOLN
120.0000 mg | Freq: Once | SUBCUTANEOUS | Status: AC
Start: 2024-01-01 — End: 2024-01-01
  Administered 2024-01-01: 120 mg via SUBCUTANEOUS
  Filled 2024-01-01: qty 120

## 2024-01-01 NOTE — Progress Notes (Signed)
 [facility] OFFICE PROGRESS NOTE   Diagnosis: Carcinoid tumor  INTERVAL HISTORY:   William Hancock returns as scheduled.  He continues monthly lanreotide.  No flushing.  His stool has been loose since undergoing bowel surgery.  He has approximately 2 bowel movements per day.  He feels well. He underwent a histo trip see procedure in [place]  on 12/27/2023 (notes are not available today).  He reports tolerated procedure well.  He reports Dr. Alen- Maluccio recommends a restaging CT within the next few months.  Objective:  Vital signs in last 24 hours:  Blood pressure 114/71, pulse 70, temperature 98 F (36.7 C), temperature source Temporal, resp. rate 18, height 5' 11 (1.803 m), weight 203 lb 1.6 oz (92.1 kg), SpO2 98%.    Resp: Clear bilaterally Cardio: Rate and rhythm GI: The liver edge is palpable a few fingers below the right costal margin.  Nontender. Vascular: No leg edema   Lab Results:  Lab Results  Component Value Date   WBC 5.1 01/31/2023   HGB 13.6 01/31/2023   HCT 38.1 (L) 01/31/2023   MCV 94.3 01/31/2023   PLT 155 01/31/2023   NEUTROABS 2.9 01/31/2023    CMP  Lab Results  Component Value Date   NA 141 01/01/2024   K 3.9 01/01/2024   CL 107 01/01/2024   CO2 21 (L) 01/01/2024   GLUCOSE 124 (H) 01/01/2024   BUN 21 (H) 01/01/2024   CREATININE 1.50 (H) 01/01/2024   CALCIUM  9.7 01/01/2024   PROT 7.1 01/01/2024   ALBUMIN 4.3 01/01/2024   AST 41 01/01/2024   ALT 79 (H) 01/01/2024   ALKPHOS 148 (H) 01/01/2024   BILITOT 1.8 (H) 01/01/2024   GFRNONAA 53 (L) 01/01/2024   GFRAA >60 02/26/2020    Medications: I have reviewed the patient's current medications.   Assessment/Plan: Metastatic neuroendocrine tumor, well-differentiated 10/31/2018 Abdominal ultrasound-numerous nearly confluent heterogeneous masses measuring up to 3.5 cm.   11/04/2018 CT abdomen/pelvis-multiple enhancing hepatic lesions and a central partially calcified spiculated  mesenteric mass measuring 3.1 x 3.1 cm.   11/13/2018 biopsy liver lesion-metastatic well-differentiated neuroendocrine tumor positive for CKAE1-AE3, synaptophysin, chromogranin, CD56 and CDX-2; Ki-67 labeling index less than 3%; TTF-1 negative Netspot  12/12/2018- increased activity associated a lobular mesenteric mass, small bowel medial to the mesenteric mass, and multiple liver lesions Elevated chromogranin A level and 24-hour urine 5 HIAA Monthly lanreotide beginning 12/23/2018 CT 05/13/2019-multiple large hepatic metastases on the dotatate are poorly visualized, several small lesions are decreased in size 1 new lesion, stable mesenteric mass Netspot  09/04/2019-increase in size of left and right liver lesions, stable to slightly decreased radiotracer accumulation, stable central mesenteric mass, no new metastatic lesions Lutathera  10/13/2019 Lutathera  12/04/2019 Lutathera  01/29/2020 Lutathera  03/25/2020 Restaging Netspot  04/22/2020-overall positive measurable response to therapy.  Multiple hepatic metastasis continue to demonstrate intense radiotracer activity, the activity is consistently decreased from preradiotherapy scan.  No evidence of progressive disease in the liver.  Central mesenteric metastasis and adjacent small bowel lesion have very similar metabolic activity and no change in size.  No evidence of new peritoneal metastasis or skeletal metastasis. Monthly lanreotide continued Netspot  03/02/2021-stable radiotracer avid confluent left and right liver lesions, stable central mesenteric nodal metastasis, no new sites of metastatic disease Monthly lanreotide continued MRI abdomen 05/22/2021-innumerable confluent contrast-enhancing liver lesions-not significantly changed compared to the PET/CT, stable mesenteric metastasis 07/19/2021-bland embolization of right hepatic metastases with micro particles in [place]  08/16/2021-MRI abdomen without contrast-innumerable hepatic masses, decreased in size and  overall bulk, especially in  the right hepatic segments 5-8.  Stable central mesenteric mass 11/20/2021-dotatate PET-confluent radiotracer uptake at the right hepatic dome, elongated area of radiotracer uptake at the anterior superior left hepatic lobe, small focus of uptake at the caudate lobe, radiotracer uptake associated with a partially calcified mesenteric mass and adjacent small bowel loop, no new sites of metastatic disease 11/21/2021-bland bleed/particle embolization of the left hepatic artery 01/24/2022-small bowel resection with anastomosis, cholecystectomy and hepatic metastasectomy (gallbladder with chronic cholecystitis with cholelithiasis; mesenteric lymph nodes with 2 of 22 lymph nodes positive for metastatic well differentiated neuroendocrine tumor; small bowel resection with well-differentiated neuroendocrine tumor, 2 cm, WHO grade 2, Ki-67 index approximately 3%, tumor extends from the mucosa to the serosa, mesenteric tumor deposit with necrosis and calcifications, 6 cm in greatest diameter (Ki-67 index approximately 8%), lymphovascular invasion present, perineural invasion present, margins negative for tumor; additional segment small bowel resection-benign small bowel with normal villous architecture and no significant histopathologic change, negative for tumor; liver segment 2 resection with metastatic well differentiated neuroendocrine tumor with extensive necrosis, 2.5 cm in greatest diameter; liver additional segment 2 resection metastatic well-differentiated neuroendocrine tumor with extensive necrosis, 5.1 cm in greatest diameter ERCP 02/02/2022, stent placed (report could not be located in care everywhere) 02/02/2022 exploratory laparotomy with small bowel resection-gangrenous necrosis with acute chronic inflammation; acute serositis with adhesions 02/04/2022 exploratory laparotomy with small bowel resection, appendectomy and small bowel-small bowel anastomosis-benign appendix with  periappendiceal subacute and chronic hemorrhagic serosal reaction, no evidence of carcinoid/neuroendocrine tumor Decreased chromogranin A 03/21/2022 MRI abdomen 05/23/2022-overall decrease in size of innumerable bilobar hepatic metastases, no new hepatic lesions, postsurgical changes with interval resection of the central mesenteric mass Dotatate PET 06/11/2022-decrease in number of radiotracer avid metastases in the liver, remaining metastatic lesions in the right liver, interval resection of central mesenteric mass with partial small bowel resection, no mesenteric or small bowel lesions present MRI abdomen 08/30/2022-innumerable by lobar hepatic metastases, mildly improved from 05/23/2022 MRI Dotatate PET 10/18/2022-compared to 11/20/2021, similar appearance of right hepatic lobe and caudate avid metastatic disease with exception of a new focus of the posterior right hepatic lobe, new SSR positive nonenlarged left paratracheal node Lanreotide continued MRI abdomen 01/28/2023-innumerable liver lesions, stable to slightly decreased in size.  No progressive disease Lanreotide continued CT chest 04/16/2023: Small mediastinal lymph nodes are not pathologically enlarged, slightly larger than images from January 2024, 7 mm groundglass right upper lobe nodule-indeterminate, left paratracheal hypermetabolic node on 4/83/7975 PET measures 6 mm compared to 5 mm in May Dotatate PET 10/02/2023: Stable hepatic metastases, no disease progression, no evidence of thoracic metastases-small left paratracheal radiotracer avid node is no longer radiotracer avid with a potential tiny remaining node Lanreotide continued Histotripsy in  [place]  12/27/2023 History of MI/stent placement CAD Hypertension Renal insufficiency Hypothyroidism diagnosed with markedly elevated TSH and low T4 on 12/18/2018 Acute left elbow/right ankle pain and swelling August 2021 February 2023-acute renal failure following liver embolization  procedure, improved          Disposition: Mr Hancock appears stable.  He will continue monthly lanreotide.  He underwent a histotripsy seizure last week.  We will follow-up on notes from this procedure.  He will contact us  when Dr.Maluccio requests a restaging evaluation.  William Hancock will return for an office visit in 3 months.  He requested we check his thyroid  function studies today.  Arley Hof, MD  01/01/2024  10:50 AM

## 2024-01-03 LAB — CHROMOGRANIN A: Chromogranin A (ng/mL): 615.2 ng/mL — ABNORMAL HIGH (ref 0.0–101.8)

## 2024-01-14 ENCOUNTER — Other Ambulatory Visit: Payer: Self-pay | Admitting: Cardiology

## 2024-01-14 DIAGNOSIS — E782 Mixed hyperlipidemia: Secondary | ICD-10-CM

## 2024-01-14 DIAGNOSIS — I10 Essential (primary) hypertension: Secondary | ICD-10-CM

## 2024-01-14 DIAGNOSIS — I2510 Atherosclerotic heart disease of native coronary artery without angina pectoris: Secondary | ICD-10-CM

## 2024-01-15 ENCOUNTER — Ambulatory Visit: Attending: Cardiovascular Disease | Admitting: Cardiology

## 2024-01-15 ENCOUNTER — Encounter: Payer: Self-pay | Admitting: Cardiology

## 2024-01-15 VITALS — BP 98/64 | HR 67 | Ht 71.0 in | Wt 208.0 lb

## 2024-01-15 DIAGNOSIS — I2510 Atherosclerotic heart disease of native coronary artery without angina pectoris: Secondary | ICD-10-CM | POA: Diagnosis not present

## 2024-01-15 DIAGNOSIS — E782 Mixed hyperlipidemia: Secondary | ICD-10-CM | POA: Diagnosis not present

## 2024-01-15 DIAGNOSIS — I10 Essential (primary) hypertension: Secondary | ICD-10-CM

## 2024-01-15 DIAGNOSIS — R0683 Snoring: Secondary | ICD-10-CM | POA: Diagnosis not present

## 2024-01-15 DIAGNOSIS — R400 Somnolence: Secondary | ICD-10-CM | POA: Insufficient documentation

## 2024-01-15 MED ORDER — LABETALOL HCL 200 MG PO TABS
200.0000 mg | ORAL_TABLET | Freq: Two times a day (BID) | ORAL | 3 refills | Status: AC
Start: 2024-01-15 — End: ?

## 2024-01-15 MED ORDER — AMLODIPINE BESYLATE 10 MG PO TABS: 10.0000 mg | ORAL_TABLET | Freq: Every day | ORAL | 3 refills | Status: AC

## 2024-01-15 MED ORDER — ATORVASTATIN CALCIUM 40 MG PO TABS: 40.0000 mg | ORAL_TABLET | Freq: Every day | ORAL | 3 refills | Status: AC

## 2024-01-15 MED ORDER — NITROGLYCERIN 0.4 MG SL SUBL: 0.4000 mg | SUBLINGUAL_TABLET | SUBLINGUAL | 3 refills | Status: AC | PRN

## 2024-01-15 MED ORDER — ASPIRIN 81 MG PO CHEW: 81.0000 mg | CHEWABLE_TABLET | Freq: Every day | ORAL | 3 refills | Status: AC

## 2024-01-15 MED ORDER — LOSARTAN POTASSIUM 100 MG PO TABS
100.0000 mg | ORAL_TABLET | Freq: Every day | ORAL | 3 refills | Status: AC
Start: 2024-01-15 — End: ?

## 2024-01-15 NOTE — Progress Notes (Signed)
 Cardiology Office Note:  .   Date:  01/15/2024  ID:  William Hancock, DOB 1966-04-16, MRN [id number] PCP: Rexanne Ingle, MD  [facility] Providers Cardiologist:  Newman Lawrence, MD PCP: Rexanne Ingle, MD  Chief Complaint  Patient presents with   Hypertension   Hyperlipidemia   Coronary Artery Disease      History of Present Illness: .    William Hancock is a 58 y.o. male with hypertension, hyperlipidemia, CAD, metastatic neuroendocrine tumor, now s/p potentially curative surgery   Patient is doing well.  He walks about a mile away without any chest pain or shortness of breath.  Stress test in 08/2023 showed moderate-sized infarct in RCA territory with only mild peri-infarct ischemia.  During that stress test, he walked 10 minutes on treadmill without any chest pain symptoms.  Blood pressure is on low side, but generally higher than this.  He does report daytime sleepiness.  On specific questioning, he does endorse snoring at night.  He has never had a sleep study before.  Patient recently underwent follow-up with Dr. Alen Mallucio in [place] for his metastatic neuroendocrine tumor, and underwent histotripsy for one lesion in his liver.  Vitals:   01/15/24 0953  BP: 98/64  Pulse: 67  SpO2: 96%      ROS:  Review of Systems  Constitutional: Positive for malaise/fatigue.  Cardiovascular:  Positive for dyspnea on exertion. Negative for chest pain, leg swelling, palpitations and syncope.     Studies Reviewed: SABRA        EKG 07/30/2023: Normal sinus rhythm Normal ECG When compared with ECG of 10-Jun-2018 02:31, Incomplete right bundle branch block is no longer Present Criteria for Anterior infarct are no longer Present  Labs 4-12/2023: Hb 13.8 Cr 1.5 TSH 4.8   Labs 01/2023: Chol 70, TG 99, HDL 36, LDL 15 Hb 13.6 Cr 8.52, eGFR 55  Echocardiogram 08/2023:  1. Left ventricular ejection fraction, by estimation, is 60 to 65%. Left  ventricular ejection  fraction by 3D volume is 60 %. The left ventricle has  normal function. The left ventricle has no regional wall motion  abnormalities. There is mild left  ventricular hypertrophy of the septal segment. Left ventricular diastolic  parameters are consistent with Grade I diastolic dysfunction (impaired  relaxation). The average left ventricular global longitudinal strain is  -19.7 %. The global longitudinal  strain is normal.   2. Right ventricular systolic function is normal. The right ventricular  size is mildly enlarged. There is normal pulmonary artery systolic  pressure. The estimated right ventricular systolic pressure is 21.5 mmHg.   3. The mitral valve is normal in structure. No evidence of mitral valve  regurgitation. No evidence of mitral stenosis.   4. The aortic valve is tricuspid. Aortic valve regurgitation is not  visualized. No aortic stenosis is present.   5. The inferior vena cava is normal in size with greater than 50%  respiratory variability, suggesting right atrial pressure of 3 mmHg.    Stress test 08/2023:   Findings are consistent with infarction with peri-infarct ischemia. The study is intermediate risk.   1.0 mm of ST depression in the inferior leads (II, III and aVF) was noted.   Left ventricular function is normal. Nuclear stress EF: 56%. The left ventricular ejection fraction is normal (55-65%). End diastolic cavity size is normal.   Moderate size and intensity fixed inferior  defect with mild peri-infarct ischemia in the inferolateral wall  Coronary intervention 06/09/2018: LM: Normal  LAD:  Prox to mid LAD/Diag1 bifurcation severe calcific 80% stenosis        Diag 1 mid 75% stenosis        Atherectomy and prox LAD into Diag         Synergy DES 2.75 X 16 mm DES        Overlapping Stent into prox LAD Synergy DES 3.0 X 38 mm        Provisional stent mid LAD with minicrush technique Synergy DES 2.75 X 16 mm DES Ramus: 50 % proximal disease LCx: Mild luminal  irregularities RCA: Stenting performed 06/06/2018        Severe mid 99% stenosis--->Orsero 3.5 X 26 mm Orsero DES         Post dilatation with 4.0 mm [place] balloon        Severe prox 80% stenosis--->Orsero 3.5 X 9 mm DES        Post dilatation with 4.0 mm [place] balloon   Recommendation: DAPT with Aspirin  and brilinta  for 1 year Aggressive risk factor modification    Physical Exam:   Physical Exam Vitals and nursing note reviewed.  Constitutional:      General: He is not in acute distress. Neck:     Vascular: No JVD.  Cardiovascular:     Rate and Rhythm: Normal rate and regular rhythm.     Heart sounds: Normal heart sounds. No murmur heard. Pulmonary:     Effort: Pulmonary effort is normal.     Breath sounds: Normal breath sounds. No wheezing or rales.  Musculoskeletal:     Right lower leg: No edema.     Left lower leg: No edema.      VISIT DIAGNOSES:   ICD-10-CM   1. Snoring  R06.83 [place] Sleep Study    2. Coronary artery disease involving native coronary artery of native heart without angina pectoris  I25.10 amLODipine  (NORVASC ) 10 MG tablet    aspirin  (ASPIRIN  LOW DOSE) 81 MG chewable tablet    atorvastatin  (LIPITOR) 40 MG tablet    labetalol  (NORMODYNE ) 200 MG tablet    losartan  (COZAAR ) 100 MG tablet    nitroGLYCERIN  (NITROSTAT ) 0.4 MG SL tablet    Lipid panel    3. Essential hypertension  I10 amLODipine  (NORVASC ) 10 MG tablet    aspirin  (ASPIRIN  LOW DOSE) 81 MG chewable tablet    atorvastatin  (LIPITOR) 40 MG tablet    labetalol  (NORMODYNE ) 200 MG tablet    losartan  (COZAAR ) 100 MG tablet    nitroGLYCERIN  (NITROSTAT ) 0.4 MG SL tablet    4. Mixed hyperlipidemia  E78.2 amLODipine  (NORVASC ) 10 MG tablet    aspirin  (ASPIRIN  LOW DOSE) 81 MG chewable tablet    atorvastatin  (LIPITOR) 40 MG tablet    labetalol  (NORMODYNE ) 200 MG tablet    losartan  (COZAAR ) 100 MG tablet    nitroGLYCERIN  (NITROSTAT ) 0.4 MG SL tablet    Lipid panel    5. Daytime sleepiness  R40.0  [place] Sleep Study        ASSESSMENT AND PLAN: .    William Hancock is a 58 y.o. male with hypertension, hyperlipidemia, coronary artery disease, s/p multivessel complex PCI (pro & mid RCA, prox-mid LAD/Diag bifurcation) for NSTEMI in 06/2018, metastatic neuroendocrine tumor, now s/p potentially curative surgery   CAD: S/p multilvessel PCI for NSTEMI 06/2018 No clear anginal symptoms at this time.  Stress test in 08/2023 showed moderate size and intensity fixed inferior  defect with mild peri-infarct ischemia in the inferolateral wall (Stress test 08/2023). In absence  of anginal symptoms, do not need any further workup. Continue medical therapy. Continue aspirin  81 mg daily.   Continue amlodipine  10 mg daily (as long as blood pressure is not always low at home). Continue lipitor 40 mg daily.  Check lipid panel today.  Daytime sleepiness, snoring: Suspect obstructive sleep apnea.  Patient is open to undergoing home sleep study.    Hypertension: Very well controlled on labetalol  200 mg twice daily, amlodipine  10 mg daily, losartan  100 mg daily.  Refilled the same.   Metastatic neuroendocrine tumor: Now s/p surgery. Continue f/u w/Dr. Cloretta and Dr. Lafe in [place].     Meds ordered this encounter  Medications   amLODipine  (NORVASC ) 10 MG tablet    Sig: Take 1 tablet (10 mg total) by mouth daily.    Dispense:  90 tablet    Refill:  3   aspirin  (ASPIRIN  LOW DOSE) 81 MG chewable tablet    Sig: Chew 1 tablet (81 mg total) by mouth daily.    Dispense:  90 tablet    Refill:  3   atorvastatin  (LIPITOR) 40 MG tablet    Sig: Take 1 tablet (40 mg total) by mouth daily.    Dispense:  90 tablet    Refill:  3   labetalol  (NORMODYNE ) 200 MG tablet    Sig: Take 1 tablet (200 mg total) by mouth 2 (two) times daily.    Dispense:  180 tablet    Refill:  3   losartan  (COZAAR ) 100 MG tablet    Sig: Take 1 tablet (100 mg total) by mouth daily.    Dispense:  90 tablet     Refill:  3   nitroGLYCERIN  (NITROSTAT ) 0.4 MG SL tablet    Sig: Place 1 tablet (0.4 mg total) under the tongue every 5 (five) minutes x 3 doses as needed for chest pain.    Dispense:  30 tablet    Refill:  3     F/u in 1 year  Signed, Newman JINNY Lawrence, MD

## 2024-01-15 NOTE — Patient Instructions (Signed)
 Medication Instructions:  Your physician recommends that you continue on your current medications as directed. Please refer to the Current Medication list given to you today.  *If you need a refill on your cardiac medications before your next appointment, please call your pharmacy*  Lab Work: Please complete a Fasting lipid panel today in our first floor lab before you leave today.  If you have labs (blood work) drawn today and your tests are completely normal, you will receive your results only by: MyChart Message (if you have MyChart) OR A paper copy in the mail If you have any lab test that is abnormal or we need to change your treatment, we will call you to review the results.  Testing/Procedures: Your physician has recommended that you have a home sleep study. This test records several body functions during sleep, including: brain activity, eye movement, oxygen and carbon dioxide blood levels, heart rate and rhythm, breathing rate and rhythm, the flow of air through your mouth and nose, snoring, body muscle movements, and chest and belly movement.   Follow-Up: At [facility], you and your health needs are our priority.  As part of our continuing mission to provide you with exceptional heart care, our providers are all part of one team.  This team includes your primary Cardiologist (physician) and Advanced Practice Providers or APPs (Physician Assistants and Nurse Practitioners) who all work together to provide you with the care you need, when you need it.  Your next appointment:   1 year(s)  Provider:   Dr. Newman Lawrence, MD

## 2024-01-16 ENCOUNTER — Ambulatory Visit: Payer: Self-pay | Admitting: *Deleted

## 2024-01-16 LAB — LIPID PANEL
Chol/HDL Ratio: 2.4 ratio (ref 0.0–5.0)
Cholesterol, Total: 80 mg/dL — ABNORMAL LOW (ref 100–199)
HDL: 34 mg/dL — ABNORMAL LOW (ref >39)
LDL Chol Calc (NIH): 23 mg/dL (ref 0–99)
Triglycerides: 130 mg/dL (ref 0–149)
VLDL Cholesterol Cal: 23 mg/dL (ref 5–40)

## 2024-01-20 ENCOUNTER — Encounter: Payer: Self-pay | Admitting: Oncology

## 2024-01-20 ENCOUNTER — Other Ambulatory Visit: Payer: Self-pay | Admitting: *Deleted

## 2024-01-20 ENCOUNTER — Encounter: Payer: Self-pay | Admitting: *Deleted

## 2024-01-20 DIAGNOSIS — D3A098 Benign carcinoid tumors of other sites: Secondary | ICD-10-CM

## 2024-01-20 DIAGNOSIS — C7B02 Secondary carcinoid tumors of liver: Secondary | ICD-10-CM

## 2024-01-27 ENCOUNTER — Other Ambulatory Visit (HOSPITAL_COMMUNITY)

## 2024-01-29 ENCOUNTER — Inpatient Hospital Stay: Attending: Nurse Practitioner

## 2024-01-29 VITALS — BP 130/82 | HR 53 | Temp 97.6°F | Resp 16

## 2024-01-29 DIAGNOSIS — D3A098 Benign carcinoid tumors of other sites: Secondary | ICD-10-CM

## 2024-01-29 DIAGNOSIS — C7A098 Malignant carcinoid tumors of other sites: Secondary | ICD-10-CM | POA: Diagnosis not present

## 2024-01-29 DIAGNOSIS — C7B04 Secondary carcinoid tumors of peritoneum: Secondary | ICD-10-CM | POA: Diagnosis not present

## 2024-01-29 DIAGNOSIS — C7B02 Secondary carcinoid tumors of liver: Secondary | ICD-10-CM | POA: Diagnosis not present

## 2024-01-29 MED ORDER — LANREOTIDE ACETATE 120 MG/0.5ML ~~LOC~~ SOLN
120.0000 mg | Freq: Once | SUBCUTANEOUS | Status: AC
  Administered 2024-01-29: 120 mg via SUBCUTANEOUS
  Filled 2024-01-29: qty 120

## 2024-01-29 NOTE — Patient Instructions (Signed)
 Lanreotide Injection What is this medication? LANREOTIDE (lan REE oh tide) treats high levels of growth hormone (acromegaly). It is used when other therapies have not worked well enough or cannot be tolerated. It works by reducing the amount of growth hormone your body makes. This reduces symptoms and the risk of health problems caused by too much growth hormone, such as diabetes and heart disease. It may also be used to treat neuroendocrine tumors, a cancer of the cells that release hormones and other substances in your body. It works by slowing down the release of these substances from the cells. This slows tumor growth. It also decreases the symptoms of carcinoid syndrome, such as flushing or diarrhea. This medicine may be used for other purposes; ask your health care provider or pharmacist if you have questions. COMMON BRAND NAME(S): Somatuline [place] What should I tell my care team before I take this medication? They need to know if you have any of these conditions: Diabetes Gallbladder disease Heart disease Kidney disease Liver disease Pancreatic disease Thyroid disease An unusual or allergic reaction to lanreotide, other medications, foods, dyes, or preservatives Pregnant or trying to get pregnant Breastfeeding How should I use this medication? This medication is injected under the skin. It is given by your care team in a hospital or clinic setting. Talk to your care team about the use of this medication in children. Special care may be needed. Overdosage: If you think you have taken too much of this medicine contact a poison control center or emergency room at once. NOTE: This medicine is only for you. Do not share this medicine with others. What if I miss a dose? Keep appointments for follow-up doses. It is important not to miss your dose. Call your care team if you are unable to keep an appointment. What may interact with this medication? Bromocriptine Cyclosporine Certain  medications for blood pressure, heart disease, irregular heartbeat Certain medications for diabetes Quinidine Terfenadine This list may not describe all possible interactions. Give your health care provider a list of all the medicines, herbs, non-prescription drugs, or dietary supplements you use. Also tell them if you smoke, drink alcohol, or use illegal drugs. Some items may interact with your medicine. What should I watch for while using this medication? Visit your care team for regular checks on your progress. Tell your care team if your symptoms do not start to get better or if they get worse. Your condition will be monitored carefully while you are receiving this medication. You may need blood work while you are taking this medication. This medication may increase blood sugar. The risk may be higher in patients who already have diabetes. Ask your care team what you can do to lower your risk of diabetes while taking this medication. Talk to your care team if you wish to become pregnant or think you may be pregnant. This medication can cause serious birth defects. Do not breast-feed while taking this medication and for 6 months after stopping therapy. This medication may cause infertility. Talk to your care team if you are concerned about your fertility. What side effects may I notice from receiving this medication? Side effects that you should report to your care team as soon as possible: Allergic reactions--skin rash, itching, hives, swelling of the face, lips, tongue, or throat Gallbladder problems--severe stomach pain, nausea, vomiting, fever High blood sugar (hyperglycemia)--increased thirst or amount of urine, unusual weakness or fatigue, blurry vision Increase in blood pressure Low blood sugar (hypoglycemia)--pale, blue or purple  skin or lips, sweating, fussiness, rapid heartbeat, poor feeding, low body temperature Low thyroid levels (hypothyroidism)--unusual weakness or fatigue,  increased sensitivity to cold, constipation, hair loss, dry skin, weight gain, feelings of depression Oily or light-colored stools, diarrhea, bloating, weight loss Slow heartbeat--dizziness, feeling faint or lightheaded, confusion, trouble breathing, unusual weakness or fatigue Side effects that usually do not require medical attention (report these to your care team if they continue or are bothersome): Diarrhea Dizziness Headache Muscle spasms Nausea Pain, redness, or irritation at injection site Stomach pain This list may not describe all possible side effects. Call your doctor for medical advice about side effects. You may report side effects to FDA at 1-800-FDA-1088. Where should I keep my medication? This medication is given in a hospital or clinic. It will not be stored at home. NOTE: This sheet is a summary. It may not cover all possible information. If you have questions about this medicine, talk to your doctor, pharmacist, or health care provider.  2024 Elsevier/Gold Standard (2023-05-03 00:00:00)

## 2024-02-10 ENCOUNTER — Ambulatory Visit (HOSPITAL_COMMUNITY)
Admission: RE | Admit: 2024-02-10 | Discharge: 2024-02-10 | Disposition: A | Discharge status: Home / Self Care | Source: Ambulatory Visit | Attending: Oncology | Admitting: Oncology

## 2024-02-10 DIAGNOSIS — C7B02 Secondary carcinoid tumors of liver: Secondary | ICD-10-CM | POA: Diagnosis not present

## 2024-02-10 DIAGNOSIS — C787 Secondary malignant neoplasm of liver and intrahepatic bile duct: Secondary | ICD-10-CM | POA: Diagnosis not present

## 2024-02-10 DIAGNOSIS — Z9049 Acquired absence of other specified parts of digestive tract: Secondary | ICD-10-CM | POA: Diagnosis not present

## 2024-02-10 DIAGNOSIS — D3A098 Benign carcinoid tumors of other sites: Secondary | ICD-10-CM | POA: Insufficient documentation

## 2024-02-10 MED ORDER — GADOBUTROL 1 MMOL/ML IV SOLN
10.0000 mL | Freq: Once | INTRAVENOUS | Status: AC | PRN
  Administered 2024-02-10: 10 mL via INTRAVENOUS

## 2024-02-11 ENCOUNTER — Ambulatory Visit: Payer: Self-pay | Admitting: Oncology

## 2024-02-12 ENCOUNTER — Encounter: Payer: Self-pay | Admitting: Oncology

## 2024-02-12 DIAGNOSIS — E039 Hypothyroidism, unspecified: Secondary | ICD-10-CM | POA: Diagnosis not present

## 2024-02-12 DIAGNOSIS — C7A8 Other malignant neuroendocrine tumors: Secondary | ICD-10-CM | POA: Diagnosis not present

## 2024-02-12 NOTE — Telephone Encounter (Signed)
-----   Message from Arley Hof sent at 02/11/2024  5:38 PM EDT ----- Please call patient, please let him know the MRI shows treated liver lesions and some new lesions, copy reprot to Dr Hobbs-maluccio, he should communicate with her regarding treatment options, f/u as  scheduled here, I can see him with next lanreotide injection if he wants to review scan  ----- Message ----- From: Interface, Rad Results In Sent: 02/11/2024   9:19 AM EDT To: Arley KATHEE Hof, MD

## 2024-02-12 NOTE — Telephone Encounter (Signed)
 Left patient VM with MRI results and offered appointment with Dr. Cloretta with next injection to review scan if he wishes. Faxed copy of report to Dr. Michael as well [phone])

## 2024-02-14 ENCOUNTER — Encounter: Payer: Self-pay | Admitting: Oncology

## 2024-02-17 NOTE — Telephone Encounter (Signed)
 High priority scheduling message sent for OV on 9/24 as well

## 2024-02-19 ENCOUNTER — Telehealth: Payer: Self-pay | Admitting: Oncology

## 2024-02-19 NOTE — Telephone Encounter (Signed)
 Left VM with updated appt details for 9/24.

## 2024-02-20 ENCOUNTER — Other Ambulatory Visit: Payer: Self-pay | Admitting: *Deleted

## 2024-02-26 ENCOUNTER — Ambulatory Visit

## 2024-02-26 ENCOUNTER — Inpatient Hospital Stay: Attending: Nurse Practitioner

## 2024-02-26 ENCOUNTER — Inpatient Hospital Stay: Admitting: Oncology

## 2024-02-26 VITALS — BP 124/80 | HR 65 | Temp 98.0°F | Resp 18 | Ht 71.0 in | Wt 205.4 lb

## 2024-02-26 DIAGNOSIS — I10 Essential (primary) hypertension: Secondary | ICD-10-CM | POA: Insufficient documentation

## 2024-02-26 DIAGNOSIS — E039 Hypothyroidism, unspecified: Secondary | ICD-10-CM | POA: Diagnosis not present

## 2024-02-26 DIAGNOSIS — I2510 Atherosclerotic heart disease of native coronary artery without angina pectoris: Secondary | ICD-10-CM | POA: Diagnosis not present

## 2024-02-26 DIAGNOSIS — C7A098 Malignant carcinoid tumors of other sites: Secondary | ICD-10-CM | POA: Insufficient documentation

## 2024-02-26 DIAGNOSIS — D3A098 Benign carcinoid tumors of other sites: Secondary | ICD-10-CM

## 2024-02-26 DIAGNOSIS — C7B04 Secondary carcinoid tumors of peritoneum: Secondary | ICD-10-CM | POA: Insufficient documentation

## 2024-02-26 DIAGNOSIS — I252 Old myocardial infarction: Secondary | ICD-10-CM | POA: Diagnosis not present

## 2024-02-26 DIAGNOSIS — Z23 Encounter for immunization: Secondary | ICD-10-CM | POA: Diagnosis not present

## 2024-02-26 DIAGNOSIS — C7B02 Secondary carcinoid tumors of liver: Secondary | ICD-10-CM | POA: Insufficient documentation

## 2024-02-26 MED ORDER — LANREOTIDE ACETATE 120 MG/0.5ML ~~LOC~~ SOLN
120.0000 mg | Freq: Once | SUBCUTANEOUS | Status: AC
  Administered 2024-02-26: 120 mg via SUBCUTANEOUS
  Filled 2024-02-26: qty 120

## 2024-02-26 MED ORDER — INFLUENZA VIRUS VACC SPLIT PF (FLUZONE) 0.5 ML IM SUSY
0.5000 mL | PREFILLED_SYRINGE | Freq: Once | INTRAMUSCULAR | Status: AC
  Administered 2024-02-26: 0.5 mL via INTRAMUSCULAR
  Filled 2024-02-26: qty 0.5

## 2024-02-26 NOTE — Progress Notes (Signed)
 [facility] OFFICE PROGRESS NOTE   Diagnosis: Carcinoid tumor  INTERVAL HISTORY:   Mr. Breshears turns as scheduled.  He feels well.  Good appetite and energy level.  He has several loose bowel movements per day.  No new complaint.  He had a telehealth visit with Dr.Maluccio 02/24/2024.  She feels he has experienced a good response to previous embolization therapy and recommends embolization treatment to residual disease in the central liver.  This is based on recent imaging and an elevated pancreas statin level.  He plans to schedule an embolization procedure for within the next month.  Objective:  Vital signs in last 24 hours:  Blood pressure 124/80, pulse 65, temperature 98 F (36.7 C), temperature source Temporal, resp. rate 18, height 5' 11 (1.803 m), weight 205 lb 6.4 oz (93.2 kg), SpO2 98%.   Resp: Lungs clear bilaterally Cardio: Regular rate and rhythm GI: No hepatosplenomegaly, no mass, nontender Vascular: No leg edema  Lab Results:  Lab Results  Component Value Date   WBC 5.1 01/31/2023   HGB 13.6 01/31/2023   HCT 38.1 (L) 01/31/2023   MCV 94.3 01/31/2023   PLT 155 01/31/2023   NEUTROABS 2.9 01/31/2023    CMP  Lab Results  Component Value Date   NA 141 01/01/2024   K 3.9 01/01/2024   CL 107 01/01/2024   CO2 21 (L) 01/01/2024   GLUCOSE 124 (H) 01/01/2024   BUN 21 (H) 01/01/2024   CREATININE 1.50 (H) 01/01/2024   CALCIUM  9.7 01/01/2024   PROT 7.1 01/01/2024   ALBUMIN 4.3 01/01/2024   AST 41 01/01/2024   ALT 79 (H) 01/01/2024   ALKPHOS 148 (H) 01/01/2024   BILITOT 1.8 (H) 01/01/2024   GFRNONAA 53 (L) 01/01/2024   GFRAA >60 02/26/2020     Medications: I have reviewed the patient's current medications.   Assessment/Plan: Metastatic neuroendocrine tumor, well-differentiated 10/31/2018 Abdominal ultrasound-numerous nearly confluent heterogeneous masses measuring up to 3.5 cm.   11/04/2018 CT abdomen/pelvis-multiple enhancing hepatic lesions  and a central partially calcified spiculated mesenteric mass measuring 3.1 x 3.1 cm.   11/13/2018 biopsy liver lesion-metastatic well-differentiated neuroendocrine tumor positive for CKAE1-AE3, synaptophysin, chromogranin, CD56 and CDX-2; Ki-67 labeling index less than 3%; TTF-1 negative Netspot  12/12/2018- increased activity associated a lobular mesenteric mass, small bowel medial to the mesenteric mass, and multiple liver lesions Elevated chromogranin A level and 24-hour urine 5 HIAA Monthly lanreotide beginning 12/23/2018 CT 05/13/2019-multiple large hepatic metastases on the dotatate are poorly visualized, several small lesions are decreased in size 1 new lesion, stable mesenteric mass Netspot  09/04/2019-increase in size of left and right liver lesions, stable to slightly decreased radiotracer accumulation, stable central mesenteric mass, no new metastatic lesions Lutathera  10/13/2019 Lutathera  12/04/2019 Lutathera  01/29/2020 Lutathera  03/25/2020 Restaging Netspot  04/22/2020-overall positive measurable response to therapy.  Multiple hepatic metastasis continue to demonstrate intense radiotracer activity, the activity is consistently decreased from preradiotherapy scan.  No evidence of progressive disease in the liver.  Central mesenteric metastasis and adjacent small bowel lesion have very similar metabolic activity and no change in size.  No evidence of new peritoneal metastasis or skeletal metastasis. Monthly lanreotide continued Netspot  03/02/2021-stable radiotracer avid confluent left and right liver lesions, stable central mesenteric nodal metastasis, no new sites of metastatic disease Monthly lanreotide continued MRI abdomen 05/22/2021-innumerable confluent contrast-enhancing liver lesions-not significantly changed compared to the PET/CT, stable mesenteric metastasis 07/19/2021-bland embolization of right hepatic metastases with micro particles in [place]  08/16/2021-MRI abdomen without  contrast-innumerable hepatic masses, decreased in size  and overall bulk, especially in the right hepatic segments 5-8.  Stable central mesenteric mass 11/20/2021-dotatate PET-confluent radiotracer uptake at the right hepatic dome, elongated area of radiotracer uptake at the anterior superior left hepatic lobe, small focus of uptake at the caudate lobe, radiotracer uptake associated with a partially calcified mesenteric mass and adjacent small bowel loop, no new sites of metastatic disease 11/21/2021-bland bleed/particle embolization of the left hepatic artery 01/24/2022-small bowel resection with anastomosis, cholecystectomy and hepatic metastasectomy (gallbladder with chronic cholecystitis with cholelithiasis; mesenteric lymph nodes with 2 of 22 lymph nodes positive for metastatic well differentiated neuroendocrine tumor; small bowel resection with well-differentiated neuroendocrine tumor, 2 cm, WHO grade 2, Ki-67 index approximately 3%, tumor extends from the mucosa to the serosa, mesenteric tumor deposit with necrosis and calcifications, 6 cm in greatest diameter (Ki-67 index approximately 8%), lymphovascular invasion present, perineural invasion present, margins negative for tumor; additional segment small bowel resection-benign small bowel with normal villous architecture and no significant histopathologic change, negative for tumor; liver segment 2 resection with metastatic well differentiated neuroendocrine tumor with extensive necrosis, 2.5 cm in greatest diameter; liver additional segment 2 resection metastatic well-differentiated neuroendocrine tumor with extensive necrosis, 5.1 cm in greatest diameter ERCP 02/02/2022, stent placed (report could not be located in care everywhere) 02/02/2022 exploratory laparotomy with small bowel resection-gangrenous necrosis with acute chronic inflammation; acute serositis with adhesions 02/04/2022 exploratory laparotomy with small bowel resection, appendectomy and small  bowel-small bowel anastomosis-benign appendix with periappendiceal subacute and chronic hemorrhagic serosal reaction, no evidence of carcinoid/neuroendocrine tumor Decreased chromogranin A 03/21/2022 MRI abdomen 05/23/2022-overall decrease in size of innumerable bilobar hepatic metastases, no new hepatic lesions, postsurgical changes with interval resection of the central mesenteric mass Dotatate PET 06/11/2022-decrease in number of radiotracer avid metastases in the liver, remaining metastatic lesions in the right liver, interval resection of central mesenteric mass with partial small bowel resection, no mesenteric or small bowel lesions present MRI abdomen 08/30/2022-innumerable by lobar hepatic metastases, mildly improved from 05/23/2022 MRI Dotatate PET 10/18/2022-compared to 11/20/2021, similar appearance of right hepatic lobe and caudate avid metastatic disease with exception of a new focus of the posterior right hepatic lobe, new SSR positive nonenlarged left paratracheal node Lanreotide continued MRI abdomen 01/28/2023-innumerable liver lesions, stable to slightly decreased in size.  No progressive disease Lanreotide continued CT chest 04/16/2023: Small mediastinal lymph nodes are not pathologically enlarged, slightly larger than images from January 2024, 7 mm groundglass right upper lobe nodule-indeterminate, left paratracheal hypermetabolic node on 4/83/7975 PET measures 6 mm compared to 5 mm in May Dotatate PET 10/02/2023: Stable hepatic metastases, no disease progression, no evidence of thoracic metastases-small left paratracheal radiotracer avid node is no longer radiotracer avid with a potential tiny remaining node Lanreotide continued Histotripsy in  [place]  12/27/2023 MRI abdomen 02/10/2024: Extensive hepatic metastatic disease, new small contrast-enhancing lesions with an increase in enhancing soft tissue associated with treated lesions History of MI/stent placement CAD Hypertension Renal  insufficiency Hypothyroidism diagnosed with markedly elevated TSH and low T4 on 12/18/2018 Acute left elbow/right ankle pain and swelling August 2021 February 2023-acute renal failure following liver embolization procedure, improved     Disposition: [name] has metastatic carcinoid tumor involving the liver.  He continues monthly lanreotide.  An MRI earlier this month suggested disease progression in the liver.  He saw Dr. Maluccio for telehealth visit earlier this week.  She reviewed the MRI images and feels there is persistent/progressive disease in the central liver.  She recommends another embolization procedure as opposed to  PRRT or chemotherapy.  He plans to schedule the embolization procedure for within the next month.  He will return for an office visit in 2 months.  Arley Hof, MD  02/26/2024  9:38 AM

## 2024-03-09 ENCOUNTER — Encounter: Payer: Self-pay | Admitting: Gastroenterology

## 2024-03-13 ENCOUNTER — Telehealth: Payer: Self-pay

## 2024-03-13 NOTE — Telephone Encounter (Signed)
 Ordering provider: Dr. Elmira Associated diagnoses: Snoring [R06.83]; Daytime sleepiness [R40.0]  Patient NOT notified of PIN ([id number]) on 03/13/2024   Spoke with patient and patient stated that he will come to the office on Monday to pick up Watch Pat. I told him the days, time and person that he will ask for. Patient verbalized understanding.  Phone note routed to covering staff for follow-up.

## 2024-03-17 NOTE — Telephone Encounter (Signed)
 Patient agreement reviewed and signed on 03/17/2024.  WatchPAT issued to patient on 03/17/2024 by Joshua Dalton Seip, CMA. Patient aware to not open the WatchPAT box until contacted with the activation PIN. Patient profile initialized in Cloud[employer] on 03/17/2024 by DJ.CMA. Device serial number: [id number]  Please list Reason for Call as Advice Only and type WatchPAT issued to patient in the comment box.

## 2024-03-20 ENCOUNTER — Encounter: Payer: Self-pay | Admitting: Oncology

## 2024-03-23 ENCOUNTER — Telehealth: Payer: Self-pay | Admitting: Oncology

## 2024-03-23 NOTE — Telephone Encounter (Signed)
 Left detailed message on PT phone regarding appt time being moved up.

## 2024-03-24 ENCOUNTER — Inpatient Hospital Stay: Attending: Nurse Practitioner

## 2024-03-24 ENCOUNTER — Inpatient Hospital Stay

## 2024-03-24 VITALS — BP 132/81 | HR 56 | Temp 97.9°F | Resp 18

## 2024-03-24 DIAGNOSIS — C7B02 Secondary carcinoid tumors of liver: Secondary | ICD-10-CM | POA: Insufficient documentation

## 2024-03-24 DIAGNOSIS — D3A098 Benign carcinoid tumors of other sites: Secondary | ICD-10-CM

## 2024-03-24 DIAGNOSIS — C7A098 Malignant carcinoid tumors of other sites: Secondary | ICD-10-CM | POA: Diagnosis not present

## 2024-03-24 DIAGNOSIS — C7B04 Secondary carcinoid tumors of peritoneum: Secondary | ICD-10-CM | POA: Insufficient documentation

## 2024-03-24 MED ORDER — LANREOTIDE ACETATE 120 MG/0.5ML ~~LOC~~ SOLN
120.0000 mg | Freq: Once | SUBCUTANEOUS | Status: AC
  Administered 2024-03-24: 120 mg via SUBCUTANEOUS
  Filled 2024-03-24: qty 120

## 2024-03-24 NOTE — Patient Instructions (Signed)
 Lanreotide Injection What is this medication? LANREOTIDE (lan REE oh tide) treats high levels of growth hormone (acromegaly). It is used when other therapies have not worked well enough or cannot be tolerated. It works by reducing the amount of growth hormone your body makes. This reduces symptoms and the risk of health problems caused by too much growth hormone, such as diabetes and heart disease. It may also be used to treat neuroendocrine tumors, a cancer of the cells that release hormones and other substances in your body. It works by slowing down the release of these substances from the cells. This slows tumor growth. It also decreases the symptoms of carcinoid syndrome, such as flushing or diarrhea. This medicine may be used for other purposes; ask your health care provider or pharmacist if you have questions. COMMON BRAND NAME(S): Somatuline [place] What should I tell my care team before I take this medication? They need to know if you have any of these conditions: Diabetes Gallbladder disease Heart disease Kidney disease Liver disease Pancreatic disease Thyroid disease An unusual or allergic reaction to lanreotide, other medications, foods, dyes, or preservatives Pregnant or trying to get pregnant Breastfeeding How should I use this medication? This medication is injected under the skin. It is given by your care team in a hospital or clinic setting. Talk to your care team about the use of this medication in children. Special care may be needed. Overdosage: If you think you have taken too much of this medicine contact a poison control center or emergency room at once. NOTE: This medicine is only for you. Do not share this medicine with others. What if I miss a dose? Keep appointments for follow-up doses. It is important not to miss your dose. Call your care team if you are unable to keep an appointment. What may interact with this medication? Bromocriptine Cyclosporine Certain  medications for blood pressure, heart disease, irregular heartbeat Certain medications for diabetes Quinidine Terfenadine This list may not describe all possible interactions. Give your health care provider a list of all the medicines, herbs, non-prescription drugs, or dietary supplements you use. Also tell them if you smoke, drink alcohol, or use illegal drugs. Some items may interact with your medicine. What should I watch for while using this medication? Visit your care team for regular checks on your progress. Tell your care team if your symptoms do not start to get better or if they get worse. Your condition will be monitored carefully while you are receiving this medication. You may need blood work while you are taking this medication. This medication may increase blood sugar. The risk may be higher in patients who already have diabetes. Ask your care team what you can do to lower your risk of diabetes while taking this medication. Talk to your care team if you wish to become pregnant or think you may be pregnant. This medication can cause serious birth defects. Do not breast-feed while taking this medication and for 6 months after stopping therapy. This medication may cause infertility. Talk to your care team if you are concerned about your fertility. What side effects may I notice from receiving this medication? Side effects that you should report to your care team as soon as possible: Allergic reactions--skin rash, itching, hives, swelling of the face, lips, tongue, or throat Gallbladder problems--severe stomach pain, nausea, vomiting, fever High blood sugar (hyperglycemia)--increased thirst or amount of urine, unusual weakness or fatigue, blurry vision Increase in blood pressure Low blood sugar (hypoglycemia)--pale, blue or purple  skin or lips, sweating, fussiness, rapid heartbeat, poor feeding, low body temperature Low thyroid levels (hypothyroidism)--unusual weakness or fatigue,  increased sensitivity to cold, constipation, hair loss, dry skin, weight gain, feelings of depression Oily or light-colored stools, diarrhea, bloating, weight loss Slow heartbeat--dizziness, feeling faint or lightheaded, confusion, trouble breathing, unusual weakness or fatigue Side effects that usually do not require medical attention (report these to your care team if they continue or are bothersome): Diarrhea Dizziness Headache Muscle spasms Nausea Pain, redness, or irritation at injection site Stomach pain This list may not describe all possible side effects. Call your doctor for medical advice about side effects. You may report side effects to FDA at 1-800-FDA-1088. Where should I keep my medication? This medication is given in a hospital or clinic. It will not be stored at home. NOTE: This sheet is a summary. It may not cover all possible information. If you have questions about this medicine, talk to your doctor, pharmacist, or health care provider.  2024 Elsevier/Gold Standard (2023-05-03 00:00:00)

## 2024-03-25 ENCOUNTER — Ambulatory Visit: Admitting: Oncology

## 2024-03-25 ENCOUNTER — Ambulatory Visit

## 2024-04-08 ENCOUNTER — Encounter: Payer: Self-pay | Admitting: Oncology

## 2024-04-08 ENCOUNTER — Telehealth: Payer: Self-pay | Admitting: Cardiology

## 2024-04-08 NOTE — Telephone Encounter (Signed)
 Printed orders for MD

## 2024-04-08 NOTE — Telephone Encounter (Signed)
 Patient calling with questions concerning his overnight  sleep monitor. Please advise

## 2024-04-09 ENCOUNTER — Other Ambulatory Visit: Payer: Self-pay | Admitting: *Deleted

## 2024-04-09 DIAGNOSIS — D3A098 Benign carcinoid tumors of other sites: Secondary | ICD-10-CM

## 2024-04-09 NOTE — Telephone Encounter (Signed)
**Note De-Identified William Hancock Obfuscation** The pt states that after he picked up his WatchPAT One-HST device from us  that he has had a surgery and has been unable to do his Home Sleep Study. He wanted to know if it is ok for him to do his home sleep test within the next 2 weeks and I advised him that he can. He is aware that his PIN # is still [id number].  He thanked me for calling him back

## 2024-04-13 ENCOUNTER — Telehealth: Payer: Self-pay | Admitting: Oncology

## 2024-04-13 ENCOUNTER — Other Ambulatory Visit: Payer: Self-pay | Admitting: *Deleted

## 2024-04-13 DIAGNOSIS — C7B02 Secondary carcinoid tumors of liver: Secondary | ICD-10-CM

## 2024-04-13 NOTE — Telephone Encounter (Signed)
 Called to sch lab. Day and time confirmed.

## 2024-04-14 ENCOUNTER — Inpatient Hospital Stay: Attending: Nurse Practitioner

## 2024-04-14 DIAGNOSIS — C7B04 Secondary carcinoid tumors of peritoneum: Secondary | ICD-10-CM | POA: Insufficient documentation

## 2024-04-14 DIAGNOSIS — C7A098 Malignant carcinoid tumors of other sites: Secondary | ICD-10-CM | POA: Diagnosis not present

## 2024-04-14 DIAGNOSIS — C7B02 Secondary carcinoid tumors of liver: Secondary | ICD-10-CM | POA: Insufficient documentation

## 2024-04-14 DIAGNOSIS — D3A098 Benign carcinoid tumors of other sites: Secondary | ICD-10-CM

## 2024-04-14 LAB — CMP (CANCER CENTER ONLY)
ALT: 47 U/L — ABNORMAL HIGH (ref 0–44)
AST: 25 U/L (ref 15–41)
Albumin: 4 g/dL (ref 3.5–5.0)
Alkaline Phosphatase: 196 U/L — ABNORMAL HIGH (ref 38–126)
Anion gap: 8 (ref 5–15)
BUN: 22 mg/dL — ABNORMAL HIGH (ref 6–20)
CO2: 27 mmol/L (ref 22–32)
Calcium: 9.9 mg/dL (ref 8.9–10.3)
Chloride: 104 mmol/L (ref 98–111)
Creatinine: 1.43 mg/dL — ABNORMAL HIGH (ref 0.61–1.24)
GFR, Estimated: 56 mL/min — ABNORMAL LOW (ref >60)
Glucose, Bld: 87 mg/dL (ref 70–99)
Potassium: 4.5 mmol/L (ref 3.5–5.1)
Sodium: 139 mmol/L (ref 135–145)
Total Bilirubin: 0.8 mg/dL (ref 0.0–1.2)
Total Protein: 7.2 g/dL (ref 6.5–8.1)

## 2024-04-14 LAB — CBC WITH DIFFERENTIAL (CANCER CENTER ONLY)
Abs Immature Granulocytes: 0.12 K/uL — ABNORMAL HIGH (ref 0.00–0.07)
Basophils Absolute: 0.1 K/uL (ref 0.0–0.1)
Basophils Relative: 1 %
Eosinophils Absolute: 0 K/uL (ref 0.0–0.5)
Eosinophils Relative: 0 %
HCT: 38.5 % — ABNORMAL LOW (ref 39.0–52.0)
Hemoglobin: 12.9 g/dL — ABNORMAL LOW (ref 13.0–17.0)
Immature Granulocytes: 1 %
Lymphocytes Relative: 17 %
Lymphs Abs: 1.9 K/uL (ref 0.7–4.0)
MCH: 31.6 pg (ref 26.0–34.0)
MCHC: 33.5 g/dL (ref 30.0–36.0)
MCV: 94.4 fL (ref 80.0–100.0)
Monocytes Absolute: 0.7 K/uL (ref 0.1–1.0)
Monocytes Relative: 7 %
Neutro Abs: 8.1 K/uL — ABNORMAL HIGH (ref 1.7–7.7)
Neutrophils Relative %: 74 %
Platelet Count: 577 K/uL — ABNORMAL HIGH (ref 150–400)
RBC: 4.08 MIL/uL — ABNORMAL LOW (ref 4.22–5.81)
RDW: 12.2 % (ref 11.5–15.5)
WBC Count: 10.9 K/uL — ABNORMAL HIGH (ref 4.0–10.5)
nRBC: 0 % (ref 0.0–0.2)

## 2024-04-16 LAB — CHROMOGRANIN A: Chromogranin A (ng/mL): 213.9 ng/mL — ABNORMAL HIGH (ref 0.0–101.8)

## 2024-04-17 ENCOUNTER — Encounter: Payer: Self-pay | Admitting: *Deleted

## 2024-04-20 DIAGNOSIS — C7A8 Other malignant neuroendocrine tumors: Secondary | ICD-10-CM | POA: Diagnosis not present

## 2024-04-21 ENCOUNTER — Ambulatory Visit (HOSPITAL_COMMUNITY)
Admission: RE | Admit: 2024-04-21 | Discharge: 2024-04-21 | Disposition: A | Discharge status: Home / Self Care | Source: Ambulatory Visit | Attending: Oncology | Admitting: Oncology

## 2024-04-21 DIAGNOSIS — C7A8 Other malignant neuroendocrine tumors: Secondary | ICD-10-CM | POA: Diagnosis not present

## 2024-04-21 DIAGNOSIS — R932 Abnormal findings on diagnostic imaging of liver and biliary tract: Secondary | ICD-10-CM | POA: Diagnosis not present

## 2024-04-21 DIAGNOSIS — K769 Liver disease, unspecified: Secondary | ICD-10-CM | POA: Diagnosis not present

## 2024-04-21 DIAGNOSIS — C7B02 Secondary carcinoid tumors of liver: Secondary | ICD-10-CM | POA: Insufficient documentation

## 2024-04-21 MED ORDER — GADOBUTROL 1 MMOL/ML IV SOLN
9.0000 mL | Freq: Once | INTRAVENOUS | Status: AC | PRN
  Administered 2024-04-21: 9 mL via INTRAVENOUS

## 2024-04-22 ENCOUNTER — Inpatient Hospital Stay

## 2024-04-22 ENCOUNTER — Inpatient Hospital Stay: Admitting: Oncology

## 2024-04-22 VITALS — BP 101/72 | HR 69 | Temp 97.8°F | Resp 18 | Ht 71.0 in | Wt 196.9 lb

## 2024-04-22 DIAGNOSIS — C7B02 Secondary carcinoid tumors of liver: Secondary | ICD-10-CM

## 2024-04-22 DIAGNOSIS — C7A098 Malignant carcinoid tumors of other sites: Secondary | ICD-10-CM | POA: Diagnosis not present

## 2024-04-22 DIAGNOSIS — D3A098 Benign carcinoid tumors of other sites: Secondary | ICD-10-CM

## 2024-04-22 DIAGNOSIS — C7B04 Secondary carcinoid tumors of peritoneum: Secondary | ICD-10-CM | POA: Diagnosis not present

## 2024-04-22 MED ORDER — LANREOTIDE ACETATE 120 MG/0.5ML ~~LOC~~ SOLN
120.0000 mg | Freq: Once | SUBCUTANEOUS | Status: AC
  Administered 2024-04-22: 120 mg via SUBCUTANEOUS
  Filled 2024-04-22: qty 0.5

## 2024-04-22 NOTE — Progress Notes (Signed)
  [facility] OFF[facility] PROGRESS [facility]   Diagnosis: Carcinoid tumor  INTERVAL HISTORY:   William Hancock returns for a scheduled visit.  He continues monthly lanreotide.  He reports mild flushing prior to undergoing the embolization procedure last month.  He underwent bland embolization of the right liver on 03/31/2024.  He was discharged the following day.  He reports a low-grade fever and nausea while in the hospital.  He feels well at present.  He is scheduled for a telehealth visit with Dr. Alen- Maluccio next week.   Objective:  Vital signs in last 24 hours:  Blood pressure 101/72, pulse 69, temperature 97.8 F (36.6 C), temperature source Temporal, resp. rate 18, height 5' 11 (1.803 m), weight 196 lb 14.4 oz (89.3 kg), SpO2 100%.   Resp: Lungs clear bilaterally Cardio: Regular rate and rhythm GI: No hepatosplenomegaly, no mass, nontender Vascular: No leg edema     Lab Results:  Lab Results  Component Value Date   WBC 10.9 (H) 04/14/2024   HGB 12.9 (L) 04/14/2024   HCT 38.5 (L) 04/14/2024   MCV 94.4 04/14/2024   PLT 577 (H) 04/14/2024   NEUTROABS 8.1 (H) 04/14/2024    CMP  Lab Results  Component Value Date   NA 139 04/14/2024   K 4.5 04/14/2024   CL 104 04/14/2024   CO2 27 04/14/2024   GLUCOSE 87 04/14/2024   BUN 22 (H) 04/14/2024   CREATININE 1.43 (H) 04/14/2024   CALCIUM  9.9 04/14/2024   PROT 7.2 04/14/2024   ALBUMIN 4.0 04/14/2024   AST 25 04/14/2024   ALT 47 (H) 04/14/2024   ALKPHOS 196 (H) 04/14/2024   BILITOT 0.8 04/14/2024   GFRNONAA 56 (L) 04/14/2024   GFRAA >60 02/26/2020    Imaging:  MR Abdomen W Wo Contrast Result Date: 04/21/2024 CLINICAL DATA:  Metastatic neuroendocrine tumor to liver EXAM: MRI ABDOMEN WITHOUT AND WITH CONTRAST TECHNIQUE: Multiplanar multisequence MR imaging of the abdomen was performed both before and after the administration of intravenous contrast. CONTRAST:  9mL GADAVIST  GADOBUTROL  1 MMOL/ML IV SOLN  COMPARISON:  MRI abdomen dated 02/10/2024 FINDINGS: Lower chest: No acute findings. Small focal outpouching at the left ventricular apex (4:8) represent a small aneurysm. Hepatobiliary: Similar nodular hepatic contour with areas of capsular retraction. Interval markedly improved diffusion restriction throughout the liver associated with new central nonenhancement of many lesions, for example segment 4/5 measuring 2.2 x 1.8 cm (11:38), previously 2.7 x 2.3 cm, and a confluent area of non enhancement near the hepatic dome (11:12). Again seen are many additional treated, nonenhancing lesions. No bile duct dilation. Cholecystectomy. Pancreas: No mass, inflammatory changes, or other parenchymal abnormality identified. Spleen:  Within normal limits in size and appearance. Adrenals/Urinary Tract: No adrenal nodules. No suspicious renal masses identified. No evidence of hydronephrosis. Stomach/Bowel: Visualized portions within the abdomen are unremarkable. Vascular/Lymphatic: No pathologically enlarged lymph nodes identified. No abdominal aortic aneurysm demonstrated. 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 Musculoskeletal: No suspicious bone lesions identified. Postsurgical changes of the anterior abdominal wall. IMPRESSION: 1. Interval markedly improved diffusion restriction throughout the liver associated with new central nonenhancement of many lesions, consistent with treatment response. 2. Similar nodular hepatic contour with areas of capsular retraction. 3. 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 Electronically Signed   By: Limin  Xu M.D.   On: 04/21/2024 16:32    Medications: I have reviewed the  patient's current medications.   Assessment/Plan: Metastatic neuroendocrine tumor, well-differentiated 10/31/2018 Abdominal ultrasound-numerous nearly confluent  heterogeneous masses measuring up to 3.5 cm.   11/04/2018 CT abdomen/pelvis-multiple enhancing hepatic lesions and a central partially calcified spiculated mesenteric mass measuring 3.1 x 3.1 cm.   11/13/2018 biopsy liver lesion-metastatic well-differentiated neuroendocrine tumor positive for CKAE1-AE3, synaptophysin, chromogranin, CD56 and CDX-2; Ki-67 labeling index less than 3%; TTF-1 negative Netspot  12/12/2018- increased activity associated a lobular mesenteric mass, small bowel medial to the mesenteric mass, and multiple liver lesions Elevated chromogranin A level and 24-hour urine 5 HIAA Monthly lanreotide beginning 12/23/2018 CT 05/13/2019-multiple large hepatic metastases on the dotatate are poorly visualized, several small lesions are decreased in size 1 new lesion, stable mesenteric mass Netspot  09/04/2019-increase in size of left and right liver lesions, stable to slightly decreased radiotracer accumulation, stable central mesenteric mass, no new metastatic lesions Lutathera  10/13/2019 Lutathera  12/04/2019 Lutathera  01/29/2020 Lutathera  03/25/2020 Restaging Netspot  04/22/2020-overall positive measurable response to therapy.  Multiple hepatic metastasis continue to demonstrate intense radiotracer activity, the activity is consistently decreased from preradiotherapy scan.  No evidence of progressive disease in the liver.  Central mesenteric metastasis and adjacent small bowel lesion have very similar metabolic activity and no change in size.  No evidence of new peritoneal metastasis or skeletal metastasis. Monthly lanreotide continued Netspot  03/02/2021-stable radiotracer avid confluent left and right liver lesions, stable central mesenteric nodal metastasis, no new sites of metastatic disease Monthly lanreotide continued MRI abdomen 05/22/2021-innumerable confluent contrast-enhancing liver lesions-not significantly changed compared to the PET/CT, stable mesenteric metastasis 07/19/2021-bland  embolization of right hepatic metastases with micro particles in [place]  08/16/2021-MRI abdomen without contrast-innumerable hepatic masses, decreased in size and overall bulk, especially in the right hepatic segments 5-8.  Stable central mesenteric mass 11/20/2021-dotatate PET-confluent radiotracer uptake at the right hepatic dome, elongated area of radiotracer uptake at the anterior superior left hepatic lobe, small focus of uptake at the caudate lobe, radiotracer uptake associated with a partially calcified mesenteric mass and adjacent small bowel loop, no new sites of metastatic disease 11/21/2021-bland bleed/particle embolization of the left hepatic artery 01/24/2022-small bowel resection with anastomosis, cholecystectomy and hepatic metastasectomy (gallbladder with chronic cholecystitis with cholelithiasis; mesenteric lymph nodes with 2 of 22 lymph nodes positive for metastatic well differentiated neuroendocrine tumor; small bowel resection with well-differentiated neuroendocrine tumor, 2 cm, WHO grade 2, Ki-67 index approximately 3%, tumor extends from the mucosa to the serosa, mesenteric tumor deposit with necrosis and calcifications, 6 cm in greatest diameter (Ki-67 index approximately 8%), lymphovascular invasion present, perineural invasion present, margins negative for tumor; additional segment small bowel resection-benign small bowel with normal villous architecture and no significant histopathologic change, negative for tumor; liver segment 2 resection with metastatic well differentiated neuroendocrine tumor with extensive necrosis, 2.5 cm in greatest diameter; liver additional segment 2 resection metastatic well-differentiated neuroendocrine tumor with extensive necrosis, 5.1 cm in greatest diameter ERCP 02/02/2022, stent placed (report could not be located in care everywhere) 02/02/2022 exploratory laparotomy with small bowel resection-gangrenous necrosis with acute chronic inflammation; acute  serositis with adhesions 02/04/2022 exploratory laparotomy with small bowel resection, appendectomy and small bowel-small bowel anastomosis-benign appendix with periappendiceal subacute and chronic hemorrhagic serosal reaction, no evidence of carcinoid/neuroendocrine tumor Decreased chromogranin A 03/21/2022 MRI abdomen 05/23/2022-overall decrease in size of innumerable bilobar hepatic metastases, no new hepatic lesions, postsurgical changes with interval resection of the central mesenteric mass Dotatate PET 06/11/2022-decrease in number of radiotracer avid metastases in the liver, remaining metastatic lesions in the right liver, interval resection of  central mesenteric mass with partial small bowel resection, no mesenteric or small bowel lesions present MRI abdomen 08/30/2022-innumerable by lobar hepatic metastases, mildly improved from 05/23/2022 MRI Dotatate PET 10/18/2022-compared to 11/20/2021, similar appearance of right hepatic lobe and caudate avid metastatic disease with exception of a new focus of the posterior right hepatic lobe, new SSR positive nonenlarged left paratracheal node Lanreotide continued MRI abdomen 01/28/2023-innumerable liver lesions, stable to slightly decreased in size.  No progressive disease Lanreotide continued CT chest 04/16/2023: Small mediastinal lymph nodes are not pathologically enlarged, slightly larger than images from January 2024, 7 mm groundglass right upper lobe nodule-indeterminate, left paratracheal hypermetabolic node on 4/83/7975 PET measures 6 mm compared to 5 mm in May Dotatate PET 10/02/2023: Stable hepatic metastases, no disease progression, no evidence of thoracic metastases-small left paratracheal radiotracer avid node is no longer radiotracer avid with a potential tiny remaining node Lanreotide continued Histotripsy in  [place]  12/27/2023 MRI abdomen 02/10/2024: Extensive hepatic metastatic disease, new small contrast-enhancing lesions with an increase in  enhancing soft tissue associated with treated lesions 03/31/2024 right liver bland embolization 04/14/2024 chromogranin A 213.9-improved 04/21/2024 MRI abdomen: Improvement in diffusion restriction throughout the liver with new central nonenhancement of many lesions consistent with a treatment response History of MI/stent placement CAD Hypertension Renal insufficiency Hypothyroidism diagnosed with markedly elevated TSH and low T4 on 12/18/2018 Acute left elbow/right ankle pain and swelling August 2021 February 2023-acute renal failure following liver embolization procedure, improved      Disposition: William Hancock has metastatic carcinoid tumor.  He underwent bland embolization of the right liver in [place]  on 03/31/2024.  He tolerated the procedure well.  The chromogranin A is lower and a restaging MRI of the liver is consistent with a treatment response.  He will continue monthly lanreotide.  He is scheduled for follow-up with Dr. Maluccio next week.  Mr. Redmann will return for an office visit and chromogranin A level in 3 months.  I reviewed the MRI findings and images with him.  Arley Hof, MD  04/22/2024  9:29 AM

## 2024-04-27 ENCOUNTER — Encounter: Payer: Self-pay | Admitting: *Deleted

## 2024-04-27 NOTE — Progress Notes (Signed)
 Faxed MRI report, office note and labs to Dr. Ronal Hobbs-Maluccio [phone]. Also faxed request to [facility] to push over the images of the 11/18 MRI to [facility].

## 2024-04-28 ENCOUNTER — Ambulatory Visit (HOSPITAL_COMMUNITY): Admission: RE | Admit: 2024-04-28 | Source: Ambulatory Visit

## 2024-04-28 ENCOUNTER — Encounter: Payer: Self-pay | Admitting: *Deleted

## 2024-04-28 DIAGNOSIS — E039 Hypothyroidism, unspecified: Secondary | ICD-10-CM | POA: Diagnosis not present

## 2024-04-28 DIAGNOSIS — N1831 Chronic kidney disease, stage 3a: Secondary | ICD-10-CM | POA: Diagnosis not present

## 2024-04-28 DIAGNOSIS — E7800 Pure hypercholesterolemia, unspecified: Secondary | ICD-10-CM | POA: Diagnosis not present

## 2024-04-28 DIAGNOSIS — C7A8 Other malignant neuroendocrine tumors: Secondary | ICD-10-CM | POA: Diagnosis not present

## 2024-04-28 DIAGNOSIS — Z0000 Encounter for general adult medical examination without abnormal findings: Secondary | ICD-10-CM | POA: Diagnosis not present

## 2024-04-28 DIAGNOSIS — I10 Essential (primary) hypertension: Secondary | ICD-10-CM | POA: Diagnosis not present

## 2024-04-30 ENCOUNTER — Encounter: Payer: Self-pay | Admitting: Cardiology

## 2024-04-30 DIAGNOSIS — I253 Aneurysm of heart: Secondary | ICD-10-CM

## 2024-05-04 NOTE — Telephone Encounter (Signed)
 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  I have not noticed this on previous echocardiograms.  We could perform a cardiac MRI to be absolutely sure about this finding.  Regardless, this may represent prior injury and may not necessarily need any new change in management.  Thanks MJP

## 2024-05-18 ENCOUNTER — Encounter (HOSPITAL_COMMUNITY): Payer: Self-pay

## 2024-05-19 ENCOUNTER — Ambulatory Visit (HOSPITAL_COMMUNITY): Admission: RE | Admit: 2024-05-19 | Discharge: 2024-05-19 | Attending: Cardiology

## 2024-05-19 ENCOUNTER — Other Ambulatory Visit: Payer: Self-pay | Admitting: Cardiology

## 2024-05-19 ENCOUNTER — Ambulatory Visit (HOSPITAL_COMMUNITY)

## 2024-05-19 DIAGNOSIS — I253 Aneurysm of heart: Secondary | ICD-10-CM | POA: Insufficient documentation

## 2024-05-19 MED ORDER — GADOBUTROL 1 MMOL/ML IV SOLN
12.0000 mL | Freq: Once | INTRAVENOUS | Status: AC | PRN
  Administered 2024-05-19: 12:00:00 12 mL via INTRAVENOUS

## 2024-05-20 ENCOUNTER — Ambulatory Visit: Payer: Self-pay | Admitting: Cardiology

## 2024-05-20 ENCOUNTER — Inpatient Hospital Stay: Attending: Nurse Practitioner

## 2024-05-20 VITALS — BP 150/86 | HR 63 | Temp 98.0°F | Resp 18

## 2024-05-20 DIAGNOSIS — C7B02 Secondary carcinoid tumors of liver: Secondary | ICD-10-CM | POA: Insufficient documentation

## 2024-05-20 DIAGNOSIS — D3A098 Benign carcinoid tumors of other sites: Secondary | ICD-10-CM

## 2024-05-20 DIAGNOSIS — C7A098 Malignant carcinoid tumors of other sites: Secondary | ICD-10-CM | POA: Diagnosis not present

## 2024-05-20 DIAGNOSIS — C7B04 Secondary carcinoid tumors of peritoneum: Secondary | ICD-10-CM | POA: Diagnosis not present

## 2024-05-20 MED ORDER — LANREOTIDE ACETATE 120 MG/0.5ML ~~LOC~~ SOLN
120.0000 mg | Freq: Once | SUBCUTANEOUS | Status: AC
  Administered 2024-05-20: 09:00:00 120 mg via SUBCUTANEOUS
  Filled 2024-05-20: qty 0.5

## 2024-05-20 NOTE — Patient Instructions (Signed)
 Lanreotide Injection What is this medication? LANREOTIDE (lan REE oh tide) treats high levels of growth hormone (acromegaly). It is used when other therapies have not worked well enough or cannot be tolerated. It works by reducing the amount of growth hormone your body makes. This reduces symptoms and the risk of health problems caused by too much growth hormone, such as diabetes and heart disease. It may also be used to treat neuroendocrine tumors, a cancer of the cells that release hormones and other substances in your body. It works by slowing down the release of these substances from the cells. This slows tumor growth. It also decreases the symptoms of carcinoid syndrome, such as flushing or diarrhea. This medicine may be used for other purposes; ask your health care provider or pharmacist if you have questions. COMMON BRAND NAME(S): Somatuline [place] What should I tell my care team before I take this medication? They need to know if you have any of these conditions: Diabetes Gallbladder disease Heart disease Kidney disease Liver disease Pancreatic disease Thyroid disease An unusual or allergic reaction to lanreotide, other medications, foods, dyes, or preservatives Pregnant or trying to get pregnant Breastfeeding How should I use this medication? This medication is injected under the skin. It is given by your care team in a hospital or clinic setting. Talk to your care team about the use of this medication in children. Special care may be needed. Overdosage: If you think you have taken too much of this medicine contact a poison control center or emergency room at once. NOTE: This medicine is only for you. Do not share this medicine with others. What if I miss a dose? Keep appointments for follow-up doses. It is important not to miss your dose. Call your care team if you are unable to keep an appointment. What may interact with this medication? Bromocriptine Cyclosporine Certain  medications for blood pressure, heart disease, irregular heartbeat Certain medications for diabetes Quinidine Terfenadine This list may not describe all possible interactions. Give your health care provider a list of all the medicines, herbs, non-prescription drugs, or dietary supplements you use. Also tell them if you smoke, drink alcohol, or use illegal drugs. Some items may interact with your medicine. What should I watch for while using this medication? Visit your care team for regular checks on your progress. Tell your care team if your symptoms do not start to get better or if they get worse. Your condition will be monitored carefully while you are receiving this medication. You may need blood work while you are taking this medication. This medication may increase blood sugar. The risk may be higher in patients who already have diabetes. Ask your care team what you can do to lower your risk of diabetes while taking this medication. Talk to your care team if you wish to become pregnant or think you may be pregnant. This medication can cause serious birth defects. Do not breast-feed while taking this medication and for 6 months after stopping therapy. This medication may cause infertility. Talk to your care team if you are concerned about your fertility. What side effects may I notice from receiving this medication? Side effects that you should report to your care team as soon as possible: Allergic reactions--skin rash, itching, hives, swelling of the face, lips, tongue, or throat Gallbladder problems--severe stomach pain, nausea, vomiting, fever High blood sugar (hyperglycemia)--increased thirst or amount of urine, unusual weakness or fatigue, blurry vision Increase in blood pressure Low blood sugar (hypoglycemia)--pale, blue or purple  skin or lips, sweating, fussiness, rapid heartbeat, poor feeding, low body temperature Low thyroid levels (hypothyroidism)--unusual weakness or fatigue,  increased sensitivity to cold, constipation, hair loss, dry skin, weight gain, feelings of depression Oily or light-colored stools, diarrhea, bloating, weight loss Slow heartbeat--dizziness, feeling faint or lightheaded, confusion, trouble breathing, unusual weakness or fatigue Side effects that usually do not require medical attention (report these to your care team if they continue or are bothersome): Diarrhea Dizziness Headache Muscle spasms Nausea Pain, redness, or irritation at injection site Stomach pain This list may not describe all possible side effects. Call your doctor for medical advice about side effects. You may report side effects to FDA at 1-800-FDA-1088. Where should I keep my medication? This medication is given in a hospital or clinic. It will not be stored at home. NOTE: This sheet is a summary. It may not cover all possible information. If you have questions about this medicine, talk to your doctor, pharmacist, or health care provider.  2024 Elsevier/Gold Standard (2023-05-03 00:00:00)

## 2024-05-20 NOTE — Progress Notes (Signed)
 Reviewed MRI results with the patient.  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  Incidental findings with no clinical change.  Continue current medical treatment.  Newman JINNY Lawrence, MD

## 2024-05-22 ENCOUNTER — Other Ambulatory Visit: Payer: Self-pay | Admitting: *Deleted

## 2024-05-22 ENCOUNTER — Encounter: Payer: Self-pay | Admitting: *Deleted

## 2024-05-22 DIAGNOSIS — D3A098 Benign carcinoid tumors of other sites: Secondary | ICD-10-CM

## 2024-05-22 DIAGNOSIS — C7B02 Secondary carcinoid tumors of liver: Secondary | ICD-10-CM

## 2024-05-22 NOTE — Progress Notes (Signed)
 Dr. Ralene has ordered MRI abdomen in Feb 2026 with serotonin level. Orders placed and MyChart message to William Hancock to let him know orders are in and he can call to schedule if he prefers to do so.

## 2024-05-26 DIAGNOSIS — D1801 Hemangioma of skin and subcutaneous tissue: Secondary | ICD-10-CM | POA: Diagnosis not present

## 2024-05-26 DIAGNOSIS — L570 Actinic keratosis: Secondary | ICD-10-CM | POA: Diagnosis not present

## 2024-06-18 ENCOUNTER — Inpatient Hospital Stay: Attending: Nurse Practitioner

## 2024-06-18 VITALS — BP 148/89 | HR 58 | Temp 97.9°F | Resp 18

## 2024-06-18 DIAGNOSIS — C7A098 Malignant carcinoid tumors of other sites: Secondary | ICD-10-CM | POA: Insufficient documentation

## 2024-06-18 DIAGNOSIS — C7B04 Secondary carcinoid tumors of peritoneum: Secondary | ICD-10-CM | POA: Diagnosis present

## 2024-06-18 DIAGNOSIS — C7B02 Secondary carcinoid tumors of liver: Secondary | ICD-10-CM | POA: Diagnosis present

## 2024-06-18 DIAGNOSIS — D3A098 Benign carcinoid tumors of other sites: Secondary | ICD-10-CM

## 2024-06-18 MED ORDER — LANREOTIDE ACETATE 120 MG/0.5ML ~~LOC~~ SOLN
120.0000 mg | Freq: Once | SUBCUTANEOUS | Status: AC
  Administered 2024-06-18: 120 mg via SUBCUTANEOUS

## 2024-06-18 NOTE — Patient Instructions (Signed)
 Lanreotide Injection What is this medication? LANREOTIDE (lan REE oh tide) treats high levels of growth hormone (acromegaly). It is used when other therapies have not worked well enough or cannot be tolerated. It works by reducing the amount of growth hormone your body makes. This reduces symptoms and the risk of health problems caused by too much growth hormone, such as diabetes and heart disease. It may also be used to treat neuroendocrine tumors, a cancer of the cells that release hormones and other substances in your body. It works by slowing down the release of these substances from the cells. This slows tumor growth. It also decreases the symptoms of carcinoid syndrome, such as flushing or diarrhea. This medicine may be used for other purposes; ask your health care provider or pharmacist if you have questions. COMMON BRAND NAME(S): Somatuline [place] What should I tell my care team before I take this medication? They need to know if you have any of these conditions: Diabetes Gallbladder disease Heart disease Kidney disease Liver disease Pancreatic disease Thyroid disease An unusual or allergic reaction to lanreotide, other medications, foods, dyes, or preservatives Pregnant or trying to get pregnant Breastfeeding How should I use this medication? This medication is injected under the skin. It is given by your care team in a hospital or clinic setting. Talk to your care team about the use of this medication in children. Special care may be needed. Overdosage: If you think you have taken too much of this medicine contact a poison control center or emergency room at once. NOTE: This medicine is only for you. Do not share this medicine with others. What if I miss a dose? Keep appointments for follow-up doses. It is important not to miss your dose. Call your care team if you are unable to keep an appointment. What may interact with this medication? Bromocriptine Cyclosporine Certain  medications for blood pressure, heart disease, irregular heartbeat Certain medications for diabetes Quinidine Terfenadine This list may not describe all possible interactions. Give your health care provider a list of all the medicines, herbs, non-prescription drugs, or dietary supplements you use. Also tell them if you smoke, drink alcohol, or use illegal drugs. Some items may interact with your medicine. What should I watch for while using this medication? Visit your care team for regular checks on your progress. Tell your care team if your symptoms do not start to get better or if they get worse. Your condition will be monitored carefully while you are receiving this medication. You may need blood work while you are taking this medication. This medication may increase blood sugar. The risk may be higher in patients who already have diabetes. Ask your care team what you can do to lower your risk of diabetes while taking this medication. Talk to your care team if you wish to become pregnant or think you may be pregnant. This medication can cause serious birth defects. Do not breast-feed while taking this medication and for 6 months after stopping therapy. This medication may cause infertility. Talk to your care team if you are concerned about your fertility. What side effects may I notice from receiving this medication? Side effects that you should report to your care team as soon as possible: Allergic reactions--skin rash, itching, hives, swelling of the face, lips, tongue, or throat Gallbladder problems--severe stomach pain, nausea, vomiting, fever High blood sugar (hyperglycemia)--increased thirst or amount of urine, unusual weakness or fatigue, blurry vision Increase in blood pressure Low blood sugar (hypoglycemia)--pale, blue or purple  skin or lips, sweating, fussiness, rapid heartbeat, poor feeding, low body temperature Low thyroid levels (hypothyroidism)--unusual weakness or fatigue,  increased sensitivity to cold, constipation, hair loss, dry skin, weight gain, feelings of depression Oily or light-colored stools, diarrhea, bloating, weight loss Slow heartbeat--dizziness, feeling faint or lightheaded, confusion, trouble breathing, unusual weakness or fatigue Side effects that usually do not require medical attention (report these to your care team if they continue or are bothersome): Diarrhea Dizziness Headache Muscle spasms Nausea Pain, redness, or irritation at injection site Stomach pain This list may not describe all possible side effects. Call your doctor for medical advice about side effects. You may report side effects to FDA at 1-800-FDA-1088. Where should I keep my medication? This medication is given in a hospital or clinic. It will not be stored at home. NOTE: This sheet is a summary. It may not cover all possible information. If you have questions about this medicine, talk to your doctor, pharmacist, or health care provider.  2024 Elsevier/Gold Standard (2023-05-03 00:00:00)

## 2024-07-08 ENCOUNTER — Ambulatory Visit (HOSPITAL_COMMUNITY)
Admission: RE | Admit: 2024-07-08 | Discharge: 2024-07-08 | Disposition: A | Source: Ambulatory Visit | Attending: Oncology | Admitting: Oncology

## 2024-07-08 DIAGNOSIS — C7B02 Secondary carcinoid tumors of liver: Secondary | ICD-10-CM

## 2024-07-08 MED ORDER — GADOBUTROL 1 MMOL/ML IV SOLN
9.0000 mL | Freq: Once | INTRAVENOUS | Status: AC | PRN
  Administered 2024-07-08: 9 mL via INTRAVENOUS

## 2024-07-16 ENCOUNTER — Inpatient Hospital Stay

## 2024-07-16 ENCOUNTER — Inpatient Hospital Stay: Admitting: Oncology
